# Patient Record
Sex: Female | Born: 1955 | Race: White | Hispanic: No | Marital: Married | Smoking: Current every day smoker
Health system: Southern US, Community
[De-identification: ages and names within clinical notes are randomized; demographics above are authoritative.]

## PROBLEM LIST (undated history)

## (undated) DIAGNOSIS — D649 Anemia, unspecified: Secondary | ICD-10-CM

## (undated) DIAGNOSIS — F329 Major depressive disorder, single episode, unspecified: Secondary | ICD-10-CM

## (undated) DIAGNOSIS — E7800 Pure hypercholesterolemia, unspecified: Secondary | ICD-10-CM

## (undated) DIAGNOSIS — I509 Heart failure, unspecified: Secondary | ICD-10-CM

## (undated) DIAGNOSIS — F32A Depression, unspecified: Secondary | ICD-10-CM

## (undated) DIAGNOSIS — Z5189 Encounter for other specified aftercare: Secondary | ICD-10-CM

## (undated) DIAGNOSIS — T7840XA Allergy, unspecified, initial encounter: Secondary | ICD-10-CM

## (undated) DIAGNOSIS — K219 Gastro-esophageal reflux disease without esophagitis: Secondary | ICD-10-CM

## (undated) DIAGNOSIS — J439 Emphysema, unspecified: Secondary | ICD-10-CM

## (undated) DIAGNOSIS — H269 Unspecified cataract: Secondary | ICD-10-CM

## (undated) DIAGNOSIS — Z8489 Family history of other specified conditions: Secondary | ICD-10-CM

## (undated) DIAGNOSIS — F419 Anxiety disorder, unspecified: Secondary | ICD-10-CM

## (undated) DIAGNOSIS — R0600 Dyspnea, unspecified: Secondary | ICD-10-CM

## (undated) DIAGNOSIS — G709 Myoneural disorder, unspecified: Secondary | ICD-10-CM

## (undated) DIAGNOSIS — M1990 Unspecified osteoarthritis, unspecified site: Secondary | ICD-10-CM

## (undated) DIAGNOSIS — J45909 Unspecified asthma, uncomplicated: Secondary | ICD-10-CM

## (undated) HISTORY — DX: Unspecified osteoarthritis, unspecified site: M19.90

## (undated) HISTORY — DX: Unspecified asthma, uncomplicated: J45.909

## (undated) HISTORY — DX: Allergy, unspecified, initial encounter: T78.40XA

## (undated) HISTORY — DX: Anemia, unspecified: D64.9

## (undated) HISTORY — PX: DE QUERVAIN'S RELEASE: SHX1439

## (undated) HISTORY — DX: Myoneural disorder, unspecified: G70.9

## (undated) HISTORY — DX: Unspecified cataract: H26.9

## (undated) HISTORY — DX: Gastro-esophageal reflux disease without esophagitis: K21.9

## (undated) HISTORY — DX: Encounter for other specified aftercare: Z51.89

## (undated) HISTORY — DX: Heart failure, unspecified: I50.9

## (undated) HISTORY — DX: Pure hypercholesterolemia, unspecified: E78.00

## (undated) HISTORY — DX: Emphysema, unspecified: J43.9

## (undated) HISTORY — PX: MOUTH SURGERY: SHX715

## (undated) SURGERY — EGD (ESOPHAGOGASTRODUODENOSCOPY)
Anesthesia: Moderate Sedation

## (undated) SURGERY — COLONOSCOPY
Anesthesia: Moderate Sedation

---
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
## 1981-02-28 HISTORY — PX: TUBAL LIGATION: SHX77

## 1998-07-23 HISTORY — PX: TOTAL ABDOMINAL HYSTERECTOMY W/ BILATERAL SALPINGOOPHORECTOMY: SHX83

## 1998-12-31 ENCOUNTER — Ambulatory Visit (HOSPITAL_BASED_OUTPATIENT_CLINIC_OR_DEPARTMENT_OTHER): Admission: RE | Admit: 1998-12-31 | Discharge: 1998-12-31 | Payer: Self-pay | Admitting: Orthopedic Surgery

## 2003-04-22 HISTORY — PX: TOE SURGERY: SHX1073

## 2004-07-15 ENCOUNTER — Encounter: Admission: RE | Admit: 2004-07-15 | Discharge: 2004-07-15 | Payer: Self-pay | Admitting: Orthopedic Surgery

## 2005-08-18 ENCOUNTER — Ambulatory Visit: Payer: Self-pay | Admitting: Cardiology

## 2005-08-22 ENCOUNTER — Ambulatory Visit: Payer: Self-pay | Admitting: Cardiology

## 2006-06-24 ENCOUNTER — Ambulatory Visit (HOSPITAL_BASED_OUTPATIENT_CLINIC_OR_DEPARTMENT_OTHER): Admission: RE | Admit: 2006-06-24 | Discharge: 2006-06-24 | Payer: Self-pay | Admitting: Orthopedic Surgery

## 2006-06-24 ENCOUNTER — Encounter (INDEPENDENT_AMBULATORY_CARE_PROVIDER_SITE_OTHER): Payer: Self-pay | Admitting: *Deleted

## 2008-08-21 ENCOUNTER — Encounter: Admission: RE | Admit: 2008-08-21 | Discharge: 2008-08-21 | Payer: Self-pay | Admitting: Surgery

## 2009-06-21 HISTORY — PX: OTHER SURGICAL HISTORY: SHX169

## 2009-09-13 ENCOUNTER — Encounter: Admission: RE | Admit: 2009-09-13 | Discharge: 2009-09-13 | Payer: Self-pay | Admitting: Orthopedic Surgery

## 2009-09-19 ENCOUNTER — Ambulatory Visit: Payer: Self-pay | Admitting: Cardiology

## 2010-01-11 ENCOUNTER — Encounter: Admission: RE | Admit: 2010-01-11 | Discharge: 2010-01-11 | Payer: Self-pay | Admitting: Otolaryngology

## 2010-05-10 ENCOUNTER — Ambulatory Visit
Admission: RE | Admit: 2010-05-10 | Discharge: 2010-05-10 | Payer: Self-pay | Source: Home / Self Care | Attending: Urology | Admitting: Urology

## 2010-05-10 HISTORY — PX: OTHER SURGICAL HISTORY: SHX169

## 2010-05-13 ENCOUNTER — Encounter: Payer: Self-pay | Admitting: Surgery

## 2010-05-13 LAB — POCT HEMOGLOBIN-HEMACUE: Hemoglobin: 16.3 g/dL — ABNORMAL HIGH (ref 12.0–15.0)

## 2010-05-17 NOTE — Op Note (Signed)
  NAME:  HENDEL, GATLIFF NO.:  [id number]  MEDICAL RECORD NO.:  [id number]          PATIENT TYPE:  AMB  LOCATION:  [facility]                         FACILITY:  [facility]  PHYSICIAN:  Maretta Bees. Vonita Moss, M.D.DATE OF BIRTH:  Jul 06, 1955  DATE OF PROCEDURE:  05/10/2010 DATE OF DISCHARGE:                              OPERATIVE REPORT   PREOPERATIVE DIAGNOSIS:  Rule out interstitial cystitis.  POSTOPERATIVE DIAGNOSIS:  Interstitial cystitis.  PROCEDURE:  Cystoscopy, HOD, and cold cup bladder biopsy.  SURGEON:  Maretta Bees. Vonita Moss, M.D.  ANESTHESIA:  General.  INDICATIONS:  This 55 year old lady has a long history of frequency and urgency of urination.  The symptoms have become worse in the last few years.  Her pelvic pain and symptom score was 22.  She has not responded to anticholinergics.  I am suspicious of interstitial cystitis.  She was advised about postop pain, bleeding, and rare risk of bladder rupture.  DESCRIPTION OF PROCEDURE:  The patient was brought to the operating room and placed in lithotomy position.  External genitalia were prepped and draped in the usual fashion.  She was cystoscoped and the bladder was unremarkable.  She was filled to 500 cc and looking back in she had scattered submucosal petechiae and hemorrhage consistent with interstitial cystitis.  Cold cup bladder biopsies were taken from 4 areas with typical hemorrhagic lesions.  The biopsy sites were fulgurated with the Bugbee electrode.  At this point, the bladder was emptied and the scope removed and the patient was sent to recovery room in good condition having tolerated the procedure well.     Maretta Bees. Vonita Moss, M.D.     LJP/MEDQ  D:  05/10/2010  T:  05/10/2010  Job:  [id number]  Electronically Signed by Larey Dresser M.D. on 05/15/2010 11:35:22 AM

## 2010-09-06 NOTE — Op Note (Signed)
NAME:  Yvette Curry, Yvette Curry NO.:  [id number]   MEDICAL RECORD NO.:  [id number]          PATIENT TYPE:  AMB   LOCATION:  [facility]                          FACILITY:  [facility]   PHYSICIAN:  Artist Pais. 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   PROCEDURE:  Left wrist first dorsal compartment release with excision of  left wrist dorsal mass.   SURGEON:  Weingold.   ASSISTANT:  None.   ANESTHESIA:  Regional.   TOURNIQUET TIME:  35 minutes.   COMPLICATIONS:  None.   DRAINS:  None.   OPERATIVE REPORT:  The patient was taken to the operating suite.  After  the induction of adequate IV regional anesthetic, the left upper  extremity was prepped and draped in the usual sterile fashion.  Once  this was done, a JC incision was made over the first dorsal compartment  extending distally to the midline over a palpable mass of the area of  Lister tubercle.  The skin was incised.  Flaps were raised accordingly.  The first dorsal compartment was identified.  It was incised at its  dorsal-most extent after careful __________ retracting the superficial  radial nerve.  After this was completed, dissection was carried down to  the radial aspect where a small lesion was seen coming off the EPL  sheath in the area of Lister tubercle.  This was excised.  The wound was  irrigated and loosely closed with 3-0 Prolene subcuticular stitch.  Steri-Strips, 4 x 4s, fluffs, compressive dressing, and radial gutter  splint was applied.  The patient tolerated the above procedure well and  was returned to recovery room in stable fashion.      Artist Pais Mina Marble, M.D.  Electronically Signed     MAW/MEDQ  D:  06/24/2006  T:  06/24/2006  Job:  [id number]

## 2010-11-05 ENCOUNTER — Encounter: Payer: Self-pay | Admitting: Pulmonary Disease

## 2010-11-06 ENCOUNTER — Ambulatory Visit (INDEPENDENT_AMBULATORY_CARE_PROVIDER_SITE_OTHER): Payer: 59 | Admitting: Pulmonary Disease

## 2010-11-06 ENCOUNTER — Encounter: Payer: Self-pay | Admitting: Pulmonary Disease

## 2010-11-06 VITALS — BP 136/92 | HR 110 | Temp 97.8°F | Ht 61.0 in | Wt 172.0 lb

## 2010-11-06 DIAGNOSIS — R911 Solitary pulmonary nodule: Secondary | ICD-10-CM | POA: Insufficient documentation

## 2010-11-06 DIAGNOSIS — J984 Other disorders of lung: Secondary | ICD-10-CM

## 2010-11-06 DIAGNOSIS — R05 Cough: Secondary | ICD-10-CM

## 2010-11-06 DIAGNOSIS — R059 Cough, unspecified: Secondary | ICD-10-CM | POA: Insufficient documentation

## 2010-11-06 NOTE — Progress Notes (Signed)
  Subjective:    Patient ID: Yvette Curry, female    DOB: 1955/05/26, 55 y.o.   MRN: [id number]  HPI The pt is a 54y/o female who comes in as a self referral for management of cough.  She states she has had a cough for 3-62mos, and primarily occurs at night on lying down.  It also occurs during the night, and can awaken her from sleep.  She denies any postnasal drip, but does have breakthru reflux symptoms on PPI qday.  She has been seen by ENT, and tells me she has had a negative upper airway exam.  She denies chronic throat clearing, but does feel that food sometimes gets stuck in her throat.  She has had a chest ct last year which showed a 3mm nodule, but no other pulmonary abnormality.  She is due for a f/u scan.  She does have a long h/o smoking, and continues to do so.  She has never had pfts.   Review of Systems  Constitutional: Negative for fever and unexpected weight change.  HENT: Positive for trouble swallowing. Negative for ear pain, nosebleeds, congestion, sore throat, rhinorrhea, sneezing, dental problem, postnasal drip and sinus pressure.   Eyes: Negative for redness and itching.  Respiratory: Positive for cough and shortness of breath. Negative for chest tightness and wheezing.   Cardiovascular: Negative for palpitations and leg swelling.  Gastrointestinal: Negative for nausea and vomiting.  Genitourinary: Negative for dysuria.  Musculoskeletal: Negative for joint swelling.  Skin: Negative for rash.  Neurological: Negative for headaches.  Hematological: Does not bruise/bleed easily.  Psychiatric/Behavioral: Negative for dysphoric mood. The patient is not nervous/anxious.        Objective:   Physical Exam Constitutional:  Obese female, no acute distress  HENT:  Nares patent without discharge  Oropharynx without exudate, palate and uvula are normal  Eyes:  Perrla, eomi, no scleral icterus  Neck:  No JVD, no TMG  Cardiovascular:  Normal rate, regular rhythm, no rubs or  gallops.  No murmurs        Intact distal pulses  Pulmonary :  Normal breath sounds, no stridor or respiratory distress   No rales, rhonchi, or wheezing  Abdominal:  Soft, nondistended, bowel sounds present.  No tenderness noted.   Musculoskeletal:  No lower extremity edema noted.  Lymph Nodes:  No cervical lymphadenopathy noted  Skin:  No cyanosis noted  Neurologic:  Alert, appropriate, moves all 4 extremities without obvious deficit.         Assessment & Plan:

## 2010-11-06 NOTE — Patient Instructions (Signed)
Will schedule for repeat ct chest to followup your nodule.  Will call with results. Stop smoking! This is very important.   Increase protonix to twice a day dosing for next 3 weeks, am and pm. Consider adding chlorpheniramine 8mg  (over the counter) at bedtime for next few weeks to also treat postnasal drip/allergies. Call me in 3 weeks with how you have responded to the above changes.

## 2010-11-09 ENCOUNTER — Encounter: Payer: Self-pay | Admitting: Pulmonary Disease

## 2010-11-09 NOTE — Assessment & Plan Note (Signed)
The pt's cough is most suggestive of an upper airway source, with LPR being the most likely.  I also think her ongoing smoking aggravates things.  I would like to treat her more aggressively for reflux, and also encouraged her to work hard on smoking cessation.  She will also need a f/u ct chest for her nodule.

## 2010-11-28 ENCOUNTER — Telehealth: Payer: Self-pay | Admitting: Pulmonary Disease

## 2010-11-28 NOTE — Telephone Encounter (Signed)
KC, have you seen CT Chest results yet? It was done at [facility]. Please advise, thanks!

## 2010-11-29 NOTE — Telephone Encounter (Signed)
Let her know that her spot in top of left lung that we were watching the most has decreased in size. The other tiny spots have not changed.  The radiologist has recommended one more followup in one year.  I concur.  I will put a reminder in the computer, but she needs to call us this time next year for f/u.  See if her cough is better.

## 2010-11-29 NOTE — Telephone Encounter (Signed)
I called and asked to get results faxed to triage fax. Will await fax.Carron Curie, CMA

## 2010-11-29 NOTE — Telephone Encounter (Signed)
Form is in [facility] look-at. Carron Curie, CMA

## 2010-11-29 NOTE — Telephone Encounter (Signed)
Have not seen it.  Those do not come into epic, and we typically have to wait until sent by mail or faxed.  To expedite things, please have them fax over the report and give to me today to call pt.  Thanks.

## 2010-12-02 NOTE — Telephone Encounter (Signed)
Spoke with Yvette Curry and she is aware of ct results. Yvette Curry verbalized understanding and had no questions. Yvette Curry states her cough is no better. Yvette Curry states she is getting up very little phlem and it ranges in color from clear to tan. Yvette Curry has been taking otc chlorpheniramine and it has not helped any. Please advise Dr. Shelle Iron. Thanks

## 2010-12-02 NOTE — Telephone Encounter (Signed)
See if she is still smoking. If she still is, let her know that I am happy to see her again to address cough, but the usual first order of business is to stop smoking.  The cough may not ever go away until she does so.

## 2010-12-02 NOTE — Telephone Encounter (Signed)
Pt is still smoking and says she is not ready to stop. She was made aware that per Dr. Shelle Iron her cough may not ever go away until she takes that step. She did not want to make an appointment at this time and I will forward to [facility] so he is aware.

## 2010-12-03 ENCOUNTER — Encounter: Payer: Self-pay | Admitting: Pulmonary Disease

## 2013-07-08 ENCOUNTER — Other Ambulatory Visit: Payer: Self-pay | Admitting: Endocrinology

## 2013-07-08 DIAGNOSIS — R1310 Dysphagia, unspecified: Secondary | ICD-10-CM

## 2013-07-18 ENCOUNTER — Ambulatory Visit
Admission: RE | Admit: 2013-07-18 | Discharge: 2013-07-18 | Disposition: A | Discharge status: Home / Self Care | Payer: 59 | Source: Ambulatory Visit | Attending: Endocrinology | Admitting: Endocrinology

## 2013-07-18 ENCOUNTER — Other Ambulatory Visit: Payer: Self-pay | Admitting: Endocrinology

## 2013-07-18 DIAGNOSIS — R1310 Dysphagia, unspecified: Secondary | ICD-10-CM

## 2013-07-18 MED ORDER — IOHEXOL 300 MG/ML  SOLN
75.0000 mL | Freq: Once | INTRAMUSCULAR | Status: AC | PRN
  Administered 2013-07-18: 75 mL via INTRAVENOUS

## 2014-06-13 ENCOUNTER — Encounter (INDEPENDENT_AMBULATORY_CARE_PROVIDER_SITE_OTHER): Payer: Self-pay

## 2014-06-13 ENCOUNTER — Encounter (INDEPENDENT_AMBULATORY_CARE_PROVIDER_SITE_OTHER): Payer: Self-pay | Admitting: *Deleted

## 2014-11-06 DIAGNOSIS — Z720 Tobacco use: Secondary | ICD-10-CM | POA: Insufficient documentation

## 2016-05-12 DIAGNOSIS — R1314 Dysphagia, pharyngoesophageal phase: Secondary | ICD-10-CM | POA: Diagnosis not present

## 2016-05-12 DIAGNOSIS — R0602 Shortness of breath: Secondary | ICD-10-CM | POA: Diagnosis not present

## 2016-05-12 DIAGNOSIS — Z720 Tobacco use: Secondary | ICD-10-CM | POA: Diagnosis not present

## 2016-05-19 ENCOUNTER — Encounter (INDEPENDENT_AMBULATORY_CARE_PROVIDER_SITE_OTHER): Payer: Self-pay | Admitting: Internal Medicine

## 2016-05-19 ENCOUNTER — Encounter (INDEPENDENT_AMBULATORY_CARE_PROVIDER_SITE_OTHER): Payer: Self-pay

## 2016-05-27 ENCOUNTER — Encounter (INDEPENDENT_AMBULATORY_CARE_PROVIDER_SITE_OTHER): Payer: Self-pay | Admitting: Internal Medicine

## 2016-05-27 ENCOUNTER — Ambulatory Visit (INDEPENDENT_AMBULATORY_CARE_PROVIDER_SITE_OTHER): Payer: 59 | Admitting: Internal Medicine

## 2016-05-27 ENCOUNTER — Telehealth (INDEPENDENT_AMBULATORY_CARE_PROVIDER_SITE_OTHER): Payer: Self-pay | Admitting: *Deleted

## 2016-05-27 ENCOUNTER — Other Ambulatory Visit (INDEPENDENT_AMBULATORY_CARE_PROVIDER_SITE_OTHER): Payer: Self-pay | Admitting: Internal Medicine

## 2016-05-27 ENCOUNTER — Encounter (INDEPENDENT_AMBULATORY_CARE_PROVIDER_SITE_OTHER): Payer: Self-pay | Admitting: *Deleted

## 2016-05-27 VITALS — BP 120/72 | HR 64 | Temp 98.2°F | Ht 61.0 in | Wt 158.2 lb

## 2016-05-27 DIAGNOSIS — R1319 Other dysphagia: Secondary | ICD-10-CM

## 2016-05-27 DIAGNOSIS — R1310 Dysphagia, unspecified: Secondary | ICD-10-CM | POA: Diagnosis not present

## 2016-05-27 DIAGNOSIS — Z860100 Personal history of colon polyps, unspecified: Secondary | ICD-10-CM

## 2016-05-27 DIAGNOSIS — K219 Gastro-esophageal reflux disease without esophagitis: Secondary | ICD-10-CM

## 2016-05-27 MED ORDER — PANTOPRAZOLE SODIUM 40 MG PO TBEC: 40.0000 mg | DELAYED_RELEASE_TABLET | Freq: Every day | ORAL | 3 refills | Status: DC

## 2016-05-27 MED ORDER — PEG 3350-KCL-NA BICARB-NACL 420 G PO SOLR: 4000.0000 mL | Freq: Once | ORAL | 0 refills | Status: AC
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
## 2016-05-27 NOTE — Telephone Encounter (Signed)
Patient needs trilyte 

## 2016-05-27 NOTE — Patient Instructions (Addendum)
EGD/ED.  The risks and benefits such as perforation, bleeding, and infection were reviewed with the patient and is agreeable. Colonoscopy.

## 2016-05-28 DIAGNOSIS — R1310 Dysphagia, unspecified: Secondary | ICD-10-CM | POA: Insufficient documentation

## 2016-05-28 DIAGNOSIS — R1319 Other dysphagia: Secondary | ICD-10-CM | POA: Insufficient documentation

## 2016-05-28 DIAGNOSIS — Z860100 Personal history of colon polyps, unspecified: Secondary | ICD-10-CM | POA: Insufficient documentation

## 2016-05-30 ENCOUNTER — Ambulatory Visit (INDEPENDENT_AMBULATORY_CARE_PROVIDER_SITE_OTHER): Payer: Self-pay | Admitting: Internal Medicine

## 2016-06-05 ENCOUNTER — Telehealth (INDEPENDENT_AMBULATORY_CARE_PROVIDER_SITE_OTHER): Payer: Self-pay | Admitting: *Deleted

## 2016-06-05 NOTE — Telephone Encounter (Signed)
Spoke with patient.  Ann, you may need to ask Dr. Laural Golden if she is going to need an antibiotic

## 2016-06-05 NOTE — Telephone Encounter (Signed)
Patient is having TCS/EGD 07/10/16 -- she is having dental imlpants on 06/16/16 -- will she need antibiotic prior to procedure -- please advise

## 2016-06-05 NOTE — Telephone Encounter (Signed)
Forwarding back to [employer]

## 2016-06-05 NOTE — Telephone Encounter (Signed)
This is for Dr. Rehman 

## 2016-06-05 NOTE — Telephone Encounter (Addendum)
Please call patient, she has questions about having EGD 07/10/16 since she is having dental imlpants on 06/16/16  Ph# [phone]

## 2016-06-05 NOTE — Telephone Encounter (Signed)
Not unless recommended by her dental surgeon.

## 2016-06-06 NOTE — Telephone Encounter (Signed)
Patient aware, she will talk to dental surgeon and lets [place] know what they recommend

## 2016-06-24 ENCOUNTER — Telehealth (INDEPENDENT_AMBULATORY_CARE_PROVIDER_SITE_OTHER): Payer: Self-pay | Admitting: *Deleted

## 2016-06-24 NOTE — Telephone Encounter (Signed)
I spoke to patient and she states she isn't allergic to versed just the demerol -- she talked to her dental surgeon and they used versed, fentanyl, dexatrone and Zofran for her oral surgery -- Threasa Beards is aware patient can take versed but not demerol

## 2016-07-01 DIAGNOSIS — Z1231 Encounter for screening mammogram for malignant neoplasm of breast: Secondary | ICD-10-CM | POA: Diagnosis not present

## 2016-07-10 ENCOUNTER — Encounter (HOSPITAL_COMMUNITY)
Admission: RE | Disposition: A | Discharge status: Home / Self Care | Payer: Self-pay | Source: Ambulatory Visit | Attending: Internal Medicine

## 2016-07-10 ENCOUNTER — Ambulatory Visit (HOSPITAL_COMMUNITY)
Admission: RE | Admit: 2016-07-10 | Discharge: 2016-07-10 | Disposition: A | Discharge status: Home / Self Care | Payer: 59 | Source: Ambulatory Visit | Attending: Internal Medicine | Admitting: Internal Medicine

## 2016-07-10 ENCOUNTER — Encounter (HOSPITAL_COMMUNITY): Payer: Self-pay | Admitting: *Deleted

## 2016-07-10 DIAGNOSIS — K449 Diaphragmatic hernia without obstruction or gangrene: Secondary | ICD-10-CM | POA: Diagnosis not present

## 2016-07-10 DIAGNOSIS — K259 Gastric ulcer, unspecified as acute or chronic, without hemorrhage or perforation: Secondary | ICD-10-CM | POA: Insufficient documentation

## 2016-07-10 DIAGNOSIS — F329 Major depressive disorder, single episode, unspecified: Secondary | ICD-10-CM | POA: Diagnosis not present

## 2016-07-10 DIAGNOSIS — F17210 Nicotine dependence, cigarettes, uncomplicated: Secondary | ICD-10-CM | POA: Insufficient documentation

## 2016-07-10 DIAGNOSIS — R1314 Dysphagia, pharyngoesophageal phase: Secondary | ICD-10-CM | POA: Diagnosis not present

## 2016-07-10 DIAGNOSIS — K5730 Diverticulosis of large intestine without perforation or abscess without bleeding: Secondary | ICD-10-CM | POA: Diagnosis not present

## 2016-07-10 DIAGNOSIS — D125 Benign neoplasm of sigmoid colon: Secondary | ICD-10-CM | POA: Diagnosis not present

## 2016-07-10 DIAGNOSIS — Z860100 Personal history of colon polyps, unspecified: Secondary | ICD-10-CM | POA: Insufficient documentation

## 2016-07-10 DIAGNOSIS — K644 Residual hemorrhoidal skin tags: Secondary | ICD-10-CM | POA: Diagnosis not present

## 2016-07-10 DIAGNOSIS — K219 Gastro-esophageal reflux disease without esophagitis: Secondary | ICD-10-CM | POA: Diagnosis not present

## 2016-07-10 DIAGNOSIS — F419 Anxiety disorder, unspecified: Secondary | ICD-10-CM | POA: Diagnosis not present

## 2016-07-10 DIAGNOSIS — Z1211 Encounter for screening for malignant neoplasm of colon: Secondary | ICD-10-CM | POA: Diagnosis not present

## 2016-07-10 DIAGNOSIS — R1310 Dysphagia, unspecified: Secondary | ICD-10-CM

## 2016-07-10 DIAGNOSIS — Q394 Esophageal web: Secondary | ICD-10-CM | POA: Insufficient documentation

## 2016-07-10 DIAGNOSIS — Z79899 Other long term (current) drug therapy: Secondary | ICD-10-CM | POA: Diagnosis not present

## 2016-07-10 DIAGNOSIS — K6289 Other specified diseases of anus and rectum: Secondary | ICD-10-CM | POA: Diagnosis not present

## 2016-07-10 DIAGNOSIS — K3189 Other diseases of stomach and duodenum: Secondary | ICD-10-CM | POA: Diagnosis not present

## 2016-07-10 DIAGNOSIS — R1319 Other dysphagia: Secondary | ICD-10-CM | POA: Insufficient documentation

## 2016-07-10 HISTORY — DX: Dyspnea, unspecified: R06.00

## 2016-07-10 HISTORY — PX: BIOPSY: SHX5522

## 2016-07-10 HISTORY — PX: POLYPECTOMY: SHX5525

## 2016-07-10 HISTORY — PX: ESOPHAGEAL DILATION: SHX303

## 2016-07-10 HISTORY — DX: Anxiety disorder, unspecified: F41.9

## 2016-07-10 HISTORY — PX: ESOPHAGOGASTRODUODENOSCOPY: SHX5428

## 2016-07-10 HISTORY — DX: Depression, unspecified: F32.A

## 2016-07-10 HISTORY — DX: Major depressive disorder, single episode, unspecified: F32.9

## 2016-07-10 HISTORY — PX: COLONOSCOPY: SHX5424

## 2016-07-10 SURGERY — EGD (ESOPHAGOGASTRODUODENOSCOPY)
Anesthesia: Moderate Sedation

## 2016-07-10 MED ORDER — PROMETHAZINE HCL 25 MG/ML IJ SOLN
INTRAMUSCULAR | Status: DC | PRN
  Administered 2016-07-10: 12.5 mg via INTRAVENOUS

## 2016-07-10 MED ORDER — MIDAZOLAM HCL 5 MG/5ML IJ SOLN
INTRAMUSCULAR | Status: DC | PRN
  Administered 2016-07-10: 2 mg via INTRAVENOUS
  Administered 2016-07-10 (×2): 1 mg via INTRAVENOUS
  Administered 2016-07-10: 2 mg via INTRAVENOUS
  Administered 2016-07-10 (×2): 1 mg via INTRAVENOUS
  Administered 2016-07-10: 2 mg via INTRAVENOUS

## 2016-07-10 MED ORDER — MEPERIDINE HCL 50 MG/ML IJ SOLN
INTRAMUSCULAR | Status: AC
  Filled 2016-07-10: qty 1

## 2016-07-10 MED ORDER — BUTAMBEN-TETRACAINE-BENZOCAINE 2-2-14 % EX AERO
INHALATION_SPRAY | CUTANEOUS | Status: DC | PRN
  Administered 2016-07-10: 1 via TOPICAL

## 2016-07-10 MED ORDER — PROMETHAZINE HCL 25 MG/ML IJ SOLN
INTRAMUSCULAR | Status: AC
  Filled 2016-07-10: qty 1

## 2016-07-10 MED ORDER — MEPERIDINE HCL 50 MG/ML IJ SOLN: INTRAMUSCULAR | Status: DC | PRN

## 2016-07-10 MED ORDER — SODIUM CHLORIDE 0.9 % IV SOLN
INTRAVENOUS | Status: DC
  Administered 2016-07-10: 12:00:00 via INTRAVENOUS

## 2016-07-10 MED ORDER — BUTAMBEN-TETRACAINE-BENZOCAINE 2-2-14 % EX AERO
INHALATION_SPRAY | CUTANEOUS | Status: AC
  Filled 2016-07-10: qty 20

## 2016-07-10 MED ORDER — FENTANYL CITRATE (PF) 100 MCG/2ML IJ SOLN
INTRAMUSCULAR | Status: DC | PRN
  Administered 2016-07-10 (×3): 25 ug via INTRAVENOUS

## 2016-07-10 MED ORDER — FENTANYL CITRATE (PF) 100 MCG/2ML IJ SOLN
INTRAMUSCULAR | Status: AC
  Filled 2016-07-10: qty 2

## 2016-07-10 MED ORDER — HYDROCODONE-ACETAMINOPHEN 7.5-325 MG/15ML PO SOLN: 10.0000 mL | Freq: Four times a day (QID) | ORAL | 0 refills | Status: DC | PRN

## 2016-07-10 MED ORDER — MIDAZOLAM HCL 5 MG/5ML IJ SOLN
INTRAMUSCULAR | Status: AC
  Filled 2016-07-10: qty 10

## 2016-07-10 MED ORDER — SODIUM CHLORIDE 0.9% FLUSH
INTRAVENOUS | Status: AC
  Filled 2016-07-10: qty 10

## 2016-07-10 NOTE — Discharge Instructions (Signed)
NO Aspirin or NSAIDs for 3 days. Resume other medications as before. Hydrocodone/acetaminophen liquid 2 teaspoonful up to 4 times a day as needed. Mechanical soft diet for 24 hours and then usual diet. No driving for 24 hours. Physician will call with biopsy results.   Colonoscopy, Adult, Care After This sheet gives you information about how to care for yourself after your procedure. Your health care provider may also give you more specific instructions. If you have problems or questions, contact your health care provider. Dr Laural Golden [phone] (after hours call the hospital and have the GI doctor on call paged) What can I expect after the procedure? After the procedure, it is common to have:  A small amount of blood in your stool for 24 hours after the procedure.  Some gas.  Mild abdominal cramping or bloating. Follow these instructions at home: General instructions    For the first 24 hours after the procedure:  Do not drive or use machinery.  Do not sign important documents.  Do not drink alcohol.  Do your regular daily activities at a slower pace than normal.  Eat soft, easy-to-digest foods.  Rest often.  Take over-the-counter or prescription medicines only as told by your health care provider.  It is up to you to get the results of your procedure. Ask your health care provider, or the department performing the procedure, when your results will be ready. Relieving cramping and bloating   Try walking around when you have cramps or feel bloated. Eating and drinking   Drink enough fluid to keep your urine clear or pale yellow.  Resume your normal diet as instructed by your health care provider. Avoid heavy or fried foods that are hard to digest. Contact a health care provider if:  You have blood in your stool 2-3 days after the procedure. Get help right away if:  You have more than a small spotting of blood in your stool.  You pass large blood clots in your  stool.  Your abdomen is swollen.  You have nausea or vomiting.  You have a fever.  You have increasing abdominal pain that is not relieved with medicine. This information is not intended to replace advice given to you by your health care provider. Make sure you discuss any questions you have with your health care provider. Document Released: 11/20/2003 Document Revised: 12/31/2015 Document Reviewed: 06/19/2015 Elsevier Interactive Patient Education  2017 [place].  Esophagogastroduodenoscopy, Care After Refer to this sheet in the next few weeks. These instructions provide you with information about caring for yourself after your procedure. Your health care provider may also give you more specific instructions. Your treatment has been planned according to current medical practices, but problems sometimes occur. Call your health care provider if you have any problems or questions after your procedure. What can I expect after the procedure? After the procedure, it is common to have:  A sore throat.  Nausea.  Bloating.  Dizziness.  Fatigue. Follow these instructions at home:  Do not eat or drink anything until the numbing medicine (local anesthetic) has worn off and your gag reflex has returned. You will know that the local anesthetic has worn off when you can swallow comfortably.  Do not drive for 24 hours if you received a medicine to help you relax (sedative).  If your health care provider took a tissue sample for testing during the procedure, make sure to get your test results. This is your responsibility. Ask your health care provider or  the department performing the test when your results will be ready.  Keep all follow-up visits as told by your health care provider. This is important. Contact a health care provider if:  You cannot stop coughing.  You are not urinating.  You are urinating less than usual. Get help right away if:  You have trouble swallowing.  You  cannot eat or drink.  You have throat or chest pain that gets worse.  You are dizzy or light-headed.  You faint.  You have nausea or vomiting.  You have chills.  You have a fever.  You have severe abdominal pain.  You have black, tarry, or bloody stools. This information is not intended to replace advice given to you by your health care provider. Make sure you discuss any questions you have with your health care provider. Document Released: 03/24/2012 Document Revised: 09/13/2015 Document Reviewed: 03/01/2015 Elsevier Interactive Patient Education  2017 [place] -  For the first 24 hours A soft-food meal plan includes foods that are safe and easy to swallow. This meal plan typically is used:  If you are having trouble chewing or swallowing foods.  As a transition meal plan after only having had liquid meals for a long period. What do I need to know about the soft-food meal plan? A soft-food meal plan includes tender foods that are soft and easy to chew and swallow. In most cases, bite-sized pieces of food are easier to swallow. A bite-sized piece is about  inch or smaller. Foods in this plan do not need to be ground or pureed. Foods that are very hard, crunchy, or sticky should be avoided. Also, breads, cereals, yogurts, and desserts with nuts, seeds, or fruits should be avoided. What foods can I eat? Grains  Rice and wild rice. Moist bread, dressing, pasta, and noodles. Well-moistened dry or cooked cereals, such as farina (cooked wheat cereal), oatmeal, or grits. Biscuits, breads, muffins, pancakes, and waffles that have been well moistened. Vegetables  Shredded lettuce. Cooked, tender vegetables, including potatoes without skins. Vegetable juices. Broths or creamed soups made with vegetables that are not stringy or chewy. Strained tomatoes (without seeds). Fruits  Canned or well-cooked fruits. Soft (ripe), peeled fresh fruits, such as peaches,  nectarines, kiwi, cantaloupe, honeydew melon, and watermelon (without seeds). Soft berries with small seeds, such as strawberries. Fruit juices (without pulp). Meats and Other Protein Sources  Moist, tender, lean beef. Mutton. Lamb. Veal. Chicken. [place]. Liver. Ham. Fish without bones. Eggs. Dairy  Milk, milk drinks, and cream. Plain cream cheese and cottage cheese. Plain yogurt. Sweets/Desserts  Flavored gelatin desserts. Custard. Plain ice cream, frozen yogurt, sherbet, milk shakes, and malts. Plain cakes and cookies. Plain hard candy. Other  Butter, margarine (without trans fat), and cooking oils. Mayonnaise. Cream sauces. Mild spices, salt, and sugar. Syrup, molasses, honey, and jelly. The items listed above may not be a complete list of recommended foods or beverages. Contact your dietitian for more options.  What foods are not recommended? Grains  Dry bread, toast, crackers that have not been moistened. Coarse or dry cereals, such as bran, granola, and shredded wheat. Tough or chewy crusty breads, such as [place] bread or baguettes. Vegetables  Corn. Raw vegetables except shredded lettuce. Cooked vegetables that are tough or stringy. Tough, crisp, fried potatoes and potato skins. Fruits  Fresh fruits with skins or seeds or both, such as apples, pears, or grapes. Stringy, high-pulp fruits, such as papaya, pineapple, coconut, or mango. Fruit  leather, fruit roll-ups, and all dried fruits. Meats and Other Protein Sources  Sausages and hot dogs. Meats with gristle. Fish with bones. Nuts, seeds, and chunky peanut or other nut butters. Sweets/Desserts  Cakes or cookies that are very dry or chewy. The items listed above may not be a complete list of foods and beverages to avoid. Contact your dietitian for more information.  This information is not intended to replace advice given to you by your health care provider. Make sure you discuss any questions you have with your health care  provider. Document Released: 07/15/2007 Document Revised: 09/13/2015 Document Reviewed: 03/04/2013 Elsevier Interactive Patient Education  2017 [place].   High-Fiber Diet Fiber, also called dietary fiber, is a type of carbohydrate found in fruits, vegetables, whole grains, and beans. A high-fiber diet can have many health benefits. Your health care provider may recommend a high-fiber diet to help:  Prevent constipation. Fiber can make your bowel movements more regular.  Lower your cholesterol.  Relieve hemorrhoids, uncomplicated diverticulosis, or irritable bowel syndrome.  Prevent overeating as part of a weight-loss plan.  Prevent heart disease, type 2 diabetes, and certain cancers. What is my plan? The recommended daily intake of fiber includes:  38 grams for men under age 42.  48 grams for men over age 87.  65 grams for women under age 85.  9 grams for women over age 32. You can get the recommended daily intake of dietary fiber by eating a variety of fruits, vegetables, grains, and beans. Your health care provider may also recommend a fiber supplement if it is not possible to get enough fiber through your diet. What do I need to know about a high-fiber diet?  Fiber supplements have not been widely studied for their effectiveness, so it is better to get fiber through food sources.  Always check the fiber content on thenutrition facts label of any prepackaged food. Look for foods that contain at least 5 grams of fiber per serving.  Ask your dietitian if you have questions about specific foods that are related to your condition, especially if those foods are not listed in the following section.  Increase your daily fiber consumption gradually. Increasing your intake of dietary fiber too quickly may cause bloating, cramping, or gas.  Drink plenty of water. Water helps you to digest fiber. What foods can I eat? Grains  Whole-grain breads. Multigrain cereal. Oats and  oatmeal. Brown rice. Barley. Bulgur wheat. [place]. Bran muffins. Popcorn. Rye wafer crackers. Vegetables  Sweet potatoes. Spinach. Kale. Artichokes. Cabbage. Broccoli. Green peas. Carrots. Squash. Fruits  Berries. Pears. Apples. Oranges. Avocados. Prunes and raisins. Dried figs. Meats and Other Protein Sources  [place], kidney, pinto, and soy beans. Split peas. Lentils. Nuts and seeds. Dairy  Fiber-fortified yogurt. Beverages  Fiber-fortified soy milk. Fiber-fortified orange juice. Other  Fiber bars. The items listed above may not be a complete list of recommended foods or beverages. Contact your dietitian for more options.  What foods are not recommended? Grains  White bread. Pasta made with refined flour. White rice. Vegetables  Fried potatoes. Canned vegetables. Well-cooked vegetables. Fruits  Fruit juice. Cooked, strained fruit. Meats and Other Protein Sources  Fatty cuts of meat. Fried Sales executive or fried fish. Dairy  Milk. Yogurt. Cream cheese. Sour cream. Beverages  Soft drinks. Other  Cakes and pastries. Butter and oils. The items listed above may not be a complete list of foods and beverages to avoid. Contact your dietitian for more information.  What are some  tips for including high-fiber foods in my diet?  Eat a wide variety of high-fiber foods.  Make sure that half of all grains consumed each day are whole grains.  Replace breads and cereals made from refined flour or white flour with whole-grain breads and cereals.  Replace white rice with brown rice, bulgur wheat, or millet.  Start the day with a breakfast that is high in fiber, such as a cereal that contains at least 5 grams of fiber per serving.  Use beans in place of meat in soups, salads, or pasta.  Eat high-fiber snacks, such as berries, raw vegetables, nuts, or popcorn. This information is not intended to replace advice given to you by your health care provider. Make sure you discuss any questions you have  with your health care provider. Document Released: 04/07/2005 Document Revised: 09/13/2015 Document Reviewed: 09/20/2013 Elsevier Interactive Patient Education  2017 [employer].
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
## 2016-07-10 NOTE — Op Note (Signed)
[facility] Patient Name: Yvette Curry Procedure Date: 07/10/2016 12:38 PM MRN: [id number] Date of Birth: 11-13-1955 Attending MD: Hildred Laser , MD CSN: [id number] Age: 61 Admit Type: Outpatient Procedure:                Upper GI endoscopy Indications:              Esophageal dysphagia Providers:                Hildred Laser, MD, Janeece Riggers, RN, Lurline Del, RN Referring MD:             [name], MD Medicines:                Fentanyl 50 micrograms IV, Promethazine 12.5 mg IV,                            Midazolam 8 mg IV Complications:            No immediate complications. Estimated Blood Loss:     Estimated blood loss was minimal. Procedure:                Pre-Anesthesia Assessment:                           - Prior to the procedure, a History and Physical                            was performed, and patient medications and                            allergies were reviewed. The patient's tolerance of                            previous anesthesia was also reviewed. The risks                            and benefits of the procedure and the sedation                            options and risks were discussed with the patient.                            All questions were answered, and informed consent                            was obtained. Prior Anticoagulants: The patient                            last took ibuprofen 14 days prior to the procedure.                            ASA Grade Assessment: II - A patient with mild                            systemic disease. After reviewing the risks and  benefits, the patient was deemed in satisfactory                            condition to undergo the procedure.                           After obtaining informed consent, the endoscope was                            passed under direct vision. Throughout the                            procedure, the patient's blood pressure, pulse, and                  oxygen saturations were monitored continuously. The                            [id number] ([id number]) scope was introduced through the                            mouth, and advanced to the second part of duodenum.                            The upper GI endoscopy was somewhat difficult due                            to the patient's inability to cooperate. Successful                            completion of the procedure was aided by increasing                            the dose of sedation medication. The patient                            tolerated the procedure well. Scope In: 1:12:57 PM Scope Out: 1:33:56 PM Total Procedure Duration: 0 hours 20 minutes 59 seconds  Findings:      A web was found in the proximal esophagus. The scope was withdrawn.       Dilation was performed with a Maloney dilator with mild resistance at 96       Fr. The dilation site was examined following endoscope reinsertion and       showed moderate improvement in luminal narrowing. Estimated blood loss       was minimal.      The examined esophagus was normal. Biopsies were taken with a cold       forceps for histology.      A 2 cm hiatal hernia was present.      A few dispersed erosions were found in the gastric antrum and in the       prepyloric region of the stomach. Biopsies were taken with a cold       forceps for histology.      The exam of the stomach was otherwise normal.      The duodenal bulb and second portion of  the duodenum were normal. 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                           - Normal esophagus. Biopsied.                           - 2 cm hiatal hernia.                           - Erosive gastropathy. Biopsied.                           - Normal duodenal bulb and second portion of the                            duodenum. Moderate Sedation:      Moderate (conscious) sedation was administered by the endoscopy nurse       and  supervised by the endoscopist. The following parameters were       monitored: oxygen saturation, heart rate, blood pressure, CO2       capnography and response to care. Total physician intraservice time was       25 minutes. Recommendation:           - Patient has a contact number available for                            emergencies. The signs and symptoms of potential                            delayed complications were discussed with the                            patient. Return to normal activities tomorrow.                            Written discharge instructions were provided to the                            patient.                           - Mechanical soft diet today.                           - Resume previous diet for 1 day.                           - Continue present medications.                           - No aspirin, ibuprofen, naproxen, or other                            non-steroidal anti-inflammatory drugs for 3 days.                           -  Hydrocodone/acetaminophen liquid 2 teaspoonful by                            mouth every 6 when necessary.                           - Await pathology results. Procedure Code(s):        --- Professional ---                           [phone], Esophagogastroduodenoscopy, flexible,                            transoral; with biopsy, single or multiple                           43450, Dilation of esophagus, by unguided sound or                            bougie, single or multiple passes                           99152, Moderate sedation services provided by the                            same physician or other qualified health care                            professional performing the diagnostic or                            therapeutic service that the sedation supports,                            requiring the presence of an independent trained                            observer to assist in the monitoring of the                             patient's level of consciousness and physiological                            status; initial 15 minutes of intraservice time,                            patient age 36 years or older                           [phone], Moderate sedation services; each additional                            15 minutes intraservice time Diagnosis Code(s):        --- Professional ---  Q39.4, Esophageal web                           K44.9, Diaphragmatic hernia without obstruction or                            gangrene                           K31.89, Other diseases of stomach and duodenum                           R13.14, Dysphagia, pharyngoesophageal phase CPT copyright 2016 American Medical Association. All rights reserved. The codes documented in this report are preliminary and upon coder review may  be revised to meet current compliance requirements. Hildred Laser, MD Hildred Laser, MD 07/10/2016 2:28:34 PM This report has been signed electronically. Number of Addenda: 0

## 2016-07-10 NOTE — Op Note (Signed)
[facility] Patient Name: Yvette Curry Procedure Date: 07/10/2016 1:36 PM MRN: [id number] Date of Birth: 1955-10-04 Attending MD: Hildred Laser , MD CSN: [id number] Age: 61 Admit Type: Outpatient Procedure:                Colonoscopy Indications:              High risk colon cancer surveillance: Personal                            history of colonic polyps Providers:                Hildred Laser, MD, Janeece Riggers, RN, Lurline Del, RN Referring MD:             [name], MD Medicines:                Fentanyl 25 micrograms IV, Midazolam 2 mg IV Complications:            No immediate complications. Estimated Blood Loss:     Estimated blood loss was minimal. Procedure:                Pre-Anesthesia Assessment:                           - Prior to the procedure, a History and Physical                            was performed, and patient medications and                            allergies were reviewed. The patient's tolerance of                            previous anesthesia was also reviewed. The risks                            and benefits of the procedure and the sedation                            options and risks were discussed with the patient.                            All questions were answered, and informed consent                            was obtained. Prior Anticoagulants: The patient                            last took ibuprofen 14 days prior to the procedure.                            ASA Grade Assessment: II - A patient with mild                            systemic disease. After reviewing the risks and  benefits, the patient was deemed in satisfactory                            condition to undergo the procedure.                           After obtaining informed consent, the colonoscope                            was passed under direct vision. Throughout the                            procedure, the patient's blood pressure,  pulse, and                            oxygen saturations were monitored continuously. The                            EC-[id number] ([id number]) scope was introduced through                            the anus and advanced to the the cecum, identified                            by appendiceal orifice and ileocecal valve. The                            colonoscopy was somewhat difficult due to the                            patient's position intolerance. Successful                            completion of the procedure was aided by increasing                            the dose of sedation medication. The patient                            tolerated the procedure fairly well. The quality of                            the bowel preparation was good. The ileocecal                            valve, appendiceal orifice, and rectum were                            photographed. Scope In: 1:37:43 PM Scope Out: 2:10:11 PM Total Procedure Duration: 0 hours 32 minutes 28 seconds  Findings:      The perianal and digital rectal examinations were normal.      Two sessile polyps were found in the proximal sigmoid colon. The polyps       were 5 mm in size.  These polyps were removed with a cold snare.       Resection and retrieval were complete. The pathology specimen was placed       into Bottle Number 3.      A few small-mouthed diverticula were found in the sigmoid colon.      External hemorrhoids were found during retroflexion. The hemorrhoids       were small.      Anal papilla(e) were hypertrophied. Impression:               - Two 5 mm polyps in the proximal sigmoid colon,                            removed with a cold snare. Resected and retrieved.                           - Diverticulosis in the sigmoid colon.                           - External hemorrhoids.                           - Anal papilla(e) were hypertrophied. Moderate Sedation:      Moderate (conscious) sedation was administered by the  endoscopy nurse       and supervised by the endoscopist. The following parameters were       monitored: oxygen saturation, heart rate, blood pressure, CO2       capnography and response to care. Total physician intraservice time was       37 minutes. Recommendation:           - Patient has a contact number available for                            emergencies. The signs and symptoms of potential                            delayed complications were discussed with the                            patient. Return to normal activities tomorrow.                            Written discharge instructions were provided to the                            patient.                           - See the other procedure note for documentation of                            additional recommendations.                           - Continue present medications.                           -  Await pathology results.                           - Repeat colonoscopy in 5 years for surveillance.                           - Mechanical soft diet today. Procedure Code(s):        --- Professional ---                           [phone], Colonoscopy, flexible; with removal of                            tumor(s), polyp(s), or other lesion(s) by snare                            technique                           99152, Moderate sedation services provided by the                            same physician or other qualified health care                            professional performing the diagnostic or                            therapeutic service that the sedation supports,                            requiring the presence of an independent trained                            observer to assist in the monitoring of the                            patient's level of consciousness and physiological                            status; initial 15 minutes of intraservice time,                            patient age 26 years or older                            [phone], Moderate sedation services; each additional                            15 minutes intraservice time Diagnosis Code(s):        --- Professional ---                           Z86.010, Personal history of colonic polyps  D12.5, Benign neoplasm of sigmoid colon                           K64.4, Residual hemorrhoidal skin tags                           K62.89, Other specified diseases of anus and rectum                           K57.30, Diverticulosis of large intestine without                            perforation or abscess without bleeding CPT copyright 2016 American [employer]. All rights reserved. The codes documented in this report are preliminary and upon coder review may  be revised to meet current compliance requirements. Hildred Laser, MD Hildred Laser, MD 07/10/2016 2:33:07 PM This report has been signed electronically. Number of Addenda: 0

## 2016-07-11 ENCOUNTER — Telehealth (INDEPENDENT_AMBULATORY_CARE_PROVIDER_SITE_OTHER): Payer: Self-pay | Admitting: Internal Medicine

## 2016-07-11 NOTE — Telephone Encounter (Signed)
Patient called stating that she is having pain in her throat. She is not having fever or chest pain. She is able to swallow pudding and liquids. She is not taking pain medication as advised.

## 2016-07-11 NOTE — Telephone Encounter (Signed)
Continuation of the previous note. Should advised to take Hycodan liquid every 4 as for 24 hours it after when necessary. Should continue soft foods for 24 hours.

## 2016-07-14 ENCOUNTER — Telehealth (INDEPENDENT_AMBULATORY_CARE_PROVIDER_SITE_OTHER): Payer: Self-pay | Admitting: Internal Medicine

## 2016-07-14 ENCOUNTER — Telehealth (INDEPENDENT_AMBULATORY_CARE_PROVIDER_SITE_OTHER): Payer: Self-pay | Admitting: *Deleted

## 2016-07-14 DIAGNOSIS — K219 Gastro-esophageal reflux disease without esophagitis: Secondary | ICD-10-CM

## 2016-07-14 MED ORDER — HYDROCODONE-ACETAMINOPHEN 7.5-325 MG/15ML PO SOLN: 15.0000 mL | Freq: Four times a day (QID) | ORAL | 0 refills | Status: DC | PRN

## 2016-07-14 MED ORDER — HYDROCODONE-ACETAMINOPHEN 7.5-325 MG/15ML PO SOLN
15.0000 mL | Freq: Four times a day (QID) | ORAL | 0 refills | Status: DC | PRN
Start: 2016-07-14 — End: 2016-07-30

## 2016-07-14 NOTE — Telephone Encounter (Signed)
addressed

## 2016-07-14 NOTE — Telephone Encounter (Signed)
error 

## 2016-07-14 NOTE — Telephone Encounter (Signed)
Forwarded to Terri to complete.  Dr.Rehman called the office he ask that a new Rx be written for patient , as enough was not dispensed the first time. Hydrocodone-Acetaminophen 7.5-325/15 ML solution. Take 2 mL by mouth every 6 hours. Dispense 12 oz.  Call patient when it is ready.

## 2016-07-14 NOTE — Telephone Encounter (Signed)
The first Rx was destroyed. Umbarger Hoff LPN witnessed.

## 2016-07-16 DIAGNOSIS — M26629 Arthralgia of temporomandibular joint, unspecified side: Secondary | ICD-10-CM | POA: Diagnosis not present

## 2016-07-16 DIAGNOSIS — H9201 Otalgia, right ear: Secondary | ICD-10-CM | POA: Diagnosis not present

## 2016-07-17 ENCOUNTER — Encounter (HOSPITAL_COMMUNITY): Payer: Self-pay | Admitting: Internal Medicine

## 2016-07-28 ENCOUNTER — Ambulatory Visit (INDEPENDENT_AMBULATORY_CARE_PROVIDER_SITE_OTHER): Payer: 59 | Admitting: Internal Medicine

## 2016-07-30 ENCOUNTER — Encounter (INDEPENDENT_AMBULATORY_CARE_PROVIDER_SITE_OTHER): Payer: Self-pay | Admitting: Internal Medicine

## 2016-07-30 ENCOUNTER — Ambulatory Visit (INDEPENDENT_AMBULATORY_CARE_PROVIDER_SITE_OTHER): Payer: 59 | Admitting: Internal Medicine

## 2016-07-30 ENCOUNTER — Encounter (INDEPENDENT_AMBULATORY_CARE_PROVIDER_SITE_OTHER): Payer: Self-pay | Admitting: *Deleted

## 2016-07-30 VITALS — BP 130/82 | HR 72 | Temp 98.5°F | Ht 61.0 in | Wt 160.8 lb

## 2016-07-30 DIAGNOSIS — R1310 Dysphagia, unspecified: Secondary | ICD-10-CM

## 2016-07-30 DIAGNOSIS — R1319 Other dysphagia: Secondary | ICD-10-CM

## 2016-07-30 NOTE — Patient Instructions (Addendum)
DG esphagram.
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
## 2016-08-01 ENCOUNTER — Ambulatory Visit (HOSPITAL_COMMUNITY)
Admission: RE | Admit: 2016-08-01 | Discharge: 2016-08-01 | Disposition: A | Discharge status: Home / Self Care | Payer: 59 | Source: Ambulatory Visit | Attending: Internal Medicine | Admitting: Internal Medicine

## 2016-08-01 DIAGNOSIS — R1319 Other dysphagia: Secondary | ICD-10-CM

## 2016-08-01 DIAGNOSIS — R1310 Dysphagia, unspecified: Secondary | ICD-10-CM | POA: Diagnosis not present

## 2016-08-25 DIAGNOSIS — R05 Cough: Secondary | ICD-10-CM | POA: Diagnosis not present

## 2016-08-25 DIAGNOSIS — J209 Acute bronchitis, unspecified: Secondary | ICD-10-CM | POA: Diagnosis not present

## 2016-10-05 ENCOUNTER — Other Ambulatory Visit (INDEPENDENT_AMBULATORY_CARE_PROVIDER_SITE_OTHER): Payer: Self-pay | Admitting: Internal Medicine

## 2016-10-05 DIAGNOSIS — K219 Gastro-esophageal reflux disease without esophagitis: Secondary | ICD-10-CM

## 2016-10-09 DIAGNOSIS — E213 Hyperparathyroidism, unspecified: Secondary | ICD-10-CM | POA: Diagnosis not present

## 2016-10-09 DIAGNOSIS — R7301 Impaired fasting glucose: Secondary | ICD-10-CM | POA: Diagnosis not present

## 2016-10-09 DIAGNOSIS — M859 Disorder of bone density and structure, unspecified: Secondary | ICD-10-CM | POA: Diagnosis not present

## 2016-10-09 DIAGNOSIS — E559 Vitamin D deficiency, unspecified: Secondary | ICD-10-CM | POA: Diagnosis not present

## 2016-10-09 DIAGNOSIS — M8580 Other specified disorders of bone density and structure, unspecified site: Secondary | ICD-10-CM | POA: Diagnosis not present

## 2017-01-09 DIAGNOSIS — D485 Neoplasm of uncertain behavior of skin: Secondary | ICD-10-CM | POA: Diagnosis not present

## 2017-01-09 DIAGNOSIS — L718 Other rosacea: Secondary | ICD-10-CM | POA: Diagnosis not present

## 2017-01-22 DIAGNOSIS — E559 Vitamin D deficiency, unspecified: Secondary | ICD-10-CM | POA: Diagnosis not present

## 2017-01-22 DIAGNOSIS — G629 Polyneuropathy, unspecified: Secondary | ICD-10-CM | POA: Diagnosis not present

## 2017-01-22 DIAGNOSIS — R7301 Impaired fasting glucose: Secondary | ICD-10-CM | POA: Diagnosis not present

## 2017-01-22 DIAGNOSIS — E213 Hyperparathyroidism, unspecified: Secondary | ICD-10-CM | POA: Diagnosis not present

## 2017-01-27 DIAGNOSIS — D2239 Melanocytic nevi of other parts of face: Secondary | ICD-10-CM | POA: Diagnosis not present

## 2017-01-27 DIAGNOSIS — L739 Follicular disorder, unspecified: Secondary | ICD-10-CM | POA: Diagnosis not present

## 2017-02-25 DIAGNOSIS — Z1389 Encounter for screening for other disorder: Secondary | ICD-10-CM | POA: Diagnosis not present

## 2017-02-25 DIAGNOSIS — E782 Mixed hyperlipidemia: Secondary | ICD-10-CM | POA: Diagnosis not present

## 2017-02-25 DIAGNOSIS — Z720 Tobacco use: Secondary | ICD-10-CM | POA: Diagnosis not present

## 2017-02-25 DIAGNOSIS — Z0001 Encounter for general adult medical examination with abnormal findings: Secondary | ICD-10-CM | POA: Diagnosis not present

## 2017-03-03 DIAGNOSIS — R932 Abnormal findings on diagnostic imaging of liver and biliary tract: Secondary | ICD-10-CM | POA: Diagnosis not present

## 2017-03-03 DIAGNOSIS — K760 Fatty (change of) liver, not elsewhere classified: Secondary | ICD-10-CM | POA: Diagnosis not present

## 2017-03-03 DIAGNOSIS — R1084 Generalized abdominal pain: Secondary | ICD-10-CM | POA: Diagnosis not present

## 2017-03-23 DIAGNOSIS — E559 Vitamin D deficiency, unspecified: Secondary | ICD-10-CM | POA: Diagnosis not present

## 2017-04-30 DIAGNOSIS — Z139 Encounter for screening, unspecified: Secondary | ICD-10-CM | POA: Diagnosis not present

## 2017-05-26 DIAGNOSIS — K7581 Nonalcoholic steatohepatitis (NASH): Secondary | ICD-10-CM | POA: Diagnosis not present

## 2017-08-21 DIAGNOSIS — R5383 Other fatigue: Secondary | ICD-10-CM | POA: Diagnosis not present

## 2017-08-21 DIAGNOSIS — E782 Mixed hyperlipidemia: Secondary | ICD-10-CM | POA: Diagnosis not present

## 2017-08-21 DIAGNOSIS — R749 Abnormal serum enzyme level, unspecified: Secondary | ICD-10-CM | POA: Diagnosis not present

## 2017-08-26 DIAGNOSIS — E782 Mixed hyperlipidemia: Secondary | ICD-10-CM | POA: Diagnosis not present

## 2017-08-26 DIAGNOSIS — K7581 Nonalcoholic steatohepatitis (NASH): Secondary | ICD-10-CM | POA: Diagnosis not present

## 2017-09-08 DIAGNOSIS — S83242D Other tear of medial meniscus, current injury, left knee, subsequent encounter: Secondary | ICD-10-CM | POA: Diagnosis not present

## 2017-09-29 DIAGNOSIS — S83242D Other tear of medial meniscus, current injury, left knee, subsequent encounter: Secondary | ICD-10-CM | POA: Diagnosis not present

## 2017-09-30 DIAGNOSIS — M25562 Pain in left knee: Secondary | ICD-10-CM | POA: Diagnosis not present

## 2017-10-05 DIAGNOSIS — R7301 Impaired fasting glucose: Secondary | ICD-10-CM | POA: Diagnosis not present

## 2017-10-05 DIAGNOSIS — E213 Hyperparathyroidism, unspecified: Secondary | ICD-10-CM | POA: Diagnosis not present

## 2017-10-05 DIAGNOSIS — E559 Vitamin D deficiency, unspecified: Secondary | ICD-10-CM | POA: Diagnosis not present

## 2017-10-12 DIAGNOSIS — E213 Hyperparathyroidism, unspecified: Secondary | ICD-10-CM | POA: Diagnosis not present

## 2017-10-12 DIAGNOSIS — R7301 Impaired fasting glucose: Secondary | ICD-10-CM | POA: Diagnosis not present

## 2017-10-12 DIAGNOSIS — E559 Vitamin D deficiency, unspecified: Secondary | ICD-10-CM | POA: Diagnosis not present

## 2017-10-13 DIAGNOSIS — S93492A Sprain of other ligament of left ankle, initial encounter: Secondary | ICD-10-CM | POA: Diagnosis not present

## 2017-10-13 DIAGNOSIS — S83242D Other tear of medial meniscus, current injury, left knee, subsequent encounter: Secondary | ICD-10-CM | POA: Diagnosis not present

## 2017-11-04 DIAGNOSIS — G8918 Other acute postprocedural pain: Secondary | ICD-10-CM | POA: Diagnosis not present

## 2017-11-04 DIAGNOSIS — M94262 Chondromalacia, left knee: Secondary | ICD-10-CM | POA: Diagnosis not present

## 2017-11-04 DIAGNOSIS — S83282A Other tear of lateral meniscus, current injury, left knee, initial encounter: Secondary | ICD-10-CM | POA: Diagnosis not present

## 2017-11-04 DIAGNOSIS — S83242A Other tear of medial meniscus, current injury, left knee, initial encounter: Secondary | ICD-10-CM | POA: Diagnosis not present

## 2017-11-04 HISTORY — PX: KNEE ARTHROSCOPY: SUR90

## 2017-11-05 DIAGNOSIS — S61451A Open bite of right hand, initial encounter: Secondary | ICD-10-CM | POA: Diagnosis not present

## 2017-11-09 ENCOUNTER — Other Ambulatory Visit: Payer: Self-pay | Admitting: Orthopedic Surgery

## 2017-11-09 ENCOUNTER — Other Ambulatory Visit: Payer: Self-pay

## 2017-11-09 ENCOUNTER — Encounter (HOSPITAL_BASED_OUTPATIENT_CLINIC_OR_DEPARTMENT_OTHER): Payer: Self-pay | Admitting: *Deleted

## 2017-11-09 DIAGNOSIS — W5501XA Bitten by cat, initial encounter: Secondary | ICD-10-CM | POA: Diagnosis not present

## 2017-11-09 DIAGNOSIS — S61451A Open bite of right hand, initial encounter: Secondary | ICD-10-CM | POA: Diagnosis not present

## 2017-11-10 ENCOUNTER — Encounter (HOSPITAL_BASED_OUTPATIENT_CLINIC_OR_DEPARTMENT_OTHER)
Admission: RE | Disposition: A | Discharge status: Home / Self Care | Payer: Self-pay | Source: Ambulatory Visit | Attending: Orthopedic Surgery

## 2017-11-10 ENCOUNTER — Encounter (HOSPITAL_BASED_OUTPATIENT_CLINIC_OR_DEPARTMENT_OTHER): Payer: Self-pay | Admitting: *Deleted

## 2017-11-10 ENCOUNTER — Ambulatory Visit (HOSPITAL_BASED_OUTPATIENT_CLINIC_OR_DEPARTMENT_OTHER): Payer: 59 | Admitting: Anesthesiology

## 2017-11-10 ENCOUNTER — Ambulatory Visit (HOSPITAL_BASED_OUTPATIENT_CLINIC_OR_DEPARTMENT_OTHER)
Admission: RE | Admit: 2017-11-10 | Discharge: 2017-11-10 | Disposition: A | Discharge status: Home / Self Care | Payer: 59 | Source: Ambulatory Visit | Attending: Orthopedic Surgery | Admitting: Orthopedic Surgery

## 2017-11-10 ENCOUNTER — Other Ambulatory Visit: Payer: Self-pay

## 2017-11-10 DIAGNOSIS — Z79899 Other long term (current) drug therapy: Secondary | ICD-10-CM | POA: Diagnosis not present

## 2017-11-10 DIAGNOSIS — W5501XA Bitten by cat, initial encounter: Secondary | ICD-10-CM | POA: Insufficient documentation

## 2017-11-10 DIAGNOSIS — S60221A Contusion of right hand, initial encounter: Secondary | ICD-10-CM | POA: Diagnosis not present

## 2017-11-10 DIAGNOSIS — K219 Gastro-esophageal reflux disease without esophagitis: Secondary | ICD-10-CM | POA: Diagnosis not present

## 2017-11-10 DIAGNOSIS — S61551A Open bite of right wrist, initial encounter: Secondary | ICD-10-CM | POA: Diagnosis not present

## 2017-11-10 DIAGNOSIS — S61451A Open bite of right hand, initial encounter: Secondary | ICD-10-CM | POA: Diagnosis not present

## 2017-11-10 DIAGNOSIS — F17210 Nicotine dependence, cigarettes, uncomplicated: Secondary | ICD-10-CM | POA: Insufficient documentation

## 2017-11-10 DIAGNOSIS — Z791 Long term (current) use of non-steroidal anti-inflammatories (NSAID): Secondary | ICD-10-CM | POA: Diagnosis not present

## 2017-11-10 HISTORY — PX: INCISION AND DRAINAGE ABSCESS: SHX5864

## 2017-11-10 HISTORY — DX: Family history of other specified conditions: Z84.89

## 2017-11-10 SURGERY — INCISION AND DRAINAGE, ABSCESS
Anesthesia: Monitor Anesthesia Care | Site: Hand | Laterality: Right

## 2017-11-10 MED ORDER — LIDOCAINE HCL (PF) 0.5 % IJ SOLN
INTRAMUSCULAR | Status: DC | PRN
  Administered 2017-11-10: 30 mL via INTRAVENOUS

## 2017-11-10 MED ORDER — BUPIVACAINE HCL (PF) 0.25 % IJ SOLN
INTRAMUSCULAR | Status: DC | PRN
  Administered 2017-11-10: 5 mL

## 2017-11-10 MED ORDER — ONDANSETRON HCL 4 MG/2ML IJ SOLN: 4.0000 mg | Freq: Four times a day (QID) | INTRAMUSCULAR | Status: DC | PRN

## 2017-11-10 MED ORDER — SCOPOLAMINE 1 MG/3DAYS TD PT72: 1.0000 | MEDICATED_PATCH | Freq: Once | TRANSDERMAL | Status: DC | PRN

## 2017-11-10 MED ORDER — MIDAZOLAM HCL 2 MG/2ML IJ SOLN
INTRAMUSCULAR | Status: AC
  Filled 2017-11-10: qty 2

## 2017-11-10 MED ORDER — FENTANYL CITRATE (PF) 100 MCG/2ML IJ SOLN
INTRAMUSCULAR | Status: AC
  Filled 2017-11-10: qty 2

## 2017-11-10 MED ORDER — MIDAZOLAM HCL 2 MG/2ML IJ SOLN
1.0000 mg | INTRAMUSCULAR | Status: DC | PRN
  Administered 2017-11-10: 2 mg via INTRAVENOUS

## 2017-11-10 MED ORDER — HYDROCODONE-ACETAMINOPHEN 5-325 MG PO TABS: 1.0000 | ORAL_TABLET | Freq: Four times a day (QID) | ORAL | 0 refills | Status: DC | PRN

## 2017-11-10 MED ORDER — VANCOMYCIN HCL IN DEXTROSE 1-5 GM/200ML-% IV SOLN
1000.0000 mg | Freq: Once | INTRAVENOUS | Status: AC
  Administered 2017-11-10: 1000 mg via INTRAVENOUS

## 2017-11-10 MED ORDER — OXYCODONE HCL 5 MG/5ML PO SOLN: 5.0000 mg | Freq: Once | ORAL | Status: DC | PRN

## 2017-11-10 MED ORDER — FENTANYL CITRATE (PF) 100 MCG/2ML IJ SOLN
50.0000 ug | INTRAMUSCULAR | Status: DC | PRN
  Administered 2017-11-10: 50 ug via INTRAVENOUS

## 2017-11-10 MED ORDER — ONDANSETRON HCL 4 MG/2ML IJ SOLN
INTRAMUSCULAR | Status: DC | PRN
  Administered 2017-11-10: 4 mg via INTRAVENOUS

## 2017-11-10 MED ORDER — CHLORHEXIDINE GLUCONATE 4 % EX LIQD: 60.0000 mL | Freq: Once | CUTANEOUS | Status: DC

## 2017-11-10 MED ORDER — FENTANYL CITRATE (PF) 100 MCG/2ML IJ SOLN: 25.0000 ug | INTRAMUSCULAR | Status: DC | PRN

## 2017-11-10 MED ORDER — VANCOMYCIN HCL IN DEXTROSE 1-5 GM/200ML-% IV SOLN
INTRAVENOUS | Status: AC
  Filled 2017-11-10: qty 200

## 2017-11-10 MED ORDER — OXYCODONE HCL 5 MG PO TABS: 5.0000 mg | ORAL_TABLET | Freq: Once | ORAL | Status: DC | PRN

## 2017-11-10 MED ORDER — PROPOFOL 500 MG/50ML IV EMUL
INTRAVENOUS | Status: DC | PRN
  Administered 2017-11-10: 50 ug/kg/min via INTRAVENOUS

## 2017-11-10 MED ORDER — LACTATED RINGERS IV SOLN
INTRAVENOUS | Status: DC
  Administered 2017-11-10 (×2): via INTRAVENOUS

## 2017-11-10 SURGICAL SUPPLY — 45 items
BAG DECANTER FOR FLEXI CONT (MISCELLANEOUS) IMPLANT
BLADE MINI RND TIP GREEN BEAV (BLADE) IMPLANT
BLADE SURG 15 STRL LF DISP TIS (BLADE) ×1 IMPLANT
BLADE SURG 15 STRL SS (BLADE) ×3
BNDG CMPR 9X4 STRL LF SNTH (GAUZE/BANDAGES/DRESSINGS)
BNDG COHESIVE 1X5 TAN STRL LF (GAUZE/BANDAGES/DRESSINGS) IMPLANT
BNDG COHESIVE 3X5 TAN STRL LF (GAUZE/BANDAGES/DRESSINGS) IMPLANT
BNDG ESMARK 4X9 LF (GAUZE/BANDAGES/DRESSINGS) IMPLANT
BNDG GAUZE ELAST 4 BULKY (GAUZE/BANDAGES/DRESSINGS) IMPLANT
CHLORAPREP W/TINT 26ML (MISCELLANEOUS) ×3 IMPLANT
CORD BIPOLAR FORCEPS 12FT (ELECTRODE) ×3 IMPLANT
COVER BACK TABLE 60X90IN (DRAPES) ×3 IMPLANT
COVER MAYO STAND STRL (DRAPES) ×3 IMPLANT
CUFF TOURNIQUET SINGLE 18IN (TOURNIQUET CUFF) IMPLANT
DRAPE EXTREMITY T 121X128X90 (DRAPE) ×3 IMPLANT
DRAPE SURG 17X23 STRL (DRAPES) ×3 IMPLANT
GAUZE PACKING IODOFORM 1/4X15 (GAUZE/BANDAGES/DRESSINGS) IMPLANT
GAUZE SPONGE 4X4 12PLY STRL (GAUZE/BANDAGES/DRESSINGS) ×3 IMPLANT
GAUZE XEROFORM 1X8 LF (GAUZE/BANDAGES/DRESSINGS) ×3 IMPLANT
GLOVE BIOGEL PI IND STRL 8.5 (GLOVE) ×1 IMPLANT
GLOVE BIOGEL PI INDICATOR 8.5 (GLOVE) ×2
GLOVE SURG ORTHO 8.0 STRL STRW (GLOVE) ×3 IMPLANT
GOWN STRL REUS W/ TWL LRG LVL3 (GOWN DISPOSABLE) ×1 IMPLANT
GOWN STRL REUS W/TWL LRG LVL3 (GOWN DISPOSABLE) ×3
GOWN STRL REUS W/TWL XL LVL3 (GOWN DISPOSABLE) ×3 IMPLANT
LOOP VESSEL MAXI BLUE (MISCELLANEOUS) IMPLANT
NDL PRECISIONGLIDE 27X1.5 (NEEDLE) IMPLANT
NEEDLE PRECISIONGLIDE 27X1.5 (NEEDLE) IMPLANT
NS IRRIG 1000ML POUR BTL (IV SOLUTION) ×3 IMPLANT
PACK BASIN DAY SURGERY FS (CUSTOM PROCEDURE TRAY) ×3 IMPLANT
PAD CAST 3X4 CTTN HI CHSV (CAST SUPPLIES) IMPLANT
PADDING CAST ABS 4INX4YD NS (CAST SUPPLIES) ×2
PADDING CAST ABS COTTON 4X4 ST (CAST SUPPLIES) ×1 IMPLANT
PADDING CAST COTTON 3X4 STRL (CAST SUPPLIES)
SPLINT PLASTER CAST XFAST 3X15 (CAST SUPPLIES) IMPLANT
SPLINT PLASTER XTRA FASTSET 3X (CAST SUPPLIES)
STOCKINETTE 4X48 STRL (DRAPES) ×3 IMPLANT
SUT ETHILON 4 0 PS 2 18 (SUTURE) ×3 IMPLANT
SWAB COLLECTION DEVICE MRSA (MISCELLANEOUS) IMPLANT
SWAB CULTURE ESWAB REG 1ML (MISCELLANEOUS) IMPLANT
SYR BULB 3OZ (MISCELLANEOUS) ×3 IMPLANT
SYR CONTROL 10ML LL (SYRINGE) IMPLANT
TOWEL GREEN STERILE FF (TOWEL DISPOSABLE) ×3 IMPLANT
TUBE FEEDING ENTERAL 5FR 16IN (TUBING) IMPLANT
UNDERPAD 30X30 (UNDERPADS AND DIAPERS) ×3 IMPLANT

## 2017-11-10 NOTE — Transfer of Care (Signed)
Immediate Anesthesia Transfer of Care Note  Patient: Yvette Curry  Procedure(s) Performed: INCISION AND DRAINAGE RIGHT HAND (Right Hand)  Patient Location: PACU  Anesthesia Type:Bier block  Level of Consciousness: awake, alert , oriented and patient cooperative  Airway & Oxygen Therapy: Patient Spontanous Breathing and Patient connected to nasal cannula oxygen  Post-op Assessment: Report given to RN and Post -op Vital signs reviewed and stable  Post vital signs: Reviewed and stable  Last Vitals:  Vitals Value Taken Time  BP    Temp    Pulse 76 11/10/2017 10:15 AM  Resp 17 11/10/2017 10:15 AM  SpO2 100 % 11/10/2017 10:15 AM  Vitals shown include unvalidated device data.  Last Pain:  Vitals:   11/10/17 0903  TempSrc: Oral  PainSc: 2       Patients Stated Pain Goal: 4 (61/95/09 3267)  Complications: No apparent anesthesia complications

## 2017-11-10 NOTE — Anesthesia Preprocedure Evaluation (Signed)
Anesthesia Evaluation  Patient identified by MRN, date of birth, ID band Patient awake    Reviewed: Allergy & Precautions, H&P , NPO status , Patient's Chart, lab work & pertinent test results  Airway Mallampati: II   Neck ROM: full    Dental   Pulmonary Current Smoker,    breath sounds clear to auscultation       Cardiovascular negative cardio ROS   Rhythm:regular Rate:Normal     Neuro/Psych PSYCHIATRIC DISORDERS Anxiety Depression    GI/Hepatic GERD  ,  Endo/Other    Renal/GU      Musculoskeletal  (+) Arthritis ,   Abdominal   Peds  Hematology   Anesthesia Other Findings   Reproductive/Obstetrics                             Anesthesia Physical Anesthesia Plan  ASA: II  Anesthesia Plan: Bier Block and MAC and Bier Block-LIDOCAINE ONLY   Post-op Pain Management:    Induction: Intravenous  PONV Risk Score and Plan: 1 and Ondansetron, Propofol infusion, Treatment may vary due to age or medical condition and Midazolam  Airway Management Planned: Simple Face Mask  Additional Equipment:   Intra-op Plan:   Post-operative Plan:   Informed Consent: I have reviewed the patients History and Physical, chart, labs and discussed the procedure including the risks, benefits and alternatives for the proposed anesthesia with the patient or authorized representative who has indicated his/her understanding and acceptance.     Plan Discussed with: CRNA, Anesthesiologist and Surgeon  Anesthesia Plan Comments:         Anesthesia Quick Evaluation

## 2017-11-10 NOTE — Anesthesia Procedure Notes (Signed)
Procedure Name: MAC Date/Time: 11/10/2017 10:02 AM Performed by: Signe Colt, CRNA Pre-anesthesia Checklist: Patient identified, Emergency Drugs available, Suction available, Patient being monitored and Timeout performed Patient Re-evaluated:Patient Re-evaluated prior to induction Oxygen Delivery Method: Simple face mask

## 2017-11-10 NOTE — Brief Op Note (Signed)
11/10/2017  10:12 AM  PATIENT:  Yvette Curry  62 y.o. female  PRE-OPERATIVE DIAGNOSIS:  CAT BITE RIGHT HAND  POST-OPERATIVE DIAGNOSIS:  CAT BITE RIGHT HAND  PROCEDURE:  Procedure(s): INCISION AND DRAINAGE RIGHT HAND (Right)  SURGEON:  Surgeon(s) and Role:    * Daryll Brod, MD - Primary  PHYSICIAN ASSISTANT:   ASSISTANTS: none   ANESTHESIA:   local, regional and IV sedation  EBL: 73ml   BLOOD ADMINISTERED:none  DRAINS: none   LOCAL MEDICATIONS USED:  BUPIVICAINE   SPECIMEN:  Source of Specimen:  Cultures  DISPOSITION OF SPECIMEN:  PATHOLOGY  COUNTS:  YES  TOURNIQUET:  * Missing tourniquet times found for documented tourniquets in log: [id number] *  DICTATION: .Viviann Spare Dictation  PLAN OF CARE: Discharge to home after PACU  PATIENT DISPOSITION:  PACU - hemodynamically stable.

## 2017-11-10 NOTE — Discharge Instructions (Addendum)

## 2017-11-10 NOTE — Anesthesia Procedure Notes (Signed)
Anesthesia Regional Block: Bier block (IV Regional)   Pre-Anesthetic Checklist: ,, timeout performed, Correct Patient, Correct Site, Correct Laterality, Correct Procedure,, site marked, surgical consent,, at surgeon's request  Laterality: Right     Needles:  Injection technique: Single-shot  Needle Type: Other      Needle Gauge: 20     Additional Needles:   Procedures:,,,,, intact distal pulses, Esmarch exsanguination, single tourniquet utilized,  Narrative:   Performed by: [employer]
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
## 2017-11-10 NOTE — Op Note (Signed)
NAME: Yvette Curry MEDICAL RECORD NO: [id number] DATE OF BIRTH: [birth year] FACILITY: [facility] LOCATION: [facility] PHYSICIAN: Wynonia Sours, MD   OPERATIVE REPORT   DATE OF PROCEDURE: 11/10/17    PREOPERATIVE DIAGNOSIS:   Cat bite right hand   POSTOPERATIVE DIAGNOSIS:   Same   PROCEDURE: I&D cat bite right hand with evacuation hematoma cultures   SURGEON: Daryll Brod, M.D.   ASSISTANT: none    ANESTHESIA:   Bier block with sedation   INTRAVENOUS FLUIDS:  Per anesthesia flow sheet.   ESTIMATED BLOOD LOSS:  Minimal.   COMPLICATIONS:  None.   SPECIMENS:   Cultures   TOURNIQUET TIME:   * Missing tourniquet times found for documented tourniquets in log: [id number] *   DISPOSITION:  Stable to PACU.   INDICATIONS: Patient is a 61-ear-old right-hand-dominant female who was bitten by her cat approximately 6 days ago.  He has been on amoxicillin with continued discomfort pain and swelling in her right wrist.  This has not entirely improved.  She is admitted now for incision and drainage with an area of swelling pre-peri-and postoperative course been discussed along with risks and complications.  She is aware there is no guarantee to the surgery the possibility of packing further infection injury to arteries nerves tendons and complete relief symptoms distally in the preoperative area the patient seen extremity marked by both patient and surgeon   OPERATIVE COURSE: Patient is brought to the operating room where a forearm-based IV regional anesthetic was carried out without difficulty under the direction the anesthesia department.  She was prepped using ChloraPrep in the supine position with the right arm free.  A three-minute dry time was allowed timeout taken to confirm patient procedure.  After adequate anesthesia was afforded.  A longitudinal incision was made over the cat bite carried down through subcutaneous tissue.  This is the base of the thumb.  A large hematoma was  immediately encountered.  This was evacuated after cultures were taken for both aerobic and anaerobic cultures.  No further bleeding was noted.  Specimen was sent to pathology.  The wound was copiously irrigated with saline.  This was then packed open with iodoform gauze.  A sterile compressive dressing was applied.  This was done after injection of the area the wound with a wrist block with quarter percent bupivacaine without epinephrine approximately 5 cc was used.  On deflation of the tourniquet all fingers immediately pink.  She was taken to the recovery room for observation in satisfactory condition.  She will be discharged home to return [facility].  She will continue her amoxicillin.  Will await cultures.  He is given a gram of vancomycin in the recovery room.   Wynonia Sours, MD Electronically signed, 11/10/17

## 2017-11-11 ENCOUNTER — Encounter (HOSPITAL_BASED_OUTPATIENT_CLINIC_OR_DEPARTMENT_OTHER): Payer: Self-pay | Admitting: Orthopedic Surgery

## 2017-11-11 DIAGNOSIS — M79641 Pain in right hand: Secondary | ICD-10-CM | POA: Diagnosis not present

## 2017-11-11 NOTE — Anesthesia Postprocedure Evaluation (Signed)
Anesthesia Post Note  Patient: Yvette Curry  Procedure(s) Performed: INCISION AND DRAINAGE RIGHT HAND (Right Hand)     Patient location during evaluation: PACU Anesthesia Type: MAC and Bier Block Level of consciousness: awake and alert Pain management: pain level controlled Vital Signs Assessment: post-procedure vital signs reviewed and stable Respiratory status: spontaneous breathing, nonlabored ventilation, respiratory function stable and patient connected to nasal cannula oxygen Cardiovascular status: stable and blood pressure returned to baseline Postop Assessment: no apparent nausea or vomiting Anesthetic complications: no    Last Vitals:  Vitals:   11/10/17 1058 11/10/17 1112  BP: 92/66 (!) 90/56  Pulse: 74 67  Resp: 16 16  Temp:  36.5 C  SpO2: 94% 95%    Last Pain:  Vitals:   11/10/17 1112  TempSrc: Oral  PainSc: 0-No pain   Pain Goal: Patients Stated Pain Goal: 4 (11/10/17 0903)               Delila Kuklinski S

## 2017-11-13 DIAGNOSIS — M25641 Stiffness of right hand, not elsewhere classified: Secondary | ICD-10-CM | POA: Diagnosis not present

## 2017-11-15 LAB — AEROBIC/ANAEROBIC CULTURE W GRAM STAIN (SURGICAL/DEEP WOUND)
Culture: NO GROWTH
Gram Stain: NONE SEEN

## 2017-11-15 LAB — AEROBIC/ANAEROBIC CULTURE (SURGICAL/DEEP WOUND)

## 2017-11-18 DIAGNOSIS — T148XXA Other injury of unspecified body region, initial encounter: Secondary | ICD-10-CM | POA: Diagnosis not present

## 2017-11-18 DIAGNOSIS — S61451A Open bite of right hand, initial encounter: Secondary | ICD-10-CM | POA: Diagnosis not present

## 2017-11-18 DIAGNOSIS — M79641 Pain in right hand: Secondary | ICD-10-CM | POA: Diagnosis not present

## 2017-11-20 DIAGNOSIS — S61451A Open bite of right hand, initial encounter: Secondary | ICD-10-CM | POA: Diagnosis not present

## 2017-11-20 DIAGNOSIS — T148XXA Other injury of unspecified body region, initial encounter: Secondary | ICD-10-CM | POA: Diagnosis not present

## 2017-11-20 DIAGNOSIS — M25641 Stiffness of right hand, not elsewhere classified: Secondary | ICD-10-CM | POA: Diagnosis not present

## 2017-11-23 DIAGNOSIS — T148XXA Other injury of unspecified body region, initial encounter: Secondary | ICD-10-CM | POA: Diagnosis not present

## 2017-11-23 DIAGNOSIS — W5501XA Bitten by cat, initial encounter: Secondary | ICD-10-CM | POA: Diagnosis not present

## 2017-11-23 DIAGNOSIS — S61451A Open bite of right hand, initial encounter: Secondary | ICD-10-CM | POA: Diagnosis not present

## 2017-12-02 DIAGNOSIS — W5501XD Bitten by cat, subsequent encounter: Secondary | ICD-10-CM | POA: Diagnosis not present

## 2017-12-02 DIAGNOSIS — S61451D Open bite of right hand, subsequent encounter: Secondary | ICD-10-CM | POA: Diagnosis not present

## 2018-01-18 DIAGNOSIS — J189 Pneumonia, unspecified organism: Secondary | ICD-10-CM | POA: Diagnosis not present

## 2018-01-18 DIAGNOSIS — Z6824 Body mass index (BMI) 24.0-24.9, adult: Secondary | ICD-10-CM | POA: Diagnosis not present

## 2018-02-09 DIAGNOSIS — S83242D Other tear of medial meniscus, current injury, left knee, subsequent encounter: Secondary | ICD-10-CM | POA: Diagnosis not present

## 2018-02-10 DIAGNOSIS — M25562 Pain in left knee: Secondary | ICD-10-CM | POA: Diagnosis not present

## 2018-02-11 DIAGNOSIS — M25562 Pain in left knee: Secondary | ICD-10-CM | POA: Diagnosis not present

## 2018-02-16 DIAGNOSIS — M25562 Pain in left knee: Secondary | ICD-10-CM | POA: Diagnosis not present

## 2018-02-18 DIAGNOSIS — M25562 Pain in left knee: Secondary | ICD-10-CM | POA: Diagnosis not present

## 2018-02-23 DIAGNOSIS — M25562 Pain in left knee: Secondary | ICD-10-CM | POA: Diagnosis not present

## 2018-02-24 DIAGNOSIS — M25562 Pain in left knee: Secondary | ICD-10-CM | POA: Diagnosis not present

## 2018-03-01 IMAGING — RF DG ESOPHAGUS
11 of 13 series · 14 of 24 positions shown · non-contrast
Comparison: 01/11/2010

CLINICAL DATA: Cervical dysphagia, abnormal feeling in the RIGHT
neck both at rest and while swallowing, change in voice, no prior
surgery, recent esophageal dilatation without resolution of
symptoms, history of smoking, GERD

EXAM:
ESOPHOGRAM / BARIUM SWALLOW / BARIUM TABLET STUDY
TECHNIQUE: Combined double contrast and single contrast examination performed
using effervescent crystals, thick barium liquid, and thin barium
liquid. The patient was observed with fluoroscopy swallowing a 13 mm
barium sulphate tablet.
FLUOROSCOPY TIME:  Fluoroscopy Time:  1 minutes 48 seconds
Radiation Exposure Index (if provided by the fluoroscopic device):
21.2 mGy
Number of Acquired Spot Images: multiple fluoroscopic screen
captures

[Series 1: cp_standard · 0.18mm/px · 1 of 65 frames shown (1 of 11)]
[frame 10/65]
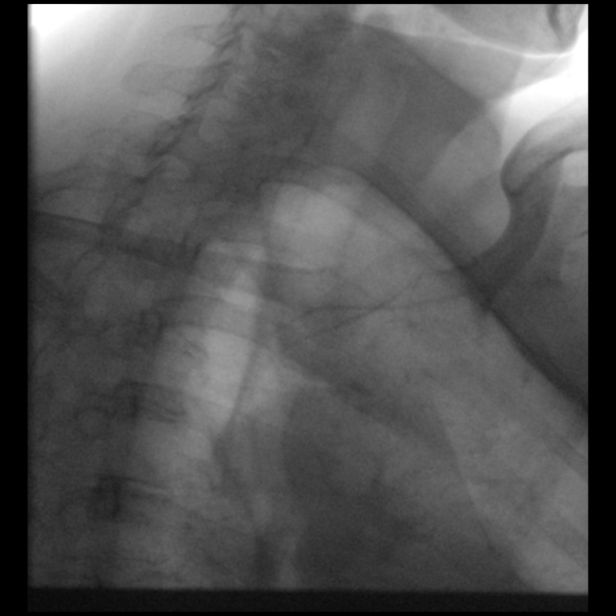

[Series 2: cp_standard · 0.18mm/px · 1 of 16 frames shown (2 of 11)]
[frame 3/16]
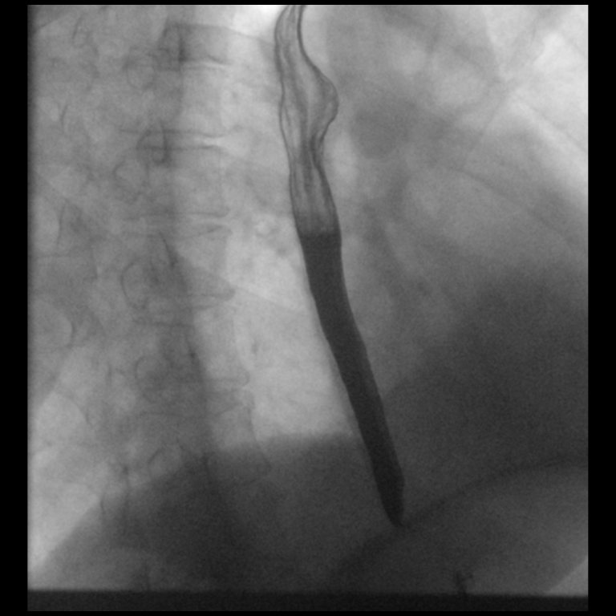

[Series 3: cp_standard · 0.18mm/px · 1 of 115 frames shown (3 of 11)]
[frame 18/115]
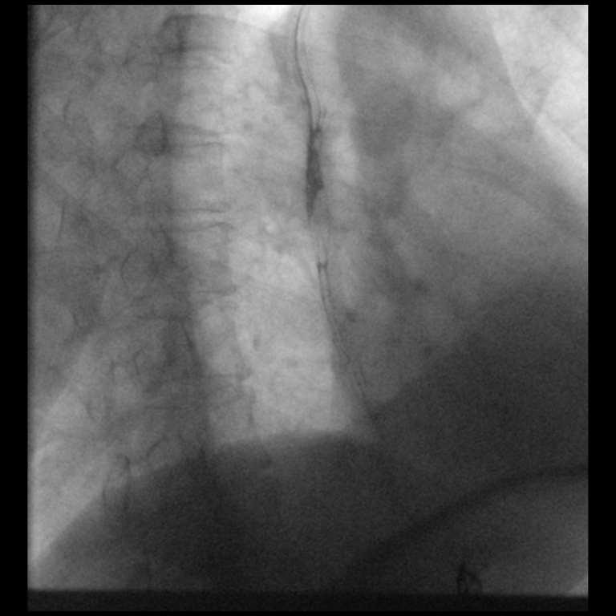

[Series 4: cp_standard · 0.18mm/px · 2 of 54 frames shown (4 of 11)]
[frame 1/54]
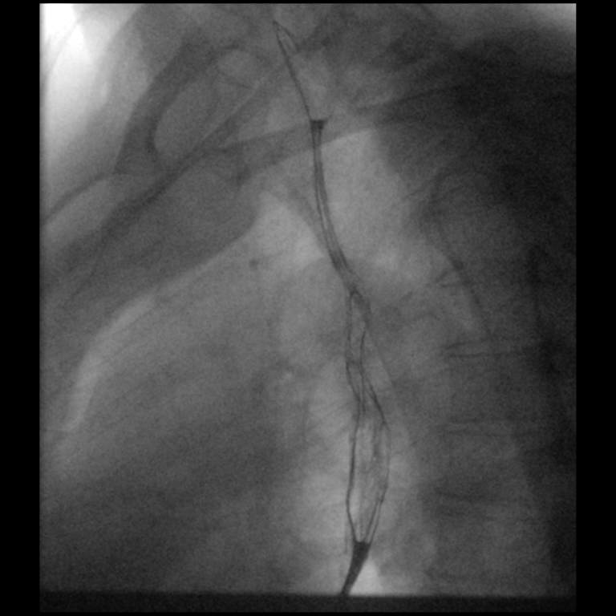
[frame 28/54]
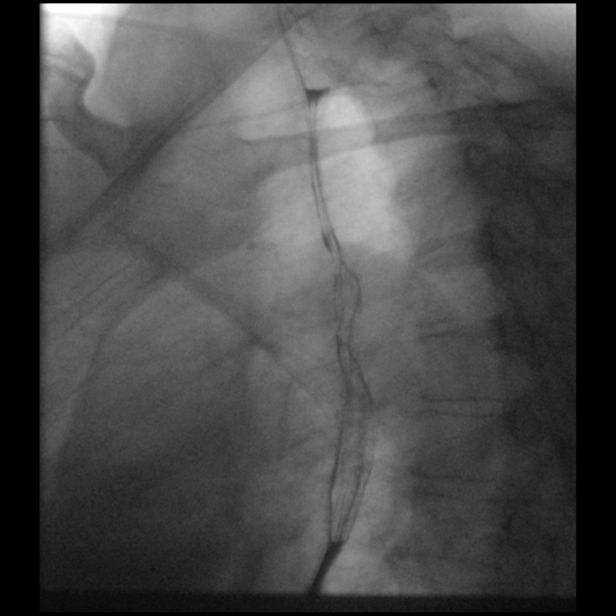

[Series 5: cp_standard · 0.18mm/px · 1 of 29 frames shown (5 of 11)]
[frame 15/29]
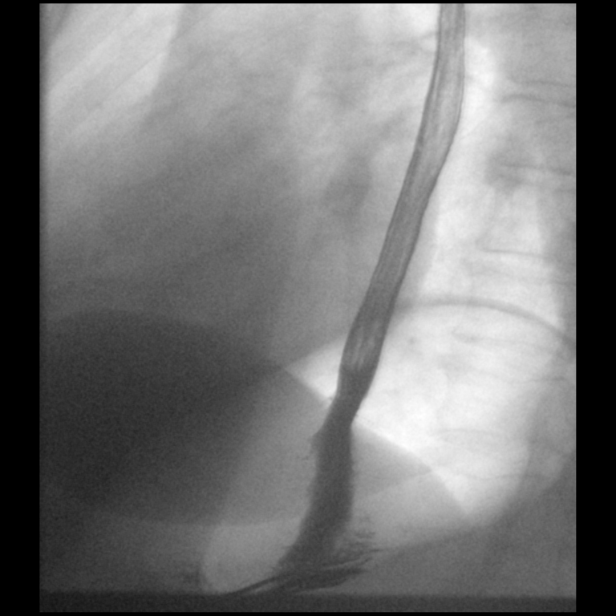

[Series 7: cp_standard · 0.18mm/px · 2 of 44 frames shown (6 of 11)]
[frame 23/44]
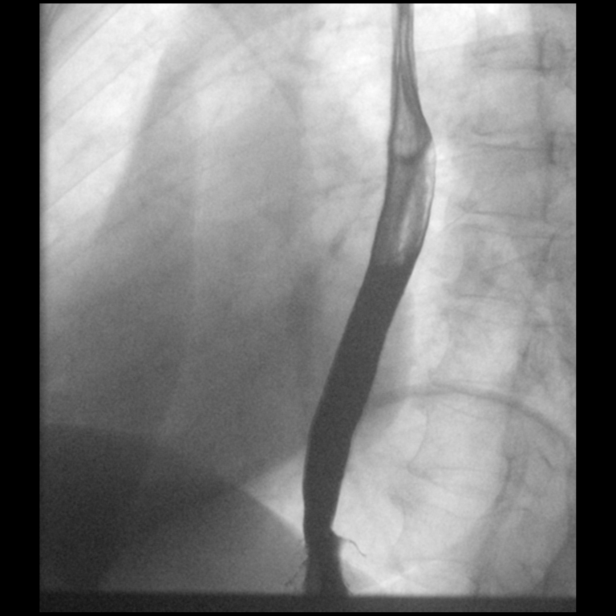
[frame 41/44]
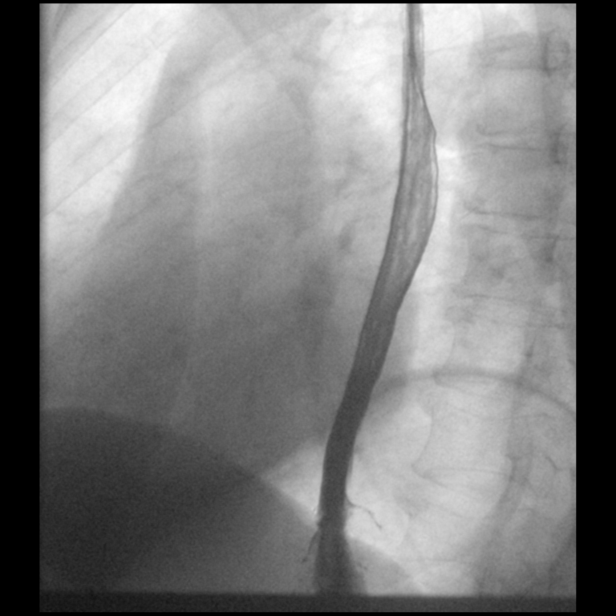

[Series 8: cp_standard · 0.20mm/px · 1 of 34 frames shown (7 of 11)]
[frame 29/34]
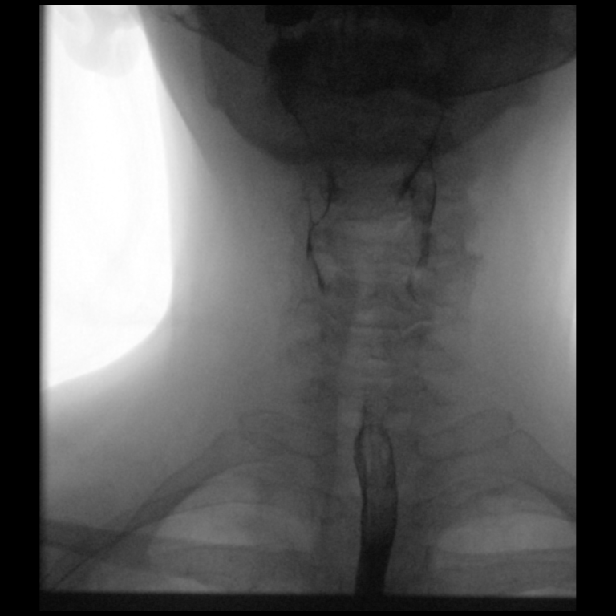

[Series 9: cp_standard · 0.19mm/px · 1 of 50 frames shown (8 of 11)]
[frame 43/50]
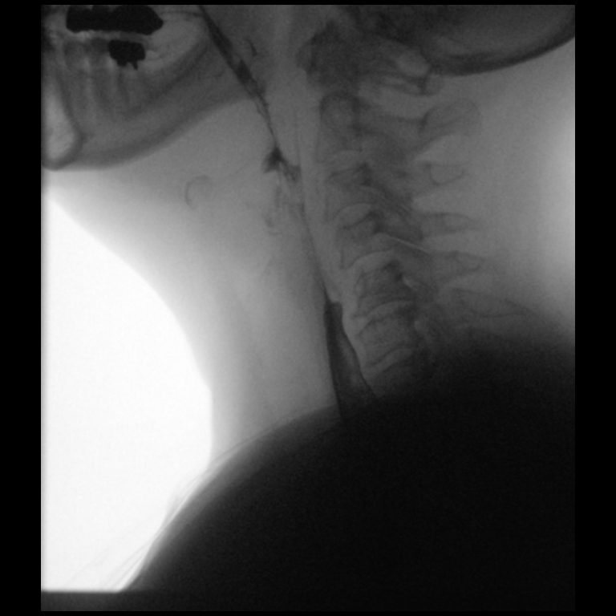

[Series 11: cp_standard · 0.19mm/px · 2 of 29 frames shown (9 of 11)]
[frame 5/29]
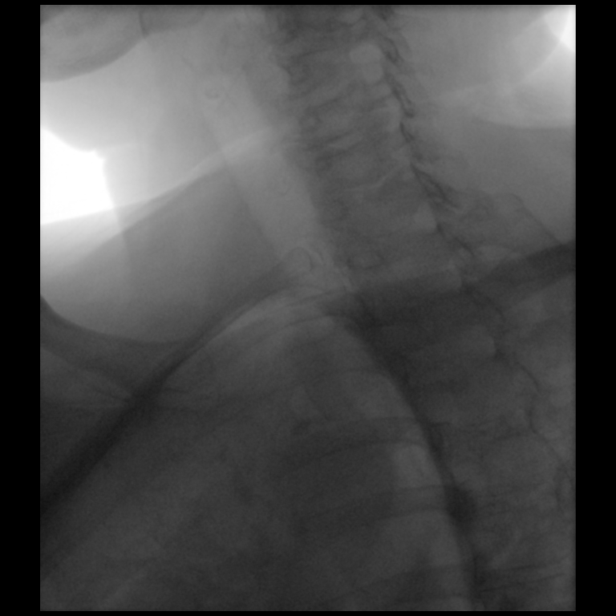
[frame 15/29]
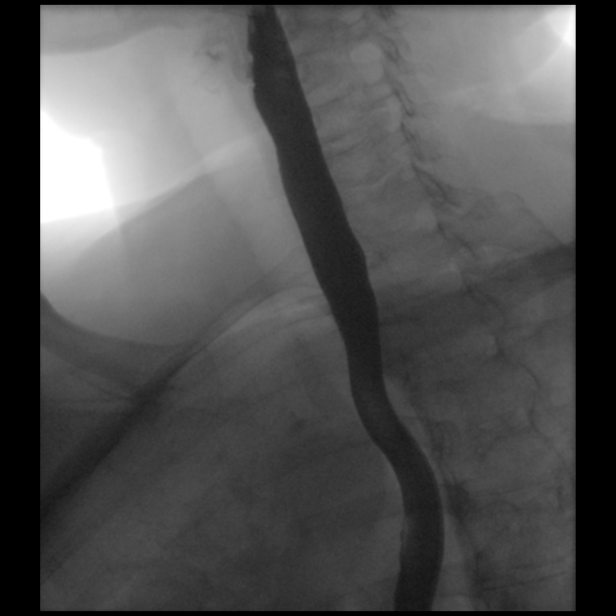

[Series 12: cp_standard · 0.19mm/px · 1 of 17 frames shown (10 of 11)]
[frame 15/17]
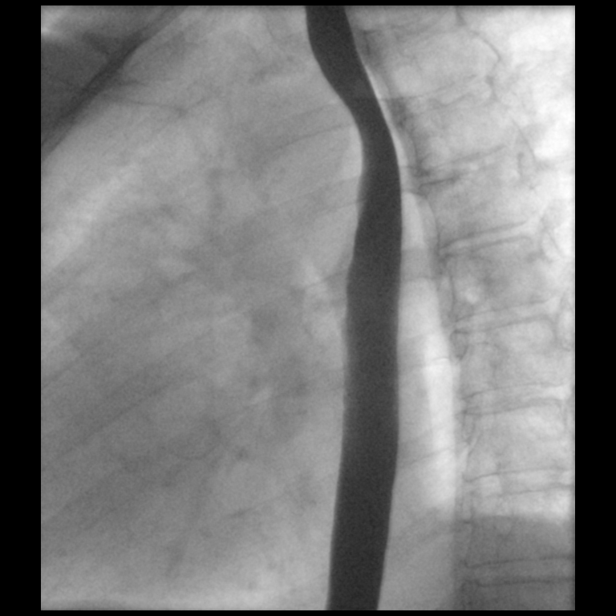

[Series 13: cp_standard · 0.19mm/px · 1 of 85 frames shown (11 of 11)]
[frame 73/85]
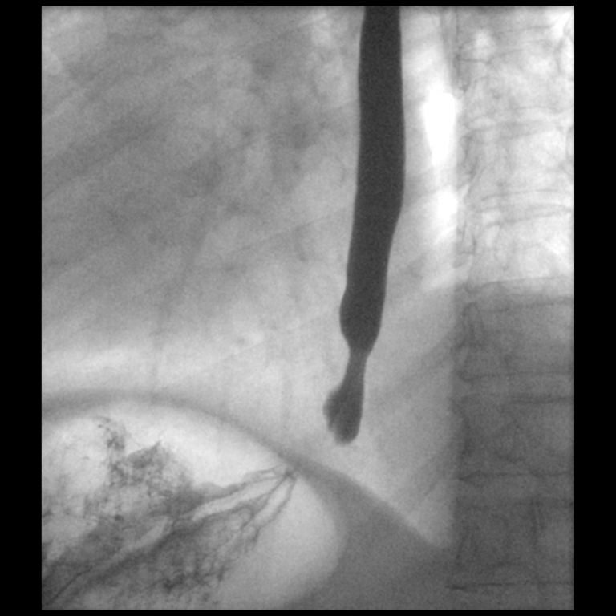

[14 of 24 positions shown; findings below may reference images not displayed]

FINDINGS: Esophageal distention:  Normal without mass or stricture

Filling defects:  None

12.5 mm barium tablet: Passed from oral cavity to stomach without
obstruction

Motility:  Normal

Mucosa:  Smooth without irregularity or ulceration

Hypopharynx/cervical esophagus: Normal without laryngeal penetration
or aspiration

Hiatal hernia:  Absent

GE reflux:  None

Other:  N/A
IMPRESSION: Normal exam.

## 2018-03-03 DIAGNOSIS — M25562 Pain in left knee: Secondary | ICD-10-CM | POA: Diagnosis not present

## 2018-03-04 DIAGNOSIS — M25562 Pain in left knee: Secondary | ICD-10-CM | POA: Diagnosis not present

## 2018-03-09 DIAGNOSIS — S83242D Other tear of medial meniscus, current injury, left knee, subsequent encounter: Secondary | ICD-10-CM | POA: Diagnosis not present

## 2018-03-09 DIAGNOSIS — M25562 Pain in left knee: Secondary | ICD-10-CM | POA: Diagnosis not present

## 2018-03-31 DIAGNOSIS — J209 Acute bronchitis, unspecified: Secondary | ICD-10-CM | POA: Diagnosis not present

## 2021-06-25 ENCOUNTER — Encounter (INDEPENDENT_AMBULATORY_CARE_PROVIDER_SITE_OTHER): Payer: Self-pay | Admitting: *Deleted

## 2021-07-12 ENCOUNTER — Other Ambulatory Visit (INDEPENDENT_AMBULATORY_CARE_PROVIDER_SITE_OTHER): Payer: Self-pay

## 2021-07-12 DIAGNOSIS — Z86010 Personal history of colonic polyps: Secondary | ICD-10-CM

## 2021-07-12 DIAGNOSIS — Z800 Family history of malignant neoplasm of digestive organs: Secondary | ICD-10-CM

## 2021-07-15 ENCOUNTER — Other Ambulatory Visit (INDEPENDENT_AMBULATORY_CARE_PROVIDER_SITE_OTHER): Payer: Self-pay | Admitting: *Deleted

## 2021-07-15 ENCOUNTER — Encounter (INDEPENDENT_AMBULATORY_CARE_PROVIDER_SITE_OTHER): Payer: Self-pay | Admitting: *Deleted

## 2021-07-15 NOTE — Telephone Encounter (Signed)
Patient needs suprep 

## 2021-07-17 ENCOUNTER — Telehealth (INDEPENDENT_AMBULATORY_CARE_PROVIDER_SITE_OTHER): Payer: Self-pay | Admitting: *Deleted

## 2021-07-17 MED ORDER — NA SULFATE-K SULFATE-MG SULF 17.5-3.13-1.6 GM/177ML PO SOLN: 1.0000 | Freq: Once | ORAL | 0 refills | Status: AC

## 2021-07-17 NOTE — Telephone Encounter (Signed)
Referring MD/PCP: sasser ? ?Procedure: tcs ? ?Reason/Indication:  hx polyps, fa, hx colon ca ? ?Has patient had this procedure before?  Yes, 06/2016 ? If so, when, by whom and where?   ? ?Is there a family history of colon cancer?  Yes, mother ? Who?  What age when diagnosed?   ? ?Is patient diabetic? If yes, Type 1 or Type 2   no ?     ?Does patient have prosthetic heart valve or mechanical valve?  no ? ?Do you have a pacemaker/defibrillator?  no ? ?Has patient ever had endocarditis/atrial fibrillation? no ? ?Does patient use oxygen? no ? ?Has patient had joint replacement within last 12 months?  no ? ?Is patient constipated or do they take laxatives? no ? ?Does patient have a history of alcohol/drug use?  no ? ?Have you had a stroke/heart attack last 6 mths? no ? ?Do you take medicine for weight loss?  no ? ?For female patients,: have you had a hysterectomy yes ?                     are you post menopausal  ?                     do you still have your menstrual cycle no ? ?Is patient on blood thinner such as Coumadin, Plavix and/or Aspirin? no ? ?Medications: sertraline 50 mg daily ? ?Allergies: demerol, epinepherine, prednisone, sulfa ? ?Medication Adjustment per Dr Rehman/Dr Jenetta Downer  ? ?Procedure date & time: 08/07/21 ? ? ?

## 2021-07-31 NOTE — Patient Instructions (Signed)
? ? ? ? ? ? Yvette Curry ? 07/31/2021  ?  ? @PREFPERIOPPHARMACY @ ? ? Your procedure is scheduled on  08/07/2021. ? ? Report to [facility] at  1230 P.M. ? ? Call this number if you have problems the morning of surgery: ? [phone] ? ? Remember: ? Follow the diet and prep instructions given to you by the office. ?  ? Take these medicines the morning of surgery with A SIP OF WATER  ? ?zoloft. ?  ? Do not wear jewelry, make-up or nail polish. ? Do not wear lotions, powders, or perfumes, or deodorant. ? Do not shave 48 hours prior to surgery.  Men may shave face and neck. ? Do not bring valuables to the hospital. ? [facility] is not responsible for any belongings or valuables. ? ?Contacts, dentures or bridgework may not be worn into surgery.  Leave your suitcase in the car.  After surgery it may be brought to your room. ? ?For patients admitted to the hospital, discharge time will be determined by your treatment team. ? ?Patients discharged the day of surgery will not be allowed to drive home and must have someone with them for 24 hours.  ? ? ?Special instructions:   DO NOT smoke tobacco or vape for 24 hours before your procedure. ? ?Please read over the following fact sheets that you were given. ?Anesthesia Post-op Instructions and Care and Recovery After Surgery ?  ? ? ? Colonoscopy, Adult, Care After ?This sheet gives you information about how to care for yourself after your procedure. Your health care provider may also give you more specific instructions. If you have problems or questions, contact your health care provider. ?What can I expect after the procedure? ?After the procedure, it is common to have: ?A small amount of blood in your stool for 24 hours after the procedure. ?Some gas. ?Mild cramping or bloating of your abdomen. ?Follow these instructions at home: ?Eating and drinking ? ?Drink enough fluid to keep your urine pale yellow. ?Follow instructions from your health care provider about eating or  drinking restrictions. ?Resume your normal diet as instructed by your health care provider. Avoid heavy or fried foods that are hard to digest. ?Activity ?Rest as told by your health care provider. ?Avoid sitting for a long time without moving. Get up to take short walks every 1-2 hours. This is important to improve blood flow and breathing. Ask for help if you feel weak or unsteady. ?Return to your normal activities as told by your health care provider. Ask your health care provider what activities are safe for you. ?Managing cramping and bloating ? ?Try walking around when you have cramps or feel bloated. ?Apply heat to your abdomen as told by your health care provider. Use the heat source that your health care provider recommends, such as a moist heat pack or a heating pad. ?Place a towel between your skin and the heat source. ?Leave the heat on for 20-30 minutes. ?Remove the heat if your skin turns bright red. This is especially important if you are unable to feel pain, heat, or cold. You may have a greater risk of getting burned. ?General instructions ?If you were given a sedative during the procedure, it can affect you for several hours. Do not drive or operate machinery until your health care provider says that it is safe. ?For the first 24 hours after the procedure: ?Do not sign important documents. ?Do not drink alcohol. ?Do your regular daily  activities at a slower pace than normal. ?Eat soft foods that are easy to digest. ?Take over-the-counter and prescription medicines only as told by your health care provider. ?Keep all follow-up visits as told by your health care provider. This is important. ?Contact a health care provider if: ?You have blood in your stool 2-3 days after the procedure. ?Get help right away if you have: ?More than a small spotting of blood in your stool. ?Large blood clots in your stool. ?Swelling of your abdomen. ?Nausea or vomiting. ?A fever. ?Increasing pain in your abdomen that is  not relieved with medicine. ?Summary ?After the procedure, it is common to have a small amount of blood in your stool. You may also have mild cramping and bloating of your abdomen. ?If you were given a sedative during the procedure, it can affect you for several hours. Do not drive or operate machinery until your health care provider says that it is safe. ?Get help right away if you have a lot of blood in your stool, nausea or vomiting, a fever, or increased pain in your abdomen. ?This information is not intended to replace advice given to you by your health care provider. Make sure you discuss any questions you have with your health care provider. ?Document Revised: 02/11/2019 Document Reviewed: 11/01/2018 ?Elsevier Patient Education ? [place]. ?Monitored Anesthesia Care, Care After ?This sheet gives you information about how to care for yourself after your procedure. Your health care provider may also give you more specific instructions. If you have problems or questions, contact your health care provider. ?What can I expect after the procedure? ?After the procedure, it is common to have: ?Tiredness. ?Forgetfulness about what happened after the procedure. ?Impaired judgment for important decisions. ?Nausea or vomiting. ?Some difficulty with balance. ?Follow these instructions at home: ?For the time period you were told by your health care provider: ?  ?Rest as needed. ?Do not participate in activities where you could fall or become injured. ?Do not drive or use machinery. ?Do not drink alcohol. ?Do not take sleeping pills or medicines that cause drowsiness. ?Do not make important decisions or sign legal documents. ?Do not take care of children on your own. ?Eating and drinking ?Follow the diet that is recommended by your health care provider. ?Drink enough fluid to keep your urine pale yellow. ?If you vomit: ?Drink water, juice, or soup when you can drink without vomiting. ?Make sure you have little or  no nausea before eating solid foods. ?General instructions ?Have a responsible adult stay with you for the time you are told. It is important to have someone help care for you until you are awake and alert. ?Take over-the-counter and prescription medicines only as told by your health care provider. ?If you have sleep apnea, surgery and certain medicines can increase your risk for breathing problems. Follow instructions from your health care provider about wearing your sleep device: ?Anytime you are sleeping, including during daytime naps. ?While taking prescription pain medicines, sleeping medicines, or medicines that make you drowsy. ?Avoid smoking. ?Keep all follow-up visits as told by your health care provider. This is important. ?Contact a health care provider if: ?You keep feeling nauseous or you keep vomiting. ?You feel light-headed. ?You are still sleepy or having trouble with balance after 24 hours. ?You develop a rash. ?You have a fever. ?You have redness or swelling around the IV site. ?Get help right away if: ?You have trouble breathing. ?You have new-onset confusion at home. ?Summary ?For  several hours after your procedure, you may feel tired. You may also be forgetful and have poor judgment. ?Have a responsible adult stay with you for the time you are told. It is important to have someone help care for you until you are awake and alert. ?Rest as told. Do not drive or operate machinery. Do not drink alcohol or take sleeping pills. ?Get help right away if you have trouble breathing, or if you suddenly become confused. ?This information is not intended to replace advice given to you by your health care provider. Make sure you discuss any questions you have with your health care provider. ?Document Revised: 12/22/2019 Document Reviewed: 03/10/2019 ?Elsevier Patient Education ? [place]. ? ?

## 2021-08-01 ENCOUNTER — Other Ambulatory Visit: Payer: Self-pay

## 2021-08-01 ENCOUNTER — Encounter (HOSPITAL_COMMUNITY): Payer: Self-pay

## 2021-08-02 ENCOUNTER — Encounter (HOSPITAL_COMMUNITY)
Admission: RE | Admit: 2021-08-02 | Discharge: 2021-08-02 | Disposition: A | Discharge status: Home / Self Care | Payer: 59 | Source: Ambulatory Visit | Attending: Internal Medicine | Admitting: Internal Medicine

## 2021-08-07 ENCOUNTER — Ambulatory Visit (HOSPITAL_BASED_OUTPATIENT_CLINIC_OR_DEPARTMENT_OTHER): Payer: 59 | Admitting: Anesthesiology

## 2021-08-07 ENCOUNTER — Ambulatory Visit (HOSPITAL_COMMUNITY): Payer: 59 | Admitting: Anesthesiology

## 2021-08-07 ENCOUNTER — Encounter (HOSPITAL_COMMUNITY): Payer: Self-pay | Admitting: Internal Medicine

## 2021-08-07 ENCOUNTER — Other Ambulatory Visit: Payer: Self-pay

## 2021-08-07 ENCOUNTER — Telehealth (INDEPENDENT_AMBULATORY_CARE_PROVIDER_SITE_OTHER): Payer: Self-pay

## 2021-08-07 ENCOUNTER — Encounter (HOSPITAL_COMMUNITY)
Admission: RE | Disposition: A | Discharge status: Home / Self Care | Payer: Self-pay | Source: Ambulatory Visit | Attending: Internal Medicine

## 2021-08-07 ENCOUNTER — Ambulatory Visit (HOSPITAL_COMMUNITY)
Admission: RE | Admit: 2021-08-07 | Discharge: 2021-08-07 | Disposition: A | Discharge status: Home / Self Care | Payer: 59 | Source: Ambulatory Visit | Attending: Internal Medicine | Admitting: Internal Medicine

## 2021-08-07 DIAGNOSIS — Z1211 Encounter for screening for malignant neoplasm of colon: Secondary | ICD-10-CM | POA: Diagnosis not present

## 2021-08-07 DIAGNOSIS — K219 Gastro-esophageal reflux disease without esophagitis: Secondary | ICD-10-CM | POA: Insufficient documentation

## 2021-08-07 DIAGNOSIS — Z91198 Patient's noncompliance with other medical treatment and regimen for other reason: Secondary | ICD-10-CM | POA: Diagnosis not present

## 2021-08-07 DIAGNOSIS — D128 Benign neoplasm of rectum: Secondary | ICD-10-CM | POA: Insufficient documentation

## 2021-08-07 DIAGNOSIS — K635 Polyp of colon: Secondary | ICD-10-CM

## 2021-08-07 DIAGNOSIS — D125 Benign neoplasm of sigmoid colon: Secondary | ICD-10-CM | POA: Diagnosis not present

## 2021-08-07 DIAGNOSIS — Z800 Family history of malignant neoplasm of digestive organs: Secondary | ICD-10-CM | POA: Diagnosis not present

## 2021-08-07 DIAGNOSIS — F17210 Nicotine dependence, cigarettes, uncomplicated: Secondary | ICD-10-CM | POA: Diagnosis not present

## 2021-08-07 DIAGNOSIS — F32A Depression, unspecified: Secondary | ICD-10-CM | POA: Insufficient documentation

## 2021-08-07 DIAGNOSIS — Z79899 Other long term (current) drug therapy: Secondary | ICD-10-CM | POA: Diagnosis not present

## 2021-08-07 DIAGNOSIS — K621 Rectal polyp: Secondary | ICD-10-CM

## 2021-08-07 DIAGNOSIS — Z86010 Personal history of colonic polyps: Secondary | ICD-10-CM | POA: Insufficient documentation

## 2021-08-07 DIAGNOSIS — F419 Anxiety disorder, unspecified: Secondary | ICD-10-CM | POA: Insufficient documentation

## 2021-08-07 HISTORY — PX: POLYPECTOMY: SHX5525

## 2021-08-07 HISTORY — PX: COLONOSCOPY WITH PROPOFOL: SHX5780

## 2021-08-07 SURGERY — COLONOSCOPY WITH PROPOFOL
Anesthesia: General

## 2021-08-07 MED ORDER — PROPOFOL 500 MG/50ML IV EMUL
INTRAVENOUS | Status: DC | PRN
  Administered 2021-08-07: 150 ug/kg/min via INTRAVENOUS

## 2021-08-07 MED ORDER — PROPOFOL 10 MG/ML IV BOLUS
INTRAVENOUS | Status: DC | PRN
  Administered 2021-08-07: 30 mg via INTRAVENOUS

## 2021-08-07 MED ORDER — LACTATED RINGERS IV SOLN: INTRAVENOUS | Status: DC

## 2021-08-07 MED ORDER — PEG 3350-KCL-NA BICARB-NACL 420 G PO SOLR: 4000.0000 mL | Freq: Once | ORAL | Status: DC

## 2021-08-07 MED ORDER — GOLYTELY 236 G PO SOLR: 4.0000 L | Freq: Once | ORAL | 0 refills | Status: AC

## 2021-08-07 MED ORDER — PEG 3350-KCL-NA BICARB-NACL 420 G PO SOLR: 4000.0000 mL | ORAL | 0 refills | Status: DC

## 2021-08-07 NOTE — Anesthesia Postprocedure Evaluation (Signed)
Anesthesia Post Note ? ?Patient: Yvette Curry ? ?Procedure(s) Performed: COLONOSCOPY WITH PROPOFOL ?POLYPECTOMY ? ?Patient location during evaluation: Short Stay ?Anesthesia Type: General ?Level of consciousness: awake and alert ?Pain management: pain level controlled ?Vital Signs Assessment: post-procedure vital signs reviewed and stable ?Respiratory status: spontaneous breathing ?Cardiovascular status: blood pressure returned to baseline and stable ?Postop Assessment: no apparent nausea or vomiting ?Anesthetic complications: no ? ? ?No notable events documented. ? ? ?Last Vitals:  ?Vitals:  ? 08/07/21 1314 08/07/21 1407  ?BP: 134/83 92/61  ?Pulse: 91 80  ?Resp: 12 (!) 21  ?Temp: 36.9 ?C 36.7 ?C  ?SpO2: 95% 93%  ?  ?Last Pain:  ?Vitals:  ? 08/07/21 1407  ?TempSrc: Oral  ?PainSc: 0-No pain  ? ? ?  ?  ?  ?  ?  ?  ? ?Levin Dagostino ? ? ? ? ?

## 2021-08-07 NOTE — Op Note (Signed)
[facility] ?Patient Name: Yvette Curry ?Procedure Date: 08/07/2021 12:54 PM ?MRN: [id number] ?Date of Birth: 1955-07-15 ?Attending MD: Hildred Laser , MD ?CSN: [id number] ?Age: 66 ?Admit Type: Outpatient ?Procedure:                Colonoscopy ?Indications:              High risk colon cancer surveillance: Personal  ?                          history of colonic polyps ?Providers:                Hildred Laser, MD, Caprice Kluver, Nelma Rothman, Technician ?Referring MD:              ?Medicines:                Propofol per Anesthesia ?Complications:            No immediate complications. ?Estimated Blood Loss:     Estimated blood loss was minimal. ?Procedure:                Pre-Anesthesia Assessment: ?                          - Prior to the procedure, a History and Physical  ?                          was performed, and patient medications and  ?                          allergies were reviewed. The patient's tolerance of  ?                          previous anesthesia was also reviewed. The risks  ?                          and benefits of the procedure and the sedation  ?                          options and risks were discussed with the patient.  ?                          All questions were answered, and informed consent  ?                          was obtained. Prior Anticoagulants: The patient has  ?                          taken no previous anticoagulant or antiplatelet  ?                          agents. ASA Grade Assessment: II - A patient with  ?                          mild systemic disease. After reviewing the risks  ?  and benefits, the patient was deemed in  ?                          satisfactory condition to undergo the procedure. ?                          After obtaining informed consent, the colonoscope  ?                          was passed under direct vision. Throughout the  ?                          procedure, the patient's blood pressure, pulse, and  ?                           oxygen saturations were monitored continuously. The  ?                          [id number]-HQ190L ([id number]) scope was introduced through  ?                          the anus and advanced to the the ileocecal valve.  ?                          The colonoscopy was performed without difficulty.  ?                          The patient tolerated the procedure well. The  ?                          quality of the bowel preparation was fair. The  ?                          ileocecal valve and the rectum were photographed. ?Scope In: 1:46:52 PM ?Scope Out: 2:00:48 PM ?Scope Withdrawal Time: 0 hours 5 minutes 15 seconds  ?Total Procedure Duration: 0 hours 13 minutes 56 seconds  ?Findings: ?     The perianal and digital rectal examinations were normal. ?     A 15 mm polyp was found in the proximal ascending colon. The polyp was  ?     multi-lobulated. Polypectomy was not attempted due to inadequate bowel  ?     preparation. ?     A 15 mm polyp was found in the sigmoid colon. The polyp was  ?     semi-pedunculated. Polypectomy was not attempted due to inadequate bowel  ?     preparation. ?     A small polyp was found in the sigmoid colon. Biopsies were taken with a  ?     cold forceps for histology. The pathology specimen was placed into  ?     Bottle Number 1. ?     A 6 mm polyp was found in the rectum. The polyp was sessile. The polyp  ?     was removed with a cold snare. Resection and retrieval were complete.  ?     The pathology specimen was placed into Bottle Number 1. ?Impression:               -  Preparation of the colon was fair. ?                          - One 15 mm polyp in the proximal ascending colon.  ?                          Resection not attempted. ?                          - One 15 mm polyp in the sigmoid colon. Resection  ?                          not attempted. ?                          - One small polyp in the sigmoid colon. Biopsied. ?                          - One 6 mm polyp in the rectum, removed  with a cold  ?                          snare. Resected and retrieved. ?                          Comment; larger polyps would required a hot snare  ?                          polypectomy. These polyps were not removed because  ?                          of poor prep. ?Moderate Sedation: ?     Per Anesthesia Care ?Recommendation:           - Patient has a contact number available for  ?                          emergencies. The signs and symptoms of potential  ?                          delayed complications were discussed with the  ?                          patient. Return to normal activities tomorrow.  ?                          Written discharge instructions were provided to the  ?                          patient. ?                          - Clear liquid diet today. ?                          - Continue present medications. ?                          -  Repeat colonoscopy at appointment to be scheduled  ?                          for surveillance. ?                          - Await pathology results. ?Procedure Code(s):        --- Professional --- ?                          [phone], Colonoscopy, flexible; with removal of  ?                          tumor(s), polyp(s), or other lesion(s) by snare  ?                          technique ?                          45380, 59, Colonoscopy, flexible; with biopsy,  ?                          single or multiple ?Diagnosis Code(s):        --- Professional --- ?                          Z86.010, Personal history of colonic polyps ?                          K63.5, Polyp of colon ?                          K62.1, Rectal polyp ?CPT copyright 2019 [employer]. All rights reserved. ?The codes documented in this report are preliminary and upon coder review may  ?be revised to meet current compliance requirements. ?Hildred Laser, MD ?Hildred Laser, MD ?08/07/2021 2:09:44 PM ?This report has been signed electronically. ?Number of Addenda: 0 ?

## 2021-08-07 NOTE — Telephone Encounter (Signed)
Destina Mantei Ann Jeziah Kretschmer, CMA  ?

## 2021-08-07 NOTE — Telephone Encounter (Signed)
Amanee Iacovelli Ann Rosalynn Sergent, CMA  ?

## 2021-08-07 NOTE — Discharge Instructions (Addendum)
No aspirin or NSAIDs for 24 hours ?Resume usual medications. ?Repeat colonoscopy to be scheduled; ?Physician will call with biopsy results. ?
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
## 2021-08-07 NOTE — Anesthesia Preprocedure Evaluation (Signed)
Anesthesia Evaluation  ?Patient identified by MRN, date of birth, ID band ?Patient awake ? ? ? ?Reviewed: ?Allergy & Precautions, H&P , NPO status , Patient's Chart, lab work & pertinent test results, reviewed documented beta blocker date and time  ? ?History of Anesthesia Complications ?(+) PONV and history of anesthetic complications ? ?Airway ?Mallampati: II ? ?TM Distance: >3 FB ?Neck ROM: full ? ? ? Dental ?no notable dental hx. ? ?  ?Pulmonary ?shortness of breath, Current Smoker,  ?  ?Pulmonary exam normal ?breath sounds clear to auscultation ? ? ? ? ? ? Cardiovascular ?Exercise Tolerance: Good ?negative cardio ROS ? ? ?Rhythm:regular Rate:Normal ? ? ?  ?Neuro/Psych ?PSYCHIATRIC DISORDERS Anxiety Depression negative neurological ROS ?   ? GI/Hepatic ?Neg liver ROS, GERD  Medicated,  ?Endo/Other  ?negative endocrine ROS ? Renal/GU ?negative Renal ROS  ?negative genitourinary ?  ?Musculoskeletal ? ? Abdominal ?  ?Peds ? Hematology ?negative hematology ROS ?(+)   ?Anesthesia Other Findings ? ? Reproductive/Obstetrics ?negative OB ROS ? ?  ? ? ? ? ? ? ? ? ? ? ? ? ? ?  ?  ? ? ? ? ? ? ? ? ?Anesthesia Physical ?Anesthesia Plan ? ?ASA: 2 ? ?Anesthesia Plan: General  ? ?Post-op Pain Management:   ? ?Induction:  ? ?PONV Risk Score and Plan: Propofol infusion ? ?Airway Management Planned:  ? ?Additional Equipment:  ? ?Intra-op Plan:  ? ?Post-operative Plan:  ? ?Informed Consent: I have reviewed the patients History and Physical, chart, labs and discussed the procedure including the risks, benefits and alternatives for the proposed anesthesia with the patient or authorized representative who has indicated his/her understanding and acceptance.  ? ? ? ?Dental Advisory Given ? ?Plan Discussed with: CRNA ? ?Anesthesia Plan Comments:   ? ? ? ? ? ? ?Anesthesia Quick Evaluation ? ?

## 2021-08-07 NOTE — Transfer of Care (Signed)
Immediate Anesthesia Transfer of Care Note ? ?Patient: Yvette Curry ? ?Procedure(s) Performed: COLONOSCOPY WITH PROPOFOL ?POLYPECTOMY ? ?Patient Location: Short Stay ? ?Anesthesia Type:General ? ?Level of Consciousness: awake ? ?Airway & Oxygen Therapy: Patient Spontanous Breathing ? ?Post-op Assessment: Report given to RN ? ?Post vital signs: Reviewed and stable ? ?Last Vitals:  ?Vitals Value Taken Time  ?BP 92/61 08/07/21 1407  ?Temp 36.7 ?C 08/07/21 1407  ?Pulse 80 08/07/21 1407  ?Resp 21 08/07/21 1407  ?SpO2 93 % 08/07/21 1407  ? ? ?Last Pain:  ?Vitals:  ? 08/07/21 1407  ?TempSrc: Oral  ?PainSc: 0-No pain  ?   ? ?  ? ?Complications: No notable events documented. ?

## 2021-08-08 ENCOUNTER — Other Ambulatory Visit: Payer: Self-pay

## 2021-08-08 ENCOUNTER — Encounter (HOSPITAL_COMMUNITY): Payer: Self-pay | Admitting: Internal Medicine

## 2021-08-08 ENCOUNTER — Encounter (HOSPITAL_COMMUNITY)
Admission: RE | Disposition: A | Discharge status: Home / Self Care | Payer: Self-pay | Source: Home / Self Care | Attending: Internal Medicine

## 2021-08-08 ENCOUNTER — Ambulatory Visit (HOSPITAL_COMMUNITY)
Admission: RE | Admit: 2021-08-08 | Discharge: 2021-08-08 | Disposition: A | Discharge status: Home / Self Care | Payer: 59 | Attending: Internal Medicine | Admitting: Internal Medicine

## 2021-08-08 ENCOUNTER — Ambulatory Visit (HOSPITAL_BASED_OUTPATIENT_CLINIC_OR_DEPARTMENT_OTHER): Payer: 59 | Admitting: Anesthesiology

## 2021-08-08 ENCOUNTER — Ambulatory Visit (HOSPITAL_COMMUNITY): Payer: 59 | Admitting: Anesthesiology

## 2021-08-08 DIAGNOSIS — Z86010 Personal history of colonic polyps: Secondary | ICD-10-CM

## 2021-08-08 DIAGNOSIS — K644 Residual hemorrhoidal skin tags: Secondary | ICD-10-CM

## 2021-08-08 DIAGNOSIS — D175 Benign lipomatous neoplasm of intra-abdominal organs: Secondary | ICD-10-CM | POA: Insufficient documentation

## 2021-08-08 DIAGNOSIS — K635 Polyp of colon: Secondary | ICD-10-CM

## 2021-08-08 DIAGNOSIS — Z800 Family history of malignant neoplasm of digestive organs: Secondary | ICD-10-CM | POA: Insufficient documentation

## 2021-08-08 DIAGNOSIS — D124 Benign neoplasm of descending colon: Secondary | ICD-10-CM | POA: Insufficient documentation

## 2021-08-08 DIAGNOSIS — K5730 Diverticulosis of large intestine without perforation or abscess without bleeding: Secondary | ICD-10-CM

## 2021-08-08 DIAGNOSIS — Z1211 Encounter for screening for malignant neoplasm of colon: Secondary | ICD-10-CM | POA: Diagnosis present

## 2021-08-08 DIAGNOSIS — D120 Benign neoplasm of cecum: Secondary | ICD-10-CM | POA: Diagnosis not present

## 2021-08-08 DIAGNOSIS — K219 Gastro-esophageal reflux disease without esophagitis: Secondary | ICD-10-CM | POA: Diagnosis not present

## 2021-08-08 DIAGNOSIS — F17200 Nicotine dependence, unspecified, uncomplicated: Secondary | ICD-10-CM | POA: Insufficient documentation

## 2021-08-08 DIAGNOSIS — D122 Benign neoplasm of ascending colon: Secondary | ICD-10-CM | POA: Diagnosis not present

## 2021-08-08 HISTORY — PX: HEMOSTASIS CLIP PLACEMENT: SHX6857

## 2021-08-08 HISTORY — PX: COLONOSCOPY WITH PROPOFOL: SHX5780

## 2021-08-08 HISTORY — PX: HOT HEMOSTASIS: SHX5433

## 2021-08-08 HISTORY — PX: POLYPECTOMY: SHX149

## 2021-08-08 SURGERY — COLONOSCOPY WITH PROPOFOL
Anesthesia: General

## 2021-08-08 MED ORDER — PROPOFOL 10 MG/ML IV BOLUS
INTRAVENOUS | Status: DC | PRN
  Administered 2021-08-08: 100 mg via INTRAVENOUS

## 2021-08-08 MED ORDER — LACTATED RINGERS IV SOLN
INTRAVENOUS | Status: DC
  Administered 2021-08-08: 1000 mL via INTRAVENOUS

## 2021-08-08 MED ORDER — PROPOFOL 500 MG/50ML IV EMUL
INTRAVENOUS | Status: DC | PRN
  Administered 2021-08-08: 150 ug/kg/min via INTRAVENOUS

## 2021-08-08 MED ORDER — PROPOFOL 1000 MG/100ML IV EMUL
INTRAVENOUS | Status: AC
  Filled 2021-08-08: qty 100

## 2021-08-08 NOTE — Anesthesia Preprocedure Evaluation (Signed)
Anesthesia Evaluation  ?Patient identified by MRN, date of birth, ID band ?Patient awake ? ? ? ?Reviewed: ?Allergy & Precautions, H&P , NPO status , Patient's Chart, lab work & pertinent test results, reviewed documented beta blocker date and time  ? ?History of Anesthesia Complications ?(+) PONV and history of anesthetic complications ? ?Airway ?Mallampati: II ? ?TM Distance: >3 FB ?Neck ROM: full ? ? ? Dental ?no notable dental hx. ? ?  ?Pulmonary ?shortness of breath, Current Smoker,  ?  ?Pulmonary exam normal ?breath sounds clear to auscultation ? ? ? ? ? ? Cardiovascular ?Exercise Tolerance: Good ?negative cardio ROS ? ? ?Rhythm:regular Rate:Normal ? ? ?  ?Neuro/Psych ?PSYCHIATRIC DISORDERS Anxiety Depression negative neurological ROS ?   ? GI/Hepatic ?Neg liver ROS, GERD  Medicated,  ?Endo/Other  ?negative endocrine ROS ? Renal/GU ?negative Renal ROS  ?negative genitourinary ?  ?Musculoskeletal ? ? Abdominal ?  ?Peds ? Hematology ?negative hematology ROS ?(+)   ?Anesthesia Other Findings ? ? Reproductive/Obstetrics ?negative OB ROS ? ?  ? ? ? ? ? ? ? ? ? ? ? ? ? ?  ?  ? ? ? ? ? ? ? ? ?Anesthesia Physical ? ?Anesthesia Plan ? ?ASA: 2 ? ?Anesthesia Plan: General  ? ?Post-op Pain Management:   ? ?Induction:  ? ?PONV Risk Score and Plan: Propofol infusion ? ?Airway Management Planned:  ? ?Additional Equipment:  ? ?Intra-op Plan:  ? ?Post-operative Plan:  ? ?Informed Consent: I have reviewed the patients History and Physical, chart, labs and discussed the procedure including the risks, benefits and alternatives for the proposed anesthesia with the patient or authorized representative who has indicated his/her understanding and acceptance.  ? ? ? ?Dental Advisory Given ? ?Plan Discussed with: CRNA ? ?Anesthesia Plan Comments:   ? ? ? ? ? ? ?Anesthesia Quick Evaluation ? ?
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
## 2021-08-08 NOTE — Discharge Instructions (Signed)
No aspirin or NSAIDs for 1 week. ?Resume scheduled medications as before. ?High-fiber diet. ?No driving for 24 hours. ?Physician will call with biopsy results and further recommendations. ?

## 2021-08-08 NOTE — Transfer of Care (Signed)
Immediate Anesthesia Transfer of Care Note ? ?Patient: Yvette Curry ? ?Procedure(s) Performed: COLONOSCOPY WITH PROPOFOL ?POLYPECTOMY INTESTINAL ?HOT HEMOSTASIS (ARGON PLASMA COAGULATION/BICAP) ?HEMOSTASIS CLIP PLACEMENT ? ?Patient Location: PACU ? ?Anesthesia Type:General ? ?Level of Consciousness: awake, alert , oriented and patient cooperative ? ?Airway & Oxygen Therapy: Patient Spontanous Breathing ? ?Post-op Assessment: Report given to RN, Post -op Vital signs reviewed and stable and Patient moving all extremities X 4 ? ?Post vital signs: Reviewed and stable ? ?Last Vitals:  ?Vitals Value Taken Time  ?BP    ?Temp    ?Pulse    ?Resp    ?SpO2    ? ? ?Last Pain:  ?Vitals:  ? 08/08/21 1329  ?TempSrc:   ?PainSc: 0-No pain  ?   ? ?Patients Stated Pain Goal: 8 (08/08/21 1218) ? ?Complications: No notable events documented. ?

## 2021-08-08 NOTE — Op Note (Signed)
[facility] ?Patient Name: Yvette Curry ?Procedure Date: 08/08/2021 1:16 PM ?MRN: [id number] ?Date of Birth: Sep 06, 1955 ?Attending MD: Hildred Laser , MD ?CSN: [id number] ?Age: 66 ?Admit Type: Outpatient ?Procedure:                Colonoscopy ?Indications:              High risk colon cancer surveillance: Personal  ?                          history of colonic polyps ?Providers:                Hildred Laser, MD, Crystal Page, Nelma Rothman,  ?                          Technician ?Referring MD:             Consuello Masse, MD ?Medicines:                Propofol per Anesthesia ?Complications:            No immediate complications. ?Estimated Blood Loss:     Estimated blood loss was minimal. ?Procedure:                Pre-Anesthesia Assessment: ?                          - Prior to the procedure, a History and Physical  ?                          was performed, and patient medications and  ?                          allergies were reviewed. The patient's tolerance of  ?                          previous anesthesia was also reviewed. The risks  ?                          and benefits of the procedure and the sedation  ?                          options and risks were discussed with the patient.  ?                          All questions were answered, and informed consent  ?                          was obtained. Prior Anticoagulants: The patient has  ?                          taken no previous anticoagulant or antiplatelet  ?                          agents. ASA Grade Assessment: II - A patient with  ?  mild systemic disease. After reviewing the risks  ?                          and benefits, the patient was deemed in  ?                          satisfactory condition to undergo the procedure. ?                          After obtaining informed consent, the colonoscope  ?                          was passed under direct vision. Throughout the  ?                          procedure, the patient's  blood pressure, pulse, and  ?                          oxygen saturations were monitored continuously. The  ?                          PCF-HQ190L ([id number]) scope was introduced through  ?                          the anus and advanced to the the cecum, identified  ?                          by appendiceal orifice and ileocecal valve. The  ?                          colonoscopy was performed without difficulty. The  ?                          patient tolerated the procedure well. The quality  ?                          of the bowel preparation was good. The ileocecal  ?                          valve, appendiceal orifice, and rectum were  ?                          photographed. ?Scope In: 1:35:46 PM ?Scope Out: 2:35:31 PM ?Scope Withdrawal Time: 0 hours 54 minutes 9 seconds  ?Total Procedure Duration: 0 hours 59 minutes 45 seconds  ?Findings: ?     The perianal and digital rectal examinations were normal. ?     Two flat polyps were found in the ascending colon and cecum. The polyps  ?     were small in size. These were biopsied with a cold forceps for  ?     histology. The pathology specimen was placed into Bottle Number 1. ?     Two flat polyps were found in the ascending colon. The polyps were 4 to  ?     6 mm in size. These polyps were  removed with a cold snare. Resection and  ?     retrieval were complete. The pathology specimen was placed into Bottle  ?     Number 1. ?     A 15 to 25 mm polyp was found in the proximal ascending colon. The polyp  ?     was multi-lobulated. Area was successfully injected with 10 mL Eleview  ?     for lesion assessment, and this injection appeared to lift the lesion  ?     adequately. The polyp was removed with a piecemeal technique using a hot  ?     snare. Resection and retrieval were complete using a Roth net. The  ?     pathology specimen was placed into Bottle Number 2. Coagulation for  ?     [Purpose]treat margins using argon plasma was successful. ?     A 12 to 20 mm polyp  was found in the mid descending colon. The polyp was  ?     multi-lobulated. Area was successfully injected with 8 mL Eleview for  ?     lesion assessment, and this injection appeared to lift the lesion  ?     adequately. The polyp was removed with a piecemeal technique using a hot  ?     snare. Resection and retrieval were complete using a Roth net. One  ?     hemostatic clip was successfully placed (MR conditional). The pathology  ?     specimen was placed into Bottle Number 3. Polypectomy margin treated  ?     with APC. ?     A few diverticula were found in the sigmoid colon. ?     External hemorrhoids were found during retroflexion. The hemorrhoids  ?     were small. ?     - Medium size lipoma noted at hepatic flexure. ?Impression:               - Two small polyps in the ascending colon and in  ?                          the cecum. Biopsied. ?                          - Two 4 to 6 mm polyps in the ascending colon,  ?                          removed with a cold snare. Resected and retrieved. ?                          - One 15 to 25 mm polyp in the proximal ascending  ?                          colon, removed piecemeal using a hot snare.  ?                          Resected and retrieved. Injected. Treated with  ?                          argon plasma coagulation (APC). ?                          -  One 12 to 20 mm polyp in the mid descending  ?                          colon, removed piecemeal using a hot snare.  ?                          Resected and retrieved. Injected. Polypectomy clip  ?                          (MR conditional) was placed. . ?                          - Diverticulosis in the sigmoid colon. ?                          - External hemorrhoids and anal papillea. ?                          - Lipoma at hepatic flexure. ?Moderate Sedation: ?     Per Anesthesia Care ?Recommendation:           - Patient has a contact number available for  ?                          emergencies. The signs and  symptoms of potential  ?                          delayed complications were discussed with the  ?                          patient. Return to normal activities tomorrow.  ?                          Written discharge instructions were provided to the  ?                          patient. ?                          - High fiber diet today. ?                          - Continue present medications. ?                          - No aspirin, ibuprofen, naproxen, or other  ?                          non-steroidal anti-inflammatory drugs for 7 days. ?                          - Await pathology results. ?                          - Repeat colonoscopy is recommended. The  ?  colonoscopy date will be determined after pathology  ?                          results from today's exam become available for  ?                          review. ?Procedure Code(s):        --- Professional --- ?                          [id number], Colonoscopy, flexible; with removal of  ?                          tumor(s), polyp(s), or other lesion(s) by snare  ?                          technique ?                          45380, 59, Colonoscopy, flexible; with biopsy,  ?                          single or multiple ?                          45381, Colonoscopy, flexible; with directed  ?                          submucosal injection(s), any substance ?Diagnosis Code(s):        --- Professional --- ?                          K63.5, Polyp of colon ?                          Z86.010, Personal history of colonic polyps ?                          K64.4, Residual hemorrhoidal skin tags ?                          K57.30, Diverticulosis of large intestine without  ?                          perforation or abscess without bleeding ?CPT copyright 2019 [employer]. All rights reserved. ?The codes documented in this report are preliminary and upon coder review may  ?be revised to meet current compliance requirements. ?Hildred Laser, MD ?Hildred Laser, MD ?08/08/2021 2:55:37 PM ?This report has been signed electronically. ?Number of Addenda: 0 ?

## 2021-08-09 ENCOUNTER — Encounter (HOSPITAL_COMMUNITY): Payer: Self-pay | Admitting: Internal Medicine

## 2021-08-09 LAB — SURGICAL PATHOLOGY

## 2021-08-09 NOTE — Anesthesia Postprocedure Evaluation (Signed)
Anesthesia Post Note ? ?Patient: Yvette Curry ? ?Procedure(s) Performed: COLONOSCOPY WITH PROPOFOL ?POLYPECTOMY INTESTINAL ?HOT HEMOSTASIS (ARGON PLASMA COAGULATION/BICAP) ?HEMOSTASIS CLIP PLACEMENT ? ?Patient location during evaluation: Phase II ?Anesthesia Type: General ?Level of consciousness: awake ?Pain management: pain level controlled ?Vital Signs Assessment: post-procedure vital signs reviewed and stable ?Respiratory status: spontaneous breathing and respiratory function stable ?Cardiovascular status: blood pressure returned to baseline and stable ?Postop Assessment: no headache and no apparent nausea or vomiting ?Anesthetic complications: no ?Comments: Late entry ? ? ?No notable events documented. ? ? ?Last Vitals:  ?Vitals:  ? 08/08/21 1218 08/08/21 1438  ?BP: 116/81 117/68  ?Pulse: 86 84  ?Resp: 12 16  ?Temp: 37.1 ?C (!) 36.3 ?C  ?SpO2: 96% 95%  ?  ?Last Pain:  ?Vitals:  ? 08/08/21 1438  ?TempSrc: Axillary  ?PainSc: 0-No pain  ? ? ?  ?  ?  ?  ?  ?  ? ?Louann Sjogren ? ? ? ? ?

## 2021-08-12 ENCOUNTER — Encounter (HOSPITAL_COMMUNITY): Payer: Self-pay | Admitting: Internal Medicine

## 2021-08-12 LAB — SURGICAL PATHOLOGY

## 2021-08-14 ENCOUNTER — Other Ambulatory Visit (INDEPENDENT_AMBULATORY_CARE_PROVIDER_SITE_OTHER): Payer: Self-pay | Admitting: Internal Medicine

## 2021-08-14 MED ORDER — HYOSCYAMINE SULFATE 0.125 MG SL SUBL: 0.1250 mg | SUBLINGUAL_TABLET | Freq: Four times a day (QID) | SUBLINGUAL | 1 refills | Status: DC | PRN

## 2021-08-21 ENCOUNTER — Other Ambulatory Visit (INDEPENDENT_AMBULATORY_CARE_PROVIDER_SITE_OTHER): Payer: Self-pay | Admitting: Internal Medicine

## 2021-09-01 ENCOUNTER — Other Ambulatory Visit (INDEPENDENT_AMBULATORY_CARE_PROVIDER_SITE_OTHER): Payer: Self-pay | Admitting: Internal Medicine

## 2021-09-26 ENCOUNTER — Other Ambulatory Visit (INDEPENDENT_AMBULATORY_CARE_PROVIDER_SITE_OTHER): Payer: Self-pay

## 2021-09-26 ENCOUNTER — Ambulatory Visit (INDEPENDENT_AMBULATORY_CARE_PROVIDER_SITE_OTHER): Payer: 59 | Admitting: Gastroenterology

## 2021-09-26 ENCOUNTER — Encounter (INDEPENDENT_AMBULATORY_CARE_PROVIDER_SITE_OTHER): Payer: Self-pay | Admitting: Gastroenterology

## 2021-09-26 ENCOUNTER — Encounter (INDEPENDENT_AMBULATORY_CARE_PROVIDER_SITE_OTHER): Payer: Self-pay

## 2021-09-26 VITALS — BP 102/67 | HR 108 | Temp 97.9°F | Ht 60.0 in | Wt 161.8 lb

## 2021-09-26 DIAGNOSIS — R6881 Early satiety: Secondary | ICD-10-CM | POA: Insufficient documentation

## 2021-09-26 DIAGNOSIS — R110 Nausea: Secondary | ICD-10-CM

## 2021-09-26 DIAGNOSIS — K529 Noninfective gastroenteritis and colitis, unspecified: Secondary | ICD-10-CM | POA: Diagnosis not present

## 2021-09-26 DIAGNOSIS — K219 Gastro-esophageal reflux disease without esophagitis: Secondary | ICD-10-CM

## 2021-09-26 DIAGNOSIS — R1013 Epigastric pain: Secondary | ICD-10-CM

## 2021-09-26 MED ORDER — PANTOPRAZOLE SODIUM 40 MG PO TBEC: 40.0000 mg | DELAYED_RELEASE_TABLET | Freq: Every day | ORAL | 2 refills | Status: DC

## 2021-09-26 NOTE — Patient Instructions (Signed)
It was nice to meet you!  We will get you Scheduled for EGD for further evaluation of your upper abdominal pain I have sent protonix 40mg  to your pharmacy Please take this 30 minutes prior to breakfast Avoid greasy, spicy, fried, citrus foods, and be mindful that caffeine, carbonated drinks, chocolate and alcohol can increase reflux symptoms. Stay upright 2-3 hours after eating, prior to lying down and avoid eating late in the evenings. I have also provided the Low FODMAP eating guide as I suspect some of your symptoms are related to IBS, as discussed I would encourage you to keep a food journal with symptoms and foods you have eaten x2-3 weeks to help correlate symptoms with certain triggers You can stop your pepcid at this time If you decide to try the hyosciamine that Dr. Laural Golden gave you, you can try taking it prior to meals and at bedtime, as discussed some people find that they only needed it on an as needed basis when their bowels are more irritated, that is fine too  Follow up 4 months
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
## 2021-09-27 ENCOUNTER — Encounter (INDEPENDENT_AMBULATORY_CARE_PROVIDER_SITE_OTHER): Payer: Self-pay

## 2021-09-28 DIAGNOSIS — K529 Noninfective gastroenteritis and colitis, unspecified: Secondary | ICD-10-CM | POA: Insufficient documentation

## 2021-11-06 ENCOUNTER — Encounter (HOSPITAL_COMMUNITY)
Admission: RE | Admit: 2021-11-06 | Discharge: 2021-11-06 | Disposition: A | Discharge status: Home / Self Care | Payer: 59 | Source: Ambulatory Visit | Attending: Gastroenterology | Admitting: Gastroenterology

## 2021-11-08 ENCOUNTER — Ambulatory Visit (HOSPITAL_COMMUNITY): Payer: 59 | Admitting: Anesthesiology

## 2021-11-08 ENCOUNTER — Ambulatory Visit (HOSPITAL_BASED_OUTPATIENT_CLINIC_OR_DEPARTMENT_OTHER): Payer: 59 | Admitting: Anesthesiology

## 2021-11-08 ENCOUNTER — Encounter (HOSPITAL_COMMUNITY)
Admission: RE | Disposition: A | Discharge status: Home / Self Care | Payer: Self-pay | Source: Home / Self Care | Attending: Gastroenterology

## 2021-11-08 ENCOUNTER — Ambulatory Visit (HOSPITAL_COMMUNITY)
Admission: RE | Admit: 2021-11-08 | Discharge: 2021-11-08 | Disposition: A | Discharge status: Home / Self Care | Payer: 59 | Attending: Gastroenterology | Admitting: Gastroenterology

## 2021-11-08 ENCOUNTER — Other Ambulatory Visit: Payer: Self-pay

## 2021-11-08 DIAGNOSIS — R1010 Upper abdominal pain, unspecified: Secondary | ICD-10-CM

## 2021-11-08 DIAGNOSIS — F32A Depression, unspecified: Secondary | ICD-10-CM | POA: Diagnosis not present

## 2021-11-08 DIAGNOSIS — R1011 Right upper quadrant pain: Secondary | ICD-10-CM | POA: Insufficient documentation

## 2021-11-08 DIAGNOSIS — R1013 Epigastric pain: Secondary | ICD-10-CM | POA: Diagnosis not present

## 2021-11-08 DIAGNOSIS — R110 Nausea: Secondary | ICD-10-CM

## 2021-11-08 DIAGNOSIS — K2980 Duodenitis without bleeding: Secondary | ICD-10-CM

## 2021-11-08 DIAGNOSIS — K449 Diaphragmatic hernia without obstruction or gangrene: Secondary | ICD-10-CM | POA: Diagnosis not present

## 2021-11-08 DIAGNOSIS — Z79899 Other long term (current) drug therapy: Secondary | ICD-10-CM | POA: Diagnosis not present

## 2021-11-08 DIAGNOSIS — F419 Anxiety disorder, unspecified: Secondary | ICD-10-CM | POA: Diagnosis not present

## 2021-11-08 DIAGNOSIS — R6881 Early satiety: Secondary | ICD-10-CM

## 2021-11-08 DIAGNOSIS — K219 Gastro-esophageal reflux disease without esophagitis: Secondary | ICD-10-CM | POA: Insufficient documentation

## 2021-11-08 DIAGNOSIS — F17210 Nicotine dependence, cigarettes, uncomplicated: Secondary | ICD-10-CM | POA: Insufficient documentation

## 2021-11-08 DIAGNOSIS — K31A0 Gastric intestinal metaplasia, unspecified: Secondary | ICD-10-CM | POA: Insufficient documentation

## 2021-11-08 DIAGNOSIS — R1319 Other dysphagia: Secondary | ICD-10-CM

## 2021-11-08 DIAGNOSIS — R911 Solitary pulmonary nodule: Secondary | ICD-10-CM

## 2021-11-08 DIAGNOSIS — Z86010 Personal history of colonic polyps: Secondary | ICD-10-CM

## 2021-11-08 DIAGNOSIS — R059 Cough, unspecified: Secondary | ICD-10-CM

## 2021-11-08 DIAGNOSIS — K529 Noninfective gastroenteritis and colitis, unspecified: Secondary | ICD-10-CM

## 2021-11-08 HISTORY — PX: BIOPSY: SHX5522

## 2021-11-08 HISTORY — PX: ESOPHAGOGASTRODUODENOSCOPY (EGD) WITH PROPOFOL: SHX5813

## 2021-11-08 SURGERY — ESOPHAGOGASTRODUODENOSCOPY (EGD) WITH PROPOFOL
Anesthesia: General

## 2021-11-08 MED ORDER — LIDOCAINE HCL (CARDIAC) PF 100 MG/5ML IV SOSY
PREFILLED_SYRINGE | INTRAVENOUS | Status: DC | PRN
  Administered 2021-11-08: 50 mg via INTRAVENOUS

## 2021-11-08 MED ORDER — LACTATED RINGERS IV SOLN: INTRAVENOUS | Status: DC

## 2021-11-08 MED ORDER — PROPOFOL 10 MG/ML IV BOLUS
INTRAVENOUS | Status: DC | PRN
  Administered 2021-11-08: 30 mg via INTRAVENOUS
  Administered 2021-11-08: 20 mg via INTRAVENOUS
  Administered 2021-11-08: 70 mg via INTRAVENOUS
  Administered 2021-11-08: 30 mg via INTRAVENOUS
  Administered 2021-11-08: 20 mg via INTRAVENOUS
  Administered 2021-11-08: 30 mg via INTRAVENOUS

## 2021-11-08 NOTE — Op Note (Signed)
[facility] Patient Name: Yvette Curry Procedure Date: 11/08/2021 9:16 AM MRN: [id number] Date of Birth: 04/10/1956 Attending MD: Maylon Peppers ,  CSN: [id number] Age: 66 Admit Type: Outpatient Procedure:                Upper GI endoscopy Indications:              Upper abdominal pain Providers:                Maylon Peppers, Janeece Riggers, RN, Everardo Pacific, Aram Candela Referring MD:              Medicines:                Monitored Anesthesia Care Complications:            No immediate complications. Estimated Blood Loss:     Estimated blood loss: none. Procedure:                Pre-Anesthesia Assessment:                           - Prior to the procedure, a History and Physical                            was performed, and patient medications, allergies                            and sensitivities were reviewed. The patient's                            tolerance of previous anesthesia was reviewed.                           - The risks and benefits of the procedure and the                            sedation options and risks were discussed with the                            patient. All questions were answered and informed                            consent was obtained.                           - ASA Grade Assessment: II - A patient with mild                            systemic disease.                           After obtaining informed consent, the endoscope was                            passed under direct vision. Throughout the  procedure, the patient's blood pressure, pulse, and                            oxygen saturations were monitored continuously. The                            GIF-H190 ([id number]) scope was introduced through the                            mouth, and advanced to the second part of duodenum.                            The upper GI endoscopy was accomplished without                             difficulty. The patient tolerated the procedure                            well. Scope In: 9:28:20 AM Scope Out: 9:37:24 AM Total Procedure Duration: 0 hours 9 minutes 4 seconds  Findings:      A 1 cm hiatal hernia was present.      The gastroesophageal flap valve was visualized endoscopically and       classified as Hill Grade IV (no fold, wide open lumen, hiatal hernia       present).      The entire examined stomach was normal. Biopsies were taken with a cold       forceps for Helicobacter pylori testing.      Localized mild inflammation characterized by congestion (edema) and       erythema was found in the first portion of the duodenum. Biopsies from       the second portion were taken with a cold forceps for histology. Impression:               - 1 cm hiatal hernia.                           - Gastroesophageal flap valve classified as Hill                            Grade IV (no fold, wide open lumen, hiatal hernia                            present).                           - Normal stomach. Biopsied.                           - Duodenitis. Biopsied. Moderate Sedation:      Per Anesthesia Care Recommendation:           - Discharge patient to home (ambulatory).                           - Resume previous diet.                           -  Await pathology results.                           - Continue present medications. Procedure Code(s):        --- Professional ---                           [phone], Esophagogastroduodenoscopy, flexible,                            transoral; with biopsy, single or multiple Diagnosis Code(s):        --- Professional ---                           K44.9, Diaphragmatic hernia without obstruction or                            gangrene                           K29.80, Duodenitis without bleeding                           R10.10, Upper abdominal pain, unspecified CPT copyright 2019 American Medical Association. All rights reserved. The codes  documented in this report are preliminary and upon coder review may  be revised to meet current compliance requirements. Maylon Peppers, MD Maylon Peppers,  11/08/2021 9:42:29 AM This report has been signed electronically. Number of Addenda: 0
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
## 2021-11-08 NOTE — Transfer of Care (Signed)
Immediate Anesthesia Transfer of Care Note  Patient: Yvette Curry  Procedure(s) Performed: ESOPHAGOGASTRODUODENOSCOPY (EGD) WITH PROPOFOL BIOPSY  Patient Location: Short Stay  Anesthesia Type:General  Level of Consciousness: awake and drowsy  Airway & Oxygen Therapy: Patient Spontanous Breathing  Post-op Assessment: Report given to RN and Post -op Vital signs reviewed and stable  Post vital signs: Reviewed and stable  Last Vitals:  Vitals Value Taken Time  BP 84/46 11/08/21 0940  Temp 36.7 C 11/08/21 0940  Pulse 82 11/08/21 0940  Resp 20 11/08/21 0940  SpO2 94 % 11/08/21 0940    Last Pain:  Vitals:   11/08/21 0940  TempSrc: Oral  PainSc: Asleep         Complications: No notable events documented.

## 2021-11-08 NOTE — Discharge Instructions (Signed)
You are being discharged to home.  ?Resume your previous diet.  ?We are waiting for your pathology results.  ?Continue your present medications.  ?

## 2021-11-08 NOTE — Anesthesia Preprocedure Evaluation (Signed)
Anesthesia Evaluation  Patient identified by MRN, date of birth, ID band Patient awake    Reviewed: Allergy & Precautions, NPO status , Patient's Chart, lab work & pertinent test results  History of Anesthesia Complications (+) Family history of anesthesia reaction  Airway Mallampati: II  TM Distance: >3 FB Neck ROM: Full    Dental  (+) Dental Advisory Given, Teeth Intact   Pulmonary shortness of breath, Current Smoker,    Pulmonary exam normal breath sounds clear to auscultation       Cardiovascular negative cardio ROS Normal cardiovascular exam Rhythm:Regular Rate:Normal     Neuro/Psych PSYCHIATRIC DISORDERS Anxiety Depression negative neurological ROS     GI/Hepatic Neg liver ROS, GERD  Medicated,  Endo/Other  negative endocrine ROS  Renal/GU negative Renal ROS  negative genitourinary   Musculoskeletal  (+) Arthritis , Osteoarthritis,    Abdominal   Peds negative pediatric ROS (+)  Hematology negative hematology ROS (+)   Anesthesia Other Findings   Reproductive/Obstetrics negative OB ROS                            Anesthesia Physical Anesthesia Plan  ASA: 2  Anesthesia Plan: General   Post-op Pain Management: Minimal or no pain anticipated   Induction: Intravenous  PONV Risk Score and Plan: Propofol infusion  Airway Management Planned: Nasal Cannula and Natural Airway  Additional Equipment:   Intra-op Plan:   Post-operative Plan:   Informed Consent: I have reviewed the patients History and Physical, chart, labs and discussed the procedure including the risks, benefits and alternatives for the proposed anesthesia with the patient or authorized representative who has indicated his/her understanding and acceptance.     Dental advisory given  Plan Discussed with: CRNA and Surgeon  Anesthesia Plan Comments:         Anesthesia Quick Evaluation

## 2021-11-08 NOTE — Anesthesia Postprocedure Evaluation (Signed)
Anesthesia Post Note  Patient: Yvette Curry  Procedure(s) Performed: ESOPHAGOGASTRODUODENOSCOPY (EGD) WITH PROPOFOL BIOPSY  Patient location during evaluation: Phase II Anesthesia Type: General Level of consciousness: awake and alert and oriented Pain management: pain level controlled Vital Signs Assessment: post-procedure vital signs reviewed and stable Respiratory status: spontaneous breathing, nonlabored ventilation and respiratory function stable Cardiovascular status: blood pressure returned to baseline and stable Postop Assessment: no apparent nausea or vomiting Anesthetic complications: no   No notable events documented.   Last Vitals:  Vitals:   11/08/21 0940 11/08/21 0945  BP: (!) 84/46 94/65  Pulse: 82   Resp: 20   Temp: 36.7 C   SpO2: 94%     Last Pain:  Vitals:   11/08/21 0940  TempSrc: Oral  PainSc: Asleep                 Jasyah Theurer C Mardy Lucier

## 2021-11-11 ENCOUNTER — Other Ambulatory Visit: Payer: Self-pay | Admitting: Endocrinology

## 2021-11-11 ENCOUNTER — Other Ambulatory Visit (HOSPITAL_COMMUNITY): Payer: Self-pay | Admitting: Endocrinology

## 2021-11-11 DIAGNOSIS — R221 Localized swelling, mass and lump, neck: Secondary | ICD-10-CM

## 2021-11-11 LAB — H. PYLORI ANTIBODY, IGG: H Pylori IgG: 0.12 Index Value (ref 0.00–0.79)

## 2021-11-12 ENCOUNTER — Ambulatory Visit (HOSPITAL_COMMUNITY)
Admission: RE | Admit: 2021-11-12 | Discharge: 2021-11-12 | Disposition: A | Discharge status: Home / Self Care | Payer: 59 | Source: Ambulatory Visit | Attending: Endocrinology | Admitting: Endocrinology

## 2021-11-12 ENCOUNTER — Encounter (HOSPITAL_COMMUNITY): Payer: Self-pay

## 2021-11-12 DIAGNOSIS — R221 Localized swelling, mass and lump, neck: Secondary | ICD-10-CM | POA: Diagnosis present

## 2021-11-12 LAB — SURGICAL PATHOLOGY

## 2021-11-12 MED ORDER — IOHEXOL 300 MG/ML  SOLN
100.0000 mL | Freq: Once | INTRAMUSCULAR | Status: AC | PRN
  Administered 2021-11-12: 75 mL via INTRAVENOUS

## 2021-11-13 ENCOUNTER — Other Ambulatory Visit: Payer: Self-pay | Admitting: Endocrinology

## 2021-11-13 ENCOUNTER — Other Ambulatory Visit (INDEPENDENT_AMBULATORY_CARE_PROVIDER_SITE_OTHER): Payer: Self-pay

## 2021-11-13 ENCOUNTER — Telehealth: Payer: Self-pay | Admitting: Internal Medicine

## 2021-11-13 DIAGNOSIS — R1011 Right upper quadrant pain: Secondary | ICD-10-CM

## 2021-11-13 DIAGNOSIS — R918 Other nonspecific abnormal finding of lung field: Secondary | ICD-10-CM

## 2021-11-13 NOTE — Telephone Encounter (Signed)
Scheduled appt per 7/26 referral. Pt is aware of appt date and time. Pt is aware to arrive 15 mins prior to appt time and to bring and updated insurance card. Pt is aware of appt location.

## 2021-11-14 ENCOUNTER — Encounter (HOSPITAL_COMMUNITY): Payer: Self-pay | Admitting: Gastroenterology

## 2021-11-18 ENCOUNTER — Ambulatory Visit
Admission: RE | Admit: 2021-11-18 | Discharge: 2021-11-18 | Disposition: A | Discharge status: Home / Self Care | Payer: 59 | Source: Ambulatory Visit | Attending: Endocrinology | Admitting: Endocrinology

## 2021-11-18 DIAGNOSIS — R918 Other nonspecific abnormal finding of lung field: Secondary | ICD-10-CM

## 2021-11-18 MED ORDER — IOPAMIDOL (ISOVUE-300) INJECTION 61%
75.0000 mL | Freq: Once | INTRAVENOUS | Status: AC | PRN
  Administered 2021-11-18: 75 mL via INTRAVENOUS

## 2021-11-19 DIAGNOSIS — C801 Malignant (primary) neoplasm, unspecified: Secondary | ICD-10-CM

## 2021-11-19 HISTORY — DX: Malignant (primary) neoplasm, unspecified: C80.1

## 2021-11-21 ENCOUNTER — Other Ambulatory Visit (INDEPENDENT_AMBULATORY_CARE_PROVIDER_SITE_OTHER): Payer: Self-pay

## 2021-11-21 ENCOUNTER — Ambulatory Visit (HOSPITAL_COMMUNITY)
Admission: RE | Admit: 2021-11-21 | Discharge: 2021-11-21 | Disposition: A | Discharge status: Home / Self Care | Payer: 59 | Source: Ambulatory Visit | Attending: Gastroenterology | Admitting: Gastroenterology

## 2021-11-21 DIAGNOSIS — R110 Nausea: Secondary | ICD-10-CM

## 2021-11-21 DIAGNOSIS — R1011 Right upper quadrant pain: Secondary | ICD-10-CM | POA: Insufficient documentation

## 2021-11-21 DIAGNOSIS — K760 Fatty (change of) liver, not elsewhere classified: Secondary | ICD-10-CM

## 2021-11-21 DIAGNOSIS — R1013 Epigastric pain: Secondary | ICD-10-CM

## 2021-11-22 ENCOUNTER — Other Ambulatory Visit: Payer: Self-pay

## 2021-11-22 DIAGNOSIS — R911 Solitary pulmonary nodule: Secondary | ICD-10-CM

## 2021-11-22 LAB — COMPREHENSIVE METABOLIC PANEL
AG Ratio: 1.6 (calc) (ref 1.0–2.5)
ALT: 16 U/L (ref 6–29)
AST: 15 U/L (ref 10–35)
Albumin: 4.3 g/dL (ref 3.6–5.1)
Alkaline phosphatase (APISO): 83 U/L (ref 37–153)
BUN: 15 mg/dL (ref 7–25)
CO2: 27 mmol/L (ref 20–32)
Calcium: 9.6 mg/dL (ref 8.6–10.4)
Chloride: 105 mmol/L (ref 98–110)
Creat: 0.84 mg/dL (ref 0.50–1.05)
Globulin: 2.7 g/dL (calc) (ref 1.9–3.7)
Glucose, Bld: 89 mg/dL (ref 65–99)
Potassium: 4.2 mmol/L (ref 3.5–5.3)
Sodium: 141 mmol/L (ref 135–146)
Total Bilirubin: 0.3 mg/dL (ref 0.2–1.2)
Total Protein: 7 g/dL (ref 6.1–8.1)

## 2021-11-26 ENCOUNTER — Other Ambulatory Visit: Payer: Self-pay | Admitting: Medical Oncology

## 2021-11-26 ENCOUNTER — Inpatient Hospital Stay: Payer: 59 | Attending: Internal Medicine | Admitting: Internal Medicine

## 2021-11-26 ENCOUNTER — Inpatient Hospital Stay: Payer: 59

## 2021-11-26 ENCOUNTER — Other Ambulatory Visit: Payer: Self-pay

## 2021-11-26 ENCOUNTER — Encounter: Payer: Self-pay | Admitting: Internal Medicine

## 2021-11-26 VITALS — BP 140/84 | HR 88 | Temp 97.7°F | Resp 16 | Ht 61.0 in | Wt 159.2 lb

## 2021-11-26 DIAGNOSIS — R590 Localized enlarged lymph nodes: Secondary | ICD-10-CM | POA: Diagnosis present

## 2021-11-26 DIAGNOSIS — R911 Solitary pulmonary nodule: Secondary | ICD-10-CM

## 2021-11-26 DIAGNOSIS — Z79899 Other long term (current) drug therapy: Secondary | ICD-10-CM | POA: Diagnosis not present

## 2021-11-26 DIAGNOSIS — R918 Other nonspecific abnormal finding of lung field: Secondary | ICD-10-CM | POA: Insufficient documentation

## 2021-11-26 DIAGNOSIS — R059 Cough, unspecified: Secondary | ICD-10-CM | POA: Diagnosis not present

## 2021-11-26 DIAGNOSIS — R300 Dysuria: Secondary | ICD-10-CM

## 2021-11-26 DIAGNOSIS — F419 Anxiety disorder, unspecified: Secondary | ICD-10-CM | POA: Diagnosis not present

## 2021-11-26 DIAGNOSIS — R519 Headache, unspecified: Secondary | ICD-10-CM | POA: Insufficient documentation

## 2021-11-26 DIAGNOSIS — M1990 Unspecified osteoarthritis, unspecified site: Secondary | ICD-10-CM | POA: Diagnosis not present

## 2021-11-26 DIAGNOSIS — R5383 Other fatigue: Secondary | ICD-10-CM | POA: Insufficient documentation

## 2021-11-26 DIAGNOSIS — F17210 Nicotine dependence, cigarettes, uncomplicated: Secondary | ICD-10-CM | POA: Insufficient documentation

## 2021-11-26 DIAGNOSIS — F32A Depression, unspecified: Secondary | ICD-10-CM | POA: Insufficient documentation

## 2021-11-26 DIAGNOSIS — K219 Gastro-esophageal reflux disease without esophagitis: Secondary | ICD-10-CM | POA: Insufficient documentation

## 2021-11-26 DIAGNOSIS — C3490 Malignant neoplasm of unspecified part of unspecified bronchus or lung: Secondary | ICD-10-CM

## 2021-11-26 DIAGNOSIS — E785 Hyperlipidemia, unspecified: Secondary | ICD-10-CM | POA: Diagnosis not present

## 2021-11-26 LAB — CBC WITH DIFFERENTIAL (CANCER CENTER ONLY)
Abs Immature Granulocytes: 0.03 10*3/uL (ref 0.00–0.07)
Basophils Absolute: 0.1 10*3/uL (ref 0.0–0.1)
Basophils Relative: 1 %
Eosinophils Absolute: 0.2 10*3/uL (ref 0.0–0.5)
Eosinophils Relative: 2 %
HCT: 39 % (ref 36.0–46.0)
Hemoglobin: 13.3 g/dL (ref 12.0–15.0)
Immature Granulocytes: 0 %
Lymphocytes Relative: 25 %
Lymphs Abs: 2.1 10*3/uL (ref 0.7–4.0)
MCH: 30.6 pg (ref 26.0–34.0)
MCHC: 34.1 g/dL (ref 30.0–36.0)
MCV: 89.7 fL (ref 80.0–100.0)
Monocytes Absolute: 0.6 10*3/uL (ref 0.1–1.0)
Monocytes Relative: 7 %
Neutro Abs: 5.5 10*3/uL (ref 1.7–7.7)
Neutrophils Relative %: 65 %
Platelet Count: 272 10*3/uL (ref 150–400)
RBC: 4.35 MIL/uL (ref 3.87–5.11)
RDW: 14.6 % (ref 11.5–15.5)
WBC Count: 8.5 10*3/uL (ref 4.0–10.5)
nRBC: 0 % (ref 0.0–0.2)

## 2021-11-26 LAB — CMP (CANCER CENTER ONLY)
ALT: 18 U/L (ref 0–44)
AST: 17 U/L (ref 15–41)
Albumin: 4.2 g/dL (ref 3.5–5.0)
Alkaline Phosphatase: 72 U/L (ref 38–126)
Anion gap: 4 — ABNORMAL LOW (ref 5–15)
BUN: 12 mg/dL (ref 8–23)
CO2: 30 mmol/L (ref 22–32)
Calcium: 9.9 mg/dL (ref 8.9–10.3)
Chloride: 106 mmol/L (ref 98–111)
Creatinine: 0.81 mg/dL (ref 0.44–1.00)
GFR, Estimated: >60 mL/min (ref >60)
Glucose, Bld: 110 mg/dL — ABNORMAL HIGH (ref 70–99)
Potassium: 4.4 mmol/L (ref 3.5–5.1)
Sodium: 140 mmol/L (ref 135–145)
Total Bilirubin: 0.4 mg/dL (ref 0.3–1.2)
Total Protein: 7.5 g/dL (ref 6.5–8.1)

## 2021-11-26 LAB — URINALYSIS, COMPLETE (UACMP) WITH MICROSCOPIC
Bilirubin Urine: NEGATIVE
Glucose, UA: NEGATIVE mg/dL
Ketones, ur: NEGATIVE mg/dL
Nitrite: NEGATIVE
Protein, ur: NEGATIVE mg/dL
Specific Gravity, Urine: 1.009 (ref 1.005–1.030)
pH: 6 (ref 5.0–8.0)

## 2021-11-26 MED ORDER — NITROFURANTOIN MACROCRYSTAL 50 MG PO CAPS: 50.0000 mg | ORAL_CAPSULE | Freq: Two times a day (BID) | ORAL | 0 refills | Status: DC

## 2021-11-26 MED ORDER — NICOTINE 21 MG/24HR TD PT24: 21.0000 mg | MEDICATED_PATCH | Freq: Every day | TRANSDERMAL | 0 refills | Status: DC

## 2021-11-26 NOTE — Progress Notes (Unsigned)
Yvette Curry, Yvette Libra, MD  Donita Brooks D Approved for US guided left supraclavicular lymph node biopsy.   Yvette Curry

## 2021-11-26 NOTE — Patient Instructions (Signed)
Steps to Quit Smoking Smoking tobacco is the leading cause of preventable death. It can affect almost every organ in the body. Smoking puts you and people around you at risk for many serious, long-lasting (chronic) diseases. Quitting smoking can be hard, but it is one of the best things that you can do for your health. It is never too late to quit. Do not give up if you cannot quit the first time. Some people need to try many times to quit. Do your best to stick to your quit plan, and talk with your doctor if you have any questions or concerns. How do I get ready to quit? Pick a date to quit. Set a date within the next 2 weeks to give you time to prepare. Write down the reasons why you are quitting. Keep this list in places where you will see it often. Tell your family, friends, and co-workers that you are quitting. Their support is important. Talk with your doctor about the choices that may help you quit. Find out if your health insurance will pay for these treatments. Know the people, places, things, and activities that make you want to smoke (triggers). Avoid them. What first steps can I take to quit smoking? Throw away all cigarettes at home, at work, and in your car. Throw away the things that you use when you smoke, such as ashtrays and lighters. Clean your car. Empty the ashtray. Clean your home, including curtains and carpets. What can I do to help me quit smoking? Talk with your doctor about taking medicines and seeing a counselor. You are more likely to succeed when you do both. If you are pregnant or breastfeeding: Talk with your doctor about counseling or other ways to quit smoking. Do not take medicine to help you quit smoking unless your doctor tells you to. Quit right away Quit smoking completely, instead of slowly cutting back on how much you smoke over a period of time. Stopping smoking right away may be more successful than slowly quitting. Go to counseling. In-person is best  if this is an option. You are more likely to quit if you go to counseling sessions regularly. Take medicine You may take medicines to help you quit. Some medicines need a prescription, and some you can buy over-the-counter. Some medicines may contain a drug called nicotine to replace the nicotine in cigarettes. Medicines may: Help you stop having the desire to smoke (cravings). Help to stop the problems that come when you stop smoking (withdrawal symptoms). Your doctor may ask you to use: Nicotine patches, gum, or lozenges. Nicotine inhalers or sprays. Non-nicotine medicine that you take by mouth. Find resources Find resources and other ways to help you quit smoking and remain smoke-free after you quit. They include: Online chats with a counselor. Phone quitlines. Printed self-help materials. Support groups or group counseling. Text messaging programs. Mobile phone apps. Use apps on your mobile phone or tablet that can help you stick to your quit plan. Examples of free services include Quit Guide from the CDC and smokefree.gov  What can I do to make it easier to quit?  Talk to your family and friends. Ask them to support and encourage you. Call a phone quitline, such as 1-800-QUIT-NOW, reach out to support groups, or work with a counselor. Ask people who smoke to not smoke around you. Avoid places that make you want to smoke, such as: Bars. Parties. Smoke-break areas at work. Spend time with people who do not smoke. Lower   the stress in your life. Stress can make you want to smoke. Try these things to lower stress: Getting regular exercise. Doing deep-breathing exercises. Doing yoga. Meditating. What benefits will I see if I quit smoking? Over time, you may have: A better sense of smell and taste. Less coughing and sore throat. A slower heart rate. Lower blood pressure. Clearer skin. Better breathing. Fewer sick days. Summary Quitting smoking can be hard, but it is one of  the best things that you can do for your health. Do not give up if you cannot quit the first time. Some people need to try many times to quit. When you decide to quit smoking, make a plan to help you succeed. Quit smoking right away, not slowly over a period of time. When you start quitting, get help and support to keep you smoke-free. This information is not intended to replace advice given to you by your health care provider. Make sure you discuss any questions you have with your health care provider. Document Revised: 03/29/2021 Document Reviewed: 03/29/2021 [employer] Patient Education  2023 [employer]  
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
## 2021-11-28 ENCOUNTER — Telehealth: Payer: Self-pay | Admitting: Medical Oncology

## 2021-11-28 ENCOUNTER — Other Ambulatory Visit: Payer: Self-pay | Admitting: Internal Medicine

## 2021-11-28 ENCOUNTER — Telehealth: Payer: Self-pay | Admitting: Internal Medicine

## 2021-11-28 ENCOUNTER — Ambulatory Visit (HOSPITAL_COMMUNITY)
Admission: RE | Admit: 2021-11-28 | Discharge: 2021-11-28 | Disposition: A | Discharge status: Home / Self Care | Payer: 59 | Source: Ambulatory Visit | Attending: Internal Medicine | Admitting: Internal Medicine

## 2021-11-28 DIAGNOSIS — C3490 Malignant neoplasm of unspecified part of unspecified bronchus or lung: Secondary | ICD-10-CM | POA: Diagnosis present

## 2021-11-28 MED ORDER — LORAZEPAM 0.5 MG PO TABS: ORAL_TABLET | ORAL | 0 refills | Status: DC

## 2021-11-28 NOTE — Telephone Encounter (Signed)
Scheduled per 08/08 los, patient has been called and notified.

## 2021-11-28 NOTE — Telephone Encounter (Signed)
Asking if abd xray resulted re her colon clip.   Re MRI - It is scheduled for Monday 0900 pending abd xray results.  If she can proceed with MRI then she will need something for claustrophobia.

## 2021-11-29 NOTE — Telephone Encounter (Signed)
Abdonial xray indicates "No visualized surgical or endoscopic clip. No other implanted medical device in the abdomen."  Please has been advised as indicated.

## 2021-12-02 ENCOUNTER — Encounter (HOSPITAL_COMMUNITY)
Admission: RE | Admit: 2021-12-02 | Discharge: 2021-12-02 | Disposition: A | Discharge status: Home / Self Care | Payer: 59 | Source: Ambulatory Visit | Attending: Internal Medicine | Admitting: Internal Medicine

## 2021-12-02 ENCOUNTER — Ambulatory Visit (HOSPITAL_COMMUNITY)
Admission: RE | Admit: 2021-12-02 | Discharge: 2021-12-02 | Disposition: A | Discharge status: Home / Self Care | Payer: 59 | Source: Ambulatory Visit | Attending: Internal Medicine | Admitting: Internal Medicine

## 2021-12-02 DIAGNOSIS — C3490 Malignant neoplasm of unspecified part of unspecified bronchus or lung: Secondary | ICD-10-CM | POA: Insufficient documentation

## 2021-12-02 LAB — GLUCOSE, CAPILLARY: Glucose-Capillary: 112 mg/dL — ABNORMAL HIGH (ref 70–99)

## 2021-12-02 MED ORDER — FLUDEOXYGLUCOSE F - 18 (FDG) INJECTION
7.9000 | Freq: Once | INTRAVENOUS | Status: AC
  Administered 2021-12-02: 7.9 via INTRAVENOUS

## 2021-12-02 MED ORDER — GADOBUTROL 1 MMOL/ML IV SOLN
7.0000 mL | Freq: Once | INTRAVENOUS | Status: AC | PRN
  Administered 2021-12-02: 7 mL via INTRAVENOUS

## 2021-12-03 ENCOUNTER — Telehealth: Payer: Self-pay | Admitting: Medical Oncology

## 2021-12-03 NOTE — Telephone Encounter (Signed)
Pressure over bladder area, urinating some ,denies dysuria. Completed 3 day Macrodantin. Per Julien Nordmann , I instructed pt to f/u with PCP.

## 2021-12-08 ENCOUNTER — Other Ambulatory Visit: Payer: Self-pay | Admitting: Radiology

## 2021-12-08 DIAGNOSIS — R599 Enlarged lymph nodes, unspecified: Secondary | ICD-10-CM

## 2021-12-09 ENCOUNTER — Other Ambulatory Visit: Payer: Self-pay | Admitting: Radiology

## 2021-12-10 ENCOUNTER — Other Ambulatory Visit: Payer: Self-pay

## 2021-12-10 ENCOUNTER — Ambulatory Visit (HOSPITAL_COMMUNITY)
Admission: RE | Admit: 2021-12-10 | Discharge: 2021-12-10 | Disposition: A | Discharge status: Home / Self Care | Payer: 59 | Source: Ambulatory Visit | Attending: Internal Medicine | Admitting: Internal Medicine

## 2021-12-10 ENCOUNTER — Encounter (HOSPITAL_COMMUNITY): Payer: Self-pay

## 2021-12-10 DIAGNOSIS — I719 Aortic aneurysm of unspecified site, without rupture: Secondary | ICD-10-CM | POA: Diagnosis not present

## 2021-12-10 DIAGNOSIS — C770 Secondary and unspecified malignant neoplasm of lymph nodes of head, face and neck: Secondary | ICD-10-CM | POA: Diagnosis not present

## 2021-12-10 DIAGNOSIS — J438 Other emphysema: Secondary | ICD-10-CM | POA: Diagnosis not present

## 2021-12-10 DIAGNOSIS — I700 Atherosclerosis of aorta: Secondary | ICD-10-CM | POA: Diagnosis not present

## 2021-12-10 DIAGNOSIS — I2510 Atherosclerotic heart disease of native coronary artery without angina pectoris: Secondary | ICD-10-CM | POA: Insufficient documentation

## 2021-12-10 DIAGNOSIS — R599 Enlarged lymph nodes, unspecified: Secondary | ICD-10-CM

## 2021-12-10 DIAGNOSIS — C3490 Malignant neoplasm of unspecified part of unspecified bronchus or lung: Secondary | ICD-10-CM | POA: Diagnosis present

## 2021-12-10 DIAGNOSIS — K219 Gastro-esophageal reflux disease without esophagitis: Secondary | ICD-10-CM | POA: Diagnosis not present

## 2021-12-10 DIAGNOSIS — F17200 Nicotine dependence, unspecified, uncomplicated: Secondary | ICD-10-CM | POA: Diagnosis not present

## 2021-12-10 DIAGNOSIS — K5790 Diverticulosis of intestine, part unspecified, without perforation or abscess without bleeding: Secondary | ICD-10-CM | POA: Diagnosis not present

## 2021-12-10 DIAGNOSIS — F419 Anxiety disorder, unspecified: Secondary | ICD-10-CM | POA: Insufficient documentation

## 2021-12-10 DIAGNOSIS — R918 Other nonspecific abnormal finding of lung field: Secondary | ICD-10-CM | POA: Diagnosis not present

## 2021-12-10 DIAGNOSIS — F32A Depression, unspecified: Secondary | ICD-10-CM | POA: Diagnosis not present

## 2021-12-10 DIAGNOSIS — E7800 Pure hypercholesterolemia, unspecified: Secondary | ICD-10-CM | POA: Insufficient documentation

## 2021-12-10 LAB — BASIC METABOLIC PANEL
Anion gap: 7 (ref 5–15)
BUN: 18 mg/dL (ref 8–23)
CO2: 27 mmol/L (ref 22–32)
Calcium: 10.1 mg/dL (ref 8.9–10.3)
Chloride: 107 mmol/L (ref 98–111)
Creatinine, Ser: 0.7 mg/dL (ref 0.44–1.00)
GFR, Estimated: >60 mL/min (ref >60)
Glucose, Bld: 110 mg/dL — ABNORMAL HIGH (ref 70–99)
Potassium: 4.1 mmol/L (ref 3.5–5.1)
Sodium: 141 mmol/L (ref 135–145)

## 2021-12-10 LAB — CBC WITH DIFFERENTIAL/PLATELET
Abs Immature Granulocytes: 0.04 10*3/uL (ref 0.00–0.07)
Basophils Absolute: 0 10*3/uL (ref 0.0–0.1)
Basophils Relative: 0 %
Eosinophils Absolute: 0.2 10*3/uL (ref 0.0–0.5)
Eosinophils Relative: 2 %
HCT: 44.1 % (ref 36.0–46.0)
Hemoglobin: 14.5 g/dL (ref 12.0–15.0)
Immature Granulocytes: 0 %
Lymphocytes Relative: 23 %
Lymphs Abs: 2.2 10*3/uL (ref 0.7–4.0)
MCH: 30.1 pg (ref 26.0–34.0)
MCHC: 32.9 g/dL (ref 30.0–36.0)
MCV: 91.7 fL (ref 80.0–100.0)
Monocytes Absolute: 0.7 10*3/uL (ref 0.1–1.0)
Monocytes Relative: 8 %
Neutro Abs: 6.6 10*3/uL (ref 1.7–7.7)
Neutrophils Relative %: 67 %
Platelets: 293 10*3/uL (ref 150–400)
RBC: 4.81 MIL/uL (ref 3.87–5.11)
RDW: 14.8 % (ref 11.5–15.5)
WBC: 9.8 10*3/uL (ref 4.0–10.5)
nRBC: 0 % (ref 0.0–0.2)

## 2021-12-10 LAB — PROTIME-INR
INR: 0.9 (ref 0.8–1.2)
Prothrombin Time: 12.5 seconds (ref 11.4–15.2)

## 2021-12-10 MED ORDER — TETRACAINE HCL 1 % IJ SOLN
INTRAMUSCULAR | Status: AC | PRN
  Administered 2021-12-10: 20 mg

## 2021-12-10 MED ORDER — FENTANYL CITRATE (PF) 100 MCG/2ML IJ SOLN
INTRAMUSCULAR | Status: AC | PRN
  Administered 2021-12-10 (×2): 50 ug via INTRAVENOUS

## 2021-12-10 MED ORDER — MIDAZOLAM HCL 2 MG/2ML IJ SOLN
INTRAMUSCULAR | Status: AC | PRN
  Administered 2021-12-10 (×2): 1 mg via INTRAVENOUS

## 2021-12-10 MED ORDER — SODIUM CHLORIDE 0.9 % IV SOLN: INTRAVENOUS | Status: DC

## 2021-12-10 MED ORDER — MIDAZOLAM HCL 2 MG/2ML IJ SOLN
INTRAMUSCULAR | Status: AC
  Filled 2021-12-10: qty 2

## 2021-12-10 MED ORDER — TETRACAINE HCL 1 % IJ SOLN
20.0000 mg | Freq: Once | INTRAMUSCULAR | Status: DC
  Filled 2021-12-10: qty 2

## 2021-12-10 MED ORDER — FENTANYL CITRATE (PF) 100 MCG/2ML IJ SOLN
INTRAMUSCULAR | Status: AC
  Filled 2021-12-10: qty 2

## 2021-12-10 NOTE — Procedures (Signed)
Interventional Radiology Procedure Note  Procedure: US guided left supraclavicular LN biopsy  Indication: Metastatic lymphadenopathy  Findings: Please refer to procedural dictation for full description.  Complications: None  EBL: < 10 mL  Miachel Roux, MD [phone]
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
## 2021-12-10 NOTE — Discharge Instructions (Signed)
Discharge Instructions:   Please call Interventional Radiology clinic [phone] with any questions or concerns.  You may remove your dressing and shower tomorrow.      Moderate Conscious Sedation, Adult, Care After This sheet gives you information about how to care for yourself after your procedure. Your health care provider may also give you more specific instructions. If you have problems or questions, contact your health care provider. What can I expect after the procedure? After the procedure, it is common to have: Sleepiness for several hours. Impaired judgment for several hours. Difficulty with balance. Vomiting if you eat too soon. Follow these instructions at home: For the time period you were told by your health care provider:  Rest. Do not participate in activities where you could fall or become injured. Do not drive or use machinery. Do not drink alcohol. Do not take sleeping pills or medicines that cause drowsiness. Do not make important decisions or sign legal documents. Do not take care of children on your own. Eating and drinking  Follow the diet recommended by your health care provider. Drink enough fluid to keep your urine pale yellow. If you vomit: Drink water, juice, or soup when you can drink without vomiting. Make sure you have little or no nausea before eating solid foods. General instructions Take over-the-counter and prescription medicines only as told by your health care provider. Have a responsible adult stay with you for the time you are told. It is important to have someone help care for you until you are awake and alert. Do not smoke. Keep all follow-up visits as told by your health care provider. This is important. Contact a health care provider if: You are still sleepy or having trouble with balance after 24 hours. You feel light-headed. You keep feeling nauseous or you keep vomiting. You develop a rash. You have a fever. You have redness  or swelling around the IV site. Get help right away if: You have trouble breathing. You have new-onset confusion at home. Summary After the procedure, it is common to feel sleepy, have impaired judgment, or feel nauseous if you eat too soon. Rest after you get home. Know the things you should not do after the procedure. Follow the diet recommended by your health care provider and drink enough fluid to keep your urine pale yellow. Get help right away if you have trouble breathing or new-onset confusion at home. This information is not intended to replace advice given to you by your health care provider. Make sure you discuss any questions you have with your health care provider. Document Revised: 08/05/2019 Document Reviewed: 03/03/2019 Elsevier Patient Education  [place].

## 2021-12-12 ENCOUNTER — Inpatient Hospital Stay: Payer: 59

## 2021-12-12 ENCOUNTER — Inpatient Hospital Stay: Payer: 59 | Admitting: Internal Medicine

## 2021-12-12 ENCOUNTER — Other Ambulatory Visit: Payer: Self-pay

## 2021-12-12 ENCOUNTER — Inpatient Hospital Stay (HOSPITAL_BASED_OUTPATIENT_CLINIC_OR_DEPARTMENT_OTHER): Payer: 59

## 2021-12-12 ENCOUNTER — Encounter: Payer: Self-pay | Admitting: Internal Medicine

## 2021-12-12 VITALS — BP 139/87 | HR 89 | Temp 98.5°F | Resp 15 | Wt 159.5 lb

## 2021-12-12 DIAGNOSIS — C3491 Malignant neoplasm of unspecified part of right bronchus or lung: Secondary | ICD-10-CM

## 2021-12-12 DIAGNOSIS — C3490 Malignant neoplasm of unspecified part of unspecified bronchus or lung: Secondary | ICD-10-CM

## 2021-12-12 DIAGNOSIS — R918 Other nonspecific abnormal finding of lung field: Secondary | ICD-10-CM | POA: Diagnosis not present

## 2021-12-12 LAB — CBC WITH DIFFERENTIAL (CANCER CENTER ONLY)
Abs Immature Granulocytes: 0.02 10*3/uL (ref 0.00–0.07)
Basophils Absolute: 0 10*3/uL (ref 0.0–0.1)
Basophils Relative: 1 %
Eosinophils Absolute: 0.2 10*3/uL (ref 0.0–0.5)
Eosinophils Relative: 2 %
HCT: 40.7 % (ref 36.0–46.0)
Hemoglobin: 13.6 g/dL (ref 12.0–15.0)
Immature Granulocytes: 0 %
Lymphocytes Relative: 23 %
Lymphs Abs: 1.9 10*3/uL (ref 0.7–4.0)
MCH: 30.1 pg (ref 26.0–34.0)
MCHC: 33.4 g/dL (ref 30.0–36.0)
MCV: 90 fL (ref 80.0–100.0)
Monocytes Absolute: 0.8 10*3/uL (ref 0.1–1.0)
Monocytes Relative: 10 %
Neutro Abs: 5.5 10*3/uL (ref 1.7–7.7)
Neutrophils Relative %: 64 %
Platelet Count: 271 10*3/uL (ref 150–400)
RBC: 4.52 MIL/uL (ref 3.87–5.11)
RDW: 14.8 % (ref 11.5–15.5)
WBC Count: 8.5 10*3/uL (ref 4.0–10.5)
nRBC: 0 % (ref 0.0–0.2)

## 2021-12-12 LAB — CMP (CANCER CENTER ONLY)
ALT: 18 U/L (ref 0–44)
AST: 16 U/L (ref 15–41)
Albumin: 4.4 g/dL (ref 3.5–5.0)
Alkaline Phosphatase: 85 U/L (ref 38–126)
Anion gap: 3 — ABNORMAL LOW (ref 5–15)
BUN: 18 mg/dL (ref 8–23)
CO2: 31 mmol/L (ref 22–32)
Calcium: 10.1 mg/dL (ref 8.9–10.3)
Chloride: 106 mmol/L (ref 98–111)
Creatinine: 0.68 mg/dL (ref 0.44–1.00)
GFR, Estimated: >60 mL/min (ref >60)
Glucose, Bld: 119 mg/dL — ABNORMAL HIGH (ref 70–99)
Potassium: 3.8 mmol/L (ref 3.5–5.1)
Sodium: 140 mmol/L (ref 135–145)
Total Bilirubin: 0.3 mg/dL (ref 0.3–1.2)
Total Protein: 7.5 g/dL (ref 6.5–8.1)

## 2021-12-12 LAB — SURGICAL PATHOLOGY

## 2021-12-12 MED ORDER — FOLIC ACID 1 MG PO TABS: 1.0000 mg | ORAL_TABLET | Freq: Every day | ORAL | 4 refills | Status: DC

## 2021-12-12 MED ORDER — PROCHLORPERAZINE MALEATE 10 MG PO TABS: 10.0000 mg | ORAL_TABLET | Freq: Four times a day (QID) | ORAL | 0 refills | Status: DC | PRN

## 2021-12-12 MED ORDER — CYANOCOBALAMIN 1000 MCG/ML IJ SOLN
1000.0000 ug | Freq: Once | INTRAMUSCULAR | Status: AC
  Administered 2021-12-12: 1000 ug via INTRAMUSCULAR
  Filled 2021-12-12: qty 1
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
## 2021-12-12 NOTE — Progress Notes (Signed)
START OFF PATHWAY REGIMEN - Non-Small Cell Lung   [id number]:Pembrolizumab 200 mg  IV D1 + Pemetrexed 500 mg/m2 IV D1 + Carboplatin AUC=5 IV D1 q21 Days:   A cycle is every 21 days:     Pembrolizumab      Pemetrexed      Carboplatin   **Always confirm dose/schedule in your pharmacy ordering system**  Patient Characteristics: Stage IV Metastatic, Nonsquamous, Awaiting Molecular Test Results and Need to Start Chemotherapy, PS = 0, 1 Therapeutic Status: Stage IV Metastatic Histology: Nonsquamous Cell Broad Molecular Profiling Status: Awaiting Molecular Test Results and Need to Start Chemotherapy ECOG Performance Status: 1 Intent of Therapy: Non-Curative / Palliative Intent, Discussed with Patient

## 2021-12-12 NOTE — Patient Instructions (Signed)
Steps to Quit Smoking Smoking tobacco is the leading cause of preventable death. It can affect almost every organ in the body. Smoking puts you and people around you at risk for many serious, long-lasting (chronic) diseases. Quitting smoking can be hard, but it is one of the best things that you can do for your health. It is never too late to quit. Do not give up if you cannot quit the first time. Some people need to try many times to quit. Do your best to stick to your quit plan, and talk with your doctor if you have any questions or concerns. How do I get ready to quit? Pick a date to quit. Set a date within the next 2 weeks to give you time to prepare. Write down the reasons why you are quitting. Keep this list in places where you will see it often. Tell your family, friends, and co-workers that you are quitting. Their support is important. Talk with your doctor about the choices that may help you quit. Find out if your health insurance will pay for these treatments. Know the people, places, things, and activities that make you want to smoke (triggers). Avoid them. What first steps can I take to quit smoking? Throw away all cigarettes at home, at work, and in your car. Throw away the things that you use when you smoke, such as ashtrays and lighters. Clean your car. Empty the ashtray. Clean your home, including curtains and carpets. What can I do to help me quit smoking? Talk with your doctor about taking medicines and seeing a counselor. You are more likely to succeed when you do both. If you are pregnant or breastfeeding: Talk with your doctor about counseling or other ways to quit smoking. Do not take medicine to help you quit smoking unless your doctor tells you to. Quit right away Quit smoking completely, instead of slowly cutting back on how much you smoke over a period of time. Stopping smoking right away may be more successful than slowly quitting. Go to counseling. In-person is best  if this is an option. You are more likely to quit if you go to counseling sessions regularly. Take medicine You may take medicines to help you quit. Some medicines need a prescription, and some you can buy over-the-counter. Some medicines may contain a drug called nicotine to replace the nicotine in cigarettes. Medicines may: Help you stop having the desire to smoke (cravings). Help to stop the problems that come when you stop smoking (withdrawal symptoms). Your doctor may ask you to use: Nicotine patches, gum, or lozenges. Nicotine inhalers or sprays. Non-nicotine medicine that you take by mouth. Find resources Find resources and other ways to help you quit smoking and remain smoke-free after you quit. They include: Online chats with a counselor. Phone quitlines. Printed self-help materials. Support groups or group counseling. Text messaging programs. Mobile phone apps. Use apps on your mobile phone or tablet that can help you stick to your quit plan. Examples of free services include Quit Guide from the CDC and smokefree.gov  What can I do to make it easier to quit?  Talk to your family and friends. Ask them to support and encourage you. Call a phone quitline, such as 1-800-QUIT-NOW, reach out to support groups, or work with a counselor. Ask people who smoke to not smoke around you. Avoid places that make you want to smoke, such as: Bars. Parties. Smoke-break areas at work. Spend time with people who do not smoke. Lower   the stress in your life. Stress can make you want to smoke. Try these things to lower stress: Getting regular exercise. Doing deep-breathing exercises. Doing yoga. Meditating. What benefits will I see if I quit smoking? Over time, you may have: A better sense of smell and taste. Less coughing and sore throat. A slower heart rate. Lower blood pressure. Clearer skin. Better breathing. Fewer sick days. Summary Quitting smoking can be hard, but it is one of  the best things that you can do for your health. Do not give up if you cannot quit the first time. Some people need to try many times to quit. When you decide to quit smoking, make a plan to help you succeed. Quit smoking right away, not slowly over a period of time. When you start quitting, get help and support to keep you smoke-free. This information is not intended to replace advice given to you by your health care provider. Make sure you discuss any questions you have with your health care provider. Document Revised: 03/29/2021 Document Reviewed: 03/29/2021 [employer] Patient Education  2023 [employer]  

## 2021-12-13 ENCOUNTER — Telehealth: Payer: Self-pay | Admitting: Internal Medicine

## 2021-12-13 ENCOUNTER — Other Ambulatory Visit: Payer: Self-pay

## 2021-12-13 NOTE — Telephone Encounter (Signed)
Scheduled per 08/24 los, patient has been called and notified of upcoming appointments.

## 2021-12-16 ENCOUNTER — Telehealth: Payer: Self-pay

## 2021-12-16 ENCOUNTER — Telehealth: Payer: Self-pay | Admitting: Internal Medicine

## 2021-12-16 ENCOUNTER — Encounter: Payer: Self-pay | Admitting: Internal Medicine

## 2021-12-16 NOTE — Progress Notes (Addendum)
Pt called with concern for redness and sensitivity to area of biopsy performed 12/10/21. Returned call to patient where she denies open wound, oozing, pain, fever or chills. Pt states that she feels that maybe the collar of a jacket she wears may have irritated area. Review of image in chart shows area to have mild circumferential irritation with no swelling. Puncture site is clean, dry and almost healed. Pt was advised that she can use antibiotic ointment and cover with gauze to help with irritation. She was educated to call and report pain, swelling, oozing, open wound, fever or chills. Pt verbalized understanding.    Narda Rutherford, AGNP-BC 12/16/2021, 9:41 AM '

## 2021-12-16 NOTE — Telephone Encounter (Signed)
I spoke with pt and her husband who confirmed pt has spoken with IR and received advise from them. Also, I advised pt the area around her incision look like she has been wearing a band-aid. She confirmed this. She has been advised to consider giving her skin a break during the day. Pt expressed understanding of this information.

## 2021-12-16 NOTE — Telephone Encounter (Signed)
Pt and her husband called with a concern that her bx incision site is red and is felt to have spread. Pt and her husband have advised to contact IR at [phone] and to take a picture in the mean time and send it in a message so IR can see it in pts media file.

## 2021-12-17 NOTE — Progress Notes (Signed)
Pharmacist Chemotherapy Monitoring - Initial Assessment    Anticipated start date: 12/25/21   The following has been reviewed per standard work regarding the patient's treatment regimen: The patient's diagnosis, treatment plan and drug doses, and organ/hematologic function Lab orders and baseline tests specific to treatment regimen  The treatment plan start date, drug sequencing, and pre-medications Prior authorization status  Patient's documented medication list, including drug-drug interaction screen and prescriptions for anti-emetics and supportive care specific to the treatment regimen The drug concentrations, fluid compatibility, administration routes, and timing of the medications to be used The patient's access for treatment and lifetime cumulative dose history, if applicable  The patient's medication allergies and previous infusion related reactions, if applicable   Changes made to treatment plan:  N/A  Follow up needed:  F/u prior Laure Kidney, [facility], 12/17/2021  10:33 AM

## 2021-12-18 ENCOUNTER — Telehealth: Payer: Self-pay

## 2021-12-18 ENCOUNTER — Other Ambulatory Visit: Payer: Self-pay

## 2021-12-18 ENCOUNTER — Inpatient Hospital Stay: Payer: 59

## 2021-12-18 NOTE — Telephone Encounter (Signed)
Pt called again wanting to know if she needs to do anything else since her bx was inadequate. Discussed with dr. Julien Nordmann who advised he has the information he needs and pt does not need to repeat her bx. Pt expressed understanding of this information.

## 2021-12-20 ENCOUNTER — Other Ambulatory Visit: Payer: Self-pay

## 2021-12-23 ENCOUNTER — Other Ambulatory Visit: Payer: Self-pay

## 2021-12-23 ENCOUNTER — Other Ambulatory Visit (INDEPENDENT_AMBULATORY_CARE_PROVIDER_SITE_OTHER): Payer: Self-pay | Admitting: Gastroenterology

## 2021-12-24 ENCOUNTER — Other Ambulatory Visit: Payer: Self-pay

## 2021-12-24 MED FILL — Dexamethasone Sodium Phosphate Inj 100 MG/10ML: INTRAMUSCULAR | Qty: 1 | Status: AC

## 2021-12-24 MED FILL — Fosaprepitant Dimeglumine For IV Infusion 150 MG (Base Eq): INTRAVENOUS | Qty: 5 | Status: AC

## 2021-12-24 NOTE — Telephone Encounter (Signed)
Seen 09/26/21 and started on protonix at that visit. Has upcoming appt 10/12

## 2021-12-25 ENCOUNTER — Other Ambulatory Visit: Payer: Self-pay

## 2021-12-25 ENCOUNTER — Inpatient Hospital Stay: Payer: 59 | Admitting: Internal Medicine

## 2021-12-25 ENCOUNTER — Inpatient Hospital Stay: Payer: 59 | Attending: Internal Medicine

## 2021-12-25 ENCOUNTER — Inpatient Hospital Stay: Payer: 59 | Admitting: Physician Assistant

## 2021-12-25 VITALS — BP 135/84 | HR 88 | Temp 98.0°F | Resp 15 | Ht 61.0 in | Wt 161.7 lb

## 2021-12-25 VITALS — BP 105/73 | HR 81 | Temp 98.1°F | Resp 16

## 2021-12-25 DIAGNOSIS — Z87440 Personal history of urinary (tract) infections: Secondary | ICD-10-CM

## 2021-12-25 DIAGNOSIS — C781 Secondary malignant neoplasm of mediastinum: Secondary | ICD-10-CM | POA: Diagnosis not present

## 2021-12-25 DIAGNOSIS — N390 Urinary tract infection, site not specified: Secondary | ICD-10-CM | POA: Insufficient documentation

## 2021-12-25 DIAGNOSIS — Z5111 Encounter for antineoplastic chemotherapy: Secondary | ICD-10-CM | POA: Insufficient documentation

## 2021-12-25 DIAGNOSIS — R110 Nausea: Secondary | ICD-10-CM | POA: Insufficient documentation

## 2021-12-25 DIAGNOSIS — C773 Secondary and unspecified malignant neoplasm of axilla and upper limb lymph nodes: Secondary | ICD-10-CM | POA: Insufficient documentation

## 2021-12-25 DIAGNOSIS — C3491 Malignant neoplasm of unspecified part of right bronchus or lung: Secondary | ICD-10-CM | POA: Diagnosis not present

## 2021-12-25 DIAGNOSIS — C3411 Malignant neoplasm of upper lobe, right bronchus or lung: Secondary | ICD-10-CM | POA: Insufficient documentation

## 2021-12-25 DIAGNOSIS — Z79899 Other long term (current) drug therapy: Secondary | ICD-10-CM | POA: Diagnosis not present

## 2021-12-25 DIAGNOSIS — Z5112 Encounter for antineoplastic immunotherapy: Secondary | ICD-10-CM | POA: Insufficient documentation

## 2021-12-25 LAB — CBC WITH DIFFERENTIAL (CANCER CENTER ONLY)
Abs Immature Granulocytes: 0.05 10*3/uL (ref 0.00–0.07)
Basophils Absolute: 0.1 10*3/uL (ref 0.0–0.1)
Basophils Relative: 1 %
Eosinophils Absolute: 0.2 10*3/uL (ref 0.0–0.5)
Eosinophils Relative: 3 %
HCT: 41 % (ref 36.0–46.0)
Hemoglobin: 13.8 g/dL (ref 12.0–15.0)
Immature Granulocytes: 1 %
Lymphocytes Relative: 26 %
Lymphs Abs: 2.2 10*3/uL (ref 0.7–4.0)
MCH: 30.3 pg (ref 26.0–34.0)
MCHC: 33.7 g/dL (ref 30.0–36.0)
MCV: 89.9 fL (ref 80.0–100.0)
Monocytes Absolute: 0.7 10*3/uL (ref 0.1–1.0)
Monocytes Relative: 8 %
Neutro Abs: 5.5 10*3/uL (ref 1.7–7.7)
Neutrophils Relative %: 61 %
Platelet Count: 286 10*3/uL (ref 150–400)
RBC: 4.56 MIL/uL (ref 3.87–5.11)
RDW: 14.8 % (ref 11.5–15.5)
WBC Count: 8.7 10*3/uL (ref 4.0–10.5)
nRBC: 0 % (ref 0.0–0.2)

## 2021-12-25 LAB — URINALYSIS, COMPLETE (UACMP) WITH MICROSCOPIC
Bacteria, UA: NONE SEEN
Bilirubin Urine: NEGATIVE
Glucose, UA: NEGATIVE mg/dL
Ketones, ur: NEGATIVE mg/dL
Leukocytes,Ua: NEGATIVE
Nitrite: NEGATIVE
Protein, ur: NEGATIVE mg/dL
Specific Gravity, Urine: 1.012 (ref 1.005–1.030)
pH: 5 (ref 5.0–8.0)

## 2021-12-25 LAB — CMP (CANCER CENTER ONLY)
ALT: 18 U/L (ref 0–44)
AST: 17 U/L (ref 15–41)
Albumin: 4.3 g/dL (ref 3.5–5.0)
Alkaline Phosphatase: 74 U/L (ref 38–126)
Anion gap: 6 (ref 5–15)
BUN: 20 mg/dL (ref 8–23)
CO2: 29 mmol/L (ref 22–32)
Calcium: 10.5 mg/dL — ABNORMAL HIGH (ref 8.9–10.3)
Chloride: 106 mmol/L (ref 98–111)
Creatinine: 0.86 mg/dL (ref 0.44–1.00)
GFR, Estimated: >60 mL/min (ref >60)
Glucose, Bld: 89 mg/dL (ref 70–99)
Potassium: 3.6 mmol/L (ref 3.5–5.1)
Sodium: 141 mmol/L (ref 135–145)
Total Bilirubin: 0.3 mg/dL (ref 0.3–1.2)
Total Protein: 7.6 g/dL (ref 6.5–8.1)

## 2021-12-25 LAB — TSH: TSH: 1.875 u[IU]/mL (ref 0.350–4.500)

## 2021-12-25 MED ORDER — PALONOSETRON HCL INJECTION 0.25 MG/5ML
0.2500 mg | Freq: Once | INTRAVENOUS | Status: AC
  Administered 2021-12-25: 0.25 mg via INTRAVENOUS
  Filled 2021-12-25: qty 5

## 2021-12-25 MED ORDER — SODIUM CHLORIDE 0.9 % IV SOLN
10.0000 mg | Freq: Once | INTRAVENOUS | Status: AC
  Administered 2021-12-25: 10 mg via INTRAVENOUS
  Filled 2021-12-25: qty 10

## 2021-12-25 MED ORDER — SODIUM CHLORIDE 0.9 % IV SOLN
150.0000 mg | Freq: Once | INTRAVENOUS | Status: AC
  Administered 2021-12-25: 150 mg via INTRAVENOUS
  Filled 2021-12-25: qty 150

## 2021-12-25 MED ORDER — SODIUM CHLORIDE 0.9 % IV SOLN: Freq: Once | INTRAVENOUS | Status: AC

## 2021-12-25 MED ORDER — SODIUM CHLORIDE 0.9 % IV SOLN
500.0000 mg/m2 | Freq: Once | INTRAVENOUS | Status: AC
  Administered 2021-12-25: 900 mg via INTRAVENOUS
  Filled 2021-12-25: qty 20

## 2021-12-25 MED ORDER — SODIUM CHLORIDE 0.9 % IV SOLN
530.0000 mg | Freq: Once | INTRAVENOUS | Status: AC
  Administered 2021-12-25: 530 mg via INTRAVENOUS
  Filled 2021-12-25: qty 53

## 2021-12-25 MED ORDER — SODIUM CHLORIDE 0.9 % IV SOLN
200.0000 mg | Freq: Once | INTRAVENOUS | Status: AC
  Administered 2021-12-25: 200 mg via INTRAVENOUS
  Filled 2021-12-25: qty 200
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
## 2021-12-25 NOTE — Patient Instructions (Signed)
[facility]COL[facility]  Discharge Instructions: Thank you for choosing [facility] to provide your oncology and hematology care.   If you have a lab appointment with the [facility], please go directly to the [facility] and check in at the registration area.   Wear comfortable clothing and clothing appropriate for easy access to any Portacath or PICC line.   We strive to give you quality time with your provider. You may need to reschedule your appointment if you arrive late (15 or more minutes).  Arriving late affects you and other patients whose appointments are after yours.  Also, if you miss three or more appointments without notifying the office, you may be dismissed from the clinic at the provider's discretion.      For prescription refill requests, have your pharmacy contact our office and allow 72 hours for refills to be completed.    Today you received the following chemotherapy and/or immunotherapy agents: Keytruda/Alimta/Carboplatin      To help prevent nausea and vomiting after your treatment, we encourage you to take your nausea medication as directed.  BELOW ARE SYMPTOMS THAT SHOULD BE REPORTED IMMEDIATELY: *FEVER GREATER THAN 100.4 F (38 C) OR HIGHER *CHILLS OR SWEATING *NAUSEA AND VOMITING THAT IS NOT CONTROLLED WITH YOUR NAUSEA MEDICATION *UNUSUAL SHORTNESS OF BREATH *UNUSUAL BRUISING OR BLEEDING *URINARY PROBLEMS (pain or burning when urinating, or frequent urination) *BOWEL PROBLEMS (unusual diarrhea, constipation, pain near the anus) TENDERNESS IN MOUTH AND THROAT WITH OR WITHOUT PRESENCE OF ULCERS (sore throat, sores in mouth, or a toothache) UNUSUAL RASH, SWELLING OR PAIN  UNUSUAL VAGINAL DISCHARGE OR ITCHING   Items with * indicate a potential emergency and should be followed up as soon as possible or go to the Emergency Department if any problems should occur.  Please show the CHEMOTHERAPY ALERT CARD or IMMUNOTHERAPY ALERT  CARD at check-in to the Emergency Department and triage nurse.  Should you have questions after your visit or need to cancel or reschedule your appointment, please contact [facility]  Dept: [phone]  and follow the prompts.  Office hours are 8:00 a.m. to 4:30 p.m. Monday - Friday. Please note that voicemails left after 4:00 p.m. may not be returned until the following business day.  We are closed weekends and major holidays. You have access to a nurse at all times for urgent questions. Please call the main number to the clinic Dept: [phone] and follow the prompts.   For any non-urgent questions, you may also contact your provider using MyChart. We now offer e-Visits for anyone 59 and older to request care online for non-urgent symptoms. For details visit [email or web address].   Also download the MyChart app! Go to the app store, search "MyChart", open the app, select [facility], and log in with your MyChart username and password.  Masks are optional in the cancer centers. If you would like for your care team to wear a mask while they are taking care of you, please let them know. You may have one support person who is at least 65 years old accompany you for your appointments.

## 2021-12-26 ENCOUNTER — Telehealth: Payer: Self-pay

## 2021-12-26 LAB — URINE CULTURE

## 2021-12-26 LAB — T4: T4, Total: 7.5 ug/dL (ref 4.5–12.0)

## 2021-12-26 NOTE — Telephone Encounter (Signed)
-----   Message from Daphane Shepherd, RN sent at 12/25/2021  3:09 PM EDT ----- Regarding: First time Yvette Curry Patient of Dr Julien Nordmann, first time [place], and [place]. Tolerated treatment well. VSS, and no signs of distress noted at discharge.

## 2021-12-26 NOTE — Telephone Encounter (Signed)
Yvette Curry states that she is doing fine. She is eating, drinking, and urinating well. She knows to call the office at [phone] if she has any questions or concerns.

## 2021-12-26 NOTE — Telephone Encounter (Signed)
T/C to pt to advise of urine test result and the offer to repeat urine.  She has an appt today with Dr Gloriann Loan and pt is going to have him repeat the urine test.

## 2021-12-27 ENCOUNTER — Telehealth: Payer: Self-pay

## 2021-12-27 NOTE — Telephone Encounter (Signed)
Pt called stating she has been constipation. Pt states she has not had a BM for 2 days. She states she has tried walking, prune juice and drinking water. Pt has been advised she can take Miralax and continue to take it until she has a BM. If this does not work, she know to call the office back.

## 2021-12-29 ENCOUNTER — Encounter: Payer: Self-pay | Admitting: Internal Medicine

## 2021-12-30 LAB — GUARDANT 360

## 2021-12-31 ENCOUNTER — Telehealth: Payer: Self-pay

## 2021-12-31 NOTE — Telephone Encounter (Signed)
Pt alled back advising she has been prescribed Cipro for her UTI and wants to know if it is okay to take. Pt has been advised it is ok to move forward with her abx.

## 2021-12-31 NOTE — Telephone Encounter (Signed)
Pt called stating her IV site is sore and a little red. Pt denies any red streaks and heat with touching. Pt has been advised to use warm compresses and massage the area. Pt was advised to call back if her sx worsened. Pt expressed understanding of this information.

## 2022-01-01 ENCOUNTER — Encounter: Payer: Self-pay | Admitting: Internal Medicine

## 2022-01-09 ENCOUNTER — Encounter: Payer: Self-pay | Admitting: Internal Medicine

## 2022-01-10 ENCOUNTER — Encounter: Payer: Self-pay | Admitting: Internal Medicine

## 2022-01-10 NOTE — Telephone Encounter (Signed)
I have spoken with the pt and advised that a letter has been received from her dentist and will be discussed with Dr. Julien Nordmann. Pt expressed understanding of this information.

## 2022-01-14 ENCOUNTER — Encounter: Payer: Self-pay | Admitting: Internal Medicine

## 2022-01-14 MED FILL — Fosaprepitant Dimeglumine For IV Infusion 150 MG (Base Eq): INTRAVENOUS | Qty: 5 | Status: AC

## 2022-01-14 MED FILL — Dexamethasone Sodium Phosphate Inj 100 MG/10ML: INTRAMUSCULAR | Qty: 1 | Status: AC

## 2022-01-14 NOTE — Progress Notes (Signed)
Called pt to introduce myself as her Arboriculturist and to discuss the [employer].  Pt declined wanting to leave it for someone in greater need.  I will request the registration staff give her my card in case she changes her mind and for any questions or concerns she may have in the future.

## 2022-01-15 ENCOUNTER — Other Ambulatory Visit: Payer: Self-pay

## 2022-01-15 ENCOUNTER — Other Ambulatory Visit: Payer: Self-pay | Admitting: Internal Medicine

## 2022-01-15 ENCOUNTER — Other Ambulatory Visit (HOSPITAL_COMMUNITY): Payer: Self-pay

## 2022-01-15 ENCOUNTER — Encounter: Payer: Self-pay | Admitting: Internal Medicine

## 2022-01-15 ENCOUNTER — Inpatient Hospital Stay: Payer: 59

## 2022-01-15 ENCOUNTER — Inpatient Hospital Stay (HOSPITAL_BASED_OUTPATIENT_CLINIC_OR_DEPARTMENT_OTHER): Payer: 59 | Admitting: Internal Medicine

## 2022-01-15 VITALS — BP 127/75 | HR 60 | Resp 16

## 2022-01-15 VITALS — BP 145/95 | HR 87 | Temp 98.1°F | Resp 15 | Wt 160.0 lb

## 2022-01-15 DIAGNOSIS — Z5112 Encounter for antineoplastic immunotherapy: Secondary | ICD-10-CM | POA: Diagnosis not present

## 2022-01-15 DIAGNOSIS — C3491 Malignant neoplasm of unspecified part of right bronchus or lung: Secondary | ICD-10-CM

## 2022-01-15 LAB — CMP (CANCER CENTER ONLY)
ALT: 43 U/L (ref 0–44)
AST: 31 U/L (ref 15–41)
Albumin: 4.3 g/dL (ref 3.5–5.0)
Alkaline Phosphatase: 80 U/L (ref 38–126)
Anion gap: 8 (ref 5–15)
BUN: 17 mg/dL (ref 8–23)
CO2: 28 mmol/L (ref 22–32)
Calcium: 9.8 mg/dL (ref 8.9–10.3)
Chloride: 105 mmol/L (ref 98–111)
Creatinine: 0.94 mg/dL (ref 0.44–1.00)
GFR, Estimated: >60 mL/min (ref >60)
Glucose, Bld: 93 mg/dL (ref 70–99)
Potassium: 3.8 mmol/L (ref 3.5–5.1)
Sodium: 141 mmol/L (ref 135–145)
Total Bilirubin: 0.3 mg/dL (ref 0.3–1.2)
Total Protein: 7.3 g/dL (ref 6.5–8.1)

## 2022-01-15 LAB — CBC WITH DIFFERENTIAL (CANCER CENTER ONLY)
Abs Immature Granulocytes: 0.06 10*3/uL (ref 0.00–0.07)
Basophils Absolute: 0.1 10*3/uL (ref 0.0–0.1)
Basophils Relative: 1 %
Eosinophils Absolute: 0.2 10*3/uL (ref 0.0–0.5)
Eosinophils Relative: 3 %
HCT: 39.5 % (ref 36.0–46.0)
Hemoglobin: 13.6 g/dL (ref 12.0–15.0)
Immature Granulocytes: 1 %
Lymphocytes Relative: 46 %
Lymphs Abs: 3 10*3/uL (ref 0.7–4.0)
MCH: 30.8 pg (ref 26.0–34.0)
MCHC: 34.4 g/dL (ref 30.0–36.0)
MCV: 89.6 fL (ref 80.0–100.0)
Monocytes Absolute: 0.9 10*3/uL (ref 0.1–1.0)
Monocytes Relative: 14 %
Neutro Abs: 2.2 10*3/uL (ref 1.7–7.7)
Neutrophils Relative %: 35 %
Platelet Count: 234 10*3/uL (ref 150–400)
RBC: 4.41 MIL/uL (ref 3.87–5.11)
RDW: 15.9 % — ABNORMAL HIGH (ref 11.5–15.5)
WBC Count: 6.3 10*3/uL (ref 4.0–10.5)
nRBC: 0 % (ref 0.0–0.2)

## 2022-01-15 MED ORDER — SODIUM CHLORIDE 0.9 % IV SOLN: Freq: Once | INTRAVENOUS | Status: AC

## 2022-01-15 MED ORDER — FLUCONAZOLE 100 MG PO TABS: 100.0000 mg | ORAL_TABLET | Freq: Every day | ORAL | 0 refills | Status: DC

## 2022-01-15 MED ORDER — SODIUM CHLORIDE 0.9 % IV SOLN
10.0000 mg | Freq: Once | INTRAVENOUS | Status: AC
  Administered 2022-01-15: 10 mg via INTRAVENOUS
  Filled 2022-01-15: qty 10

## 2022-01-15 MED ORDER — SODIUM CHLORIDE 0.9 % IV SOLN
200.0000 mg | Freq: Once | INTRAVENOUS | Status: AC
  Administered 2022-01-15: 200 mg via INTRAVENOUS
  Filled 2022-01-15: qty 200

## 2022-01-15 MED ORDER — SODIUM CHLORIDE 0.9 % IV SOLN
470.0000 mg | Freq: Once | INTRAVENOUS | Status: AC
  Administered 2022-01-15: 470 mg via INTRAVENOUS
  Filled 2022-01-15: qty 47

## 2022-01-15 MED ORDER — SODIUM CHLORIDE 0.9 % IV SOLN
500.0000 mg/m2 | Freq: Once | INTRAVENOUS | Status: AC
  Administered 2022-01-15: 900 mg via INTRAVENOUS
  Filled 2022-01-15: qty 20

## 2022-01-15 MED ORDER — PALONOSETRON HCL INJECTION 0.25 MG/5ML
0.2500 mg | Freq: Once | INTRAVENOUS | Status: AC
  Administered 2022-01-15: 0.25 mg via INTRAVENOUS
  Filled 2022-01-15: qty 5

## 2022-01-15 MED ORDER — LIDOCAINE-PRILOCAINE 2.5-2.5 % EX CREA: TOPICAL_CREAM | CUTANEOUS | 0 refills | Status: DC

## 2022-01-15 MED ORDER — SODIUM CHLORIDE 0.9 % IV SOLN
150.0000 mg | Freq: Once | INTRAVENOUS | Status: AC
  Administered 2022-01-15: 150 mg via INTRAVENOUS
  Filled 2022-01-15: qty 150

## 2022-01-15 NOTE — Patient Instructions (Signed)
Steps to Quit Smoking Smoking tobacco is the leading cause of preventable death. It can affect almost every organ in the body. Smoking puts you and people around you at risk for many serious, long-lasting (chronic) diseases. Quitting smoking can be hard, but it is one of the best things that you can do for your health. It is never too late to quit. Do not give up if you cannot quit the first time. Some people need to try many times to quit. Do your best to stick to your quit plan, and talk with your doctor if you have any questions or concerns. How do I get ready to quit? Pick a date to quit. Set a date within the next 2 weeks to give you time to prepare. Write down the reasons why you are quitting. Keep this list in places where you will see it often. Tell your family, friends, and co-workers that you are quitting. Their support is important. Talk with your doctor about the choices that may help you quit. Find out if your health insurance will pay for these treatments. Know the people, places, things, and activities that make you want to smoke (triggers). Avoid them. What first steps can I take to quit smoking? Throw away all cigarettes at home, at work, and in your car. Throw away the things that you use when you smoke, such as ashtrays and lighters. Clean your car. Empty the ashtray. Clean your home, including curtains and carpets. What can I do to help me quit smoking? Talk with your doctor about taking medicines and seeing a counselor. You are more likely to succeed when you do both. If you are pregnant or breastfeeding: Talk with your doctor about counseling or other ways to quit smoking. Do not take medicine to help you quit smoking unless your doctor tells you to. Quit right away Quit smoking completely, instead of slowly cutting back on how much you smoke over a period of time. Stopping smoking right away may be more successful than slowly quitting. Go to counseling. In-person is best  if this is an option. You are more likely to quit if you go to counseling sessions regularly. Take medicine You may take medicines to help you quit. Some medicines need a prescription, and some you can buy over-the-counter. Some medicines may contain a drug called nicotine to replace the nicotine in cigarettes. Medicines may: Help you stop having the desire to smoke (cravings). Help to stop the problems that come when you stop smoking (withdrawal symptoms). Your doctor may ask you to use: Nicotine patches, gum, or lozenges. Nicotine inhalers or sprays. Non-nicotine medicine that you take by mouth. Find resources Find resources and other ways to help you quit smoking and remain smoke-free after you quit. They include: Online chats with a counselor. Phone quitlines. Printed self-help materials. Support groups or group counseling. Text messaging programs. Mobile phone apps. Use apps on your mobile phone or tablet that can help you stick to your quit plan. Examples of free services include Quit Guide from the CDC and smokefree.gov  What can I do to make it easier to quit?  Talk to your family and friends. Ask them to support and encourage you. Call a phone quitline, such as 1-800-QUIT-NOW, reach out to support groups, or work with a counselor. Ask people who smoke to not smoke around you. Avoid places that make you want to smoke, such as: Bars. Parties. Smoke-break areas at work. Spend time with people who do not smoke. Lower   the stress in your life. Stress can make you want to smoke. Try these things to lower stress: Getting regular exercise. Doing deep-breathing exercises. Doing yoga. Meditating. What benefits will I see if I quit smoking? Over time, you may have: A better sense of smell and taste. Less coughing and sore throat. A slower heart rate. Lower blood pressure. Clearer skin. Better breathing. Fewer sick days. Summary Quitting smoking can be hard, but it is one of  the best things that you can do for your health. Do not give up if you cannot quit the first time. Some people need to try many times to quit. When you decide to quit smoking, make a plan to help you succeed. Quit smoking right away, not slowly over a period of time. When you start quitting, get help and support to keep you smoke-free. This information is not intended to replace advice given to you by your health care provider. Make sure you discuss any questions you have with your health care provider. Document Revised: 03/29/2021 Document Reviewed: 03/29/2021 [employer] Patient Education  2023 [employer]  
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
## 2022-01-15 NOTE — Patient Instructions (Signed)
[facility] ONCOLOGY  Discharge Instructions: Thank you for choosing [facility] to provide your oncology and hematology care.   If you have a lab appointment with the [facility], please go directly to the [facility] and check in at the registration area.   Wear comfortable clothing and clothing appropriate for easy access to any Portacath or PICC line.   We strive to give you quality time with your provider. You may need to reschedule your appointment if you arrive late (15 or more minutes).  Arriving late affects you and other patients whose appointments are after yours.  Also, if you miss three or more appointments without notifying the office, you may be dismissed from the clinic at the provider's discretion.      For prescription refill requests, have your pharmacy contact our office and allow 72 hours for refills to be completed.    Today you received the following chemotherapy and/or immunotherapy agents: Keytruda/Alimta/Carboplatin      To help prevent nausea and vomiting after your treatment, we encourage you to take your nausea medication as directed.  BELOW ARE SYMPTOMS THAT SHOULD BE REPORTED IMMEDIATELY: *FEVER GREATER THAN 100.4 F (38 C) OR HIGHER *CHILLS OR SWEATING *NAUSEA AND VOMITING THAT IS NOT CONTROLLED WITH YOUR NAUSEA MEDICATION *UNUSUAL SHORTNESS OF BREATH *UNUSUAL BRUISING OR BLEEDING *URINARY PROBLEMS (pain or burning when urinating, or frequent urination) *BOWEL PROBLEMS (unusual diarrhea, constipation, pain near the anus) TENDERNESS IN MOUTH AND THROAT WITH OR WITHOUT PRESENCE OF ULCERS (sore throat, sores in mouth, or a toothache) UNUSUAL RASH, SWELLING OR PAIN  UNUSUAL VAGINAL DISCHARGE OR ITCHING   Items with * indicate a potential emergency and should be followed up as soon as possible or go to the Emergency Department if any problems should occur.  Please show the CHEMOTHERAPY ALERT CARD or IMMUNOTHERAPY ALERT  CARD at check-in to the Emergency Department and triage nurse.  Should you have questions after your visit or need to cancel or reschedule your appointment, please contact [facility]  Dept: [phone]  and follow the prompts.  Office hours are 8:00 a.m. to 4:30 p.m. Monday - Friday. Please note that voicemails left after 4:00 p.m. may not be returned until the following business day.  We are closed weekends and major holidays. You have access to a nurse at all times for urgent questions. Please call the main number to the clinic Dept: [phone] and follow the prompts.   For any non-urgent questions, you may also contact your provider using MyChart. We now offer e-Visits for anyone 32 and older to request care online for non-urgent symptoms. For details visit [email or web address].   Also download the MyChart app! Go to the app store, search "MyChart", open the app, select [facility], and log in with your MyChart username and password.  Masks are optional in the cancer centers. If you would like for your care team to wear a mask while they are taking care of you, please let them know. You may have one support person who is at least 65 years old accompany you for your appointments. Implanted [facility] Guide An implanted port is a device that is placed under the skin. It is usually placed in the chest. The device may vary based on the need. Implanted ports can be used to give IV medicine, to take blood, or to give fluids. You may have an implanted port if: You need IV medicine that would be irritating  to the small veins in your hands or arms. You need IV medicines, such as chemotherapy, for a long period of time. You need IV nutrition for a long period of time. You may have fewer limitations when using a port than you would if you used other types of long-term IVs. You will also likely be able to return to normal activities after your incision heals. An implanted  port has two main parts: Reservoir. The reservoir is the part where a needle is inserted to give medicines or draw blood. The reservoir is round. After the port is placed, it appears as a small, raised area under your skin. Catheter. The catheter is a small, thin tube that connects the reservoir to a vein. Medicine that is inserted into the reservoir goes into the catheter and then into the vein. How is my port accessed? To access your port: A numbing cream may be placed on the skin over the port site. Your health care provider will put on a mask and sterile gloves. The skin over your port will be cleaned carefully with a germ-killing soap and allowed to dry. Your health care provider will gently pinch the port and insert a needle into it. Your health care provider will check for a blood return to make sure the port is in the vein and is still working (patent). If your port needs to remain accessed to get medicine continuously (constant infusion), your health care provider will place a clear bandage (dressing) over the needle site. The dressing and needle will need to be changed every week, or as told by your health care provider. What is flushing? Flushing helps keep the port working. Follow instructions from your health care provider about how and when to flush the port. Ports are usually flushed with saline solution or a medicine called heparin. The need for flushing will depend on how the port is used: If the port is only used from time to time to give medicines or draw blood, the port may need to be flushed: Before and after medicines have been given. Before and after blood has been drawn. As part of routine maintenance. Flushing may be recommended every 4-6 weeks. If a constant infusion is running, the port may not need to be flushed. Throw away any syringes in a disposal container that is meant for sharp items (sharps container). You can buy a sharps container from a pharmacy, or you can  make one by using an empty hard plastic bottle with a cover. How long will my port stay implanted? The port can stay in for as long as your health care provider thinks it is needed. When it is time for the port to come out, a surgery will be done to remove it. The surgery will be similar to the procedure that was done to put the port in. Follow these instructions at home: Caring for your port and port site Flush your port as told by your health care provider. If you need an infusion over several days, follow instructions from your health care provider about how to take care of your port site. Make sure you: Change your dressing as told by your health care provider. Wash your hands with soap and water for at least 20 seconds before and after you change your dressing. If soap and water are not available, use alcohol-based hand sanitizer. Place any used dressings or infusion bags into a plastic bag. Throw that bag in the trash. Keep the dressing that covers the  needle clean and dry. Do not get it wet. Do not use scissors or sharp objects near the infusion tubing. Keep any external tubes clamped, unless they are being used. Check your port site every day for signs of infection. Check for: Redness, swelling, or pain. Fluid or blood. Warmth. Pus or a bad smell. Protect the skin around the port site. Avoid wearing bra straps that rub or irritate the site. Protect the skin around your port from seat belts. Place a soft pad over your chest if needed. Bathe or shower as told by your health care provider. The site may get wet as long as you are not actively receiving an infusion. General instructions  Return to your normal activities as told by your health care provider. Ask your health care provider what activities are safe for you. Carry a medical alert card or wear a medical alert bracelet at all times. This will let health care providers know that you have an implanted port in case of an  emergency. Where to find more information American Cancer Society: www.cancer.[place] of Clinical Oncology: www.cancer.net Contact a health care provider if: You have a fever or chills. You have redness, swelling, or pain at the port site. You have fluid or blood coming from your port site. Your incision feels warm to the touch. You have pus or a bad smell coming from the port site. Summary Implanted ports are usually placed in the chest for long-term IV access. Follow instructions from your health care provider about flushing the port and changing bandages (dressings). Take care of the area around your port by avoiding clothing that puts pressure on the area, and by watching for signs of infection. Protect the skin around your port from seat belts. Place a soft pad over your chest if needed. Contact a health care provider if you have a fever or you have redness, swelling, pain, fluid, or a bad smell at the port site. This information is not intended to replace advice given to you by your health care provider. Make sure you discuss any questions you have with your health care provider. Document Revised: 10/09/2020 Document Reviewed: 10/09/2020 Elsevier Patient Education  [place].

## 2022-01-16 ENCOUNTER — Telehealth: Payer: Self-pay | Admitting: Medical Oncology

## 2022-01-16 NOTE — Telephone Encounter (Signed)
Tooth over implant needs replacement. OK with Mohamed to replace tooth . I told pt Julien Nordmann said she does not need weekly labs. Pt voiced understanding.

## 2022-01-22 ENCOUNTER — Encounter: Payer: Self-pay | Admitting: Internal Medicine

## 2022-01-23 ENCOUNTER — Telehealth: Payer: Self-pay | Admitting: Medical Oncology

## 2022-01-23 NOTE — Telephone Encounter (Signed)
Laxative worked- Pt had good BM results 16 hours after taking the milk of mag (120 ml ). Her stool is liquid now.  She was instructed to start back on miralax daily when the stool becomes formed  and to keep the MOM , senakot -s , colace and glycerine suppositories  available for prn use.

## 2022-01-27 ENCOUNTER — Telehealth: Payer: Self-pay | Admitting: Medical Oncology

## 2022-01-27 NOTE — Telephone Encounter (Signed)
Abdominal pain -"tender area between belly button and breasts" x 2-3 days "Is it normal for your stomach to feel weird like this after treatment"?  10/04-last Wed she reported no BM since Sept 27th.  She was instructed by Julien Nordmann  to take MOM and glycerine supp.  and had results 16 hrs later on Thursday.   Today she had a formed stool. Her normal BM pattern before initial tx was several times a day.  CT scan-- -Needs order for CT scan so she can schedule it soon because her husband is travelling in Nov. Her next appt is Nov 9th.

## 2022-01-27 NOTE — Telephone Encounter (Signed)
Per Dr Julien Nordmann , monitor abd pain for now.  If symptoms worsen go to ED.  I asked her to call back tomorrow with update on abd pain.   Pt had a second normal BM today .  I told her to continue miralax and stool softener unless she starts having liquid stools then cut back on stool softener.  CT scan- I told her to expect it on nov 7th.

## 2022-01-28 ENCOUNTER — Other Ambulatory Visit: Payer: Self-pay

## 2022-01-28 ENCOUNTER — Telehealth: Payer: Self-pay | Admitting: Medical Oncology

## 2022-01-28 NOTE — Telephone Encounter (Signed)
Having normal BM . Denies abdominal pain .  Instructed to keep appts.

## 2022-01-29 ENCOUNTER — Other Ambulatory Visit: Payer: Self-pay | Admitting: Student

## 2022-01-29 DIAGNOSIS — C3491 Malignant neoplasm of unspecified part of right bronchus or lung: Secondary | ICD-10-CM

## 2022-01-30 ENCOUNTER — Encounter (HOSPITAL_COMMUNITY): Payer: Self-pay

## 2022-01-30 ENCOUNTER — Ambulatory Visit (INDEPENDENT_AMBULATORY_CARE_PROVIDER_SITE_OTHER): Payer: 59 | Admitting: Gastroenterology

## 2022-01-30 ENCOUNTER — Ambulatory Visit (HOSPITAL_COMMUNITY)
Admission: RE | Admit: 2022-01-30 | Discharge: 2022-01-30 | Disposition: A | Discharge status: Home / Self Care | Payer: 59 | Source: Ambulatory Visit

## 2022-01-30 ENCOUNTER — Other Ambulatory Visit: Payer: Self-pay

## 2022-01-30 ENCOUNTER — Ambulatory Visit (HOSPITAL_COMMUNITY)
Admission: RE | Admit: 2022-01-30 | Discharge: 2022-01-30 | Disposition: A | Discharge status: Home / Self Care | Payer: 59 | Source: Ambulatory Visit | Attending: Internal Medicine | Admitting: Internal Medicine

## 2022-01-30 DIAGNOSIS — F17210 Nicotine dependence, cigarettes, uncomplicated: Secondary | ICD-10-CM | POA: Insufficient documentation

## 2022-01-30 DIAGNOSIS — C3491 Malignant neoplasm of unspecified part of right bronchus or lung: Secondary | ICD-10-CM | POA: Diagnosis present

## 2022-01-30 DIAGNOSIS — F419 Anxiety disorder, unspecified: Secondary | ICD-10-CM | POA: Insufficient documentation

## 2022-01-30 DIAGNOSIS — F329 Major depressive disorder, single episode, unspecified: Secondary | ICD-10-CM | POA: Insufficient documentation

## 2022-01-30 HISTORY — PX: IR IMAGING GUIDED PORT INSERTION: IMG5740

## 2022-01-30 MED ORDER — MIDAZOLAM HCL 2 MG/2ML IJ SOLN
INTRAMUSCULAR | Status: AC
  Filled 2022-01-30: qty 4

## 2022-01-30 MED ORDER — TETRACAINE HCL 1 % IJ SOLN
200.0000 mg | Freq: Once | INTRAMUSCULAR | Status: AC
  Administered 2022-01-30: 13 mL via INTRADERMAL
  Filled 2022-01-30 (×2): qty 20

## 2022-01-30 MED ORDER — FENTANYL CITRATE (PF) 100 MCG/2ML IJ SOLN
INTRAMUSCULAR | Status: AC | PRN
  Administered 2022-01-30: 50 ug via INTRAVENOUS
  Administered 2022-01-30: 100 ug via INTRAVENOUS

## 2022-01-30 MED ORDER — MIDAZOLAM HCL 2 MG/2ML IJ SOLN
INTRAMUSCULAR | Status: AC | PRN
  Administered 2022-01-30: 2 mg via INTRAVENOUS
  Administered 2022-01-30: 1 mg via INTRAVENOUS

## 2022-01-30 MED ORDER — HEPARIN SOD (PORK) LOCK FLUSH 100 UNIT/ML IV SOLN
INTRAVENOUS | Status: AC
  Administered 2022-01-30: 500 [IU] via INTRAVENOUS
  Filled 2022-01-30: qty 5

## 2022-01-30 MED ORDER — FENTANYL CITRATE (PF) 100 MCG/2ML IJ SOLN
INTRAMUSCULAR | Status: AC
  Filled 2022-01-30: qty 4

## 2022-01-30 MED ORDER — SODIUM CHLORIDE 0.9 % IV SOLN: INTRAVENOUS | Status: DC

## 2022-01-30 MED ORDER — LIDOCAINE HCL 1 % IJ SOLN
INTRAMUSCULAR | Status: AC
  Filled 2022-01-30: qty 20

## 2022-01-30 NOTE — Procedures (Signed)
Interventional Radiology Procedure Note  Procedure: Placement of a right IJ approach single lumen PowerPort.  Tip is positioned at the superior cavoatrial junction and catheter is ready for immediate use.  Complications: None Recommendations:  - Ok to shower tomorrow - Do not submerge for 7 days - Routine line care   Signed,  Lyne Khurana S. Zyanna Leisinger, DO   
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
## 2022-01-30 NOTE — Discharge Instructions (Signed)
Discharge Instructions:   Please call Interventional Radiology clinic [phone] with any questions or concerns.  You may remove your dressing and shower tomorrow.  Do not use EMLA / Lidocaine cream for 2 weeks post Port Insertion.   Moderate Conscious Sedation, Adult, Care After This sheet gives you information about how to care for yourself after your procedure. Your health care provider may also give you more specific instructions. If you have problems or questions, contact your health care provider. What can I expect after the procedure? After the procedure, it is common to have: Sleepiness for several hours. Impaired judgment for several hours. Difficulty with balance. Vomiting if you eat too soon. Follow these instructions at home: For the time period you were told by your health care provider:  Rest. Do not participate in activities where you could fall or become injured. Do not drive or use machinery. Do not drink alcohol. Do not take sleeping pills or medicines that cause drowsiness. Do not make important decisions or sign legal documents. Do not take care of children on your own. Eating and drinking  Follow the diet recommended by your health care provider. Drink enough fluid to keep your urine pale yellow. If you vomit: Drink water, juice, or soup when you can drink without vomiting. Make sure you have little or no nausea before eating solid foods. General instructions Take over-the-counter and prescription medicines only as told by your health care provider. Have a responsible adult stay with you for the time you are told. It is important to have someone help care for you until you are awake and alert. Do not smoke. Keep all follow-up visits as told by your health care provider. This is important. Contact a health care provider if: You are still sleepy or having trouble with balance after 24 hours. You feel light-headed. You keep feeling nauseous or you keep  vomiting. You develop a rash. You have a fever. You have redness or swelling around the IV site. Get help right away if: You have trouble breathing. You have new-onset confusion at home. Summary After the procedure, it is common to feel sleepy, have impaired judgment, or feel nauseous if you eat too soon. Rest after you get home. Know the things you should not do after the procedure. Follow the diet recommended by your health care provider and drink enough fluid to keep your urine pale yellow. Get help right away if you have trouble breathing or new-onset confusion at home. This information is not intended to replace advice given to you by your health care provider. Make sure you discuss any questions you have with your health care provider. Document Revised: 08/05/2019 Document Reviewed: 03/03/2019 Elsevier Patient Education  [place] Insertion, Care After The following information offers guidance on how to care for yourself after your procedure. Your health care provider may also give you more specific instructions. If you have problems or questions, contact your health care provider. What can I expect after the procedure? After the procedure, it is common to have: Discomfort at the port insertion site. Bruising on the skin over the port. This should improve over 3-4 days. Follow these instructions at home: [facility] care After your port is placed, you will get a manufacturer's information card. The card has information about your port. Keep this card with you at all times. Take care of the port as told by your health care provider. Ask your health care provider if you or a family  member can get training for taking care of the port at home. A home health care nurse will be be available to help care for the port. Make sure to remember what type of port you have. Incision care     Follow instructions from your health care provider about how to take care of  your port insertion site. Make sure you: Wash your hands with soap and water for at least 20 seconds before and after you change your bandage (dressing). If soap and water are not available, use hand sanitizer. Change your dressing as told by your health care provider. Leave stitches (sutures), skin glue, or adhesive strips in place. These skin closures may need to stay in place for 2 weeks or longer. If adhesive strip edges start to loosen and curl up, you may trim the loose edges. Do not remove adhesive strips completely unless your health care provider tells you to do that. Check your port insertion site every day for signs of infection. Check for: Redness, swelling, or pain. Fluid or blood. Warmth. Pus or a bad smell. Activity Return to your normal activities as told by your health care provider. Ask your health care provider what activities are safe for you. You may have to avoid lifting. Ask your health care provider how much you can safely lift. General instructions Take over-the-counter and prescription medicines only as told by your health care provider. Do not take baths, swim, or use a hot tub until your health care provider approves. Ask your health care provider if you may take showers. You may only be allowed to take sponge baths. If you were given a sedative during the procedure, it can affect you for several hours. Do not drive or operate machinery until your health care provider says that it is safe. Wear a medical alert bracelet in case of an emergency. This will tell any health care providers that you have a port. Keep all follow-up visits. This is important. Contact a health care provider if: You cannot flush your port with saline as directed, or you cannot draw blood from the port. You have a fever or chills. You have redness, swelling, or pain around your port insertion site. You have fluid or blood coming from your port insertion site. Your port insertion site feels warm  to the touch. You have pus or a bad smell coming from the port insertion site. Get help right away if: You have chest pain or shortness of breath. You have bleeding from your port that you cannot control. These symptoms may be an emergency. Get help right away. Call 911. Do not wait to see if the symptoms will go away. Do not drive yourself to the hospital. Summary Take care of the port as told by your health care provider. Keep the manufacturer's information card with you at all times. Change your dressing as told by your health care provider. Contact a health care provider if you have a fever or chills or if you have redness, swelling, or pain around your port insertion site. Keep all follow-up visits. This information is not intended to replace advice given to you by your health care provider. Make sure you discuss any questions you have with your health care provider. Document Revised: 10/09/2020 Document Reviewed: 10/09/2020 Elsevier Patient Education  [place].

## 2022-01-30 NOTE — Progress Notes (Signed)
1630 Ice pack sent home to apply to right neck and chest as needed for comfort.

## 2022-01-31 ENCOUNTER — Telehealth: Payer: Self-pay | Admitting: *Deleted

## 2022-01-31 NOTE — Telephone Encounter (Signed)
Husband states she had port placed and is having some discomfort. Wants to know what she can take.  RN advised to use tylenol

## 2022-02-04 ENCOUNTER — Telehealth: Payer: Self-pay | Admitting: Medical Oncology

## 2022-02-04 NOTE — Telephone Encounter (Signed)
Reviewed port maintenance with pt -able to shower, do not remove glue or steri strips. Ok to use EMLA cream in two weeks.

## 2022-02-05 ENCOUNTER — Other Ambulatory Visit: Payer: Self-pay

## 2022-02-05 MED FILL — Fosaprepitant Dimeglumine For IV Infusion 150 MG (Base Eq): INTRAVENOUS | Qty: 5 | Status: AC

## 2022-02-05 MED FILL — Dexamethasone Sodium Phosphate Inj 100 MG/10ML: INTRAMUSCULAR | Qty: 1 | Status: AC

## 2022-02-06 ENCOUNTER — Inpatient Hospital Stay: Payer: 59

## 2022-02-06 ENCOUNTER — Other Ambulatory Visit: Payer: Self-pay

## 2022-02-06 ENCOUNTER — Inpatient Hospital Stay: Payer: 59 | Attending: Internal Medicine | Admitting: Internal Medicine

## 2022-02-06 ENCOUNTER — Encounter: Payer: Self-pay | Admitting: Internal Medicine

## 2022-02-06 VITALS — BP 125/88 | HR 80 | Temp 98.0°F | Resp 16

## 2022-02-06 VITALS — BP 139/98 | HR 83 | Temp 98.2°F | Resp 16 | Wt 158.9 lb

## 2022-02-06 DIAGNOSIS — Z5111 Encounter for antineoplastic chemotherapy: Secondary | ICD-10-CM | POA: Diagnosis present

## 2022-02-06 DIAGNOSIS — C3491 Malignant neoplasm of unspecified part of right bronchus or lung: Secondary | ICD-10-CM

## 2022-02-06 DIAGNOSIS — C3490 Malignant neoplasm of unspecified part of unspecified bronchus or lung: Secondary | ICD-10-CM | POA: Diagnosis not present

## 2022-02-06 DIAGNOSIS — C3411 Malignant neoplasm of upper lobe, right bronchus or lung: Secondary | ICD-10-CM | POA: Insufficient documentation

## 2022-02-06 DIAGNOSIS — C778 Secondary and unspecified malignant neoplasm of lymph nodes of multiple regions: Secondary | ICD-10-CM | POA: Insufficient documentation

## 2022-02-06 DIAGNOSIS — Z5112 Encounter for antineoplastic immunotherapy: Secondary | ICD-10-CM | POA: Diagnosis present

## 2022-02-06 DIAGNOSIS — Z79899 Other long term (current) drug therapy: Secondary | ICD-10-CM | POA: Insufficient documentation

## 2022-02-06 DIAGNOSIS — K5900 Constipation, unspecified: Secondary | ICD-10-CM | POA: Insufficient documentation

## 2022-02-06 LAB — CBC WITH DIFFERENTIAL (CANCER CENTER ONLY)
Abs Immature Granulocytes: 0.04 10*3/uL (ref 0.00–0.07)
Basophils Absolute: 0 10*3/uL (ref 0.0–0.1)
Basophils Relative: 0 %
Eosinophils Absolute: 0.1 10*3/uL (ref 0.0–0.5)
Eosinophils Relative: 2 %
HCT: 34.9 % — ABNORMAL LOW (ref 36.0–46.0)
Hemoglobin: 12 g/dL (ref 12.0–15.0)
Immature Granulocytes: 1 %
Lymphocytes Relative: 38 %
Lymphs Abs: 2.2 10*3/uL (ref 0.7–4.0)
MCH: 31.5 pg (ref 26.0–34.0)
MCHC: 34.4 g/dL (ref 30.0–36.0)
MCV: 91.6 fL (ref 80.0–100.0)
Monocytes Absolute: 0.8 10*3/uL (ref 0.1–1.0)
Monocytes Relative: 15 %
Neutro Abs: 2.5 10*3/uL (ref 1.7–7.7)
Neutrophils Relative %: 44 %
Platelet Count: 203 10*3/uL (ref 150–400)
RBC: 3.81 MIL/uL — ABNORMAL LOW (ref 3.87–5.11)
RDW: 17.8 % — ABNORMAL HIGH (ref 11.5–15.5)
WBC Count: 5.6 10*3/uL (ref 4.0–10.5)
nRBC: 0 % (ref 0.0–0.2)

## 2022-02-06 LAB — TSH: TSH: 2.065 u[IU]/mL (ref 0.350–4.500)

## 2022-02-06 LAB — CMP (CANCER CENTER ONLY)
ALT: 46 U/L — ABNORMAL HIGH (ref 0–44)
AST: 31 U/L (ref 15–41)
Albumin: 4.2 g/dL (ref 3.5–5.0)
Alkaline Phosphatase: 75 U/L (ref 38–126)
Anion gap: 7 (ref 5–15)
BUN: 18 mg/dL (ref 8–23)
CO2: 29 mmol/L (ref 22–32)
Calcium: 10 mg/dL (ref 8.9–10.3)
Chloride: 105 mmol/L (ref 98–111)
Creatinine: 0.73 mg/dL (ref 0.44–1.00)
GFR, Estimated: >60 mL/min (ref >60)
Glucose, Bld: 101 mg/dL — ABNORMAL HIGH (ref 70–99)
Potassium: 3.9 mmol/L (ref 3.5–5.1)
Sodium: 141 mmol/L (ref 135–145)
Total Bilirubin: 0.2 mg/dL — ABNORMAL LOW (ref 0.3–1.2)
Total Protein: 7 g/dL (ref 6.5–8.1)

## 2022-02-06 MED ORDER — SODIUM CHLORIDE 0.9 % IV SOLN
150.0000 mg | Freq: Once | INTRAVENOUS | Status: AC
  Administered 2022-02-06: 150 mg via INTRAVENOUS
  Filled 2022-02-06: qty 150

## 2022-02-06 MED ORDER — HEPARIN SOD (PORK) LOCK FLUSH 100 UNIT/ML IV SOLN
500.0000 [IU] | Freq: Once | INTRAVENOUS | Status: AC | PRN
  Administered 2022-02-06: 500 [IU]

## 2022-02-06 MED ORDER — SODIUM CHLORIDE 0.9 % IV SOLN
530.5000 mg | Freq: Once | INTRAVENOUS | Status: AC
  Administered 2022-02-06: 530.5 mg via INTRAVENOUS
  Filled 2022-02-06: qty 53.05

## 2022-02-06 MED ORDER — SODIUM CHLORIDE 0.9 % IV SOLN
200.0000 mg | Freq: Once | INTRAVENOUS | Status: AC
  Administered 2022-02-06: 200 mg via INTRAVENOUS
  Filled 2022-02-06: qty 200

## 2022-02-06 MED ORDER — SODIUM CHLORIDE 0.9 % IV SOLN
500.0000 mg/m2 | Freq: Once | INTRAVENOUS | Status: AC
  Administered 2022-02-06: 900 mg via INTRAVENOUS
  Filled 2022-02-06: qty 20

## 2022-02-06 MED ORDER — SODIUM CHLORIDE 0.9 % IV SOLN
10.0000 mg | Freq: Once | INTRAVENOUS | Status: AC
  Administered 2022-02-06: 10 mg via INTRAVENOUS
  Filled 2022-02-06: qty 10

## 2022-02-06 MED ORDER — PALONOSETRON HCL INJECTION 0.25 MG/5ML
0.2500 mg | Freq: Once | INTRAVENOUS | Status: AC
  Administered 2022-02-06: 0.25 mg via INTRAVENOUS
  Filled 2022-02-06: qty 5

## 2022-02-06 MED ORDER — SODIUM CHLORIDE 0.9% FLUSH
10.0000 mL | INTRAVENOUS | Status: DC | PRN
  Administered 2022-02-06: 10 mL

## 2022-02-06 MED ORDER — SODIUM CHLORIDE 0.9 % IV SOLN: Freq: Once | INTRAVENOUS | Status: AC

## 2022-02-06 MED ORDER — CYANOCOBALAMIN 1000 MCG/ML IJ SOLN
1000.0000 ug | Freq: Once | INTRAMUSCULAR | Status: AC
  Administered 2022-02-06: 1000 ug via INTRAMUSCULAR
  Filled 2022-02-06: qty 1

## 2022-02-06 NOTE — Patient Instructions (Signed)
[facility] ONCOLOG[facility]  Discharge Instructions: Thank you for choosing [facility] to provide your oncology and hematology care.   If you have a lab appointment with the [facility], please go directly to the [facility] and check in at the registration area.   Wear comfortable clothing and clothing appropriate for easy access to any Portacath or PICC line.   We strive to give you quality time with your provider. You may need to reschedule your appointment if you arrive late (15 or more minutes).  Arriving late affects you and other patients whose appointments are after yours.  Also, if you miss three or more appointments without notifying the office, you may be dismissed from the clinic at the provider's discretion.      For prescription refill requests, have your pharmacy contact our office and allow 72 hours for refills to be completed.    Today you received the following chemotherapy and/or immunotherapy agents: Keytruda/Alimta/Carboplatin      To help prevent nausea and vomiting after your treatment, we encourage you to take your nausea medication as directed.  BELOW ARE SYMPTOMS THAT SHOULD BE REPORTED IMMEDIATELY: *FEVER GREATER THAN 100.4 F (38 C) OR HIGHER *CHILLS OR SWEATING *NAUSEA AND VOMITING THAT IS NOT CONTROLLED WITH YOUR NAUSEA MEDICATION *UNUSUAL SHORTNESS OF BREATH *UNUSUAL BRUISING OR BLEEDING *URINARY PROBLEMS (pain or burning when urinating, or frequent urination) *BOWEL PROBLEMS (unusual diarrhea, constipation, pain near the anus) TENDERNESS IN MOUTH AND THROAT WITH OR WITHOUT PRESENCE OF ULCERS (sore throat, sores in mouth, or a toothache) UNUSUAL RASH, SWELLING OR PAIN  UNUSUAL VAGINAL DISCHARGE OR ITCHING   Items with * indicate a potential emergency and should be followed up as soon as possible or go to the Emergency Department if any problems should occur.  Please show the CHEMOTHERAPY ALERT CARD or IMMUNOTHERAPY ALERT  CARD at check-in to the Emergency Department and triage nurse.  Should you have questions after your visit or need to cancel or reschedule your appointment, please contact [facility]  Dept: [phone]  and follow the prompts.  Office hours are 8:00 a.m. to 4:30 p.m. Monday - Friday. Please note that voicemails left after 4:00 p.m. may not be returned until the following business day.  We are closed weekends and major holidays. You have access to a nurse at all times for urgent questions. Please call the main number to the clinic Dept: [phone] and follow the prompts.   For any non-urgent questions, you may also contact your provider using MyChart. We now offer e-Visits for anyone 64 and older to request care online for non-urgent symptoms. For details visit [email or web address].   Also download the MyChart app! Go to the app store, search "MyChart", open the app, select [facility], and log in with your MyChart username and password.  Masks are optional in the cancer centers. If you would like for your care team to wear a mask while they are taking care of you, please let them know. You may have one support person who is at least 65 years old accompany you for your appointments. Implanted [facility] Guide An implanted port is a device that is placed under the skin. It is usually placed in the chest. The device may vary based on the need. Implanted ports can be used to give IV medicine, to take blood, or to give fluids. You may have an implanted port if: You need IV medicine that would be irritating  to the small veins in your hands or arms. You need IV medicines, such as chemotherapy, for a long period of time. You need IV nutrition for a long period of time. You may have fewer limitations when using a port than you would if you used other types of long-term IVs. You will also likely be able to return to normal activities after your incision heals. An implanted  port has two main parts: Reservoir. The reservoir is the part where a needle is inserted to give medicines or draw blood. The reservoir is round. After the port is placed, it appears as a small, raised area under your skin. Catheter. The catheter is a small, thin tube that connects the reservoir to a vein. Medicine that is inserted into the reservoir goes into the catheter and then into the vein. How is my port accessed? To access your port: A numbing cream may be placed on the skin over the port site. Your health care provider will put on a mask and sterile gloves. The skin over your port will be cleaned carefully with a germ-killing soap and allowed to dry. Your health care provider will gently pinch the port and insert a needle into it. Your health care provider will check for a blood return to make sure the port is in the vein and is still working (patent). If your port needs to remain accessed to get medicine continuously (constant infusion), your health care provider will place a clear bandage (dressing) over the needle site. The dressing and needle will need to be changed every week, or as told by your health care provider. What is flushing? Flushing helps keep the port working. Follow instructions from your health care provider about how and when to flush the port. Ports are usually flushed with saline solution or a medicine called heparin. The need for flushing will depend on how the port is used: If the port is only used from time to time to give medicines or draw blood, the port may need to be flushed: Before and after medicines have been given. Before and after blood has been drawn. As part of routine maintenance. Flushing may be recommended every 4-6 weeks. If a constant infusion is running, the port may not need to be flushed. Throw away any syringes in a disposal container that is meant for sharp items (sharps container). You can buy a sharps container from a pharmacy, or you can  make one by using an empty hard plastic bottle with a cover. How long will my port stay implanted? The port can stay in for as long as your health care provider thinks it is needed. When it is time for the port to come out, a surgery will be done to remove it. The surgery will be similar to the procedure that was done to put the port in. Follow these instructions at home: Caring for your port and port site Flush your port as told by your health care provider. If you need an infusion over several days, follow instructions from your health care provider about how to take care of your port site. Make sure you: Change your dressing as told by your health care provider. Wash your hands with soap and water for at least 20 seconds before and after you change your dressing. If soap and water are not available, use alcohol-based hand sanitizer. Place any used dressings or infusion bags into a plastic bag. Throw that bag in the trash. Keep the dressing that covers the  needle clean and dry. Do not get it wet. Do not use scissors or sharp objects near the infusion tubing. Keep any external tubes clamped, unless they are being used. Check your port site every day for signs of infection. Check for: Redness, swelling, or pain. Fluid or blood. Warmth. Pus or a bad smell. Protect the skin around the port site. Avoid wearing bra straps that rub or irritate the site. Protect the skin around your port from seat belts. Place a soft pad over your chest if needed. Bathe or shower as told by your health care provider. The site may get wet as long as you are not actively receiving an infusion. General instructions  Return to your normal activities as told by your health care provider. Ask your health care provider what activities are safe for you. Carry a medical alert card or wear a medical alert bracelet at all times. This will let health care providers know that you have an implanted port in case of an  emergency. Where to find more information American Cancer Society: www.cancer.[place] of Clinical Oncology: www.cancer.net Contact a health care provider if: You have a fever or chills. You have redness, swelling, or pain at the port site. You have fluid or blood coming from your port site. Your incision feels warm to the touch. You have pus or a bad smell coming from the port site. Summary Implanted ports are usually placed in the chest for long-term IV access. Follow instructions from your health care provider about flushing the port and changing bandages (dressings). Take care of the area around your port by avoiding clothing that puts pressure on the area, and by watching for signs of infection. Protect the skin around your port from seat belts. Place a soft pad over your chest if needed. Contact a health care provider if you have a fever or you have redness, swelling, pain, fluid, or a bad smell at the port site. This information is not intended to replace advice given to you by your health care provider. Make sure you discuss any questions you have with your health care provider. Document Revised: 10/09/2020 Document Reviewed: 10/09/2020 Elsevier Patient Education  [place].
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
## 2022-02-07 LAB — T4: T4, Total: 8 ug/dL (ref 4.5–12.0)

## 2022-02-17 NOTE — Addendum Note (Signed)
Encounter addended by: Sheralyn Boatman, RN on: 02/17/2022 6:33 AM  Actions taken: Charge Capture section accepted

## 2022-02-18 ENCOUNTER — Other Ambulatory Visit: Payer: Self-pay

## 2022-02-18 ENCOUNTER — Encounter (HOSPITAL_COMMUNITY): Payer: Self-pay | Admitting: Emergency Medicine

## 2022-02-18 ENCOUNTER — Emergency Department (HOSPITAL_COMMUNITY)
Admission: EM | Admit: 2022-02-18 | Discharge: 2022-02-19 | Disposition: A | Discharge status: Home / Self Care | Payer: 59 | Attending: Emergency Medicine | Admitting: Emergency Medicine

## 2022-02-18 ENCOUNTER — Emergency Department (HOSPITAL_COMMUNITY): Payer: 59

## 2022-02-18 DIAGNOSIS — Z85118 Personal history of other malignant neoplasm of bronchus and lung: Secondary | ICD-10-CM | POA: Insufficient documentation

## 2022-02-18 DIAGNOSIS — R0789 Other chest pain: Secondary | ICD-10-CM | POA: Insufficient documentation

## 2022-02-18 DIAGNOSIS — R079 Chest pain, unspecified: Secondary | ICD-10-CM | POA: Diagnosis present

## 2022-02-18 LAB — BASIC METABOLIC PANEL
Anion gap: 7 (ref 5–15)
BUN: 17 mg/dL (ref 8–23)
CO2: 28 mmol/L (ref 22–32)
Calcium: 9.5 mg/dL (ref 8.9–10.3)
Chloride: 103 mmol/L (ref 98–111)
Creatinine, Ser: 0.82 mg/dL (ref 0.44–1.00)
GFR, Estimated: >60 mL/min (ref >60)
Glucose, Bld: 113 mg/dL — ABNORMAL HIGH (ref 70–99)
Potassium: 3.4 mmol/L — ABNORMAL LOW (ref 3.5–5.1)
Sodium: 138 mmol/L (ref 135–145)

## 2022-02-18 MED ORDER — IOHEXOL 300 MG/ML  SOLN
75.0000 mL | Freq: Once | INTRAMUSCULAR | Status: AC | PRN
  Administered 2022-02-18: 75 mL via INTRAVENOUS

## 2022-02-18 NOTE — Discharge Instructions (Addendum)
Contact Dr. Lew Dawes office tomorrow and let them know that you you are here for the chest discomfort and that we found that your platelets were low.  They can look at the lab results

## 2022-02-18 NOTE — ED Triage Notes (Signed)
Pt arrives POV with friend. Pt concerned due to having pain around chest port placement. Pt states pain radiates up into her neck depending on her movement.

## 2022-02-18 NOTE — ED Provider Notes (Signed)
[facility] EMERGENCY DEPARTMENT Provider Note   CSN: [id number] Arrival date & time: 02/18/22  1951     History {Add pertinent medical, surgical, social history, OB history to HPI:1} Chief Complaint  Patient presents with   Post-op Problem    Yvette Curry is a 66 y.o. female.  Patient has a history of lung cancer.  She had a Port-A-Cath placed 9 days ago.  The last 3 to 4 days she has been having pain in her chest rating up to her neck and her chest is very tender  The history is provided by the patient and medical records. No language interpreter was used.  Chest Pain Pain location:  L lateral chest Pain quality: aching   Pain radiates to:  Neck Pain severity:  Mild Onset quality:  Sudden Timing:  Constant Progression:  Worsening Chronicity:  New Context: not breathing   Relieved by:  Nothing Worsened by:  Nothing Associated symptoms: no abdominal pain, no back pain, no cough, no fatigue and no headache        Home Medications Prior to Admission medications   Medication Sig Start Date End Date Taking? Authorizing Provider  Chlorpheniramine Maleate (ALLERGY PO) Take 1 tablet by mouth daily as needed (sinus headaches).   Yes [provider]  ELDERBERRY PO Take 5 mLs by mouth every other day.   Yes [provider]  folic acid (FOLVITE) 1 MG tablet Take 1 tablet (1 mg total) by mouth daily. 12/12/21  Yes Curt Bears, MD  lidocaine-prilocaine (EMLA) cream Apply to the Port-A-Cath site 30-60 minutes before chemotherapy. 01/15/22  Yes Curt Bears, MD  nicotine (NICODERM CQ) 21 mg/24hr patch Place 1 patch (21 mg total) onto the skin daily. 11/26/21  Yes Curt Bears, MD  OVER THE COUNTER MEDICATION Take 1 Dose by mouth daily. Chestal  honey   Yes [provider]  OVER THE COUNTER MEDICATION Take 1 Dose by mouth daily. "EMMA" purchased online-Multivitamin   Yes [provider]  OVER THE COUNTER MEDICATION Take 3,000 mg by mouth  daily. Liposomal vitamin C3000mg /5 ml   Yes [provider]  pantoprazole (PROTONIX) 40 MG tablet TAKE 1 TABLET BY MOUTH DAILY BEFORE BREAKFAST 12/24/21  Yes Carlan, Chelsea L, NP  prochlorperazine (COMPAZINE) 10 MG tablet Take 1 tablet (10 mg total) by mouth every 6 (six) hours as needed for nausea or vomiting. 12/12/21  Yes Curt Bears, MD  Propylene Glycol (SYSTANE BALANCE) 0.6 % SOLN Place 1 drop into both eyes daily as needed (dry eyes).   Yes [provider]  Cholecalciferol (DIALYVITE VITAMIN D 5000) 125 MCG (5000 UT) capsule Take 5,000 Units by mouth daily. Patient not taking: Reported on 02/18/2022    [provider]  Probiotic Product (PROBIOTIC PO) Take 1 capsule by mouth daily. Patient not taking: Reported on 02/18/2022    [provider]  sertraline (ZOLOFT) 50 MG tablet Take 50 mg by mouth at bedtime. Patient not taking: Reported on 12/25/2021    [provider]      Allergies    Lidocaine, Demerol, and Sulfa antibiotics    Review of Systems   Review of Systems  Constitutional:  Negative for appetite change and fatigue.  HENT:  Negative for congestion, ear discharge and sinus pressure.   Eyes:  Negative for discharge.  Respiratory:  Negative for cough.   Cardiovascular:  Positive for chest pain.  Gastrointestinal:  Negative for abdominal pain and diarrhea.  Genitourinary:  Negative for frequency and hematuria.  Musculoskeletal:  Negative for back pain.  Skin:  Negative for rash.  Neurological:  Negative for seizures and headaches.  Psychiatric/Behavioral:  Negative for hallucinations.     Physical Exam Updated Vital Signs BP 117/87   Pulse 87   Temp 98.7 F (37.1 C) (Oral)   Resp 18   Ht 5' (1.524 m)   Wt 72.1 kg   SpO2 98%   BMI 31.03 kg/m  Physical Exam Vitals and nursing note reviewed.  Constitutional:      Appearance: She is well-developed.  HENT:     Head: Normocephalic.     Nose: Nose normal.  Eyes:      General: No scleral icterus.    Conjunctiva/sclera: Conjunctivae normal.  Neck:     Thyroid: No thyromegaly.  Cardiovascular:     Rate and Rhythm: Normal rate and regular rhythm.     Heart sounds: No murmur heard.    No friction rub. No gallop.  Pulmonary:     Breath sounds: No stridor. No wheezing or rales.  Chest:     Chest wall: Tenderness present.  Abdominal:     General: There is no distension.     Tenderness: There is no abdominal tenderness. There is no rebound.  Musculoskeletal:        General: Normal range of motion.     Cervical back: Neck supple.  Lymphadenopathy:     Cervical: No cervical adenopathy.  Skin:    Findings: No erythema or rash.  Neurological:     Mental Status: She is alert and oriented to person, place, and time.     Motor: No abnormal muscle tone.     Coordination: Coordination normal.  Psychiatric:        Behavior: Behavior normal.     ED Results / Procedures / Treatments   Labs (all labs ordered are listed, but only abnormal results are displayed) Labs Reviewed  CBC WITH DIFFERENTIAL/PLATELET - Abnormal; Notable for the following components:      Result Value   WBC 3.5 (*)    RBC 3.33 (*)    Hemoglobin 10.6 (*)    HCT 30.8 (*)    RDW 17.2 (*)    Platelets 45 (*)    Neutro Abs 1.2 (*)    All other components within normal limits  BASIC METABOLIC PANEL - Abnormal; Notable for the following components:   Potassium 3.4 (*)    Glucose, Bld 113 (*)    All other components within normal limits    EKG None  Radiology DG Chest 2 View  Result Date: 02/18/2022 CLINICAL DATA:  Chest pain. EXAM: CHEST - 2 VIEW COMPARISON:  Chest CT dated 11/18/2021. FINDINGS: Right-sided Port-A-Cath with tip over central SVC. No focal consolidation, pleural effusion, or pneumothorax. The previously seen right upper lobe nodule is not identified with certainty on this radiograph. The cardiac silhouette is within limits. No acute osseous pathology. IMPRESSION:  No active cardiopulmonary disease. Electronically Signed   By: Anner Crete M.D.   On: 02/18/2022 20:45    Procedures Procedures  {Document cardiac monitor, telemetry assessment procedure when appropriate:1}  Medications Ordered in ED Medications  iohexol (OMNIPAQUE) 300 MG/ML solution 75 mL (75 mLs Intravenous Contrast Given 02/18/22 2301)    ED Course/ Medical Decision Making/ A&P  Patient with tenderness over Port-A-Cath and tenderness to her neck on the side of the Port-A-Cath.  Rest of skin exam appeared normal.  Chest x-ray was negative.  Medical Decision Making Amount and/or Complexity of Data Reviewed Labs: ordered. Radiology: ordered.  Risk Prescription drug management.   Chest pain related to her Port-A-Cath..  Chest x-ray unremarkable  {Document critical care time when appropriate:1} {Document review of labs and clinical decision tools ie heart score, Chads2Vasc2 etc:1}  {Document your independent review of radiology images, and any outside records:1} {Document your discussion with family members, caretakers, and with consultants:1} {Document social determinants of health affecting pt's care:1} {Document your decision making why or why not admission, treatments were needed:1} Final Clinical Impression(s) / ED Diagnoses Final diagnoses:  None    Rx / DC Orders ED Discharge Orders     None

## 2022-02-18 NOTE — ED Notes (Signed)
Patient transported to CT 

## 2022-02-18 NOTE — ED Provider Notes (Signed)
  Physical Exam  BP 109/75   Pulse 90   Temp 98.7 F (37.1 C) (Oral)   Resp 18   Ht 5' (1.524 m)   Wt 72.1 kg   SpO2 97%   BMI 31.03 kg/m   Physical Exam Vitals and nursing note reviewed.  Constitutional:      General: She is not in acute distress.    Appearance: She is well-developed. She is not diaphoretic.  HENT:     Head: Normocephalic and atraumatic.  Cardiovascular:     Rate and Rhythm: Normal rate and regular rhythm.     Heart sounds: No murmur heard.    No friction rub. No gallop.  Pulmonary:     Effort: Pulmonary effort is normal. No respiratory distress.     Breath sounds: Normal breath sounds. No wheezing.  Abdominal:     General: Bowel sounds are normal. There is no distension.     Palpations: Abdomen is soft.     Tenderness: There is no abdominal tenderness.  Musculoskeletal:        General: Normal range of motion.     Cervical back: Normal range of motion and neck supple.  Skin:    General: Skin is warm and dry.  Neurological:     General: No focal deficit present.     Mental Status: She is alert and oriented to person, place, and time.     Procedures  Procedures  ED Course / MDM  Care assumed from Dr. Roderic Palau at shift change.  Patient with history of lung cancer status post Port-A-Cath placement 2-1/2 weeks ago.  She presents here with pain in the area of the Port-A-Cath.  She denies any specific injury or trauma, but pain seemed to begin after reaching into the washing machine to pull out laundry.  Care was signed out to me awaiting results of CT scan to further evaluate for Port-A-Cath complication or other issue.  This study has resulted and is unremarkable with the exception of the existing lung mass.  Patient seems to be feeling better and I believe can safely be discharged.  She is to follow-up with her oncologist and return as needed.       Veryl Speak, MD 02/18/22 2355

## 2022-02-18 NOTE — ED Notes (Signed)
Patient transported to X-ray 

## 2022-02-19 ENCOUNTER — Telehealth: Payer: Self-pay | Admitting: *Deleted

## 2022-02-19 ENCOUNTER — Other Ambulatory Visit: Payer: Self-pay | Admitting: *Deleted

## 2022-02-19 NOTE — Telephone Encounter (Signed)
Pt states she went to the ED last night due to pain around her port. Had labs, CT and CXR. ED told her to be in touch with oncologist regarding platelets. Dr Julien Nordmann says that her counts are as expected.

## 2022-02-19 NOTE — Telephone Encounter (Signed)
Already addressed

## 2022-02-20 LAB — CBC WITH DIFFERENTIAL/PLATELET
Abs Immature Granulocytes: 0.01 10*3/uL (ref 0.00–0.07)
Basophils Absolute: 0 10*3/uL (ref 0.0–0.1)
Basophils Relative: 0 %
Eosinophils Absolute: 0 10*3/uL (ref 0.0–0.5)
Eosinophils Relative: 0 %
HCT: 30.8 % — ABNORMAL LOW (ref 36.0–46.0)
Hemoglobin: 10.6 g/dL — ABNORMAL LOW (ref 12.0–15.0)
Immature Granulocytes: 0 %
Lymphocytes Relative: 51 %
Lymphs Abs: 1.8 10*3/uL (ref 0.7–4.0)
MCH: 31.8 pg (ref 26.0–34.0)
MCHC: 34.4 g/dL (ref 30.0–36.0)
MCV: 92.5 fL (ref 80.0–100.0)
Monocytes Absolute: 0.5 10*3/uL (ref 0.1–1.0)
Monocytes Relative: 15 %
Neutro Abs: 1.2 10*3/uL — ABNORMAL LOW (ref 1.7–7.7)
Neutrophils Relative %: 34 %
Platelets: 45 10*3/uL — ABNORMAL LOW (ref 150–400)
RBC: 3.33 MIL/uL — ABNORMAL LOW (ref 3.87–5.11)
RDW: 17.2 % — ABNORMAL HIGH (ref 11.5–15.5)
WBC: 3.5 10*3/uL — ABNORMAL LOW (ref 4.0–10.5)
nRBC: 0 % (ref 0.0–0.2)

## 2022-02-24 ENCOUNTER — Encounter: Payer: Self-pay | Admitting: Internal Medicine

## 2022-02-25 ENCOUNTER — Encounter: Payer: Self-pay | Admitting: Internal Medicine

## 2022-02-25 ENCOUNTER — Encounter (HOSPITAL_COMMUNITY): Payer: Self-pay

## 2022-02-25 ENCOUNTER — Ambulatory Visit (HOSPITAL_COMMUNITY)
Admission: RE | Admit: 2022-02-25 | Discharge: 2022-02-25 | Disposition: A | Discharge status: Home / Self Care | Payer: Medicare Other | Source: Ambulatory Visit | Attending: Internal Medicine | Admitting: Internal Medicine

## 2022-02-25 DIAGNOSIS — C3490 Malignant neoplasm of unspecified part of unspecified bronchus or lung: Secondary | ICD-10-CM | POA: Diagnosis present

## 2022-02-25 MED ORDER — HEPARIN SOD (PORK) LOCK FLUSH 100 UNIT/ML IV SOLN
INTRAVENOUS | Status: AC
  Filled 2022-02-25: qty 5

## 2022-02-25 MED ORDER — IOHEXOL 300 MG/ML  SOLN
100.0000 mL | Freq: Once | INTRAMUSCULAR | Status: AC | PRN
  Administered 2022-02-25: 100 mL via INTRAVENOUS

## 2022-02-25 MED ORDER — HEPARIN SOD (PORK) LOCK FLUSH 100 UNIT/ML IV SOLN
500.0000 [IU] | Freq: Once | INTRAVENOUS | Status: AC
  Administered 2022-02-25: 500 [IU] via INTRAVENOUS

## 2022-02-25 MED ORDER — SODIUM CHLORIDE (PF) 0.9 % IJ SOLN
INTRAMUSCULAR | Status: AC
  Filled 2022-02-25: qty 50

## 2022-02-26 MED FILL — Fosaprepitant Dimeglumine For IV Infusion 150 MG (Base Eq): INTRAVENOUS | Qty: 5 | Status: AC

## 2022-02-26 MED FILL — Dexamethasone Sodium Phosphate Inj 100 MG/10ML: INTRAMUSCULAR | Qty: 1 | Status: AC

## 2022-02-27 ENCOUNTER — Inpatient Hospital Stay (HOSPITAL_BASED_OUTPATIENT_CLINIC_OR_DEPARTMENT_OTHER): Payer: Medicare Other | Admitting: Internal Medicine

## 2022-02-27 ENCOUNTER — Inpatient Hospital Stay: Payer: Medicare Other | Attending: Internal Medicine

## 2022-02-27 ENCOUNTER — Other Ambulatory Visit: Payer: 59

## 2022-02-27 ENCOUNTER — Inpatient Hospital Stay: Payer: Medicare Other

## 2022-02-27 ENCOUNTER — Other Ambulatory Visit: Payer: Self-pay

## 2022-02-27 VITALS — BP 143/95 | HR 85 | Temp 98.7°F | Resp 17 | Wt 160.5 lb

## 2022-02-27 DIAGNOSIS — C778 Secondary and unspecified malignant neoplasm of lymph nodes of multiple regions: Secondary | ICD-10-CM | POA: Diagnosis not present

## 2022-02-27 DIAGNOSIS — C781 Secondary malignant neoplasm of mediastinum: Secondary | ICD-10-CM | POA: Insufficient documentation

## 2022-02-27 DIAGNOSIS — Z5112 Encounter for antineoplastic immunotherapy: Secondary | ICD-10-CM | POA: Insufficient documentation

## 2022-02-27 DIAGNOSIS — C786 Secondary malignant neoplasm of retroperitoneum and peritoneum: Secondary | ICD-10-CM | POA: Insufficient documentation

## 2022-02-27 DIAGNOSIS — C3491 Malignant neoplasm of unspecified part of right bronchus or lung: Secondary | ICD-10-CM

## 2022-02-27 DIAGNOSIS — K5900 Constipation, unspecified: Secondary | ICD-10-CM | POA: Diagnosis not present

## 2022-02-27 DIAGNOSIS — Z5111 Encounter for antineoplastic chemotherapy: Secondary | ICD-10-CM | POA: Diagnosis present

## 2022-02-27 DIAGNOSIS — Z79899 Other long term (current) drug therapy: Secondary | ICD-10-CM | POA: Insufficient documentation

## 2022-02-27 DIAGNOSIS — C3411 Malignant neoplasm of upper lobe, right bronchus or lung: Secondary | ICD-10-CM | POA: Diagnosis present

## 2022-02-27 LAB — CMP (CANCER CENTER ONLY)
ALT: 20 U/L (ref 0–44)
AST: 19 U/L (ref 15–41)
Albumin: 4.1 g/dL (ref 3.5–5.0)
Alkaline Phosphatase: 65 U/L (ref 38–126)
Anion gap: 5 (ref 5–15)
BUN: 10 mg/dL (ref 8–23)
CO2: 29 mmol/L (ref 22–32)
Calcium: 9.7 mg/dL (ref 8.9–10.3)
Chloride: 108 mmol/L (ref 98–111)
Creatinine: 0.76 mg/dL (ref 0.44–1.00)
GFR, Estimated: >60 mL/min (ref >60)
Glucose, Bld: 97 mg/dL (ref 70–99)
Potassium: 4 mmol/L (ref 3.5–5.1)
Sodium: 142 mmol/L (ref 135–145)
Total Bilirubin: 0.2 mg/dL — ABNORMAL LOW (ref 0.3–1.2)
Total Protein: 7 g/dL (ref 6.5–8.1)

## 2022-02-27 LAB — CBC WITH DIFFERENTIAL (CANCER CENTER ONLY)
Abs Immature Granulocytes: 0.02 10*3/uL (ref 0.00–0.07)
Basophils Absolute: 0 10*3/uL (ref 0.0–0.1)
Basophils Relative: 1 %
Eosinophils Absolute: 0.1 10*3/uL (ref 0.0–0.5)
Eosinophils Relative: 1 %
HCT: 30 % — ABNORMAL LOW (ref 36.0–46.0)
Hemoglobin: 10.3 g/dL — ABNORMAL LOW (ref 12.0–15.0)
Immature Granulocytes: 1 %
Lymphocytes Relative: 40 %
Lymphs Abs: 1.7 10*3/uL (ref 0.7–4.0)
MCH: 32.7 pg (ref 26.0–34.0)
MCHC: 34.3 g/dL (ref 30.0–36.0)
MCV: 95.2 fL (ref 80.0–100.0)
Monocytes Absolute: 0.7 10*3/uL (ref 0.1–1.0)
Monocytes Relative: 17 %
Neutro Abs: 1.8 10*3/uL (ref 1.7–7.7)
Neutrophils Relative %: 40 %
Platelet Count: 146 10*3/uL — ABNORMAL LOW (ref 150–400)
RBC: 3.15 MIL/uL — ABNORMAL LOW (ref 3.87–5.11)
RDW: 20 % — ABNORMAL HIGH (ref 11.5–15.5)
WBC Count: 4.3 10*3/uL (ref 4.0–10.5)
nRBC: 0 % (ref 0.0–0.2)

## 2022-02-27 MED ORDER — SODIUM CHLORIDE 0.9% FLUSH
10.0000 mL | INTRAVENOUS | Status: DC | PRN
  Administered 2022-02-27: 10 mL

## 2022-02-27 MED ORDER — SODIUM CHLORIDE 0.9 % IV SOLN
200.0000 mg | Freq: Once | INTRAVENOUS | Status: AC
  Administered 2022-02-27: 200 mg via INTRAVENOUS
  Filled 2022-02-27: qty 200

## 2022-02-27 MED ORDER — SODIUM CHLORIDE 0.9 % IV SOLN
450.0000 mg | Freq: Once | INTRAVENOUS | Status: AC
  Administered 2022-02-27: 450 mg via INTRAVENOUS
  Filled 2022-02-27: qty 45

## 2022-02-27 MED ORDER — SODIUM CHLORIDE 0.9 % IV SOLN
500.0000 mg/m2 | Freq: Once | INTRAVENOUS | Status: AC
  Administered 2022-02-27: 900 mg via INTRAVENOUS
  Filled 2022-02-27: qty 20

## 2022-02-27 MED ORDER — SODIUM CHLORIDE 0.9 % IV SOLN: Freq: Once | INTRAVENOUS | Status: AC

## 2022-02-27 MED ORDER — SODIUM CHLORIDE 0.9% FLUSH
10.0000 mL | INTRAVENOUS | Status: DC | PRN
  Administered 2022-02-27: 10 mL via INTRAVENOUS

## 2022-02-27 MED ORDER — PALONOSETRON HCL INJECTION 0.25 MG/5ML
0.2500 mg | Freq: Once | INTRAVENOUS | Status: AC
  Administered 2022-02-27: 0.25 mg via INTRAVENOUS
  Filled 2022-02-27: qty 5

## 2022-02-27 MED ORDER — SODIUM CHLORIDE 0.9 % IV SOLN
150.0000 mg | Freq: Once | INTRAVENOUS | Status: AC
  Administered 2022-02-27: 150 mg via INTRAVENOUS
  Filled 2022-02-27: qty 150

## 2022-02-27 MED ORDER — HEPARIN SOD (PORK) LOCK FLUSH 100 UNIT/ML IV SOLN
500.0000 [IU] | Freq: Once | INTRAVENOUS | Status: AC | PRN
  Administered 2022-02-27: 500 [IU]

## 2022-02-27 MED ORDER — SODIUM CHLORIDE 0.9 % IV SOLN
10.0000 mg | Freq: Once | INTRAVENOUS | Status: AC
  Administered 2022-02-27: 10 mg via INTRAVENOUS
  Filled 2022-02-27: qty 10
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
## 2022-02-27 NOTE — Patient Instructions (Signed)
[facility]  Discharge Instructions: Thank you for choosing [facility] to provide your oncology and hematology care.   If you have a lab appointment with the [facility], please go directly to the [facility] and check in at the registration area.   Wear comfortable clothing and clothing appropriate for easy access to any Portacath or PICC line.   We strive to give you quality time with your provider. You may need to reschedule your appointment if you arrive late (15 or more minutes).  Arriving late affects you and other patients whose appointments are after yours.  Also, if you miss three or more appointments without notifying the office, you may be dismissed from the clinic at the provider's discretion.      For prescription refill requests, have your pharmacy contact our office and allow 72 hours for refills to be completed.    Today you received the following chemotherapy and/or immunotherapy agents: Keytruda/Alimta/Carboplatin      To help prevent nausea and vomiting after your treatment, we encourage you to take your nausea medication as directed.  BELOW ARE SYMPTOMS THAT SHOULD BE REPORTED IMMEDIATELY: *FEVER GREATER THAN 100.4 F (38 C) OR HIGHER *CHILLS OR SWEATING *NAUSEA AND VOMITING THAT IS NOT CONTROLLED WITH YOUR NAUSEA MEDICATION *UNUSUAL SHORTNESS OF BREATH *UNUSUAL BRUISING OR BLEEDING *URINARY PROBLEMS (pain or burning when urinating, or frequent urination) *BOWEL PROBLEMS (unusual diarrhea, constipation, pain near the anus) TENDERNESS IN MOUTH AND THROAT WITH OR WITHOUT PRESENCE OF ULCERS (sore throat, sores in mouth, or a toothache) UNUSUAL RASH, SWELLING OR PAIN  UNUSUAL VAGINAL DISCHARGE OR ITCHING   Items with * indicate a potential emergency and should be followed up as soon as possible or go to the Emergency Department if any problems should occur.  Please show the CHEMOTHERAPY ALERT CARD or IMMUNOTHERAPY ALERT  CARD at check-in to the Emergency Department and triage nurse.  Should you have questions after your visit or need to cancel or reschedule your appointment, please contact [facility]  Dept: [phone]  and follow the prompts.  Office hours are 8:00 a.m. to 4:30 p.m. Monday - Friday. Please note that voicemails left after 4:00 p.m. may not be returned until the following business day.  We are closed weekends and major holidays. You have access to a nurse at all times for urgent questions. Please call the main number to the clinic Dept: [phone] and follow the prompts.   For any non-urgent questions, you may also contact your provider using MyChart. We now offer e-Visits for anyone 1 and older to request care online for non-urgent symptoms. For details visit [email or web address].   Also download the MyChart app! Go to the app store, search "MyChart", open the app, select [facility], and log in with your MyChart username and password.  Masks are optional in the cancer centers. If you would like for your care team to wear a mask while they are taking care of you, please let them know. You may have one support Taisha Pennebaker who is at least 65 years old accompany you for your appointments. Implanted [facility] Guide An implanted port is a device that is placed under the skin. It is usually placed in the chest. The device may vary based on the need. Implanted ports can be used to give IV medicine, to take blood, or to give fluids. You may have an implanted port if: You need IV medicine that would be irritating  to the small veins in your hands or arms. You need IV medicines, such as chemotherapy, for a long period of time. You need IV nutrition for a long period of time. You may have fewer limitations when using a port than you would if you used other types of long-term IVs. You will also likely be able to return to normal activities after your incision heals. An implanted  port has two main parts: Reservoir. The reservoir is the part where a needle is inserted to give medicines or draw blood. The reservoir is round. After the port is placed, it appears as a small, raised area under your skin. Catheter. The catheter is a small, thin tube that connects the reservoir to a vein. Medicine that is inserted into the reservoir goes into the catheter and then into the vein. How is my port accessed? To access your port: A numbing cream may be placed on the skin over the port site. Your health care provider will put on a mask and sterile gloves. The skin over your port will be cleaned carefully with a germ-killing soap and allowed to dry. Your health care provider will gently pinch the port and insert a needle into it. Your health care provider will check for a blood return to make sure the port is in the vein and is still working (patent). If your port needs to remain accessed to get medicine continuously (constant infusion), your health care provider will place a clear bandage (dressing) over the needle site. The dressing and needle will need to be changed every week, or as told by your health care provider. What is flushing? Flushing helps keep the port working. Follow instructions from your health care provider about how and when to flush the port. Ports are usually flushed with saline solution or a medicine called heparin. The need for flushing will depend on how the port is used: If the port is only used from time to time to give medicines or draw blood, the port may need to be flushed: Before and after medicines have been given. Before and after blood has been drawn. As part of routine maintenance. Flushing may be recommended every 4-6 weeks. If a constant infusion is running, the port may not need to be flushed. Throw away any syringes in a disposal container that is meant for sharp items (sharps container). You can buy a sharps container from a pharmacy, or you can  make one by using an empty hard plastic bottle with a cover. How long will my port stay implanted? The port can stay in for as long as your health care provider thinks it is needed. When it is time for the port to come out, a surgery will be done to remove it. The surgery will be similar to the procedure that was done to put the port in. Follow these instructions at home: Caring for your port and port site Flush your port as told by your health care provider. If you need an infusion over several days, follow instructions from your health care provider about how to take care of your port site. Make sure you: Change your dressing as told by your health care provider. Wash your hands with soap and water for at least 20 seconds before and after you change your dressing. If soap and water are not available, use alcohol-based hand sanitizer. Place any used dressings or infusion bags into a plastic bag. Throw that bag in the trash. Keep the dressing that covers the  needle clean and dry. Do not get it wet. Do not use scissors or sharp objects near the infusion tubing. Keep any external tubes clamped, unless they are being used. Check your port site every day for signs of infection. Check for: Redness, swelling, or pain. Fluid or blood. Warmth. Pus or a bad smell. Protect the skin around the port site. Avoid wearing bra straps that rub or irritate the site. Protect the skin around your port from seat belts. Place a soft pad over your chest if needed. Bathe or shower as told by your health care provider. The site may get wet as long as you are not actively receiving an infusion. General instructions  Return to your normal activities as told by your health care provider. Ask your health care provider what activities are safe for you. Carry a medical alert card or wear a medical alert bracelet at all times. This will let health care providers know that you have an implanted port in case of an  emergency. Where to find more information [employer]: www.cancer.[place] [employer] On[employer]: www.cancer.net Contact a health care provider if: You have a fever or chills. You have redness, swelling, or pain at the port site. You have fluid or blood coming from your port site. Your incision feels warm to the touch. You have pus or a bad smell coming from the port site. Summary Implanted ports are usually placed in the chest for long-term IV access. Follow instructions from your health care provider about flushing the port and changing bandages (dressings). Take care of the area around your port by avoiding clothing that puts pressure on the area, and by watching for signs of infection. Protect the skin around your port from seat belts. Place a soft pad over your chest if needed. Contact a health care provider if you have a fever or you have redness, swelling, pain, fluid, or a bad smell at the port site. This information is not intended to replace advice given to you by your health care provider. Make sure you discuss any questions you have with your health care provider. Document Revised: 10/09/2020 Document Reviewed: 10/09/2020 Elsevier Patient Education  [place].

## 2022-02-27 NOTE — Progress Notes (Signed)
Per Dr. Julien Nordmann, okay to proceed with treatment today with CMP from 10/31 as today's draw hemolyzed.

## 2022-02-28 ENCOUNTER — Other Ambulatory Visit: Payer: Self-pay

## 2022-03-02 ENCOUNTER — Other Ambulatory Visit: Payer: Self-pay

## 2022-03-05 ENCOUNTER — Other Ambulatory Visit: Payer: Self-pay

## 2022-03-11 ENCOUNTER — Telehealth: Payer: Self-pay | Admitting: Internal Medicine

## 2022-03-11 NOTE — Telephone Encounter (Signed)
Called patient regarding upcoming November, December and January appointments. Patient is notified.
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
## 2022-03-19 ENCOUNTER — Other Ambulatory Visit: Payer: Medicare Other

## 2022-03-19 ENCOUNTER — Inpatient Hospital Stay (HOSPITAL_BASED_OUTPATIENT_CLINIC_OR_DEPARTMENT_OTHER): Payer: Medicare Other | Admitting: Physician Assistant

## 2022-03-19 ENCOUNTER — Other Ambulatory Visit: Payer: Self-pay

## 2022-03-19 ENCOUNTER — Ambulatory Visit (HOSPITAL_COMMUNITY)
Admission: RE | Admit: 2022-03-19 | Discharge: 2022-03-19 | Disposition: A | Discharge status: Home / Self Care | Payer: Medicare Other | Source: Ambulatory Visit | Attending: Physician Assistant | Admitting: Physician Assistant

## 2022-03-19 ENCOUNTER — Inpatient Hospital Stay: Payer: Medicare Other

## 2022-03-19 VITALS — BP 146/93 | HR 95 | Temp 100.0°F | Resp 17 | Wt 161.6 lb

## 2022-03-19 VITALS — Temp 98.2°F

## 2022-03-19 DIAGNOSIS — Z5111 Encounter for antineoplastic chemotherapy: Secondary | ICD-10-CM | POA: Insufficient documentation

## 2022-03-19 DIAGNOSIS — Z95828 Presence of other vascular implants and grafts: Secondary | ICD-10-CM

## 2022-03-19 DIAGNOSIS — C3491 Malignant neoplasm of unspecified part of right bronchus or lung: Secondary | ICD-10-CM

## 2022-03-19 LAB — CMP (CANCER CENTER ONLY)
ALT: 24 U/L (ref 0–44)
AST: 22 U/L (ref 15–41)
Albumin: 4.2 g/dL (ref 3.5–5.0)
Alkaline Phosphatase: 71 U/L (ref 38–126)
Anion gap: 7 (ref 5–15)
BUN: 13 mg/dL (ref 8–23)
CO2: 28 mmol/L (ref 22–32)
Calcium: 10.1 mg/dL (ref 8.9–10.3)
Chloride: 107 mmol/L (ref 98–111)
Creatinine: 0.79 mg/dL (ref 0.44–1.00)
GFR, Estimated: >60 mL/min (ref >60)
Glucose, Bld: 107 mg/dL — ABNORMAL HIGH (ref 70–99)
Potassium: 3.8 mmol/L (ref 3.5–5.1)
Sodium: 142 mmol/L (ref 135–145)
Total Bilirubin: 0.2 mg/dL — ABNORMAL LOW (ref 0.3–1.2)
Total Protein: 7 g/dL (ref 6.5–8.1)

## 2022-03-19 LAB — CBC WITH DIFFERENTIAL (CANCER CENTER ONLY)
Abs Immature Granulocytes: 0.02 10*3/uL (ref 0.00–0.07)
Basophils Absolute: 0 10*3/uL (ref 0.0–0.1)
Basophils Relative: 0 %
Eosinophils Absolute: 0.1 10*3/uL (ref 0.0–0.5)
Eosinophils Relative: 1 %
HCT: 28 % — ABNORMAL LOW (ref 36.0–46.0)
Hemoglobin: 9.7 g/dL — ABNORMAL LOW (ref 12.0–15.0)
Immature Granulocytes: 0 %
Lymphocytes Relative: 40 %
Lymphs Abs: 1.9 10*3/uL (ref 0.7–4.0)
MCH: 34.4 pg — ABNORMAL HIGH (ref 26.0–34.0)
MCHC: 34.6 g/dL (ref 30.0–36.0)
MCV: 99.3 fL (ref 80.0–100.0)
Monocytes Absolute: 0.9 10*3/uL (ref 0.1–1.0)
Monocytes Relative: 18 %
Neutro Abs: 1.9 10*3/uL (ref 1.7–7.7)
Neutrophils Relative %: 41 %
Platelet Count: 133 10*3/uL — ABNORMAL LOW (ref 150–400)
RBC: 2.82 MIL/uL — ABNORMAL LOW (ref 3.87–5.11)
RDW: 22.5 % — ABNORMAL HIGH (ref 11.5–15.5)
WBC Count: 4.8 10*3/uL (ref 4.0–10.5)
nRBC: 0 % (ref 0.0–0.2)

## 2022-03-19 MED ORDER — SODIUM CHLORIDE 0.9% FLUSH
10.0000 mL | INTRAVENOUS | Status: DC | PRN
  Administered 2022-03-19: 10 mL

## 2022-03-19 MED ORDER — HEPARIN SOD (PORK) LOCK FLUSH 100 UNIT/ML IV SOLN
500.0000 [IU] | Freq: Once | INTRAVENOUS | Status: AC | PRN
  Administered 2022-03-19: 500 [IU]

## 2022-03-19 MED ORDER — SODIUM CHLORIDE 0.9 % IV SOLN
500.0000 mg/m2 | Freq: Once | INTRAVENOUS | Status: AC
  Administered 2022-03-19: 900 mg via INTRAVENOUS
  Filled 2022-03-19: qty 20

## 2022-03-19 MED ORDER — SODIUM CHLORIDE 0.9 % IV SOLN
200.0000 mg | Freq: Once | INTRAVENOUS | Status: AC
  Administered 2022-03-19: 200 mg via INTRAVENOUS
  Filled 2022-03-19: qty 200

## 2022-03-19 MED ORDER — SODIUM CHLORIDE 0.9% FLUSH
10.0000 mL | INTRAVENOUS | Status: AC | PRN
  Administered 2022-03-19: 10 mL

## 2022-03-19 MED ORDER — PROCHLORPERAZINE MALEATE 10 MG PO TABS
10.0000 mg | ORAL_TABLET | Freq: Once | ORAL | Status: AC
  Administered 2022-03-19: 10 mg via ORAL
  Filled 2022-03-19: qty 1

## 2022-03-19 MED ORDER — SODIUM CHLORIDE 0.9 % IV SOLN: Freq: Once | INTRAVENOUS | Status: AC

## 2022-03-19 NOTE — Patient Instructions (Signed)
[facility]  Discharge Instructions: Thank you for choosing [facility] to provide your oncology and hematology care.   If you have a lab appointment with the [facility], please go directly to the [facility] and check in at the registration area.   Wear comfortable clothing and clothing appropriate for easy access to any Portacath or PICC line.   We strive to give you quality time with your provider. You may need to reschedule your appointment if you arrive late (15 or more minutes).  Arriving late affects you and other patients whose appointments are after yours.  Also, if you miss three or more appointments without notifying the office, you may be dismissed from the clinic at the provider's discretion.      For prescription refill requests, have your pharmacy contact our office and allow 72 hours for refills to be completed.    Today you received the following chemotherapy and/or immunotherapy agents: pembrolizumab and pemetrexed      To help prevent nausea and vomiting after your treatment, we encourage you to take your nausea medication as directed.  BELOW ARE SYMPTOMS THAT SHOULD BE REPORTED IMMEDIATELY: *FEVER GREATER THAN 100.4 F (38 C) OR HIGHER *CHILLS OR SWEATING *NAUSEA AND VOMITING THAT IS NOT CONTROLLED WITH YOUR NAUSEA MEDICATION *UNUSUAL SHORTNESS OF BREATH *UNUSUAL BRUISING OR BLEEDING *URINARY PROBLEMS (pain or burning when urinating, or frequent urination) *BOWEL PROBLEMS (unusual diarrhea, constipation, pain near the anus) TENDERNESS IN MOUTH AND THROAT WITH OR WITHOUT PRESENCE OF ULCERS (sore throat, sores in mouth, or a toothache) UNUSUAL RASH, SWELLING OR PAIN  UNUSUAL VAGINAL DISCHARGE OR ITCHING   Items with * indicate a potential emergency and should be followed up as soon as possible or go to the Emergency Department if any problems should occur.  Please show the CHEMOTHERAPY ALERT CARD or IMMUNOTHERAPY ALERT  CARD at check-in to the Emergency Department and triage nurse.  Should you have questions after your visit or need to cancel or reschedule your appointment, please contact [facility]  Dept: [phone]  and follow the prompts.  Office hours are 8:00 a.m. to 4:30 p.m. Monday - Friday. Please note that voicemails left after 4:00 p.m. may not be returned until the following business day.  We are closed weekends and major holidays. You have access to a nurse at all times for urgent questions. Please call the main number to the clinic Dept: [phone] and follow the prompts.   For any non-urgent questions, you may also contact your provider using MyChart. We now offer e-Visits for anyone 62 and older to request care online for non-urgent symptoms. For details visit [email or web address].   Also download the MyChart app! Go to the app store, search "MyChart", open the app, select [facility], and log in with your MyChart username and password.  Masks are optional in the cancer centers. If you would like for your care team to wear a mask while they are taking care of you, please let them know. You may have one support person who is at least 65 years old accompany you for your appointments.

## 2022-03-20 ENCOUNTER — Telehealth: Payer: Self-pay | Admitting: Physician Assistant

## 2022-03-20 NOTE — Telephone Encounter (Signed)
I called the patient to review her CXR results. We also reviewed some of her questions about timing of imaging and different lines of therapy. We also reviewed her last CT results because she was wondering if the primary lung lesion was getting smaller. All questions answered.

## 2022-03-21 ENCOUNTER — Other Ambulatory Visit: Payer: Self-pay

## 2022-03-26 ENCOUNTER — Telehealth: Payer: Self-pay | Admitting: Medical Oncology

## 2022-03-26 ENCOUNTER — Other Ambulatory Visit: Payer: Self-pay | Admitting: Medical Oncology

## 2022-03-26 DIAGNOSIS — D649 Anemia, unspecified: Secondary | ICD-10-CM

## 2022-03-26 NOTE — Telephone Encounter (Signed)
HGB drifted down /? Iron -Pt asking if she  needs to take supplemental iron .

## 2022-03-26 NOTE — Telephone Encounter (Addendum)
Yvette Curry reports being cold (with thermostat up to 78 degrees) and feeling very tired. Denies fever,  bruising or bleeding.   Per Dr Julien Nordmann lab appt tomorrow and labs ordered.

## 2022-03-27 ENCOUNTER — Other Ambulatory Visit: Payer: Self-pay

## 2022-03-27 ENCOUNTER — Other Ambulatory Visit: Payer: Self-pay | Admitting: Physician Assistant

## 2022-03-27 ENCOUNTER — Inpatient Hospital Stay: Payer: Medicare Other

## 2022-03-27 ENCOUNTER — Inpatient Hospital Stay: Payer: Medicare Other | Attending: Internal Medicine

## 2022-03-27 DIAGNOSIS — C3411 Malignant neoplasm of upper lobe, right bronchus or lung: Secondary | ICD-10-CM | POA: Diagnosis present

## 2022-03-27 DIAGNOSIS — R1013 Epigastric pain: Secondary | ICD-10-CM | POA: Insufficient documentation

## 2022-03-27 DIAGNOSIS — Z5111 Encounter for antineoplastic chemotherapy: Secondary | ICD-10-CM | POA: Diagnosis present

## 2022-03-27 DIAGNOSIS — D709 Neutropenia, unspecified: Secondary | ICD-10-CM | POA: Insufficient documentation

## 2022-03-27 DIAGNOSIS — R748 Abnormal levels of other serum enzymes: Secondary | ICD-10-CM | POA: Diagnosis not present

## 2022-03-27 DIAGNOSIS — Z5112 Encounter for antineoplastic immunotherapy: Secondary | ICD-10-CM | POA: Diagnosis present

## 2022-03-27 DIAGNOSIS — Z5189 Encounter for other specified aftercare: Secondary | ICD-10-CM | POA: Diagnosis not present

## 2022-03-27 DIAGNOSIS — Z79899 Other long term (current) drug therapy: Secondary | ICD-10-CM | POA: Diagnosis not present

## 2022-03-27 DIAGNOSIS — D649 Anemia, unspecified: Secondary | ICD-10-CM

## 2022-03-27 DIAGNOSIS — T451X5A Adverse effect of antineoplastic and immunosuppressive drugs, initial encounter: Secondary | ICD-10-CM

## 2022-03-27 DIAGNOSIS — K219 Gastro-esophageal reflux disease without esophagitis: Secondary | ICD-10-CM | POA: Insufficient documentation

## 2022-03-27 DIAGNOSIS — F17210 Nicotine dependence, cigarettes, uncomplicated: Secondary | ICD-10-CM | POA: Diagnosis not present

## 2022-03-27 DIAGNOSIS — Z23 Encounter for immunization: Secondary | ICD-10-CM | POA: Insufficient documentation

## 2022-03-27 LAB — CBC WITH DIFFERENTIAL (CANCER CENTER ONLY)
Abs Immature Granulocytes: 0.02 10*3/uL (ref 0.00–0.07)
Basophils Absolute: 0 10*3/uL (ref 0.0–0.1)
Basophils Relative: 1 %
Eosinophils Absolute: 0 10*3/uL (ref 0.0–0.5)
Eosinophils Relative: 1 %
HCT: 25.8 % — ABNORMAL LOW (ref 36.0–46.0)
Hemoglobin: 9 g/dL — ABNORMAL LOW (ref 12.0–15.0)
Immature Granulocytes: 1 %
Lymphocytes Relative: 53 %
Lymphs Abs: 1.3 10*3/uL (ref 0.7–4.0)
MCH: 35.6 pg — ABNORMAL HIGH (ref 26.0–34.0)
MCHC: 34.9 g/dL (ref 30.0–36.0)
MCV: 102 fL — ABNORMAL HIGH (ref 80.0–100.0)
Monocytes Absolute: 0.5 10*3/uL (ref 0.1–1.0)
Monocytes Relative: 22 %
Neutro Abs: 0.5 10*3/uL — ABNORMAL LOW (ref 1.7–7.7)
Neutrophils Relative %: 22 %
Platelet Count: 109 10*3/uL — ABNORMAL LOW (ref 150–400)
RBC: 2.53 MIL/uL — ABNORMAL LOW (ref 3.87–5.11)
RDW: 21.4 % — ABNORMAL HIGH (ref 11.5–15.5)
WBC Count: 2.5 10*3/uL — ABNORMAL LOW (ref 4.0–10.5)
nRBC: 0 % (ref 0.0–0.2)

## 2022-03-27 LAB — SAMPLE TO BLOOD BANK

## 2022-03-27 LAB — ABO/RH: ABO/RH(D): O POS

## 2022-03-27 MED ORDER — SODIUM CHLORIDE 0.9% FLUSH
10.0000 mL | INTRAVENOUS | Status: DC | PRN
  Administered 2022-03-27: 10 mL via INTRAVENOUS

## 2022-03-27 MED ORDER — HEPARIN SOD (PORK) LOCK FLUSH 100 UNIT/ML IV SOLN
500.0000 [IU] | Freq: Once | INTRAVENOUS | Status: AC
  Administered 2022-03-27: 500 [IU] via INTRAVENOUS

## 2022-03-28 ENCOUNTER — Other Ambulatory Visit: Payer: Self-pay

## 2022-03-28 ENCOUNTER — Inpatient Hospital Stay (HOSPITAL_BASED_OUTPATIENT_CLINIC_OR_DEPARTMENT_OTHER): Payer: Medicare Other | Admitting: Physician Assistant

## 2022-03-28 ENCOUNTER — Other Ambulatory Visit: Payer: Self-pay | Admitting: Medical Oncology

## 2022-03-28 ENCOUNTER — Encounter: Payer: Self-pay | Admitting: Physician Assistant

## 2022-03-28 ENCOUNTER — Inpatient Hospital Stay: Payer: Medicare Other

## 2022-03-28 ENCOUNTER — Encounter: Payer: Self-pay | Admitting: Internal Medicine

## 2022-03-28 VITALS — BP 132/80 | HR 100 | Temp 98.9°F | Resp 18

## 2022-03-28 VITALS — BP 116/79 | HR 90 | Temp 98.4°F | Resp 16 | Wt 158.9 lb

## 2022-03-28 DIAGNOSIS — Z95828 Presence of other vascular implants and grafts: Secondary | ICD-10-CM | POA: Diagnosis not present

## 2022-03-28 DIAGNOSIS — Z5112 Encounter for antineoplastic immunotherapy: Secondary | ICD-10-CM | POA: Diagnosis not present

## 2022-03-28 DIAGNOSIS — R1013 Epigastric pain: Secondary | ICD-10-CM

## 2022-03-28 DIAGNOSIS — C3491 Malignant neoplasm of unspecified part of right bronchus or lung: Secondary | ICD-10-CM | POA: Diagnosis not present

## 2022-03-28 DIAGNOSIS — T451X5A Adverse effect of antineoplastic and immunosuppressive drugs, initial encounter: Secondary | ICD-10-CM

## 2022-03-28 LAB — CMP (CANCER CENTER ONLY)
ALT: 49 U/L — ABNORMAL HIGH (ref 0–44)
AST: 46 U/L — ABNORMAL HIGH (ref 15–41)
Albumin: 4.1 g/dL (ref 3.5–5.0)
Alkaline Phosphatase: 62 U/L (ref 38–126)
Anion gap: 5 (ref 5–15)
BUN: 11 mg/dL (ref 8–23)
CO2: 31 mmol/L (ref 22–32)
Calcium: 10.4 mg/dL — ABNORMAL HIGH (ref 8.9–10.3)
Chloride: 105 mmol/L (ref 98–111)
Creatinine: 0.73 mg/dL (ref 0.44–1.00)
GFR, Estimated: >60 mL/min (ref >60)
Glucose, Bld: 102 mg/dL — ABNORMAL HIGH (ref 70–99)
Potassium: 3.9 mmol/L (ref 3.5–5.1)
Sodium: 141 mmol/L (ref 135–145)
Total Bilirubin: 0.3 mg/dL (ref 0.3–1.2)
Total Protein: 6.9 g/dL (ref 6.5–8.1)

## 2022-03-28 LAB — LIPASE, BLOOD: Lipase: 44 U/L (ref 11–51)

## 2022-03-28 MED ORDER — HEPARIN SOD (PORK) LOCK FLUSH 100 UNIT/ML IV SOLN
500.0000 [IU] | Freq: Once | INTRAVENOUS | Status: AC
  Administered 2022-03-28: 500 [IU] via INTRAVENOUS

## 2022-03-28 MED ORDER — SODIUM CHLORIDE 0.9% FLUSH
10.0000 mL | Freq: Once | INTRAVENOUS | Status: AC
  Administered 2022-03-28: 10 mL via INTRAVENOUS

## 2022-03-28 MED ORDER — SODIUM CHLORIDE 0.9 % IV SOLN: Freq: Once | INTRAVENOUS | Status: AC

## 2022-03-28 MED ORDER — FILGRASTIM-SNDZ 300 MCG/0.5ML IJ SOSY
300.0000 ug | PREFILLED_SYRINGE | Freq: Once | INTRAMUSCULAR | Status: AC
  Administered 2022-03-28: 300 ug via SUBCUTANEOUS
  Filled 2022-03-28: qty 0.5

## 2022-03-28 NOTE — Progress Notes (Signed)
  Pt reports upper abdominal pain , below sternum and a lot of burping and gas. She is coming in for GCSF .   Pt confirmed appts for injections today. Pt scheduled for  [facility].

## 2022-03-28 NOTE — Patient Instructions (Signed)
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
## 2022-03-28 NOTE — Patient Instructions (Signed)
Filgrastim Injection What is this medication? FILGRASTIM (fil GRA stim) lowers the risk of infection in people who are receiving chemotherapy. It works by Building control surveyor make more white blood cells, which protects your body from infection. It may also be used to help people who have been exposed to high doses of radiation. It can be used to help prepare your body before a stem cell transplant. It works by helping your bone marrow make and release stem cells into the blood. This medicine may be used for other purposes; ask your health care provider or pharmacist if you have questions. COMMON BRAND NAME(S): Neupogen, Nivestym, Releuko, Zarxio What should I tell my care team before I take this medication? They need to know if you have any of these conditions: History of blood diseases, such as sickle cell anemia Kidney disease Recent or ongoing radiation An unusual or allergic reaction to filgrastim, pegfilgrastim, latex, rubber, other medications, foods, dyes, or preservatives Pregnant or trying to get pregnant Breast-feeding How should I use this medication? This medication is injected under the skin or into a vein. It is usually given by your care team in a hospital or clinic setting. It may be given at home. If you get this medication at home, you will be taught how to prepare and give it. Use exactly as directed. Take it as directed on the prescription label at the same time every day. Keep taking it unless your care team tells you to stop. It is important that you put your used needles and syringes in a special sharps container. Do not put them in a trash can. If you do not have a sharps container, call your pharmacist or care team to get one. This medication comes with INSTRUCTIONS FOR USE. Ask your pharmacist for directions on how to use this medication. Read the information carefully. Talk to your pharmacist or care team if you have questions. Talk to your care team about the use of this  medication in children. While it may be prescribed for children for selected conditions, precautions do apply. Overdosage: If you think you have taken too much of this medicine contact a poison control center or emergency room at once. NOTE: This medicine is only for you. Do not share this medicine with others. What if I miss a dose? It is important not to miss any doses. Talk to your care team about what to do if you miss a dose. What may interact with this medication? Medications that may cause a release of neutrophils, such as lithium This list may not describe all possible interactions. Give your health care provider a list of all the medicines, herbs, non-prescription drugs, or dietary supplements you use. Also tell them if you smoke, drink alcohol, or use illegal drugs. Some items may interact with your medicine. What should I watch for while using this medication? Your condition will be monitored carefully while you are receiving this medication. You may need bloodwork while taking this medication. Talk to your care team about your risk of cancer. You may be more at risk for certain types of cancer if you take this medication. What side effects may I notice from receiving this medication? Side effects that you should report to your care team as soon as possible: Allergic reactions--skin rash, itching, hives, swelling of the face, lips, tongue, or throat Capillary leak syndrome--stomach or muscle pain, unusual weakness or fatigue, feeling faint or lightheaded, decrease in the amount of urine, swelling of the ankles, hands, or  feet, trouble breathing High white blood cell level--fever, fatigue, trouble breathing, night sweats, change in vision, weight loss Inflammation of the aorta--fever, fatigue, back, chest, or stomach pain, severe headache Kidney injury (glomerulonephritis)--decrease in the amount of urine, red or dark brown urine, foamy or bubbly urine, swelling of the ankles, hands, or  feet Shortness of breath or trouble breathing Spleen injury--pain in upper left stomach or shoulder Unusual bruising or bleeding Side effects that usually do not require medical attention (report to your care team if they continue or are bothersome): Back pain Bone pain Fatigue Fever Headache Nausea This list may not describe all possible side effects. Call your doctor for medical advice about side effects. You may report side effects to FDA at 1-800-FDA-1088. Where should I keep my medication? Keep out of the reach of children and pets. Keep this medication in the original packaging until you are ready to take it. Protect from light. See product for storage information. Each product may have different instructions. Get rid of any unused medication after the expiration date. To get rid of medications that are no longer needed or have expired: Take the medication to a medications take-back program. Check with your pharmacy or law enforcement to find a location. If you cannot return the medication, ask your pharmacist or care team how to get rid of this medication safely. NOTE: This sheet is a summary. It may not cover all possible information. If you have questions about this medicine, talk to your doctor, pharmacist, or health care provider.  2023 Elsevier/Gold Standard (2021-07-16 00:00:00)

## 2022-03-29 ENCOUNTER — Inpatient Hospital Stay: Payer: Medicare Other

## 2022-03-29 VITALS — BP 131/92 | HR 104 | Temp 98.8°F | Resp 16

## 2022-03-29 DIAGNOSIS — Z5112 Encounter for antineoplastic immunotherapy: Secondary | ICD-10-CM | POA: Diagnosis not present

## 2022-03-29 DIAGNOSIS — D701 Agranulocytosis secondary to cancer chemotherapy: Secondary | ICD-10-CM

## 2022-03-29 MED ORDER — FILGRASTIM-SNDZ 300 MCG/0.5ML IJ SOSY
300.0000 ug | PREFILLED_SYRINGE | Freq: Once | INTRAMUSCULAR | Status: AC
  Administered 2022-03-29: 300 ug via SUBCUTANEOUS
  Filled 2022-03-29: qty 0.5

## 2022-03-29 NOTE — Patient Instructions (Signed)
Filgrastim Injection What is this medication? FILGRASTIM (fil GRA stim) lowers the risk of infection in people who are receiving chemotherapy. It works by Building control surveyor make more white blood cells, which protects your body from infection. It may also be used to help people who have been exposed to high doses of radiation. It can be used to help prepare your body before a stem cell transplant. It works by helping your bone marrow make and release stem cells into the blood. This medicine may be used for other purposes; ask your health care provider or pharmacist if you have questions. COMMON BRAND NAME(S): Neupogen, Nivestym, Releuko, Zarxio What should I tell my care team before I take this medication? They need to know if you have any of these conditions: History of blood diseases, such as sickle cell anemia Kidney disease Recent or ongoing radiation An unusual or allergic reaction to filgrastim, pegfilgrastim, latex, rubber, other medications, foods, dyes, or preservatives Pregnant or trying to get pregnant Breast-feeding How should I use this medication? This medication is injected under the skin or into a vein. It is usually given by your care team in a hospital or clinic setting. It may be given at home. If you get this medication at home, you will be taught how to prepare and give it. Use exactly as directed. Take it as directed on the prescription label at the same time every day. Keep taking it unless your care team tells you to stop. It is important that you put your used needles and syringes in a special sharps container. Do not put them in a trash can. If you do not have a sharps container, call your pharmacist or care team to get one. This medication comes with INSTRUCTIONS FOR USE. Ask your pharmacist for directions on how to use this medication. Read the information carefully. Talk to your pharmacist or care team if you have questions. Talk to your care team about the use of this  medication in children. While it may be prescribed for children for selected conditions, precautions do apply. Overdosage: If you think you have taken too much of this medicine contact a poison control center or emergency room at once. NOTE: This medicine is only for you. Do not share this medicine with others. What if I miss a dose? It is important not to miss any doses. Talk to your care team about what to do if you miss a dose. What may interact with this medication? Medications that may cause a release of neutrophils, such as lithium This list may not describe all possible interactions. Give your health care provider a list of all the medicines, herbs, non-prescription drugs, or dietary supplements you use. Also tell them if you smoke, drink alcohol, or use illegal drugs. Some items may interact with your medicine. What should I watch for while using this medication? Your condition will be monitored carefully while you are receiving this medication. You may need bloodwork while taking this medication. Talk to your care team about your risk of cancer. You may be more at risk for certain types of cancer if you take this medication. What side effects may I notice from receiving this medication? Side effects that you should report to your care team as soon as possible: Allergic reactions--skin rash, itching, hives, swelling of the face, lips, tongue, or throat Capillary leak syndrome--stomach or muscle pain, unusual weakness or fatigue, feeling faint or lightheaded, decrease in the amount of urine, swelling of the ankles, hands, or  feet, trouble breathing High white blood cell level--fever, fatigue, trouble breathing, night sweats, change in vision, weight loss Inflammation of the aorta--fever, fatigue, back, chest, or stomach pain, severe headache Kidney injury (glomerulonephritis)--decrease in the amount of urine, red or dark brown urine, foamy or bubbly urine, swelling of the ankles, hands, or  feet Shortness of breath or trouble breathing Spleen injury--pain in upper left stomach or shoulder Unusual bruising or bleeding Side effects that usually do not require medical attention (report to your care team if they continue or are bothersome): Back pain Bone pain Fatigue Fever Headache Nausea This list may not describe all possible side effects. Call your doctor for medical advice about side effects. You may report side effects to FDA at 1-800-FDA-1088. Where should I keep my medication? Keep out of the reach of children and pets. Keep this medication in the original packaging until you are ready to take it. Protect from light. See product for storage information. Each product may have different instructions. Get rid of any unused medication after the expiration date. To get rid of medications that are no longer needed or have expired: Take the medication to a medications take-back program. Check with your pharmacy or law enforcement to find a location. If you cannot return the medication, ask your pharmacist or care team how to get rid of this medication safely. NOTE: This sheet is a summary. It may not cover all possible information. If you have questions about this medicine, talk to your doctor, pharmacist, or health care provider.  2023 Elsevier/Gold Standard (2021-07-16 00:00:00)

## 2022-04-08 ENCOUNTER — Other Ambulatory Visit: Payer: Self-pay

## 2022-04-08 ENCOUNTER — Inpatient Hospital Stay (HOSPITAL_BASED_OUTPATIENT_CLINIC_OR_DEPARTMENT_OTHER): Payer: Medicare Other | Admitting: Internal Medicine

## 2022-04-08 ENCOUNTER — Inpatient Hospital Stay: Payer: Medicare Other

## 2022-04-08 ENCOUNTER — Other Ambulatory Visit: Payer: Medicare Other

## 2022-04-08 ENCOUNTER — Other Ambulatory Visit: Payer: Self-pay | Admitting: Medical Oncology

## 2022-04-08 VITALS — BP 135/82 | HR 94 | Temp 98.3°F | Resp 16 | Wt 161.7 lb

## 2022-04-08 DIAGNOSIS — Z5112 Encounter for antineoplastic immunotherapy: Secondary | ICD-10-CM | POA: Diagnosis not present

## 2022-04-08 DIAGNOSIS — C3491 Malignant neoplasm of unspecified part of right bronchus or lung: Secondary | ICD-10-CM

## 2022-04-08 DIAGNOSIS — Z23 Encounter for immunization: Secondary | ICD-10-CM

## 2022-04-08 DIAGNOSIS — C3490 Malignant neoplasm of unspecified part of unspecified bronchus or lung: Secondary | ICD-10-CM

## 2022-04-08 DIAGNOSIS — Z5111 Encounter for antineoplastic chemotherapy: Secondary | ICD-10-CM

## 2022-04-08 LAB — CMP (CANCER CENTER ONLY)
ALT: 18 U/L (ref 0–44)
AST: 20 U/L (ref 15–41)
Albumin: 3.3 g/dL — ABNORMAL LOW (ref 3.5–5.0)
Alkaline Phosphatase: 57 U/L (ref 38–126)
Anion gap: 8 (ref 5–15)
BUN: 18 mg/dL (ref 8–23)
CO2: 26 mmol/L (ref 22–32)
Calcium: 9.1 mg/dL (ref 8.9–10.3)
Chloride: 107 mmol/L (ref 98–111)
Creatinine: 0.98 mg/dL (ref 0.44–1.00)
GFR, Estimated: >60 mL/min (ref >60)
Glucose, Bld: 107 mg/dL — ABNORMAL HIGH (ref 70–99)
Potassium: 4.1 mmol/L (ref 3.5–5.1)
Sodium: 141 mmol/L (ref 135–145)
Total Bilirubin: 0.4 mg/dL (ref 0.3–1.2)
Total Protein: 7.2 g/dL (ref 6.5–8.1)

## 2022-04-08 LAB — CBC WITH DIFFERENTIAL (CANCER CENTER ONLY)
Abs Immature Granulocytes: 0.05 10*3/uL (ref 0.00–0.07)
Basophils Absolute: 0.1 10*3/uL (ref 0.0–0.1)
Basophils Relative: 1 %
Eosinophils Absolute: 0.1 10*3/uL (ref 0.0–0.5)
Eosinophils Relative: 1 %
HCT: 25.3 % — ABNORMAL LOW (ref 36.0–46.0)
Hemoglobin: 8.4 g/dL — ABNORMAL LOW (ref 12.0–15.0)
Immature Granulocytes: 1 %
Lymphocytes Relative: 23 %
Lymphs Abs: 1.9 10*3/uL (ref 0.7–4.0)
MCH: 35 pg — ABNORMAL HIGH (ref 26.0–34.0)
MCHC: 33.2 g/dL (ref 30.0–36.0)
MCV: 105.4 fL — ABNORMAL HIGH (ref 80.0–100.0)
Monocytes Absolute: 1.1 10*3/uL — ABNORMAL HIGH (ref 0.1–1.0)
Monocytes Relative: 13 %
Neutro Abs: 5.1 10*3/uL (ref 1.7–7.7)
Neutrophils Relative %: 61 %
Platelet Count: 324 10*3/uL (ref 150–400)
RBC: 2.4 MIL/uL — ABNORMAL LOW (ref 3.87–5.11)
RDW: 20.3 % — ABNORMAL HIGH (ref 11.5–15.5)
WBC Count: 8.2 10*3/uL (ref 4.0–10.5)
nRBC: 0 % (ref 0.0–0.2)

## 2022-04-08 LAB — TSH: TSH: 2.053 u[IU]/mL (ref 0.350–4.500)

## 2022-04-08 MED ORDER — SODIUM CHLORIDE 0.9 % IV SOLN
400.0000 mg/m2 | Freq: Once | INTRAVENOUS | Status: AC
  Administered 2022-04-08: 700 mg via INTRAVENOUS
  Filled 2022-04-08: qty 20

## 2022-04-08 MED ORDER — CYANOCOBALAMIN 1000 MCG/ML IJ SOLN
1000.0000 ug | Freq: Once | INTRAMUSCULAR | Status: AC
  Administered 2022-04-08: 1000 ug via INTRAMUSCULAR
  Filled 2022-04-08: qty 1

## 2022-04-08 MED ORDER — PROCHLORPERAZINE MALEATE 10 MG PO TABS
10.0000 mg | ORAL_TABLET | Freq: Once | ORAL | Status: AC
  Administered 2022-04-08: 10 mg via ORAL
  Filled 2022-04-08: qty 1

## 2022-04-08 MED ORDER — SODIUM CHLORIDE 0.9 % IV SOLN: Freq: Once | INTRAVENOUS | Status: AC

## 2022-04-08 MED ORDER — INFLUENZA VAC A&B SA ADJ QUAD 0.5 ML IM PRSY
0.5000 mL | PREFILLED_SYRINGE | Freq: Once | INTRAMUSCULAR | Status: AC
  Administered 2022-04-08: 0.5 mL via INTRAMUSCULAR
  Filled 2022-04-08: qty 0.5

## 2022-04-08 MED ORDER — SODIUM CHLORIDE 0.9% FLUSH
10.0000 mL | Freq: Once | INTRAVENOUS | Status: AC
  Administered 2022-04-08: 10 mL via INTRAVENOUS

## 2022-04-08 MED ORDER — SODIUM CHLORIDE 0.9 % IV SOLN
200.0000 mg | Freq: Once | INTRAVENOUS | Status: AC
  Administered 2022-04-08: 200 mg via INTRAVENOUS
  Filled 2022-04-08: qty 200

## 2022-04-08 MED ORDER — HEPARIN SOD (PORK) LOCK FLUSH 100 UNIT/ML IV SOLN
500.0000 [IU] | Freq: Once | INTRAVENOUS | Status: AC | PRN
  Administered 2022-04-08: 500 [IU]

## 2022-04-08 MED ORDER — SODIUM CHLORIDE 0.9% FLUSH
10.0000 mL | INTRAVENOUS | Status: DC | PRN
  Administered 2022-04-08: 10 mL
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
## 2022-04-08 NOTE — Patient Instructions (Signed)
[facility]  Discharge Instructions: Thank you for choosing [facility] to provide your oncology and hematology care.   If you have a lab appointment with the [facility], please go directly to the [facility] and check in at the registration area.   Wear comfortable clothing and clothing appropriate for easy access to any Portacath or PICC line.   We strive to give you quality time with your provider. You may need to reschedule your appointment if you arrive late (15 or more minutes).  Arriving late affects you and other patients whose appointments are after yours.  Also, if you miss three or more appointments without notifying the office, you may be dismissed from the clinic at the provider's discretion.      For prescription refill requests, have your pharmacy contact our office and allow 72 hours for refills to be completed.    Today you received the following chemotherapy and/or immunotherapy agents: Keytruda, Alimta.       To help prevent nausea and vomiting after your treatment, we encourage you to take your nausea medication as directed.  BELOW ARE SYMPTOMS THAT SHOULD BE REPORTED IMMEDIATELY: *FEVER GREATER THAN 100.4 F (38 C) OR HIGHER *CHILLS OR SWEATING *NAUSEA AND VOMITING THAT IS NOT CONTROLLED WITH YOUR NAUSEA MEDICATION *UNUSUAL SHORTNESS OF BREATH *UNUSUAL BRUISING OR BLEEDING *URINARY PROBLEMS (pain or burning when urinating, or frequent urination) *BOWEL PROBLEMS (unusual diarrhea, constipation, pain near the anus) TENDERNESS IN MOUTH AND THROAT WITH OR WITHOUT PRESENCE OF ULCERS (sore throat, sores in mouth, or a toothache) UNUSUAL RASH, SWELLING OR PAIN  UNUSUAL VAGINAL DISCHARGE OR ITCHING   Items with * indicate a potential emergency and should be followed up as soon as possible or go to the Emergency Department if any problems should occur.  Please show the CHEMOTHERAPY ALERT CARD or IMMUNOTHERAPY ALERT CARD at  check-in to the Emergency Department and triage nurse.  Should you have questions after your visit or need to cancel or reschedule your appointment, please contact [facility]  Dept: [phone]  and follow the prompts.  Office hours are 8:00 a.m. to 4:30 p.m. Monday - Friday. Please note that voicemails left after 4:00 p.m. may not be returned until the following business day.  We are closed weekends and major holidays. You have access to a nurse at all times for urgent questions. Please call the main number to the clinic Dept: [phone] and follow the prompts.   For any non-urgent questions, you may also contact your provider using MyChart. We now offer e-Visits for anyone 57 and older to request care online for non-urgent symptoms. For details visit [email or web address].   Also download the MyChart app! Go to the app store, search "MyChart", open the app, select [facility], and log in with your MyChart username and password.  Masks are optional in the cancer centers. If you would like for your care team to wear a mask while they are taking care of you, please let them know. You may have one support person who is at least 65 years old accompany you for your appointments.

## 2022-04-09 LAB — T4: T4, Total: 8.1 ug/dL (ref 4.5–12.0)

## 2022-04-10 ENCOUNTER — Telehealth: Payer: Self-pay | Admitting: Medical Oncology

## 2022-04-10 ENCOUNTER — Other Ambulatory Visit: Payer: Self-pay

## 2022-04-10 NOTE — Telephone Encounter (Signed)
Alimta dose-Did she get a lower dose of chemo ?  I told her she got a lower dose of Alimta from 880 mg to 704 mg,  FYI ,she is more fatigued and having night sweats , upper body is soaked( not bed sheets) and she has to change her top  x several episodes. They started after treatment started.   I told her to monitor symptom for now and to push po fluids.   CT scheduled for jan 8th.

## 2022-04-11 ENCOUNTER — Telehealth: Payer: Self-pay

## 2022-04-14 ENCOUNTER — Other Ambulatory Visit: Payer: Self-pay

## 2022-04-21 ENCOUNTER — Other Ambulatory Visit: Payer: Self-pay

## 2022-04-28 ENCOUNTER — Telehealth: Payer: Self-pay | Admitting: Internal Medicine

## 2022-04-28 ENCOUNTER — Ambulatory Visit (HOSPITAL_COMMUNITY)
Admission: RE | Admit: 2022-04-28 | Discharge: 2022-04-28 | Disposition: A | Discharge status: Home / Self Care | Payer: Medicare Other | Source: Ambulatory Visit | Attending: Internal Medicine | Admitting: Internal Medicine

## 2022-04-28 DIAGNOSIS — C3490 Malignant neoplasm of unspecified part of unspecified bronchus or lung: Secondary | ICD-10-CM | POA: Diagnosis present

## 2022-04-28 MED ORDER — SODIUM CHLORIDE (PF) 0.9 % IJ SOLN
INTRAMUSCULAR | Status: AC
  Filled 2022-04-28: qty 50

## 2022-04-28 MED ORDER — IOHEXOL 300 MG/ML  SOLN
100.0000 mL | Freq: Once | INTRAMUSCULAR | Status: AC | PRN
  Administered 2022-04-28: 100 mL via INTRAVENOUS

## 2022-04-28 MED ORDER — HEPARIN SOD (PORK) LOCK FLUSH 100 UNIT/ML IV SOLN
INTRAVENOUS | Status: AC
  Administered 2022-04-28: 500 [IU]
  Filled 2022-04-28: qty 5

## 2022-04-28 NOTE — Telephone Encounter (Signed)
Called patient regarding upcoming January and February appointments, patient is notified.

## 2022-04-30 ENCOUNTER — Other Ambulatory Visit: Payer: Medicare Other

## 2022-04-30 ENCOUNTER — Ambulatory Visit: Payer: Medicare Other

## 2022-04-30 ENCOUNTER — Inpatient Hospital Stay: Payer: Medicare Other | Attending: Internal Medicine

## 2022-04-30 ENCOUNTER — Inpatient Hospital Stay (HOSPITAL_BASED_OUTPATIENT_CLINIC_OR_DEPARTMENT_OTHER): Payer: Medicare Other | Admitting: Internal Medicine

## 2022-04-30 ENCOUNTER — Other Ambulatory Visit: Payer: Self-pay

## 2022-04-30 ENCOUNTER — Ambulatory Visit: Payer: Medicare Other | Admitting: Physician Assistant

## 2022-04-30 ENCOUNTER — Inpatient Hospital Stay: Payer: Medicare Other

## 2022-04-30 VITALS — BP 162/84 | HR 84 | Temp 98.6°F | Resp 17 | Wt 161.5 lb

## 2022-04-30 VITALS — BP 141/82 | HR 86

## 2022-04-30 DIAGNOSIS — C778 Secondary and unspecified malignant neoplasm of lymph nodes of multiple regions: Secondary | ICD-10-CM | POA: Diagnosis not present

## 2022-04-30 DIAGNOSIS — Z5111 Encounter for antineoplastic chemotherapy: Secondary | ICD-10-CM | POA: Insufficient documentation

## 2022-04-30 DIAGNOSIS — D649 Anemia, unspecified: Secondary | ICD-10-CM | POA: Diagnosis not present

## 2022-04-30 DIAGNOSIS — C3491 Malignant neoplasm of unspecified part of right bronchus or lung: Secondary | ICD-10-CM

## 2022-04-30 DIAGNOSIS — Z5112 Encounter for antineoplastic immunotherapy: Secondary | ICD-10-CM | POA: Insufficient documentation

## 2022-04-30 DIAGNOSIS — I10 Essential (primary) hypertension: Secondary | ICD-10-CM | POA: Insufficient documentation

## 2022-04-30 DIAGNOSIS — Z95828 Presence of other vascular implants and grafts: Secondary | ICD-10-CM

## 2022-04-30 DIAGNOSIS — C3411 Malignant neoplasm of upper lobe, right bronchus or lung: Secondary | ICD-10-CM | POA: Insufficient documentation

## 2022-04-30 DIAGNOSIS — Z79899 Other long term (current) drug therapy: Secondary | ICD-10-CM | POA: Insufficient documentation

## 2022-04-30 LAB — CMP (CANCER CENTER ONLY)
ALT: 42 U/L (ref 0–44)
AST: 34 U/L (ref 15–41)
Albumin: 4 g/dL (ref 3.5–5.0)
Alkaline Phosphatase: 59 U/L (ref 38–126)
Anion gap: 4 — ABNORMAL LOW (ref 5–15)
BUN: 14 mg/dL (ref 8–23)
CO2: 31 mmol/L (ref 22–32)
Calcium: 9.9 mg/dL (ref 8.9–10.3)
Chloride: 107 mmol/L (ref 98–111)
Creatinine: 0.83 mg/dL (ref 0.44–1.00)
GFR, Estimated: >60 mL/min (ref >60)
Glucose, Bld: 89 mg/dL (ref 70–99)
Potassium: 3.9 mmol/L (ref 3.5–5.1)
Sodium: 142 mmol/L (ref 135–145)
Total Bilirubin: 0.3 mg/dL (ref 0.3–1.2)
Total Protein: 6.9 g/dL (ref 6.5–8.1)

## 2022-04-30 LAB — CBC WITH DIFFERENTIAL (CANCER CENTER ONLY)
Abs Immature Granulocytes: 0.03 10*3/uL (ref 0.00–0.07)
Basophils Absolute: 0 10*3/uL (ref 0.0–0.1)
Basophils Relative: 1 %
Eosinophils Absolute: 0.2 10*3/uL (ref 0.0–0.5)
Eosinophils Relative: 2 %
HCT: 33.6 % — ABNORMAL LOW (ref 36.0–46.0)
Hemoglobin: 11.4 g/dL — ABNORMAL LOW (ref 12.0–15.0)
Immature Granulocytes: 1 %
Lymphocytes Relative: 26 %
Lymphs Abs: 1.7 10*3/uL (ref 0.7–4.0)
MCH: 37.1 pg — ABNORMAL HIGH (ref 26.0–34.0)
MCHC: 33.9 g/dL (ref 30.0–36.0)
MCV: 109.4 fL — ABNORMAL HIGH (ref 80.0–100.0)
Monocytes Absolute: 0.8 10*3/uL (ref 0.1–1.0)
Monocytes Relative: 13 %
Neutro Abs: 3.9 10*3/uL (ref 1.7–7.7)
Neutrophils Relative %: 57 %
Platelet Count: 203 10*3/uL (ref 150–400)
RBC: 3.07 MIL/uL — ABNORMAL LOW (ref 3.87–5.11)
RDW: 17.4 % — ABNORMAL HIGH (ref 11.5–15.5)
WBC Count: 6.6 10*3/uL (ref 4.0–10.5)
nRBC: 0 % (ref 0.0–0.2)

## 2022-04-30 MED ORDER — PROCHLORPERAZINE MALEATE 10 MG PO TABS
10.0000 mg | ORAL_TABLET | Freq: Once | ORAL | Status: AC
  Administered 2022-04-30: 10 mg via ORAL
  Filled 2022-04-30: qty 1

## 2022-04-30 MED ORDER — SODIUM CHLORIDE 0.9% FLUSH
10.0000 mL | INTRAVENOUS | Status: AC | PRN
  Administered 2022-04-30: 10 mL

## 2022-04-30 MED ORDER — SODIUM CHLORIDE 0.9 % IV SOLN
200.0000 mg | Freq: Once | INTRAVENOUS | Status: AC
  Administered 2022-04-30: 200 mg via INTRAVENOUS
  Filled 2022-04-30: qty 200

## 2022-04-30 MED ORDER — SODIUM CHLORIDE 0.9% FLUSH
10.0000 mL | INTRAVENOUS | Status: DC | PRN
  Administered 2022-04-30: 10 mL

## 2022-04-30 MED ORDER — SODIUM CHLORIDE 0.9 % IV SOLN
400.0000 mg/m2 | Freq: Once | INTRAVENOUS | Status: AC
  Administered 2022-04-30: 700 mg via INTRAVENOUS
  Filled 2022-04-30: qty 20

## 2022-04-30 MED ORDER — HEPARIN SOD (PORK) LOCK FLUSH 100 UNIT/ML IV SOLN
500.0000 [IU] | Freq: Once | INTRAVENOUS | Status: AC | PRN
  Administered 2022-04-30: 500 [IU]

## 2022-04-30 MED ORDER — SODIUM CHLORIDE 0.9 % IV SOLN: Freq: Once | INTRAVENOUS | Status: AC
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
## 2022-04-30 NOTE — Patient Instructions (Signed)
[facility]  Discharge Instructions: Thank you for choosing [facility] to provide your oncology and hematology care.   If you have a lab appointment with the [facility], please go directly to the [facility] and check in at the registration area.   Wear comfortable clothing and clothing appropriate for easy access to any Portacath or PICC line.   We strive to give you quality time with your provider. You may need to reschedule your appointment if you arrive late (15 or more minutes).  Arriving late affects you and other patients whose appointments are after yours.  Also, if you miss three or more appointments without notifying the office, you may be dismissed from the clinic at the provider's discretion.      For prescription refill requests, have your pharmacy contact our office and allow 72 hours for refills to be completed.    Today you received the following chemotherapy and/or immunotherapy agents: Keytruda and Alimta      To help prevent nausea and vomiting after your treatment, we encourage you to take your nausea medication as directed.  BELOW ARE SYMPTOMS THAT SHOULD BE REPORTED IMMEDIATELY: *FEVER GREATER THAN 100.4 F (38 C) OR HIGHER *CHILLS OR SWEATING *NAUSEA AND VOMITING THAT IS NOT CONTROLLED WITH YOUR NAUSEA MEDICATION *UNUSUAL SHORTNESS OF BREATH *UNUSUAL BRUISING OR BLEEDING *URINARY PROBLEMS (pain or burning when urinating, or frequent urination) *BOWEL PROBLEMS (unusual diarrhea, constipation, pain near the anus) TENDERNESS IN MOUTH AND THROAT WITH OR WITHOUT PRESENCE OF ULCERS (sore throat, sores in mouth, or a toothache) UNUSUAL RASH, SWELLING OR PAIN  UNUSUAL VAGINAL DISCHARGE OR ITCHING   Items with * indicate a potential emergency and should be followed up as soon as possible or go to the Emergency Department if any problems should occur.  Please show the CHEMOTHERAPY ALERT CARD or IMMUNOTHERAPY ALERT CARD at  check-in to the Emergency Department and triage nurse.  Should you have questions after your visit or need to cancel or reschedule your appointment, please contact [facility]  Dept: [phone]  and follow the prompts.  Office hours are 8:00 a.m. to 4:30 p.m. Monday - Friday. Please note that voicemails left after 4:00 p.m. may not be returned until the following business day.  We are closed weekends and major holidays. You have access to a nurse at all times for urgent questions. Please call the main number to the clinic Dept: [phone] and follow the prompts.   For any non-urgent questions, you may also contact your provider using MyChart. We now offer e-Visits for anyone 18 and older to request care online for non-urgent symptoms. For details visit [email or web address].   Also download the MyChart app! Go to the app store, search "MyChart", open the app, select [facility], and log in with your MyChart username and password.

## 2022-05-01 ENCOUNTER — Ambulatory Visit: Payer: Medicare Other

## 2022-05-01 ENCOUNTER — Other Ambulatory Visit: Payer: Medicare Other

## 2022-05-01 ENCOUNTER — Telehealth: Payer: Self-pay | Admitting: Medical Oncology

## 2022-05-01 ENCOUNTER — Ambulatory Visit: Payer: Medicare Other | Admitting: Internal Medicine

## 2022-05-01 NOTE — Telephone Encounter (Signed)
Vision changes-blurry , tearing and dryness, No eye pain, redness or swelling, cataracts removed July and August  2022. Using systane. I instructed her to see eye provider and to call for any pain , redness , crustiness .

## 2022-05-07 ENCOUNTER — Other Ambulatory Visit: Payer: Self-pay

## 2022-05-08 ENCOUNTER — Telehealth: Payer: Self-pay | Admitting: Medical Oncology

## 2022-05-08 NOTE — Telephone Encounter (Signed)
Rash-3-4 days ago broke out in a  bumpy , itchy rash across upper ant chest. IT is no where else on her body. This happened after her first tx and she just completed her 7th cycle. Using hydrocortisone. No other symptoms . She feels fine otherwise.

## 2022-05-08 NOTE — Telephone Encounter (Signed)
Per Dr Arbutus Ped , I told Juleah to continue hydrocortisone and take benadryl 25 mg prn and call if rash spreads and or gets worse.

## 2022-05-15 ENCOUNTER — Telehealth: Payer: Self-pay

## 2022-05-15 NOTE — Telephone Encounter (Signed)
This patient called and stated that she is still having swelling in her lower extremities.  She acknowledges that she was told at her last office visit with [place] to see her primary care concerning the swelling.  She states that she is in between physicians and her new patient appointment is not until February.  Patient would like to know if there is any thing else that she can do besides elevation because the swelling is moving up her legs.  No further questions or concerns noted at this time.

## 2022-05-16 ENCOUNTER — Encounter: Payer: Self-pay | Admitting: Internal Medicine

## 2022-05-16 ENCOUNTER — Other Ambulatory Visit (HOSPITAL_COMMUNITY): Payer: Self-pay | Admitting: Endocrinology

## 2022-05-16 ENCOUNTER — Encounter: Payer: Self-pay | Admitting: Physician Assistant

## 2022-05-16 ENCOUNTER — Telehealth: Payer: Self-pay

## 2022-05-16 ENCOUNTER — Other Ambulatory Visit: Payer: Self-pay | Admitting: Internal Medicine

## 2022-05-16 DIAGNOSIS — R600 Localized edema: Secondary | ICD-10-CM

## 2022-05-16 MED ORDER — FUROSEMIDE 20 MG PO TABS: ORAL_TABLET | ORAL | 0 refills | Status: DC

## 2022-05-16 NOTE — Telephone Encounter (Signed)
This nurse returned a call to this patient related to the swelling in her legs.  Advised that the provider has called in a prescription for lasix to [facility].  Patient acknowledged understanding.  Patient also states that she would like to know if the provider is willing to do a Iron panel with her next labs. This nurse advised that her request will be sent to the doctor  Patient also states that she is having a Ophthalmic Angiography and it requires the use of dye, patient would like to know if the dye will interfere with her current treatment.  Her questions have been forwarded to the provider.  No further questions noted at this time.

## 2022-05-20 ENCOUNTER — Ambulatory Visit (HOSPITAL_COMMUNITY): Payer: Medicare Other

## 2022-05-20 ENCOUNTER — Other Ambulatory Visit: Payer: Self-pay

## 2022-05-20 ENCOUNTER — Ambulatory Visit (HOSPITAL_BASED_OUTPATIENT_CLINIC_OR_DEPARTMENT_OTHER)
Admission: RE | Admit: 2022-05-20 | Discharge: 2022-05-20 | Disposition: A | Discharge status: Home / Self Care | Payer: Medicare Other | Source: Ambulatory Visit | Attending: Endocrinology | Admitting: Endocrinology

## 2022-05-20 DIAGNOSIS — D649 Anemia, unspecified: Secondary | ICD-10-CM

## 2022-05-20 DIAGNOSIS — R600 Localized edema: Secondary | ICD-10-CM | POA: Insufficient documentation

## 2022-05-21 ENCOUNTER — Inpatient Hospital Stay: Payer: Medicare Other

## 2022-05-21 ENCOUNTER — Other Ambulatory Visit: Payer: Medicare Other

## 2022-05-21 ENCOUNTER — Ambulatory Visit: Payer: Medicare Other | Admitting: Physician Assistant

## 2022-05-21 ENCOUNTER — Inpatient Hospital Stay (HOSPITAL_BASED_OUTPATIENT_CLINIC_OR_DEPARTMENT_OTHER): Payer: Medicare Other | Admitting: Internal Medicine

## 2022-05-21 ENCOUNTER — Ambulatory Visit: Payer: Medicare Other

## 2022-05-21 ENCOUNTER — Encounter: Payer: Self-pay | Admitting: Internal Medicine

## 2022-05-21 VITALS — BP 121/71 | HR 71 | Resp 17

## 2022-05-21 DIAGNOSIS — C3491 Malignant neoplasm of unspecified part of right bronchus or lung: Secondary | ICD-10-CM

## 2022-05-21 DIAGNOSIS — Z95828 Presence of other vascular implants and grafts: Secondary | ICD-10-CM

## 2022-05-21 DIAGNOSIS — D649 Anemia, unspecified: Secondary | ICD-10-CM

## 2022-05-21 DIAGNOSIS — Z5112 Encounter for antineoplastic immunotherapy: Secondary | ICD-10-CM | POA: Diagnosis not present

## 2022-05-21 LAB — CMP (CANCER CENTER ONLY)
ALT: 38 U/L (ref 0–44)
AST: 35 U/L (ref 15–41)
Albumin: 4 g/dL (ref 3.5–5.0)
Alkaline Phosphatase: 59 U/L (ref 38–126)
Anion gap: 5 (ref 5–15)
BUN: 17 mg/dL (ref 8–23)
CO2: 30 mmol/L (ref 22–32)
Calcium: 10.4 mg/dL — ABNORMAL HIGH (ref 8.9–10.3)
Chloride: 105 mmol/L (ref 98–111)
Creatinine: 0.88 mg/dL (ref 0.44–1.00)
GFR, Estimated: >60 mL/min (ref >60)
Glucose, Bld: 92 mg/dL (ref 70–99)
Potassium: 4.4 mmol/L (ref 3.5–5.1)
Sodium: 140 mmol/L (ref 135–145)
Total Bilirubin: 0.4 mg/dL (ref 0.3–1.2)
Total Protein: 7.3 g/dL (ref 6.5–8.1)

## 2022-05-21 LAB — CBC WITH DIFFERENTIAL (CANCER CENTER ONLY)
Abs Immature Granulocytes: 0.01 10*3/uL (ref 0.00–0.07)
Basophils Absolute: 0 10*3/uL (ref 0.0–0.1)
Basophils Relative: 1 %
Eosinophils Absolute: 0.1 10*3/uL (ref 0.0–0.5)
Eosinophils Relative: 2 %
HCT: 35.3 % — ABNORMAL LOW (ref 36.0–46.0)
Hemoglobin: 12 g/dL (ref 12.0–15.0)
Immature Granulocytes: 0 %
Lymphocytes Relative: 30 %
Lymphs Abs: 1.9 10*3/uL (ref 0.7–4.0)
MCH: 36.9 pg — ABNORMAL HIGH (ref 26.0–34.0)
MCHC: 34 g/dL (ref 30.0–36.0)
MCV: 108.6 fL — ABNORMAL HIGH (ref 80.0–100.0)
Monocytes Absolute: 0.8 10*3/uL (ref 0.1–1.0)
Monocytes Relative: 13 %
Neutro Abs: 3.3 10*3/uL (ref 1.7–7.7)
Neutrophils Relative %: 54 %
Platelet Count: 214 10*3/uL (ref 150–400)
RBC: 3.25 MIL/uL — ABNORMAL LOW (ref 3.87–5.11)
RDW: 15.1 % (ref 11.5–15.5)
WBC Count: 6.2 10*3/uL (ref 4.0–10.5)
nRBC: 0 % (ref 0.0–0.2)

## 2022-05-21 LAB — IRON AND IRON BINDING CAPACITY (CC-WL,HP ONLY)
Iron: 77 ug/dL (ref 28–170)
Saturation Ratios: 19 % (ref 10.4–31.8)
TIBC: 405 ug/dL (ref 250–450)
UIBC: 328 ug/dL (ref 148–442)

## 2022-05-21 MED ORDER — SODIUM CHLORIDE 0.9 % IV SOLN: Freq: Once | INTRAVENOUS | Status: AC

## 2022-05-21 MED ORDER — SODIUM CHLORIDE 0.9 % IV SOLN
200.0000 mg | Freq: Once | INTRAVENOUS | Status: AC
  Administered 2022-05-21: 200 mg via INTRAVENOUS
  Filled 2022-05-21: qty 8

## 2022-05-21 MED ORDER — PROCHLORPERAZINE MALEATE 10 MG PO TABS
10.0000 mg | ORAL_TABLET | Freq: Once | ORAL | Status: AC
  Administered 2022-05-21: 10 mg via ORAL
  Filled 2022-05-21: qty 1

## 2022-05-21 MED ORDER — HEPARIN SOD (PORK) LOCK FLUSH 100 UNIT/ML IV SOLN
500.0000 [IU] | Freq: Once | INTRAVENOUS | Status: AC | PRN
  Administered 2022-05-21: 500 [IU]

## 2022-05-21 MED ORDER — SODIUM CHLORIDE 0.9% FLUSH
10.0000 mL | INTRAVENOUS | Status: AC | PRN
  Administered 2022-05-21: 10 mL

## 2022-05-21 MED ORDER — SODIUM CHLORIDE 0.9 % IV SOLN
400.0000 mg/m2 | Freq: Once | INTRAVENOUS | Status: AC
  Administered 2022-05-21: 700 mg via INTRAVENOUS
  Filled 2022-05-21: qty 20

## 2022-05-21 MED ORDER — SODIUM CHLORIDE 0.9% FLUSH
10.0000 mL | INTRAVENOUS | Status: DC | PRN
  Administered 2022-05-21: 10 mL

## 2022-05-21 NOTE — Patient Instructions (Signed)
Steps to Quit Smoking Smoking tobacco is the leading cause of preventable death. It can affect almost every organ in the body. Smoking puts you and people around you at risk for many serious, long-lasting (chronic) diseases. Quitting smoking can be hard, but it is one of the best things that you can do for your health. It is never too late to quit. Do not give up if you cannot quit the first time. Some people need to try many times to quit. Do your best to stick to your quit plan, and talk with your doctor if you have any questions or concerns. How do I get ready to quit? Pick a date to quit. Set a date within the next 2 weeks to give you time to prepare. Write down the reasons why you are quitting. Keep this list in places where you will see it often. Tell your family, friends, and co-workers that you are quitting. Their support is important. Talk with your doctor about the choices that may help you quit. Find out if your health insurance will pay for these treatments. Know the people, places, things, and activities that make you want to smoke (triggers). Avoid them. What first steps can I take to quit smoking? Throw away all cigarettes at home, at work, and in your car. Throw away the things that you use when you smoke, such as ashtrays and lighters. Clean your car. Empty the ashtray. Clean your home, including curtains and carpets. What can I do to help me quit smoking? Talk with your doctor about taking medicines and seeing a counselor. You are more likely to succeed when you do both. If you are pregnant or breastfeeding: Talk with your doctor about counseling or other ways to quit smoking. Do not take medicine to help you quit smoking unless your doctor tells you to. Quit right away Quit smoking completely, instead of slowly cutting back on how much you smoke over a period of time. Stopping smoking right away may be more successful than slowly quitting. Go to counseling. In-person is best  if this is an option. You are more likely to quit if you go to counseling sessions regularly. Take medicine You may take medicines to help you quit. Some medicines need a prescription, and some you can buy over-the-counter. Some medicines may contain a drug called nicotine to replace the nicotine in cigarettes. Medicines may: Help you stop having the desire to smoke (cravings). Help to stop the problems that come when you stop smoking (withdrawal symptoms). Your doctor may ask you to use: Nicotine patches, gum, or lozenges. Nicotine inhalers or sprays. Non-nicotine medicine that you take by mouth. Find resources Find resources and other ways to help you quit smoking and remain smoke-free after you quit. They include: Online chats with a counselor. Phone quitlines. Printed self-help materials. Support groups or group counseling. Text messaging programs. Mobile phone apps. Use apps on your mobile phone or tablet that can help you stick to your quit plan. Examples of free services include Quit Guide from the CDC and smokefree.gov  What can I do to make it easier to quit?  Talk to your family and friends. Ask them to support and encourage you. Call a phone quitline, such as 1-800-QUIT-NOW, reach out to support groups, or work with a counselor. Ask people who smoke to not smoke around you. Avoid places that make you want to smoke, such as: Bars. Parties. Smoke-break areas at work. Spend time with people who do not smoke. Lower   the stress in your life. Stress can make you want to smoke. Try these things to lower stress: Getting regular exercise. Doing deep-breathing exercises. Doing yoga. Meditating. What benefits will I see if I quit smoking? Over time, you may have: A better sense of smell and taste. Less coughing and sore throat. A slower heart rate. Lower blood pressure. Clearer skin. Better breathing. Fewer sick days. Summary Quitting smoking can be hard, but it is one of  the best things that you can do for your health. Do not give up if you cannot quit the first time. Some people need to try many times to quit. When you decide to quit smoking, make a plan to help you succeed. Quit smoking right away, not slowly over a period of time. When you start quitting, get help and support to keep you smoke-free. This information is not intended to replace advice given to you by your health care provider. Make sure you discuss any questions you have with your health care provider. Document Revised: 03/29/2021 Document Reviewed: 03/29/2021 [employer] Patient Education  2023 [employer]  
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
## 2022-05-21 NOTE — Patient Instructions (Signed)
[facility]  Discharge Instructions: Thank you for choosing [facility] to provide your oncology and hematology care.   If you have a lab appointment with the [facility], please go directly to the [facility] and check in at the registration area.   Wear comfortable clothing and clothing appropriate for easy access to any Portacath or PICC line.   We strive to give you quality time with your provider. You may need to reschedule your appointment if you arrive late (15 or more minutes).  Arriving late affects you and other patients whose appointments are after yours.  Also, if you miss three or more appointments without notifying the office, you may be dismissed from the clinic at the provider's discretion.      For prescription refill requests, have your pharmacy contact our office and allow 72 hours for refills to be completed.    Today you received the following chemotherapy and/or immunotherapy agents: Keytruda, Alimta      To help prevent nausea and vomiting after your treatment, we encourage you to take your nausea medication as directed.  BELOW ARE SYMPTOMS THAT SHOULD BE REPORTED IMMEDIATELY: *FEVER GREATER THAN 100.4 F (38 C) OR HIGHER *CHILLS OR SWEATING *NAUSEA AND VOMITING THAT IS NOT CONTROLLED WITH YOUR NAUSEA MEDICATION *UNUSUAL SHORTNESS OF BREATH *UNUSUAL BRUISING OR BLEEDING *URINARY PROBLEMS (pain or burning when urinating, or frequent urination) *BOWEL PROBLEMS (unusual diarrhea, constipation, pain near the anus) TENDERNESS IN MOUTH AND THROAT WITH OR WITHOUT PRESENCE OF ULCERS (sore throat, sores in mouth, or a toothache) UNUSUAL RASH, SWELLING OR PAIN  UNUSUAL VAGINAL DISCHARGE OR ITCHING   Items with * indicate a potential emergency and should be followed up as soon as possible or go to the Emergency Department if any problems should occur.  Please show the CHEMOTHERAPY ALERT CARD or IMMUNOTHERAPY ALERT CARD  at check-in to the Emergency Department and triage nurse.  Should you have questions after your visit or need to cancel or reschedule your appointment, please contact [facility]  Dept: [phone]  and follow the prompts.  Office hours are 8:00 a.m. to 4:30 p.m. Monday - Friday. Please note that voicemails left after 4:00 p.m. may not be returned until the following business day.  We are closed weekends and major holidays. You have access to a nurse at all times for urgent questions. Please call the main number to the clinic Dept: [phone] and follow the prompts.   For any non-urgent questions, you may also contact your provider using MyChart. We now offer e-Visits for anyone 24 and older to request care online for non-urgent symptoms. For details visit [email or web address].   Also download the MyChart app! Go to the app store, search "MyChart", open the app, select [facility], and log in with your MyChart username and password.

## 2022-05-22 ENCOUNTER — Encounter: Payer: Self-pay | Admitting: Internal Medicine

## 2022-05-22 ENCOUNTER — Encounter: Payer: Self-pay | Admitting: Physician Assistant

## 2022-05-22 NOTE — Telephone Encounter (Signed)
Chart Review.  No changes made
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
## 2022-05-26 ENCOUNTER — Encounter (INDEPENDENT_AMBULATORY_CARE_PROVIDER_SITE_OTHER): Payer: Self-pay | Admitting: Ophthalmology

## 2022-05-26 ENCOUNTER — Ambulatory Visit (INDEPENDENT_AMBULATORY_CARE_PROVIDER_SITE_OTHER): Payer: Medicare Other | Admitting: Ophthalmology

## 2022-05-26 DIAGNOSIS — H35711 Central serous chorioretinopathy, right eye: Secondary | ICD-10-CM

## 2022-05-26 DIAGNOSIS — Z961 Presence of intraocular lens: Secondary | ICD-10-CM

## 2022-05-26 DIAGNOSIS — C3491 Malignant neoplasm of unspecified part of right bronchus or lung: Secondary | ICD-10-CM

## 2022-05-29 ENCOUNTER — Other Ambulatory Visit (INDEPENDENT_AMBULATORY_CARE_PROVIDER_SITE_OTHER): Payer: Self-pay | Admitting: Gastroenterology

## 2022-06-04 ENCOUNTER — Ambulatory Visit (INDEPENDENT_AMBULATORY_CARE_PROVIDER_SITE_OTHER): Payer: Medicare Other | Admitting: Family Medicine

## 2022-06-04 VITALS — BP 148/91 | HR 89 | Temp 98.2°F | Ht 60.0 in | Wt 159.0 lb

## 2022-06-04 DIAGNOSIS — I700 Atherosclerosis of aorta: Secondary | ICD-10-CM | POA: Diagnosis not present

## 2022-06-04 DIAGNOSIS — R0602 Shortness of breath: Secondary | ICD-10-CM

## 2022-06-04 DIAGNOSIS — J449 Chronic obstructive pulmonary disease, unspecified: Secondary | ICD-10-CM | POA: Insufficient documentation

## 2022-06-04 DIAGNOSIS — C3491 Malignant neoplasm of unspecified part of right bronchus or lung: Secondary | ICD-10-CM | POA: Diagnosis not present

## 2022-06-04 DIAGNOSIS — R21 Rash and other nonspecific skin eruption: Secondary | ICD-10-CM

## 2022-06-04 DIAGNOSIS — Z780 Asymptomatic menopausal state: Secondary | ICD-10-CM

## 2022-06-04 DIAGNOSIS — R030 Elevated blood-pressure reading, without diagnosis of hypertension: Secondary | ICD-10-CM

## 2022-06-04 DIAGNOSIS — Z23 Encounter for immunization: Secondary | ICD-10-CM

## 2022-06-04 DIAGNOSIS — Z720 Tobacco use: Secondary | ICD-10-CM

## 2022-06-04 DIAGNOSIS — R600 Localized edema: Secondary | ICD-10-CM

## 2022-06-04 DIAGNOSIS — J439 Emphysema, unspecified: Secondary | ICD-10-CM

## 2022-06-04 MED ORDER — POTASSIUM CHLORIDE CRYS ER 10 MEQ PO TBCR: 10.0000 meq | EXTENDED_RELEASE_TABLET | Freq: Every day | ORAL | 5 refills | Status: DC

## 2022-06-04 MED ORDER — FUROSEMIDE 20 MG PO TABS: 20.0000 mg | ORAL_TABLET | Freq: Every day | ORAL | 5 refills | Status: DC

## 2022-06-04 MED ORDER — TRIAMCINOLONE ACETONIDE 0.5 % EX OINT: 1.0000 | TOPICAL_OINTMENT | Freq: Two times a day (BID) | CUTANEOUS | 0 refills | Status: DC

## 2022-06-04 MED ORDER — NICOTINE 21 MG/24HR TD PT24: 21.0000 mg | MEDICATED_PATCH | Freq: Every day | TRANSDERMAL | 6 refills | Status: DC

## 2022-06-04 NOTE — Assessment & Plan Note (Signed)
Lasix as directed.  Patient will follow-up in 1 month.

## 2022-06-04 NOTE — Patient Instructions (Addendum)
Lasix daily. Potassium as well.  Topical for the rash.  Dexa scan ordered.  Referral to cardiology placed.   Follow up in 1 month.

## 2022-06-04 NOTE — Assessment & Plan Note (Signed)
Advised that she needs to quit smoking.  Nicotine patches refilled.

## 2022-06-04 NOTE — Assessment & Plan Note (Signed)
Currently receiving chemotherapy.  Followed closely by oncology.

## 2022-06-04 NOTE — Assessment & Plan Note (Signed)
Advise use of compression stockings.  Starting on Lasix which should help her blood pressure as well.  Potassium as well (with Lasix).

## 2022-06-04 NOTE — Progress Notes (Signed)
Subjective:  Patient ID: Yvette Curry, female    DOB: May 03, 1955  Age: 67 y.o. MRN: [id number]  CC: Chief Complaint  Patient presents with   Establish Care    Undergoing cancer treatment Elevated bBP and foot swelling    HPI:  67 year old female who has stage IV lung cancer presents to establish care with me.  Blood pressures have been mildly elevated.  She has ongoing lower extremity edema as well.  She has been using Lasix as needed.  We will discuss this today.  Patient continues to smoke approximately 1/2 pack/day.  She has tried Wellbutrin and Chantix in the past.  She is currently trying nicotine replacement.  I have advised that she needs to find a way to quit smoking.  Patient has an ongoing rash of her upper chest.  She has been using over-the-counter hydrocortisone.  She would like me to examine this today.  Additionally, patient is concerned about underlying heart disease.  She has a family history of heart disease.  Based on her CT imaging, she has aortic atherosclerosis.  I suspect that she has at least some component of coronary artery disease given that she is a smoker.  She is currently having shortness of breath particular with exertion.  CT imaging has also revealed findings of emphysema.  In regards to her preventative health care, patient is in need of a DEXA scan given her age.  We discussed pneumonia vaccine and she is amenable to getting this today.  She declines HIV and hepatitis C screening.  We also discussed shingles vaccination.  Patient Active Problem List   Diagnosis Date Noted   Aortic atherosclerosis ([place]) 06/04/2022   Emphysema lung ([place]) 06/04/2022   Lower extremity edema 06/04/2022   SOB (shortness of breath) 06/04/2022   Rash 06/04/2022   Elevated BP without diagnosis of hypertension 06/04/2022   Neutropenia ([place]) 03/27/2022   Encounter for antineoplastic chemotherapy 03/19/2022   Adenocarcinoma of right lung, stage 4 ([place]) 12/12/2021    Tobacco abuse 11/06/2014    Social Hx   Social History   Socioeconomic History   Marital status: Married    Spouse name: Tyrone Nine   Number of children: Y   Years of education: Not on file   Highest education level: Not on file  Occupational History   Occupation: Scientist, research (medical): UNEMPLOYED  Tobacco Use   Smoking status: Every Day    Packs/day: 1.50    Years: 37.00    Total pack years: 55.50    Types: Cigarettes    Passive exposure: Current   Smokeless tobacco: Never  Vaping Use   Vaping Use: Former  Substance and Sexual Activity   Alcohol use: No   Drug use: No   Sexual activity: Yes    Comment: hysterectomy  Other Topics Concern   Not on file  Social History Narrative   Not on file   Social Determinants of Health   Financial Resource Strain: Not on file  Food Insecurity: Not on file  Transportation Needs: Not on file  Physical Activity: Not on file  Stress: Not on file  Social Connections: Not on file    Review of Systems Per HPI  Objective:  BP (!) 148/91   Pulse 89   Temp 98.2 F (36.8 C)   Ht 5' (1.524 m)   Wt 159 lb (72.1 kg)   SpO2 99%   BMI 31.05 kg/m      06/04/2022    8:42 AM 06/04/2022  8:28 AM 05/21/2022   12:13 PM  BP/Weight  Systolic BP [phone]  Diastolic BP 91 94 71  Wt. (Lbs)  159   BMI  31.05 kg/m2     Physical Exam Constitutional:      General: She is not in acute distress.    Appearance: Normal appearance.  HENT:     Head: Normocephalic and atraumatic.     Nose: Nose normal.  Eyes:     General:        Right eye: No discharge.        Left eye: No discharge.     Conjunctiva/sclera: Conjunctivae normal.  Cardiovascular:     Rate and Rhythm: Normal rate and regular rhythm.  Pulmonary:     Effort: Pulmonary effort is normal.     Breath sounds: No wheezing.  Abdominal:     General: There is no distension.     Palpations: Abdomen is soft.     Tenderness: There is no abdominal tenderness.  Musculoskeletal:      Comments: Mild lower extremity edema.  Neurological:     General: No focal deficit present.     Mental Status: She is alert.  Psychiatric:     Comments: Tearful.     Lab Results  Component Value Date   WBC 6.2 05/21/2022   HGB 12.0 05/21/2022   HCT 35.3 (L) 05/21/2022   PLT 214 05/21/2022   GLUCOSE 92 05/21/2022   ALT 38 05/21/2022   AST 35 05/21/2022   NA 140 05/21/2022   K 4.4 05/21/2022   CL 105 05/21/2022   CREATININE 0.88 05/21/2022   BUN 17 05/21/2022   CO2 30 05/21/2022   TSH 2.053 04/08/2022   INR 0.9 12/10/2021     Assessment & Plan:   Problem List Items Addressed This Visit       Cardiovascular and Mediastinum   Aortic atherosclerosis (HCC)   Relevant Medications   furosemide (LASIX) 20 MG tablet     Respiratory   Emphysema lung (HCC)   Relevant Medications   nicotine (NICODERM CQ) 21 mg/24hr patch   Adenocarcinoma of right lung, stage 4 (HCC)    Currently receiving chemotherapy.  Followed closely by oncology.        Musculoskeletal and Integument   Rash    Treating with triamcinolone.        Other   Tobacco abuse    Advised that she needs to quit smoking.  Nicotine patches refilled.      SOB (shortness of breath) - Primary    Given the fact that she has ongoing shortness of breath and has aortic atherosclerosis, I am referring her to cardiology.      Relevant Orders   Ambulatory referral to Cardiology   Lower extremity edema    Advise use of compression stockings.  Starting on Lasix which should help her blood pressure as well.  Potassium as well (with Lasix).      Elevated BP without diagnosis of hypertension    Lasix as directed.  Patient will follow-up in 1 month.      Other Visit Diagnoses     Postmenopausal       Relevant Orders   DG Bone Density   Immunization due       Relevant Orders   Pneumococcal conjugate vaccine 20-valent (Prevnar 20) (Completed)       Meds ordered this encounter  Medications   nicotine  (NICODERM CQ) 21 mg/24hr patch    Sig: Place  1 patch (21 mg total) onto the skin daily.    Dispense:  28 patch    Refill:  6   furosemide (LASIX) 20 MG tablet    Sig: Take 1 tablet (20 mg total) by mouth daily. Daily only as needed for swelling.    Dispense:  30 tablet    Refill:  5   potassium chloride (KLOR-CON M) 10 MEQ tablet    Sig: Take 1 tablet (10 mEq total) by mouth daily.    Dispense:  30 tablet    Refill:  5   triamcinolone ointment (KENALOG) 0.5 %    Sig: Apply 1 Application topically 2 (two) times daily.    Dispense:  30 g    Refill:  0    Follow-up:  Return in about 1 month (around 07/03/2022).  [place]

## 2022-06-04 NOTE — Assessment & Plan Note (Signed)
Given the fact that she has ongoing shortness of breath and has aortic atherosclerosis, I am referring her to cardiology.

## 2022-06-04 NOTE — Assessment & Plan Note (Signed)
Treating with triamcinolone.

## 2022-06-05 ENCOUNTER — Other Ambulatory Visit: Payer: Self-pay | Admitting: Medical Oncology

## 2022-06-05 ENCOUNTER — Telehealth: Payer: Self-pay | Admitting: Medical Oncology

## 2022-06-05 NOTE — Telephone Encounter (Signed)
New ointment prescribed.  Cardiology referral- PCP is going to refer her to cardiologist.

## 2022-06-08 ENCOUNTER — Other Ambulatory Visit: Payer: Self-pay

## 2022-06-11 ENCOUNTER — Inpatient Hospital Stay: Payer: Medicare Other | Attending: Internal Medicine

## 2022-06-11 ENCOUNTER — Encounter: Payer: Self-pay | Admitting: Medical Oncology

## 2022-06-11 ENCOUNTER — Other Ambulatory Visit: Payer: Self-pay

## 2022-06-11 ENCOUNTER — Inpatient Hospital Stay: Payer: Medicare Other

## 2022-06-11 ENCOUNTER — Inpatient Hospital Stay (HOSPITAL_BASED_OUTPATIENT_CLINIC_OR_DEPARTMENT_OTHER): Payer: Medicare Other | Admitting: Internal Medicine

## 2022-06-11 VITALS — BP 157/98 | HR 94 | Temp 98.4°F | Resp 16 | Wt 160.5 lb

## 2022-06-11 DIAGNOSIS — Z90710 Acquired absence of both cervix and uterus: Secondary | ICD-10-CM | POA: Insufficient documentation

## 2022-06-11 DIAGNOSIS — C3491 Malignant neoplasm of unspecified part of right bronchus or lung: Secondary | ICD-10-CM

## 2022-06-11 DIAGNOSIS — D649 Anemia, unspecified: Secondary | ICD-10-CM | POA: Diagnosis not present

## 2022-06-11 DIAGNOSIS — C778 Secondary and unspecified malignant neoplasm of lymph nodes of multiple regions: Secondary | ICD-10-CM | POA: Insufficient documentation

## 2022-06-11 DIAGNOSIS — Z5112 Encounter for antineoplastic immunotherapy: Secondary | ICD-10-CM | POA: Insufficient documentation

## 2022-06-11 DIAGNOSIS — Z5111 Encounter for antineoplastic chemotherapy: Secondary | ICD-10-CM | POA: Insufficient documentation

## 2022-06-11 DIAGNOSIS — C3411 Malignant neoplasm of upper lobe, right bronchus or lung: Secondary | ICD-10-CM | POA: Diagnosis present

## 2022-06-11 DIAGNOSIS — Z79899 Other long term (current) drug therapy: Secondary | ICD-10-CM | POA: Insufficient documentation

## 2022-06-11 DIAGNOSIS — C3490 Malignant neoplasm of unspecified part of unspecified bronchus or lung: Secondary | ICD-10-CM

## 2022-06-11 DIAGNOSIS — Z90722 Acquired absence of ovaries, bilateral: Secondary | ICD-10-CM | POA: Diagnosis not present

## 2022-06-11 DIAGNOSIS — Z95828 Presence of other vascular implants and grafts: Secondary | ICD-10-CM

## 2022-06-11 LAB — CBC WITH DIFFERENTIAL (CANCER CENTER ONLY)
Abs Immature Granulocytes: 0.03 10*3/uL (ref 0.00–0.07)
Basophils Absolute: 0 10*3/uL (ref 0.0–0.1)
Basophils Relative: 1 %
Eosinophils Absolute: 0.2 10*3/uL (ref 0.0–0.5)
Eosinophils Relative: 2 %
HCT: 36 % (ref 36.0–46.0)
Hemoglobin: 12.3 g/dL (ref 12.0–15.0)
Immature Granulocytes: 0 %
Lymphocytes Relative: 28 %
Lymphs Abs: 2.2 10*3/uL (ref 0.7–4.0)
MCH: 36.5 pg — ABNORMAL HIGH (ref 26.0–34.0)
MCHC: 34.2 g/dL (ref 30.0–36.0)
MCV: 106.8 fL — ABNORMAL HIGH (ref 80.0–100.0)
Monocytes Absolute: 1 10*3/uL (ref 0.1–1.0)
Monocytes Relative: 12 %
Neutro Abs: 4.3 10*3/uL (ref 1.7–7.7)
Neutrophils Relative %: 57 %
Platelet Count: 232 10*3/uL (ref 150–400)
RBC: 3.37 MIL/uL — ABNORMAL LOW (ref 3.87–5.11)
RDW: 14.7 % (ref 11.5–15.5)
WBC Count: 7.7 10*3/uL (ref 4.0–10.5)
nRBC: 0 % (ref 0.0–0.2)

## 2022-06-11 LAB — CMP (CANCER CENTER ONLY)
ALT: 48 U/L — ABNORMAL HIGH (ref 0–44)
AST: 39 U/L (ref 15–41)
Albumin: 4.1 g/dL (ref 3.5–5.0)
Alkaline Phosphatase: 82 U/L (ref 38–126)
Anion gap: 7 (ref 5–15)
BUN: 19 mg/dL (ref 8–23)
CO2: 30 mmol/L (ref 22–32)
Calcium: 9.8 mg/dL (ref 8.9–10.3)
Chloride: 104 mmol/L (ref 98–111)
Creatinine: 0.93 mg/dL (ref 0.44–1.00)
GFR, Estimated: >60 mL/min (ref >60)
Glucose, Bld: 98 mg/dL (ref 70–99)
Potassium: 4 mmol/L (ref 3.5–5.1)
Sodium: 141 mmol/L (ref 135–145)
Total Bilirubin: 0.2 mg/dL — ABNORMAL LOW (ref 0.3–1.2)
Total Protein: 7 g/dL (ref 6.5–8.1)

## 2022-06-11 LAB — TSH: TSH: 2.357 u[IU]/mL (ref 0.350–4.500)

## 2022-06-11 MED ORDER — SODIUM CHLORIDE 0.9 % IV SOLN: Freq: Once | INTRAVENOUS | Status: AC

## 2022-06-11 MED ORDER — SODIUM CHLORIDE 0.9 % IV SOLN
400.0000 mg/m2 | Freq: Once | INTRAVENOUS | Status: AC
  Administered 2022-06-11: 700 mg via INTRAVENOUS
  Filled 2022-06-11: qty 20

## 2022-06-11 MED ORDER — PROCHLORPERAZINE MALEATE 10 MG PO TABS
10.0000 mg | ORAL_TABLET | Freq: Once | ORAL | Status: AC
  Administered 2022-06-11: 10 mg via ORAL
  Filled 2022-06-11: qty 1

## 2022-06-11 MED ORDER — CYANOCOBALAMIN 1000 MCG/ML IJ SOLN
1000.0000 ug | Freq: Once | INTRAMUSCULAR | Status: AC
  Administered 2022-06-11: 1000 ug via INTRAMUSCULAR
  Filled 2022-06-11: qty 1

## 2022-06-11 MED ORDER — SODIUM CHLORIDE 0.9 % IV SOLN
200.0000 mg | Freq: Once | INTRAVENOUS | Status: AC
  Administered 2022-06-11: 200 mg via INTRAVENOUS
  Filled 2022-06-11: qty 8

## 2022-06-11 MED ORDER — LORAZEPAM 1 MG PO TABS: ORAL_TABLET | ORAL | 0 refills | Status: DC

## 2022-06-11 MED ORDER — SODIUM CHLORIDE 0.9% FLUSH
10.0000 mL | INTRAVENOUS | Status: AC | PRN
  Administered 2022-06-11: 10 mL

## 2022-06-11 MED ORDER — HEPARIN SOD (PORK) LOCK FLUSH 100 UNIT/ML IV SOLN
500.0000 [IU] | Freq: Once | INTRAVENOUS | Status: AC | PRN
  Administered 2022-06-11: 500 [IU]

## 2022-06-11 MED ORDER — SODIUM CHLORIDE 0.9% FLUSH
10.0000 mL | INTRAVENOUS | Status: DC | PRN
  Administered 2022-06-11: 10 mL
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
## 2022-06-11 NOTE — Patient Instructions (Signed)
[facility]  Discharge Instructions: Thank you for choosing [facility] to provide your oncology and hematology care.   If you have a lab appointment with the [facility], please go directly to the [facility] and check in at the registration area.   Wear comfortable clothing and clothing appropriate for easy access to any Portacath or PICC line.   We strive to give you quality time with your provider. You may need to reschedule your appointment if you arrive late (15 or more minutes).  Arriving late affects you and other patients whose appointments are after yours.  Also, if you miss three or more appointments without notifying the office, you may be dismissed from the clinic at the provider's discretion.      For prescription refill requests, have your pharmacy contact our office and allow 72 hours for refills to be completed.    Today you received the following chemotherapy and/or immunotherapy agents Keytruda/Alimta      To help prevent nausea and vomiting after your treatment, we encourage you to take your nausea medication as directed.  BELOW ARE SYMPTOMS THAT SHOULD BE REPORTED IMMEDIATELY: *FEVER GREATER THAN 100.4 F (38 C) OR HIGHER *CHILLS OR SWEATING *NAUSEA AND VOMITING THAT IS NOT CONTROLLED WITH YOUR NAUSEA MEDICATION *UNUSUAL SHORTNESS OF BREATH *UNUSUAL BRUISING OR BLEEDING *URINARY PROBLEMS (pain or burning when urinating, or frequent urination) *BOWEL PROBLEMS (unusual diarrhea, constipation, pain near the anus) TENDERNESS IN MOUTH AND THROAT WITH OR WITHOUT PRESENCE OF ULCERS (sore throat, sores in mouth, or a toothache) UNUSUAL RASH, SWELLING OR PAIN  UNUSUAL VAGINAL DISCHARGE OR ITCHING   Items with * indicate a potential emergency and should be followed up as soon as possible or go to the Emergency Department if any problems should occur.  Please show the CHEMOTHERAPY ALERT CARD or IMMUNOTHERAPY ALERT CARD at  check-in to the Emergency Department and triage nurse.  Should you have questions after your visit or need to cancel or reschedule your appointment, please contact [facility]  Dept: [phone]  and follow the prompts.  Office hours are 8:00 a.m. to 4:30 p.m. Monday - Friday. Please note that voicemails left after 4:00 p.m. may not be returned until the following business day.  We are closed weekends and major holidays. You have access to a nurse at all times for urgent questions. Please call the main number to the clinic Dept: [phone] and follow the prompts.   For any non-urgent questions, you may also contact your provider using MyChart. We now offer e-Visits for anyone 51 and older to request care online for non-urgent symptoms. For details visit [email or web address].   Also download the MyChart app! Go to the app store, search "MyChart", open the app, select [facility], and log in with your MyChart username and password.

## 2022-06-11 NOTE — Progress Notes (Signed)
Patient seen by MD today  Vitals are within treatment parameters.  Labs reviewed: and are within treatment parameters.  Per physician team, patient is ready for treatment and there are NO modifications to the treatment plan.

## 2022-06-12 ENCOUNTER — Telehealth: Payer: Self-pay | Admitting: Medical Oncology

## 2022-06-12 ENCOUNTER — Telehealth: Payer: Self-pay | Admitting: Family Medicine

## 2022-06-12 ENCOUNTER — Other Ambulatory Visit: Payer: Self-pay | Admitting: Medical Oncology

## 2022-06-12 DIAGNOSIS — Z79899 Other long term (current) drug therapy: Secondary | ICD-10-CM

## 2022-06-12 NOTE — Telephone Encounter (Signed)
Patient is still having issues with swelling in her legs . She states using her compression sock ans elevating her legs but not helping . She is wanting to know if she soul double her Lasix 20 mg for a few weeks to see if that would help. She states her mother had  swelling issues  also she forgot to let you  know. Please advise

## 2022-06-12 NOTE — Telephone Encounter (Signed)
Reviewed order for scans.

## 2022-06-12 NOTE — Telephone Encounter (Signed)
Please advise. Thank you

## 2022-06-13 LAB — T4: T4, Total: 9.3 ug/dL (ref 4.5–12.0)

## 2022-06-13 NOTE — Telephone Encounter (Signed)
Pt contacted and verbalized understanding. Lab order placed.

## 2022-06-16 ENCOUNTER — Ambulatory Visit (HOSPITAL_COMMUNITY)
Admission: RE | Admit: 2022-06-16 | Discharge: 2022-06-16 | Disposition: A | Discharge status: Home / Self Care | Payer: Medicare Other | Source: Ambulatory Visit | Attending: Family Medicine | Admitting: Family Medicine

## 2022-06-16 DIAGNOSIS — Z780 Asymptomatic menopausal state: Secondary | ICD-10-CM | POA: Insufficient documentation

## 2022-06-18 ENCOUNTER — Other Ambulatory Visit: Payer: Self-pay

## 2022-06-19 ENCOUNTER — Telehealth: Payer: Self-pay

## 2022-06-19 NOTE — Telephone Encounter (Signed)
Error

## 2022-06-20 ENCOUNTER — Ambulatory Visit (INDEPENDENT_AMBULATORY_CARE_PROVIDER_SITE_OTHER): Payer: Medicare Other | Admitting: Family Medicine

## 2022-06-20 ENCOUNTER — Telehealth: Payer: Self-pay

## 2022-06-20 VITALS — BP 137/92 | HR 92 | Temp 97.3°F | Ht 60.0 in | Wt 156.0 lb

## 2022-06-20 DIAGNOSIS — E559 Vitamin D deficiency, unspecified: Secondary | ICD-10-CM

## 2022-06-20 DIAGNOSIS — I10 Essential (primary) hypertension: Secondary | ICD-10-CM

## 2022-06-20 DIAGNOSIS — R600 Localized edema: Secondary | ICD-10-CM

## 2022-06-20 MED ORDER — HYDROCHLOROTHIAZIDE 25 MG PO TABS: 25.0000 mg | ORAL_TABLET | Freq: Every day | ORAL | 1 refills | Status: DC

## 2022-06-20 NOTE — Assessment & Plan Note (Signed)
Stopping Lasix.  Starting HCTZ.

## 2022-06-20 NOTE — Patient Instructions (Signed)
Stop lasix. Start HCTZ.  Labs before next visit.  I have placed referral to vascular.  Compression and elevation.

## 2022-06-20 NOTE — Progress Notes (Signed)
Subjective:  Patient ID: Yvette Curry, female    DOB: 29-Nov-1955  Age: 67 y.o. MRN: [id number]  CC: Lower extremity edema  HPI:  67 year old female with aortic atherosclerosis, emphysema, adenocarcinoma of lung (stage IV), tobacco abuse, hypertension presents for evaluation of the above.  Patient reports that she has not had a significant improvement in her lower extremity edema.  In fact, she feels like it is worsening.  She is now having areas of hyperpigmentation of the lower extremities.  She reports significant pain.  She has been using Lasix and using compression stockings without significant resolution.  I have recently increased her dose of Lasix after she requested an increase and she has still not had any significant improvement.  Of note, patient does have some evidence of venous insufficiency and is on pembrolizumab which is known to cause lower extremity edema.  Patient states that she has shortness of breath as well.  Cardiology referral is in place.  She has an upcoming appointment next week.  Patient Active Problem List   Diagnosis Date Noted   Essential hypertension 06/20/2022   Aortic atherosclerosis ([facility]) 06/04/2022   Emphysema lung ([facility]) 06/04/2022   Lower extremity edema 06/04/2022   SOB (shortness of breath) 06/04/2022   Rash 06/04/2022   Neutropenia ([facility]) 03/27/2022   Encounter for antineoplastic chemotherapy 03/19/2022   Adenocarcinoma of right lung, stage 4 ([facility]) 12/12/2021   Tobacco abuse 11/06/2014    Social Hx   Social History   Socioeconomic History   Marital status: Married    Spouse name: Tyrone Nine   Number of children: Y   Years of education: Not on file   Highest education level: Not on file  Occupational History   Occupation: Scientist, research (medical): UNEMPLOYED  Tobacco Use   Smoking status: Every Day    Packs/day: 1.50    Years: 37.00    Total pack years: 55.50    Types: Cigarettes    Passive exposure: Current   Smokeless tobacco: Never   Vaping Use   Vaping Use: Former  Substance and Sexual Activity   Alcohol use: No   Drug use: No   Sexual activity: Yes    Comment: hysterectomy  Other Topics Concern   Not on file  Social History Narrative   Not on file   Social Determinants of Health   Financial Resource Strain: Not on file  Food Insecurity: Not on file  Transportation Needs: Not on file  Physical Activity: Not on file  Stress: Not on file  Social Connections: Not on file    Review of Systems Per HPI  Objective:  BP (!) 137/92   Pulse 92   Temp (!) 97.3 F (36.3 C)   Ht 5' (1.524 m)   Wt 156 lb (70.8 kg)   SpO2 97%   BMI 30.47 kg/m      06/20/2022   10:01 AM 06/11/2022    9:12 AM 06/04/2022    8:42 AM  BP/Weight  Systolic BP 0000000 [id number]  Diastolic BP 92 98 91  Wt. (Lbs) 156 160.5   BMI 30.47 kg/m2 31.35 kg/m2     Physical Exam Vitals and nursing note reviewed.  Constitutional:      General: She is not in acute distress.    Appearance: Normal appearance.  HENT:     Head: Normocephalic and atraumatic.  Cardiovascular:     Rate and Rhythm: Normal rate and regular rhythm.  Pulmonary:     Effort: Pulmonary effort  is normal.     Breath sounds: No wheezing.  Musculoskeletal:     Comments: Lower extremity edema noted, trace to 1+.  Lower extremities are exquisitely tender to palpation.  Hyperpigmentation noted as well.  Neurological:     Mental Status: She is alert.  Psychiatric:        Mood and Affect: Mood normal.        Behavior: Behavior normal.     Lab Results  Component Value Date   WBC 7.7 06/11/2022   HGB 12.3 06/11/2022   HCT 36.0 06/11/2022   PLT 232 06/11/2022   GLUCOSE 98 06/11/2022   ALT 48 (H) 06/11/2022   AST 39 06/11/2022   NA 141 06/11/2022   K 4.0 06/11/2022   CL 104 06/11/2022   CREATININE 0.93 06/11/2022   BUN 19 06/11/2022   CO2 30 06/11/2022   TSH 2.357 06/11/2022   INR 0.9 12/10/2021     Assessment & Plan:   Problem List Items Addressed This  Visit       Cardiovascular and Mediastinum   Essential hypertension    Stopping Lasix.  Starting HCTZ.      Relevant Medications   hydrochlorothiazide (HYDRODIURIL) 25 MG tablet     Other   Lower extremity edema - Primary    Venous stasis is a likely culprit in addition to side effect from pembrolizumab. Patient wishes to try another diuretic.  Stopping furosemide.  Starting HCTZ. Referring to vascular surgery to see if they have anything else to offer to aid her. Metabolic panel before next appointment.      Relevant Orders   Ambulatory referral to Vascular Surgery   Other Visit Diagnoses     Vitamin D deficiency       Relevant Orders   Vitamin D, 25-hydroxy       Meds ordered this encounter  Medications   hydrochlorothiazide (HYDRODIURIL) 25 MG tablet    Sig: Take 1 tablet (25 mg total) by mouth daily.    Dispense:  90 tablet    Refill:  1    Follow-up: Has upcoming appointment with me.  [name]

## 2022-06-20 NOTE — Assessment & Plan Note (Signed)
Venous stasis is a likely culprit in addition to side effect from pembrolizumab. Patient wishes to try another diuretic.  Stopping furosemide.  Starting HCTZ. Referring to vascular surgery to see if they have anything else to offer to aid her. Metabolic panel before next appointment.

## 2022-06-20 NOTE — Telephone Encounter (Signed)
This nurse spoke with patient who stated that she wanted to give an update.  Patient is still having swelling in bilateral lower extremities even though she has been elevating her legs, wearing compression socks and decreased sodium intake. Patient states that she started having redness going around her ankles and it has become painful to walk at times.  Patient went to see her primary care physician who feels that her symptoms are related to Pembrolizumab and that she may also have some vascular issue.  He has referred this patient to see [facility].  He has switched patient from Lasix to Hydrochlorothiazide. Patient also has a cardiology evaluation coming up. This nurse assured her that the providers will be made aware.  No further questions or concerns noted at this time.

## 2022-06-21 ENCOUNTER — Other Ambulatory Visit: Payer: Self-pay

## 2022-06-23 ENCOUNTER — Encounter: Payer: Self-pay | Admitting: Physician Assistant

## 2022-06-23 ENCOUNTER — Encounter: Payer: Self-pay | Admitting: Internal Medicine

## 2022-06-24 ENCOUNTER — Other Ambulatory Visit: Payer: Self-pay

## 2022-06-24 DIAGNOSIS — R600 Localized edema: Secondary | ICD-10-CM
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
## 2022-06-26 ENCOUNTER — Encounter (HOSPITAL_COMMUNITY): Payer: Self-pay

## 2022-06-26 ENCOUNTER — Other Ambulatory Visit: Payer: Self-pay | Admitting: Internal Medicine

## 2022-06-26 ENCOUNTER — Ambulatory Visit (INDEPENDENT_AMBULATORY_CARE_PROVIDER_SITE_OTHER): Payer: Medicare Other | Admitting: Internal Medicine

## 2022-06-26 ENCOUNTER — Encounter: Payer: Self-pay | Admitting: Internal Medicine

## 2022-06-26 ENCOUNTER — Ambulatory Visit (HOSPITAL_COMMUNITY)
Admission: RE | Admit: 2022-06-26 | Discharge: 2022-06-26 | Disposition: A | Discharge status: Home / Self Care | Payer: Medicare Other | Source: Ambulatory Visit | Attending: Family Medicine | Admitting: Family Medicine

## 2022-06-26 VITALS — BP 100/69 | HR 91 | Ht 64.0 in | Wt 158.4 lb

## 2022-06-26 DIAGNOSIS — R600 Localized edema: Secondary | ICD-10-CM

## 2022-06-26 DIAGNOSIS — C3490 Malignant neoplasm of unspecified part of unspecified bronchus or lung: Secondary | ICD-10-CM | POA: Insufficient documentation

## 2022-06-26 DIAGNOSIS — R0602 Shortness of breath: Secondary | ICD-10-CM | POA: Diagnosis not present

## 2022-06-26 DIAGNOSIS — R42 Dizziness and giddiness: Secondary | ICD-10-CM | POA: Diagnosis not present
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
## 2022-06-26 NOTE — Patient Instructions (Addendum)
Medication Instructions:  Your physician recommends that you continue on your current medications as directed. Please refer to the Current Medication list given to you today.  Labwork: none  Testing/Procedures: Your physician has requested that you have an echocardiogram. Echocardiography is a painless test that uses sound waves to create images of your heart. It provides your doctor with information about the size and shape of your heart and how well your heart's chambers and valves are working. This procedure takes approximately one hour. There are no restrictions for this procedure. Please do NOT wear cologne, perfume, aftershave, or lotions (deodorant is allowed). Please arrive 15 minutes prior to your appointment time.  Follow-Up: Your physician recommends that you schedule a follow-up appointment in: as needed  Any Other Special Instructions Will Be Listed Below (If Applicable).  If you need a refill on your cardiac medications before your next appointment, please call your pharmacy.

## 2022-06-27 ENCOUNTER — Encounter: Payer: Self-pay | Admitting: Internal Medicine

## 2022-06-27 ENCOUNTER — Ambulatory Visit (HOSPITAL_BASED_OUTPATIENT_CLINIC_OR_DEPARTMENT_OTHER): Payer: Medicare Other

## 2022-06-27 ENCOUNTER — Ambulatory Visit (HOSPITAL_COMMUNITY)
Admission: RE | Admit: 2022-06-27 | Discharge: 2022-06-27 | Disposition: A | Discharge status: Home / Self Care | Payer: Medicare Other | Source: Ambulatory Visit | Attending: Internal Medicine | Admitting: Internal Medicine

## 2022-06-27 ENCOUNTER — Encounter (HOSPITAL_COMMUNITY): Payer: Self-pay

## 2022-06-27 DIAGNOSIS — C3490 Malignant neoplasm of unspecified part of unspecified bronchus or lung: Secondary | ICD-10-CM

## 2022-06-27 DIAGNOSIS — R42 Dizziness and giddiness: Secondary | ICD-10-CM | POA: Diagnosis present

## 2022-06-27 DIAGNOSIS — R600 Localized edema: Secondary | ICD-10-CM | POA: Diagnosis present

## 2022-06-27 DIAGNOSIS — R0602 Shortness of breath: Secondary | ICD-10-CM

## 2022-06-27 LAB — ECHOCARDIOGRAM COMPLETE
Area-P 1/2: 4.68 cm2
P 1/2 time: 752 msec
S' Lateral: 3.7 cm

## 2022-06-27 LAB — BASIC METABOLIC PANEL
BUN/Creatinine Ratio: 14 (ref 12–28)
BUN: 14 mg/dL (ref 8–27)
CO2: 27 mmol/L (ref 20–29)
Calcium: 9.6 mg/dL (ref 8.7–10.3)
Chloride: 101 mmol/L (ref 96–106)
Creatinine, Ser: 1.03 mg/dL — ABNORMAL HIGH (ref 0.57–1.00)
Glucose: 95 mg/dL (ref 70–99)
Potassium: 4 mmol/L (ref 3.5–5.2)
Sodium: 145 mmol/L — ABNORMAL HIGH (ref 134–144)
eGFR: 60 mL/min/{1.73_m2} (ref >59)

## 2022-06-27 LAB — VITAMIN D 25 HYDROXY (VIT D DEFICIENCY, FRACTURES): Vit D, 25-Hydroxy: 30.1 ng/mL (ref 30.0–100.0)

## 2022-06-27 MED ORDER — GADOBUTROL 1 MMOL/ML IV SOLN
7.0000 mL | Freq: Once | INTRAVENOUS | Status: AC | PRN
  Administered 2022-06-27: 7 mL via INTRAVENOUS

## 2022-06-27 MED ORDER — HEPARIN SOD (PORK) LOCK FLUSH 100 UNIT/ML IV SOLN
INTRAVENOUS | Status: AC
  Filled 2022-06-27: qty 5

## 2022-06-27 MED ORDER — IOHEXOL 300 MG/ML  SOLN
100.0000 mL | Freq: Once | INTRAMUSCULAR | Status: AC | PRN
  Administered 2022-06-27: 100 mL via INTRAVENOUS

## 2022-06-27 MED ORDER — HEPARIN SOD (PORK) LOCK FLUSH 100 UNIT/ML IV SOLN
500.0000 [IU] | Freq: Once | INTRAVENOUS | Status: AC
  Administered 2022-06-27: 500 [IU] via INTRAVENOUS

## 2022-06-30 ENCOUNTER — Encounter: Payer: Self-pay | Admitting: Family Medicine

## 2022-06-30 ENCOUNTER — Ambulatory Visit (HOSPITAL_COMMUNITY)
Admission: RE | Admit: 2022-06-30 | Discharge: 2022-06-30 | Disposition: A | Discharge status: Home / Self Care | Payer: Medicare Other | Source: Ambulatory Visit | Attending: Surgery | Admitting: Surgery

## 2022-06-30 ENCOUNTER — Encounter (INDEPENDENT_AMBULATORY_CARE_PROVIDER_SITE_OTHER): Payer: Self-pay | Admitting: Ophthalmology

## 2022-06-30 ENCOUNTER — Ambulatory Visit (INDEPENDENT_AMBULATORY_CARE_PROVIDER_SITE_OTHER): Payer: Medicare Other | Admitting: Ophthalmology

## 2022-06-30 ENCOUNTER — Ambulatory Visit (INDEPENDENT_AMBULATORY_CARE_PROVIDER_SITE_OTHER): Payer: Medicare Other | Admitting: Family Medicine

## 2022-06-30 VITALS — BP 136/89 | HR 94 | Temp 97.9°F | Ht 64.0 in | Wt 159.0 lb

## 2022-06-30 DIAGNOSIS — C3491 Malignant neoplasm of unspecified part of right bronchus or lung: Secondary | ICD-10-CM

## 2022-06-30 DIAGNOSIS — H35711 Central serous chorioretinopathy, right eye: Secondary | ICD-10-CM

## 2022-06-30 DIAGNOSIS — Z961 Presence of intraocular lens: Secondary | ICD-10-CM

## 2022-06-30 DIAGNOSIS — R600 Localized edema: Secondary | ICD-10-CM | POA: Diagnosis present

## 2022-06-30 DIAGNOSIS — I5020 Unspecified systolic (congestive) heart failure: Secondary | ICD-10-CM | POA: Diagnosis not present
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
## 2022-06-30 NOTE — Progress Notes (Addendum)
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 bladder is otherwise unremarkable in appearance. Stomach/Bowel: The appearance of the stomach is normal. There is no pathologic dilatation of small bowel or colon. Normal appendix. Vascular/Lymphatic: Aortic atherosclerosis, without evidence of aneurysm or dissection in the abdominal or pelvic vasculature. Retroaortic left renal vein (normal anatomical variant) incidentally noted. No definite lymphadenopathy noted in the abdomen or pelvis. Reproductive: Status post hysterectomy. Ovaries are not confidently identified may be surgically absent or atrophic. Other: No significant volume of ascites.  No pneumoperitoneum. Musculoskeletal: There are no aggressive appearing lytic or blastic lesions noted in the visualized portions of the skeleton. IMPRESSION: 1. Stable appearance of macrolobulated and spiculated right upper lobe pulmonary  nodule. Continued attention on follow-up studies is recommended to ensure stability and/or regression of this lesion. 2. Slight enlargement of left axillary lymph nodes which were previously hypermetabolic on prior PET-CT concerning for progression of nodal disease. 3. No other definitive signs of progressive or new metastatic disease in the chest, abdomen or pelvis. 4. Tiny pulmonary nodules, as above, nonspecific. Attention on follow-up imaging is recommended. 5. Small amount of gas non dependently in the lumen of the urinary bladder is of uncertain etiology and significance, but is similar to the prior study. In the absence of recent instrumentation, correlation with urinalysis to exclude the possibility of urinary tract infection with gas-forming organisms is recommended. 6. Aortic atherosclerosis, in addition to left main coronary artery disease. Please note that although the presence of coronary artery calcium documents the presence of coronary artery disease, the severity of this disease and any potential stenosis cannot be assessed on this non-gated CT examination. Assessment for potential risk factor modification, dietary therapy or pharmacologic therapy may be warranted, if clinically indicated. 7. Additional incidental findings, as above. Electronically Signed   By: Vinnie Langton M.D.   On: 06/30/2022 08:12   MR Brain W Wo Contrast  Result Date: 06/30/2022 CLINICAL DATA:  67 year female with non-small cell lung cancer status post chemotherapy. No prior brain metastases on MRI last year. Restaging. EXAM: MRI HEAD WITHOUT AND WITH CONTRAST TECHNIQUE: Multiplanar, multiecho pulse sequences of the brain and surrounding structures were obtained without and with intravenous contrast. CONTRAST:  37mL GADAVIST GADOBUTROL 1 MMOL/ML IV SOLN COMPARISON:  Brain MRI 12/02/2021. FINDINGS: Brain: Cerebral volume is stable, within normal limits for age. No restricted diffusion to suggest acute infarction. No  midline shift, mass effect, evidence of mass lesion, ventriculomegaly, extra-axial collection or acute intracranial hemorrhage. Cervicomedullary junction and pituitary are within normal limits. Better quality axial postcontrast imaging today. No abnormal enhancement identified. No dural thickening identified. Mild for age periventricular and other scattered nonspecific  cerebral white matter T2 and FLAIR hyperintensity is stable. No cortical encephalomalacia or chronic cerebral blood products identified. Stable mild to moderate T2 heterogeneity in the pons. Cerebellum appears negative. Vascular: Major intracranial vascular flow voids are stable. Major dural venous sinuses are enhancing and appear to be patent. Skull and upper cervical spine: Normal for age visible cervical spine, negative visible spinal cord. Visualized bone marrow signal is within normal limits. Sinuses/Orbits: Stable and negative, stable postoperative changes to the globes. Mild new left sphenoid sinus mucosal thickening but the other sinuses remain well aerated. Other: Mastoids remain clear. Visible internal auditory structures appear normal. Negative visible scalp and face. IMPRESSION: 1. No metastatic disease or acute intracranial abnormality. 2. Mild to moderate for age signal changes in the white matter and pons, most commonly due to chronic small vessel disease. Electronically Signed   By: Genevie Ann M.D.   On: 06/30/2022 07:31   ECHOCARDIOGRAM COMPLETE  Result Date: 06/27/2022    ECHOCARDIOGRAM REPORT   Patient Name:   Yvette Curry Date of Exam: 06/27/2022 Medical Rec #:  [id number]       Height:       64.0 in Accession #:    [id number]      Weight:       158.4 lb Date of Birth:  1956-01-17       BSA:          1.772 m Patient Age:    5 years        BP:           100/69 mmHg Patient Gender: F               HR:           77 bpm. Exam Location:  [address] Procedure: 2D Echo, 3D Echo, Cardiac Doppler, Color Doppler and Strain Analysis  Indications:    R06.00 Dyspnea  History:        Patient has no prior history of Echocardiogram examinations.                 Signs/Symptoms:Dyspnea; Risk Factors:Dyslipidemia and Current                 Smoker. Lung cancer.  Sonographer:    Cresenciano Lick RDCS Referring Phys: [id number] VISHNU P MALLIPEDDI IMPRESSIONS  1. Left ventricular ejection fraction, by estimation, is 45 to 50%. Left ventricular ejection fraction by 3D volume is 45 %. The left ventricle has mildly decreased function. The left ventricle demonstrates global hypokinesis. Left ventricular diastolic  parameters are consistent with Grade I diastolic dysfunction (impaired relaxation).  2. Right ventricular systolic function is normal. The right ventricular size is normal. There is normal pulmonary artery systolic pressure. The estimated right ventricular systolic pressure is 99991111 mmHg.  3. The mitral valve is normal in structure. No evidence of mitral valve regurgitation. No evidence of mitral stenosis.  4. The aortic valve is tricuspid. Aortic valve regurgitation is mild. No aortic stenosis is present.  5. The inferior vena cava is normal in size with greater than 50% respiratory variability, suggesting right atrial pressure of 3 mmHg. FINDINGS  Left Ventricle: Left ventricular ejection fraction, by estimation, is 45 to 50%. Left ventricular ejection fraction by 3D volume is 45 %. The left ventricle has mildly decreased function. The left ventricle demonstrates global hypokinesis. The left ventricular internal cavity size was normal in size. There is no left ventricular hypertrophy. Left ventricular diastolic parameters are consistent with Grade I diastolic  dysfunction (impaired relaxation). Right Ventricle: The right ventricular size is normal. No increase in right ventricular wall thickness. Right ventricular systolic function is normal. There is normal pulmonary artery systolic pressure. The tricuspid regurgitant velocity is 1.80 m/s, and   with an assumed right atrial pressure of 3 mmHg, the estimated right ventricular systolic pressure is [id number] mmHg. Left Atrium: Left atrial size was normal in size. Right Atrium: Right atrial size was normal in size. Pericardium: There is no evidence of pericardial effusion. Mitral Valve: The mitral valve is normal in structure. No evidence of mitral valve regurgitation. No evidence of mitral valve stenosis. Tricuspid Valve: The tricuspid valve is normal in structure. Tricuspid valve regurgitation is trivial. Aortic Valve: The aortic valve is tricuspid. Aortic valve regurgitation is mild. Aortic regurgitation PHT measures 752 msec. No aortic stenosis is present. Pulmonic Valve: The pulmonic valve was not well visualized. Pulmonic valve regurgitation is not visualized. Aorta: The aortic root and ascending aorta are structurally normal, with no evidence of dilitation. Venous: The inferior vena cava is normal in size with greater than 50% respiratory variability, suggesting right atrial pressure of 3 mmHg. IAS/Shunts: The interatrial septum was not well visualized.  LEFT VENTRICLE PLAX 2D LVIDd:         5.00 cm         Diastology LVIDs:         3.70 cm         LV e' medial:    6.14 cm/s LV PW:         0.70 cm         LV E/e' medial:  11.5 LV IVS:        0.70 cm         LV e' lateral:   7.01 cm/s LVOT diam:     2.00 cm         LV E/e' lateral: 10.1 LV SV:         49 LV SV Index:   28              2D LVOT Area:     3.14 cm        Longitudinal                                Strain                                2D Strain GLS  -20.7 %                                (A2C):                                2D Strain GLS  -21.7 %                                (A3C):                                2D Strain GLS  -20.1 %                                (  A4C):                                2D Strain GLS  -20.8 %                                Avg:                                 3D Volume EF                                LV 3D  EF:    Left                                             ventricul                                             ar                                             ejection                                             fraction                                             by 3D                                             volume is                                             45 %.                                 3D Volume EF:                                3D EF:        45 %                                LV EDV:       120 ml  LV ESV:       67 ml                                LV SV:        54 ml RIGHT VENTRICLE             IVC RV Basal diam:  3.40 cm     IVC diam: 1.00 cm RV S prime:     10.63 cm/s TAPSE (M-mode): 1.8 cm LEFT ATRIUM             Index        RIGHT ATRIUM           Index LA diam:        3.60 cm 2.03 cm/m   RA Area:     10.50 cm LA Vol (A2C):   30.7 ml 17.33 ml/m  RA Volume:   25.30 ml  14.28 ml/m LA Vol (A4C):   25.2 ml 14.22 ml/m LA Biplane Vol: 30.5 ml 17.21 ml/m  AORTIC VALVE LVOT Vmax:   80.77 cm/s LVOT Vmean:  53.467 cm/s LVOT VTI:    0.155 m AI PHT:      752 msec  AORTA Ao Root diam: 3.30 cm Ao Asc diam:  3.70 cm MITRAL VALVE               TRICUSPID VALVE MV Area (PHT): 4.68 cm    TR Peak grad:   13.0 mmHg MV Decel Time: 162 msec    TR Vmax:        180.00 cm/s MV E velocity: 70.95 cm/s MV A velocity: 86.60 cm/s  SHUNTS MV E/A ratio:  0.82        Systemic VTI:  0.16 m                            Systemic Diam: 2.00 cm Oswaldo Milian MD Electronically signed by Oswaldo Milian MD Signature Date/Time: 06/27/2022/10:47:03 AM    Final    DG Bone Density  Result Date: 06/16/2022 EXAM: DUAL X-RAY ABSORPTIOMETRY (DXA) FOR BONE MINERAL DENSITY IMPRESSION: Your patient Yvette Curry completed a BMD test on 06/16/2022 using the [employer] (software version: 14.10) manufactured by [employer]. The following summarizes the results of our  evaluation. Technologist: [facility] PATIENT BIOGRAPHICAL: Name: Yvette Curry, Yvette Curry Patient ID: [id number] Birth Date: 03/31/1956 Height: 60.0 in. Gender: Female Exam Date: 06/16/2022 Weight: 160.5 lbs. Indications: Bilateral Oophrectomy, Caucasian, Height Loss, Low Calcium Intake, Post Menopausal, Tobacco User Fractures: Treatments DENSITOMETRY RESULTS: Site         Region     Measured Date Measured Age WHO Classification Young Adult T-score BMD         %Change vs. Previous Significant Change (*) DualFemur Neck Left 06/16/2022 65.3 Osteoporosis -3.0 0.624 g/cm2 - - DualFemur Total Mean 06/16/2022 65.3 Osteoporosis -2.7 0.668 g/cm2 - - Left Forearm Radius 33% 06/16/2022 65.3 Osteopenia -2.0 0.572 g/cm2 - - ASSESSMENT: The BMD measured at Femur Neck Left is 0.624 g/cm2 with a T-score of -3.0. This patient is considered osteoporotic according to [facility]) criteria. The scan quality is good. Lumbar spine was excluded due to advanced degenerative changes. World Health Organization [facility]) criteria for post-menopausal, Caucasian Women: Normal:       T-score at or above -1 SD Osteopenia:   T-score between -1 and -2.5 SD  Osteoporosis: T-score at or below -2.5 SD RECOMMENDATIONS: 1. All patients should optimize calcium and vitamin D intake. 2. Consider FDA-approved medical therapies in postmenopausal women and med aged 76 years and older, based on the following: a. A hip or vertebral (clinical or morphometric) fracture b. T-score< -2.5 at the femoral neck or spine after appropriate evaluation to exclude secondary causes c. Low bone mass (T-score between -1.0 and -2.5 at the femoral neck or spine) and a 10-year probability of a hip fracture > 3% or a 10-year probability of a major osteoporosis-related fracture > 20% based on the [place]-adapted WHO algorithm d. Clinician judgment and/or patient preferences may indicate treatment for people with 10-year fracture probabilities above or below these levels FOLLOW-UP:  Patients with diagnosis of osteoporosis or at high risk for fracture should have regular bone mineral density tests. For patients eligible for Medicare, routine testing is allowed once every 2 years. The testing frequency can be increased to one year for patients who have rapidly progressing disease, those who are receiving or discontinuing medical therapy to restore bone mass, or have additional risk factors. I have reviewed this report, and agree with the above findings. Mark A. Thornton Papas, M.D. [facility] Electronically Signed   By: Lavonia Dana M.D.   On: 06/16/2022 12:58     ASSESSMENT/PLAN:  This is a very pleasant 67 year old Caucasian female diagnosed with stage IV (T1b, N3, M1 B) non-small cell lung cancer, adenocarcinoma.  She presented with a right upper lobe pulmonary nodule in addition to widespread metastatic adenopathy with the ipsilateral hilum, bilateral mediastinal, supraclavicular, left axillary, and mesenteric lymph nodes.  She was diagnosed in August 2023.  There was insufficient material for molecular studies but [employer] showed no actionable mutations.   She is currently undergoing systemic palliative chemotherapy with carboplatin for an AUC of 5, Alimta 500 mg/m, and immunotherapy with Keytruda 200 mg IV every 3 weeks.  Starting from cycle #5, she started maintenance Alimta and Keytruda.  she is status post 9 cycles of treatment.   The patient recently had a restaging CT scan performed as well as a brain MRI.  The patient was seen with Dr. Julien Nordmann today.  Dr. Julien Nordmann personally and independently reviewed the scan and discussed the results with the patient today.  The scan did not show any evidence of disease progression except for slight enlargement of the left axillary lymph node that we will monitor closely.   Dr. Julien Nordmann recommends that she continue on the same treatment at the same dose.   She will proceed with cycle #10 today scheduled.  We will arrange for  a UA and culture since the scan incidentally mentioned small amount of gas in the bladder.   We will see her back for a follow up visit in 3 weeks for evaluation before undergoing starting cycle #11.   She will monitor her BP closely while on her cardiac medications and to keep a log of her readings. If any concerning BP readings, she will reach out to her cardiologist. She will discontinue her HCTZ for now since she will be starting entresto and metoprolol.   We reviewed constipation education today. If she takes a stool softener regularly, drinks plenty of fluids, eats fruits/veggies, and increases her activity, this will help constipation. Should she be acutely constipated, she was advised to take a laxative.   The patient was advised to call immediately if she has any concerning symptoms in the interval. The patient voices understanding of current disease  status and treatment options and is in agreement with the current care plan. All questions were answered. The patient knows to call the clinic with any problems, questions or concerns. We can certainly see the patient much sooner if necessary      Orders Placed This Encounter  Procedures   Urine Culture    Standing Status:   Future    Standing Expiration Date:   07/02/2023   Urinalysis, Complete w Microscopic    Standing Status:   Future    Standing Expiration Date:   07/02/2023     Tobe Sos Max Nuno, PA-C 07/02/22  ADDENDUM: Hematology/Oncology Attending: I had a face to face encounter with the patient today.  I reviewed her record, lab, scan and recommended her care plan.  This is a very pleasant 67 years old white female with a stage IV non-small cell lung cancer, adenocarcinoma diagnosed in August 2023 with no actionable mutations.  The patient is currently undergoing systemic chemotherapy initially with carboplatin, Alimta and Keytruda for 4 cycles.  She is currently on maintenance treatment with Alimta and Keytruda  every 3 weeks for 5 more cycles and she has been tolerating this treatment fairly well except for mild fatigue.  She was complaining of swelling of the lower extremities and she was seen by vascular surgery and was noted to have venous insufficiency with incompetent deep venous system as well as the great saphenous and small saphenous veins but there was no evidence for deep venous thrombosis or superficial venous thrombosis.  She was advised to use compression stockings.  She also had evaluation by cardiology and 2D echo showed ejection fraction of 45-50%.  She was given prescription for metoprolol and Entresto.  She was advised to discontinue HCTZ as well as Lasix.  The patient tolerated the last cycle of her treatment with chemotherapy fairly well.  She had repeat CT scan of the chest, abdomen and pelvis performed recently.  I personally and independently reviewed the scans and discussed the result with the patient and her husband today.  Her scan showed a stable disease except for a slightly enlarged left axillary lymph node which will need to be closely monitored upcoming imaging studies.  MRI of the brain also showed no concerning findings for brain metastasis. I recommended for the patient to continue her current treatment with maintenance Alimta and Keytruda and she will proceed with cycle #10 today. She will come back for follow-up visit in 3 weeks for evaluation before the next cycle of her treatment. The patient was advised to call immediately if she has any other concerning symptoms in the interval. The total time spent in the appointment was 30 minutes. Disclaimer: This note was dictated with voice recognition software. Similar sounding words can inadvertently be transcribed and may be missed upon review. Eilleen Kempf, MD

## 2022-06-30 NOTE — Patient Instructions (Addendum)
Continue your medications.  We will be in contact regarding cardiology recommendations.  Follow up in 3 months.

## 2022-07-01 ENCOUNTER — Encounter: Payer: Self-pay | Admitting: Physician Assistant

## 2022-07-01 ENCOUNTER — Encounter: Payer: Self-pay | Admitting: Family Medicine

## 2022-07-01 ENCOUNTER — Ambulatory Visit (INDEPENDENT_AMBULATORY_CARE_PROVIDER_SITE_OTHER): Payer: Medicare Other | Admitting: Physician Assistant

## 2022-07-01 VITALS — BP 140/92 | HR 85 | Temp 98.1°F | Ht 61.0 in | Wt 158.0 lb

## 2022-07-01 DIAGNOSIS — I5020 Unspecified systolic (congestive) heart failure: Secondary | ICD-10-CM | POA: Insufficient documentation

## 2022-07-01 DIAGNOSIS — R600 Localized edema: Secondary | ICD-10-CM

## 2022-07-01 DIAGNOSIS — M7989 Other specified soft tissue disorders: Secondary | ICD-10-CM

## 2022-07-01 DIAGNOSIS — I5032 Chronic diastolic (congestive) heart failure: Secondary | ICD-10-CM | POA: Insufficient documentation

## 2022-07-01 NOTE — Progress Notes (Signed)
Subjective:  Patient ID: Yvette Curry, female    DOB: 1955/06/06  Age: 67 y.o. MRN: [id number]  CC: Chief Complaint  Patient presents with   Leg Swelling    Follow up will see vascular today   discuss retina spec    Recommended eplernone    HPI:  66 year old female presents for follow-up.  Lower extremity edema has improved but is still present.  She is wearing compression.  Pain associated with this is improved.  She recently had an echocardiogram.  Will discuss results with her today.  Patient Active Problem List   Diagnosis Date Noted   HFrEF (heart failure with reduced ejection fraction) ([place]) 07/01/2022   Dizziness 06/26/2022   Essential hypertension 06/20/2022   Aortic atherosclerosis ([place]) 06/04/2022   Emphysema lung ([place]) 06/04/2022   Lower extremity edema 06/04/2022   SOB (shortness of breath) 06/04/2022   Rash 06/04/2022   Neutropenia ([place]) 03/27/2022   Encounter for antineoplastic chemotherapy 03/19/2022   Adenocarcinoma of right lung, stage 4 ([place]) 12/12/2021   Tobacco abuse 11/06/2014    Social Hx   Social History   Socioeconomic History   Marital status: Married    Spouse name: Tyrone Nine   Number of children: Y   Years of education: Not on file   Highest education level: Not on file  Occupational History   Occupation: Scientist, research (medical): UNEMPLOYED  Tobacco Use   Smoking status: Every Day    Packs/day: 1.50    Years: 37.00    Total pack years: 55.50    Types: Cigarettes    Passive exposure: Current   Smokeless tobacco: Never  Vaping Use   Vaping Use: Former  Substance and Sexual Activity   Alcohol use: No   Drug use: No   Sexual activity: Yes    Comment: hysterectomy  Other Topics Concern   Not on file  Social History Narrative   Not on file   Social Determinants of Health   Financial Resource Strain: Not on file  Food Insecurity: Not on file  Transportation Needs: Not on file  Physical Activity: Not on file  Stress: Not on file   Social Connections: Not on file    Review of Systems Per HPI  Objective:  BP 136/89   Pulse 94   Temp 97.9 F (36.6 C)   Ht '5\' 4"'$  (1.626 m)   Wt 159 lb (72.1 kg)   SpO2 97%   BMI 27.29 kg/m      06/30/2022    1:41 PM 06/26/2022   10:14 AM 06/26/2022   10:12 AM  BP/Weight  Systolic BP [id number]  Diastolic BP 89 69 70  Wt. (Lbs) 159    BMI 27.29 kg/m2      Physical Exam Vitals and nursing note reviewed.  Constitutional:      General: She is not in acute distress.    Appearance: Normal appearance.  HENT:     Head: Normocephalic and atraumatic.  Cardiovascular:     Rate and Rhythm: Normal rate and regular rhythm.     Comments: Lower extremity edema improved. Pulmonary:     Effort: Pulmonary effort is normal.     Breath sounds: Normal breath sounds. No wheezing, rhonchi or rales.  Neurological:     Mental Status: She is alert.  Psychiatric:        Mood and Affect: Mood normal.        Behavior: Behavior normal.     Lab Results  Component Value Date   WBC 7.7 06/11/2022   HGB 12.3 06/11/2022   HCT 36.0 06/11/2022   PLT 232 06/11/2022   GLUCOSE 95 06/26/2022   ALT 48 (H) 06/11/2022   AST 39 06/11/2022   NA 145 (H) 06/26/2022   K 4.0 06/26/2022   CL 101 06/26/2022   CREATININE 1.03 (H) 06/26/2022   BUN 14 06/26/2022   CO2 27 06/26/2022   TSH 2.357 06/11/2022   INR 0.9 12/10/2021     Assessment & Plan:   Problem List Items Addressed This Visit       Cardiovascular and Mediastinum   HFrEF (heart failure with reduced ejection fraction) (HCC)    EF 45 to 50% based on recent echo.  I have spoken with cardiology.  They are going to manage.        Other   Lower extremity edema - Primary    Multifactorial but recent echo revealed HFrEF.  I have spoke with cardiologist.  She will reach out to the patient to discuss echo findings and to start treatment.  Patient is to continue HCTZ for now.       Follow-up:  3 months  [place]

## 2022-07-01 NOTE — Assessment & Plan Note (Signed)
EF 45 to 50% based on recent echo.  I have spoken with cardiology.  They are going to manage.

## 2022-07-01 NOTE — Assessment & Plan Note (Signed)
Multifactorial but recent echo revealed HFrEF.  I have spoke with cardiologist.  She will reach out to the patient to discuss echo findings and to start treatment.  Patient is to continue HCTZ for now.

## 2022-07-02 ENCOUNTER — Inpatient Hospital Stay: Payer: Medicare Other

## 2022-07-02 ENCOUNTER — Other Ambulatory Visit: Payer: Medicare Other

## 2022-07-02 ENCOUNTER — Inpatient Hospital Stay: Payer: Medicare Other | Attending: Internal Medicine

## 2022-07-02 ENCOUNTER — Inpatient Hospital Stay (HOSPITAL_BASED_OUTPATIENT_CLINIC_OR_DEPARTMENT_OTHER): Payer: Medicare Other | Admitting: Physician Assistant

## 2022-07-02 VITALS — BP 129/88 | HR 78 | Temp 98.7°F | Resp 17 | Wt 158.5 lb

## 2022-07-02 DIAGNOSIS — C3491 Malignant neoplasm of unspecified part of right bronchus or lung: Secondary | ICD-10-CM

## 2022-07-02 DIAGNOSIS — C3411 Malignant neoplasm of upper lobe, right bronchus or lung: Secondary | ICD-10-CM | POA: Diagnosis present

## 2022-07-02 DIAGNOSIS — Z5112 Encounter for antineoplastic immunotherapy: Secondary | ICD-10-CM | POA: Diagnosis present

## 2022-07-02 DIAGNOSIS — Z5111 Encounter for antineoplastic chemotherapy: Secondary | ICD-10-CM

## 2022-07-02 DIAGNOSIS — Z95828 Presence of other vascular implants and grafts: Secondary | ICD-10-CM

## 2022-07-02 DIAGNOSIS — Z79899 Other long term (current) drug therapy: Secondary | ICD-10-CM | POA: Insufficient documentation

## 2022-07-02 DIAGNOSIS — C771 Secondary and unspecified malignant neoplasm of intrathoracic lymph nodes: Secondary | ICD-10-CM | POA: Diagnosis not present

## 2022-07-02 DIAGNOSIS — F17210 Nicotine dependence, cigarettes, uncomplicated: Secondary | ICD-10-CM | POA: Insufficient documentation

## 2022-07-02 LAB — CMP (CANCER CENTER ONLY)
ALT: 38 U/L (ref 0–44)
AST: 35 U/L (ref 15–41)
Albumin: 4.1 g/dL (ref 3.5–5.0)
Alkaline Phosphatase: 69 U/L (ref 38–126)
Anion gap: 7 (ref 5–15)
BUN: 25 mg/dL — ABNORMAL HIGH (ref 8–23)
CO2: 32 mmol/L (ref 22–32)
Calcium: 10 mg/dL (ref 8.9–10.3)
Chloride: 100 mmol/L (ref 98–111)
Creatinine: 1.07 mg/dL — ABNORMAL HIGH (ref 0.44–1.00)
GFR, Estimated: 58 mL/min — ABNORMAL LOW (ref >60)
Glucose, Bld: 96 mg/dL (ref 70–99)
Potassium: 3.7 mmol/L (ref 3.5–5.1)
Sodium: 139 mmol/L (ref 135–145)
Total Bilirubin: 0.3 mg/dL (ref 0.3–1.2)
Total Protein: 7.4 g/dL (ref 6.5–8.1)

## 2022-07-02 LAB — URINALYSIS, COMPLETE (UACMP) WITH MICROSCOPIC
Bacteria, UA: NONE SEEN
Bilirubin Urine: NEGATIVE
Glucose, UA: NEGATIVE mg/dL
Ketones, ur: NEGATIVE mg/dL
Leukocytes,Ua: NEGATIVE
Nitrite: NEGATIVE
Protein, ur: NEGATIVE mg/dL
Specific Gravity, Urine: 1.011 (ref 1.005–1.030)
pH: 6 (ref 5.0–8.0)

## 2022-07-02 LAB — CBC WITH DIFFERENTIAL (CANCER CENTER ONLY)
Abs Immature Granulocytes: 0.03 10*3/uL (ref 0.00–0.07)
Basophils Absolute: 0.1 10*3/uL (ref 0.0–0.1)
Basophils Relative: 1 %
Eosinophils Absolute: 0.1 10*3/uL (ref 0.0–0.5)
Eosinophils Relative: 2 %
HCT: 36.1 % (ref 36.0–46.0)
Hemoglobin: 12.4 g/dL (ref 12.0–15.0)
Immature Granulocytes: 0 %
Lymphocytes Relative: 29 %
Lymphs Abs: 2.2 10*3/uL (ref 0.7–4.0)
MCH: 35.3 pg — ABNORMAL HIGH (ref 26.0–34.0)
MCHC: 34.3 g/dL (ref 30.0–36.0)
MCV: 102.8 fL — ABNORMAL HIGH (ref 80.0–100.0)
Monocytes Absolute: 1 10*3/uL (ref 0.1–1.0)
Monocytes Relative: 14 %
Neutro Abs: 4.1 10*3/uL (ref 1.7–7.7)
Neutrophils Relative %: 54 %
Platelet Count: 243 10*3/uL (ref 150–400)
RBC: 3.51 MIL/uL — ABNORMAL LOW (ref 3.87–5.11)
RDW: 14.5 % (ref 11.5–15.5)
WBC Count: 7.5 10*3/uL (ref 4.0–10.5)
nRBC: 0 % (ref 0.0–0.2)

## 2022-07-02 MED ORDER — SODIUM CHLORIDE 0.9 % IV SOLN
200.0000 mg | Freq: Once | INTRAVENOUS | Status: AC
  Administered 2022-07-02: 200 mg via INTRAVENOUS
  Filled 2022-07-02: qty 8

## 2022-07-02 MED ORDER — SODIUM CHLORIDE 0.9% FLUSH
10.0000 mL | INTRAVENOUS | Status: AC | PRN
  Administered 2022-07-02: 10 mL

## 2022-07-02 MED ORDER — SODIUM CHLORIDE 0.9 % IV SOLN: Freq: Once | INTRAVENOUS | Status: AC

## 2022-07-02 MED ORDER — SODIUM CHLORIDE 0.9% FLUSH
10.0000 mL | INTRAVENOUS | Status: DC | PRN
  Administered 2022-07-02: 10 mL

## 2022-07-02 MED ORDER — SODIUM CHLORIDE 0.9 % IV SOLN
400.0000 mg/m2 | Freq: Once | INTRAVENOUS | Status: AC
  Administered 2022-07-02: 700 mg via INTRAVENOUS
  Filled 2022-07-02: qty 20

## 2022-07-02 MED ORDER — HEPARIN SOD (PORK) LOCK FLUSH 100 UNIT/ML IV SOLN
500.0000 [IU] | Freq: Once | INTRAVENOUS | Status: AC | PRN
  Administered 2022-07-02: 500 [IU]

## 2022-07-02 MED ORDER — PROCHLORPERAZINE MALEATE 10 MG PO TABS
10.0000 mg | ORAL_TABLET | Freq: Once | ORAL | Status: AC
  Administered 2022-07-02: 10 mg via ORAL
  Filled 2022-07-02: qty 1

## 2022-07-02 NOTE — Patient Instructions (Signed)
[facility]  Discharge Instructions: Thank you for choosing [facility] to provide your oncology and hematology care.   If you have a lab appointment with the Cancer Center, please go directly to the Cancer Center and check in at the registration area.   Wear comfortable clothing and clothing appropriate for easy access to any Portacath or PICC line.   We strive to give you quality time with your provider. You may need to reschedule your appointment if you arrive late (15 or more minutes).  Arriving late affects you and other patients whose appointments are after yours.  Also, if you miss three or more appointments without notifying the office, you may be dismissed from the clinic at the provider's discretion.      For prescription refill requests, have your pharmacy contact our office and allow 72 hours for refills to be completed.    Today you received the following chemotherapy and/or immunotherapy agents: Keytruda and Alimta      To help prevent nausea and vomiting after your treatment, we encourage you to take your nausea medication as directed.  BELOW ARE SYMPTOMS THAT SHOULD BE REPORTED IMMEDIATELY: *FEVER GREATER THAN 100.4 F (38 C) OR HIGHER *CHILLS OR SWEATING *NAUSEA AND VOMITING THAT IS NOT CONTROLLED WITH YOUR NAUSEA MEDICATION *UNUSUAL SHORTNESS OF BREATH *UNUSUAL BRUISING OR BLEEDING *URINARY PROBLEMS (pain or burning when urinating, or frequent urination) *BOWEL PROBLEMS (unusual diarrhea, constipation, pain near the anus) TENDERNESS IN MOUTH AND THROAT WITH OR WITHOUT PRESENCE OF ULCERS (sore throat, sores in mouth, or a toothache) UNUSUAL RASH, SWELLING OR PAIN  UNUSUAL VAGINAL DISCHARGE OR ITCHING   Items with * indicate a potential emergency and should be followed up as soon as possible or go to the Emergency Department if any problems should occur.  Please show the CHEMOTHERAPY ALERT CARD or IMMUNOTHERAPY ALERT  CARD at check-in to the Emergency Department and triage nurse.  Should you have questions after your visit or need to cancel or reschedule your appointment, please contact [facility]  Dept: [phone]  and follow the prompts.  Office hours are 8:00 a.m. to 4:30 p.m. Monday - Friday. Please note that voicemails left after 4:00 p.m. may not be returned until the following business day.  We are closed weekends and major holidays. You have access to a nurse at all times for urgent questions. Please call the main number to the clinic Dept: [phone] and follow the prompts.   For any non-urgent questions, you may also contact your provider using MyChart. We now offer e-Visits for anyone 18 and older to request care online for non-urgent symptoms. For details visit [email or web address].   Also download the MyChart app! Go to the app store, search "MyChart", open the app, select , and log in with your MyChart username and password.   

## 2022-07-02 NOTE — Progress Notes (Signed)
Patient seen by PA today  Vitals are within treatment parameters.  Labs reviewed: and are within treatment parameters.  Per physician team, patient is ready for treatment and there are NO modifications to the treatment plan.  

## 2022-07-03 DIAGNOSIS — I8393 Asymptomatic varicose veins of bilateral lower extremities: Secondary | ICD-10-CM

## 2022-07-03 LAB — URINE CULTURE: Culture: NO GROWTH

## 2022-07-04 NOTE — Telephone Encounter (Signed)
Pt calling for an update.

## 2022-07-07 ENCOUNTER — Other Ambulatory Visit: Payer: Self-pay

## 2022-07-07 MED ORDER — METOPROLOL SUCCINATE ER 25 MG PO TB24: 12.5000 mg | ORAL_TABLET | Freq: Every day | ORAL | 3 refills | Status: DC

## 2022-07-09 ENCOUNTER — Telehealth: Payer: Self-pay | Admitting: Internal Medicine

## 2022-07-09 NOTE — Telephone Encounter (Signed)
3 month f/u appointment arranged per echo result

## 2022-07-09 NOTE — Telephone Encounter (Signed)
Patient states she was returning call. Please advise  ?

## 2022-07-10 ENCOUNTER — Telehealth: Payer: Self-pay | Admitting: Medical Oncology

## 2022-07-10 ENCOUNTER — Telehealth: Payer: Self-pay | Admitting: Internal Medicine

## 2022-07-10 NOTE — Telephone Encounter (Signed)
Pt c/o medication issue:  1. Name of Medication: metoprolol succinate (TOPROL XL) 25 MG 24 hr tablet   2. How are you currently taking this medication (dosage and times per day)? Started taking 03/19. Has not taken today's dose.   3. Are you having a reaction (difficulty breathing--STAT)? Yes   4. What is your medication issue? Patient states that she starting breaking out in rash and hives since starting this medication she first thought was from chemo. Requesting call back to see if this could be from this medication. Rash is not where it normally is with chemo.

## 2022-07-10 NOTE — Telephone Encounter (Signed)
Red ,itchy rash in new areas and now with bumps. The rash is now at  belt line , chest ,legs, arms  Using Triamcinolone cream and taking Benadryl 25 mg every 4-5 hours.  Rash started 8 days ago after last tx and bumps started 3 days ago.  New -Metoprolol started Monday. She is also calling her cardiologist to see if her new med can cause a itchy rash with bumps.

## 2022-07-10 NOTE — Telephone Encounter (Signed)
Patient informed and verbalized understanding of plan. 

## 2022-07-11 ENCOUNTER — Telehealth: Payer: Self-pay | Admitting: Family Medicine

## 2022-07-11 ENCOUNTER — Other Ambulatory Visit: Payer: Self-pay | Admitting: Family Medicine

## 2022-07-11 ENCOUNTER — Telehealth: Payer: Self-pay | Admitting: Internal Medicine

## 2022-07-11 NOTE — Telephone Encounter (Signed)
Patient made aware, verbalized understanding

## 2022-07-11 NOTE — Telephone Encounter (Signed)
Refill on triamcinolone ointment (KENALOG) 0.5 % sent to [facility]

## 2022-07-11 NOTE — Telephone Encounter (Signed)
Pt c/o medication issue:  1. Name of Medication: HCTZ  2. How are you currently taking this medication (dosage and times per day)?   3. Are you having a reaction (difficulty breathing--STAT)? no  4. What is your medication issue? Patient wants to know if she can go back on this medication since she is off her heart medication.  She noticed her feet were swollen last night.

## 2022-07-22 ENCOUNTER — Other Ambulatory Visit: Payer: Self-pay

## 2022-07-23 ENCOUNTER — Inpatient Hospital Stay (HOSPITAL_BASED_OUTPATIENT_CLINIC_OR_DEPARTMENT_OTHER): Payer: Medicare Other | Admitting: Internal Medicine

## 2022-07-23 ENCOUNTER — Inpatient Hospital Stay: Payer: Medicare Other

## 2022-07-23 ENCOUNTER — Inpatient Hospital Stay: Payer: Medicare Other | Attending: Internal Medicine

## 2022-07-23 VITALS — BP 152/83 | HR 70 | Temp 98.1°F | Resp 18 | Wt 159.0 lb

## 2022-07-23 DIAGNOSIS — C3491 Malignant neoplasm of unspecified part of right bronchus or lung: Secondary | ICD-10-CM | POA: Diagnosis not present

## 2022-07-23 DIAGNOSIS — C778 Secondary and unspecified malignant neoplasm of lymph nodes of multiple regions: Secondary | ICD-10-CM | POA: Diagnosis not present

## 2022-07-23 DIAGNOSIS — C3411 Malignant neoplasm of upper lobe, right bronchus or lung: Secondary | ICD-10-CM | POA: Insufficient documentation

## 2022-07-23 DIAGNOSIS — Z95828 Presence of other vascular implants and grafts: Secondary | ICD-10-CM | POA: Diagnosis not present

## 2022-07-23 DIAGNOSIS — M7989 Other specified soft tissue disorders: Secondary | ICD-10-CM | POA: Diagnosis not present

## 2022-07-23 DIAGNOSIS — Z79899 Other long term (current) drug therapy: Secondary | ICD-10-CM | POA: Insufficient documentation

## 2022-07-23 DIAGNOSIS — D649 Anemia, unspecified: Secondary | ICD-10-CM | POA: Insufficient documentation

## 2022-07-23 DIAGNOSIS — Z5112 Encounter for antineoplastic immunotherapy: Secondary | ICD-10-CM | POA: Diagnosis present

## 2022-07-23 DIAGNOSIS — T451X5A Adverse effect of antineoplastic and immunosuppressive drugs, initial encounter: Secondary | ICD-10-CM

## 2022-07-23 DIAGNOSIS — Z5111 Encounter for antineoplastic chemotherapy: Secondary | ICD-10-CM | POA: Diagnosis present

## 2022-07-23 LAB — CMP (CANCER CENTER ONLY)
ALT: 39 U/L (ref 0–44)
AST: 35 U/L (ref 15–41)
Albumin: 3.9 g/dL (ref 3.5–5.0)
Alkaline Phosphatase: 56 U/L (ref 38–126)
Anion gap: 4 — ABNORMAL LOW (ref 5–15)
BUN: 16 mg/dL (ref 8–23)
CO2: 29 mmol/L (ref 22–32)
Calcium: 10.1 mg/dL (ref 8.9–10.3)
Chloride: 109 mmol/L (ref 98–111)
Creatinine: 0.99 mg/dL (ref 0.44–1.00)
GFR, Estimated: >60 mL/min (ref >60)
Glucose, Bld: 89 mg/dL (ref 70–99)
Potassium: 4.3 mmol/L (ref 3.5–5.1)
Sodium: 142 mmol/L (ref 135–145)
Total Bilirubin: 0.3 mg/dL (ref 0.3–1.2)
Total Protein: 7.1 g/dL (ref 6.5–8.1)

## 2022-07-23 LAB — CBC WITH DIFFERENTIAL (CANCER CENTER ONLY)
Abs Immature Granulocytes: 0.03 10*3/uL (ref 0.00–0.07)
Basophils Absolute: 0.1 10*3/uL (ref 0.0–0.1)
Basophils Relative: 1 %
Eosinophils Absolute: 0.1 10*3/uL (ref 0.0–0.5)
Eosinophils Relative: 2 %
HCT: 37 % (ref 36.0–46.0)
Hemoglobin: 12.8 g/dL (ref 12.0–15.0)
Immature Granulocytes: 1 %
Lymphocytes Relative: 28 %
Lymphs Abs: 1.8 10*3/uL (ref 0.7–4.0)
MCH: 36.3 pg — ABNORMAL HIGH (ref 26.0–34.0)
MCHC: 34.6 g/dL (ref 30.0–36.0)
MCV: 104.8 fL — ABNORMAL HIGH (ref 80.0–100.0)
Monocytes Absolute: 0.9 10*3/uL (ref 0.1–1.0)
Monocytes Relative: 15 %
Neutro Abs: 3.4 10*3/uL (ref 1.7–7.7)
Neutrophils Relative %: 53 %
Platelet Count: 223 10*3/uL (ref 150–400)
RBC: 3.53 MIL/uL — ABNORMAL LOW (ref 3.87–5.11)
RDW: 15.7 % — ABNORMAL HIGH (ref 11.5–15.5)
WBC Count: 6.3 10*3/uL (ref 4.0–10.5)
nRBC: 0 % (ref 0.0–0.2)

## 2022-07-23 MED ORDER — SODIUM CHLORIDE 0.9 % IV SOLN
200.0000 mg | Freq: Once | INTRAVENOUS | Status: AC
  Administered 2022-07-23: 200 mg via INTRAVENOUS
  Filled 2022-07-23: qty 200

## 2022-07-23 MED ORDER — SODIUM CHLORIDE 0.9 % IV SOLN
400.0000 mg/m2 | Freq: Once | INTRAVENOUS | Status: AC
  Administered 2022-07-23: 700 mg via INTRAVENOUS
  Filled 2022-07-23: qty 20

## 2022-07-23 MED ORDER — SODIUM CHLORIDE 0.9% FLUSH
10.0000 mL | Freq: Once | INTRAVENOUS | Status: AC
  Administered 2022-07-23: 10 mL

## 2022-07-23 MED ORDER — PROCHLORPERAZINE MALEATE 10 MG PO TABS: 10.0000 mg | ORAL_TABLET | Freq: Once | ORAL | Status: DC

## 2022-07-23 MED ORDER — SODIUM CHLORIDE 0.9 % IV SOLN: Freq: Once | INTRAVENOUS | Status: AC

## 2022-07-23 NOTE — Progress Notes (Signed)
Per Dr. Julien Nordmann , it is ok to access port today , check for blood and give tx. Pt requests to access port.

## 2022-07-23 NOTE — Progress Notes (Signed)
Pt. Here for port flush/lab, see the doctor and infusion.  C/O pain to port site while walking, sleeping on her right side, and painful to touching.  Spoke with Corene Cornea about her concerns.  Corene Cornea assessed pt. and spoke with DR. Mohamed/ and or his nurse.  They agreed pt. will have labs drawn from her arm, see the doctor, and then go to [facility] hospital to have a Chest X-ray done, before infusion today.  Pt. was informed.

## 2022-07-23 NOTE — Progress Notes (Signed)
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 quality axial postcontrast imaging today. No abnormal enhancement identified. No dural thickening identified. Mild for age periventricular and other scattered nonspecific cerebral white matter T2 and FLAIR hyperintensity is stable. No cortical encephalomalacia or chronic cerebral blood products identified. Stable mild to moderate T2 heterogeneity in the pons. Cerebellum appears negative. Vascular: Major intracranial vascular flow voids are stable. Major dural venous sinuses are enhancing and appear to be patent. Skull and upper cervical spine: Normal for age visible cervical spine, negative visible spinal cord. Visualized bone marrow signal is within normal limits. Sinuses/Orbits: Stable and negative, stable postoperative changes to the  globes. Mild new left sphenoid sinus mucosal thickening but the other sinuses remain well aerated. Other: Mastoids remain clear. Visible internal auditory structures appear normal. Negative visible scalp and face. IMPRESSION: 1. No metastatic disease or acute intracranial abnormality. 2. Mild to moderate for age signal changes in the white matter and pons, most commonly due to chronic small vessel disease. Electronically Signed   By: Genevie Ann M.D.   On: 06/30/2022 07:31   ECHOCARDIOGRAM COMPLETE  Result Date: 06/27/2022    ECHOCARDIOGRAM REPORT   Patient Name:   Yvette Curry Date of Exam: 06/27/2022 Medical Rec #:  [id number]       Height:       64.0 in Accession #:    [id number]      Weight:       158.4 lb Date of Birth:  12-28-1955       BSA:          1.772 m Patient Age:    70 years        BP:           100/69 mmHg Patient Gender: F               HR:           77 bpm. Exam Location:  [address] Procedure: 2D Echo, 3D Echo, Cardiac Doppler, Color Doppler and Strain Analysis Indications:    R06.00 Dyspnea  History:        Patient has no prior history of Echocardiogram examinations.                 Signs/Symptoms:Dyspnea; Risk Factors:Dyslipidemia and Current                 Smoker. Lung cancer.  Sonographer:    Cresenciano Lick RDCS Referring Phys: [id number] VISHNU P MALLIPEDDI IMPRESSIONS  1. Left ventricular ejection fraction, by estimation, is 45 to 50%. Left ventricular ejection fraction by 3D volume is 45 %. The left ventricle has mildly decreased function. The left ventricle demonstrates global hypokinesis. Left ventricular diastolic  parameters are consistent with Grade I diastolic dysfunction (impaired relaxation).  2. Right ventricular systolic function is normal. The right ventricular size is normal. There is normal pulmonary artery systolic pressure. The estimated right ventricular systolic pressure is 99991111 mmHg.  3.  The mitral valve is normal in structure. No evidence of mitral valve  regurgitation. No evidence of mitral stenosis.  4. The aortic valve is tricuspid. Aortic valve regurgitation is mild. No aortic stenosis is present.  5. The inferior vena cava is normal in size with greater than 50% respiratory variability, suggesting right atrial pressure of 3 mmHg. FINDINGS  Left Ventricle: Left ventricular ejection fraction, by estimation, is 45 to 50%. Left ventricular ejection fraction by 3D volume is 45 %. The left ventricle has mildly decreased function. The left ventricle demonstrates global hypokinesis. The left ventricular internal cavity size was normal in size. There is no left ventricular hypertrophy. Left ventricular diastolic parameters are consistent with Grade I diastolic dysfunction (impaired relaxation). Right Ventricle: The right ventricular size is normal. No increase in right ventricular wall thickness. Right ventricular systolic function is normal. There is normal pulmonary artery systolic pressure. The tricuspid regurgitant velocity is 1.80 m/s, and  with an assumed right atrial pressure of 3 mmHg, the estimated right ventricular systolic pressure is [id number] mmHg. Left Atrium: Left atrial size was normal in size. Right Atrium: Right atrial size was normal in size. Pericardium: There is no evidence of pericardial effusion. Mitral Valve: The mitral valve is normal in structure. No evidence of mitral valve regurgitation. No evidence of mitral valve stenosis. Tricuspid Valve: The tricuspid valve is normal in structure. Tricuspid valve regurgitation is trivial. Aortic Valve: The aortic valve is tricuspid. Aortic valve regurgitation is mild. Aortic regurgitation PHT measures 752 msec. No aortic stenosis is present. Pulmonic Valve: The pulmonic valve was not well visualized. Pulmonic valve regurgitation is not visualized. Aorta: The aortic root and ascending aorta are structurally normal, with no evidence of dilitation. Venous: The inferior vena cava is normal in size with greater than  50% respiratory variability, suggesting right atrial pressure of 3 mmHg. IAS/Shunts: The interatrial septum was not well visualized.  LEFT VENTRICLE PLAX 2D LVIDd:         5.00 cm         Diastology LVIDs:         3.70 cm         LV e' medial:    6.14 cm/s LV PW:         0.70 cm         LV E/e' medial:  11.5 LV IVS:        0.70 cm         LV e' lateral:   7.01 cm/s LVOT diam:     2.00 cm         LV E/e' lateral: 10.1 LV SV:         49 LV SV Index:   28              2D LVOT Area:     3.14 cm        Longitudinal                                Strain                                2D Strain GLS  -20.7 %                                (A2C):  2D Strain GLS  -21.7 %                                (A3C):                                2D Strain GLS  -20.1 %                                (A4C):                                2D Strain GLS  -20.8 %                                Avg:                                 3D Volume EF                                LV 3D EF:    Left                                             ventricul                                             ar                                             ejection                                             fraction                                             by 3D                                             volume is                                             45 %.                                 3D  Volume EF:                                3D EF:        45 %                                LV EDV:       120 ml                                LV ESV:       67 ml                                LV SV:        54 ml RIGHT VENTRICLE             IVC RV Basal diam:  3.40 cm     IVC diam: 1.00 cm RV S prime:     10.63 cm/s TAPSE (M-mode): 1.8 cm LEFT ATRIUM             Index        RIGHT ATRIUM           Index LA diam:        3.60 cm 2.03 cm/m   RA Area:     10.50 cm LA Vol (A2C):   30.7 ml 17.33 ml/m  RA Volume:   25.30 ml  14.28  ml/m LA Vol (A4C):   25.2 ml 14.22 ml/m LA Biplane Vol: 30.5 ml 17.21 ml/m  AORTIC VALVE LVOT Vmax:   80.77 cm/s LVOT Vmean:  53.467 cm/s LVOT VTI:    0.155 m AI PHT:      752 msec  AORTA Ao Root diam: 3.30 cm Ao Asc diam:  3.70 cm MITRAL VALVE               TRICUSPID VALVE MV Area (PHT): 4.68 cm    TR Peak grad:   13.0 mmHg MV Decel Time: 162 msec    TR Vmax:        180.00 cm/s MV E velocity: 70.95 cm/s MV A velocity: 86.60 cm/s  SHUNTS MV E/A ratio:  0.82        Systemic VTI:  0.16 m                            Systemic Diam: 2.00 cm Oswaldo Milian MD Electronically signed by Oswaldo Milian MD Signature Date/Time: 06/27/2022/10:47:03 AM    Final     ASSESSMENT AND PLAN: This is a very pleasant 67 years old white female recently diagnosed with a stage IV (T1b, N3, M1b) non-small cell lung cancer, adenocarcinoma presented with right upper lobe pulmonary nodule in addition to widespread metastatic adenopathy to the ipsilateral hilum as well as bilateral mediastinal and supraclavicular lymphadenopathy as well as left axillary and mesenteric lymph nodes diagnosed in August 2023. There was insufficient material for molecular testing but blood test by [facility] showed no actionable mutations. carboplatin for AUC of 5, Alimta 500 Mg/M2 and Keytruda 200 Mg IV every 3 weeks on 12/25/2021.  Status post 10 cycles.  Starting from cycle #5 she is on maintenance treatment with Alimta and Keytruda every 3  weeks.  Starting from cycle #6 her dose of Alimta was reduced to 400 Mg/M2 because of intolerance and persistent anemia. The patient has been tolerating this treatment well with no concerning adverse effect except for mild fatigue. I recommended for her to proceed with cycle #11 today as planned. For the Port-A-Cath issues including erythema and tenderness, will refer her to interventional radiology for evaluation. For the swelling of the lower extremity, she will continue on Lasix on as-needed basis. For  the history of anemia, she will continue on the oral iron tablets. The patient will come back for follow-up visit in 3 weeks for evaluation before starting cycle #12. She was advised to call immediately if she has any concerning symptoms in the interval. The patient voices understanding of current disease status and treatment options and is in agreement with the current care plan.  All questions were answered. The patient knows to call the clinic with any problems, questions or concerns. We can certainly see the patient much sooner if necessary.  The total time spent in the appointment was 20 minutes.  Disclaimer: This note was dictated with voice recognition software. Similar sounding words can inadvertently be transcribed and may not be corrected upon review.

## 2022-07-23 NOTE — Patient Instructions (Signed)
[facility]  Discharge Instructions: Thank you for choosing [facility] to provide your oncology and hematology care.   If you have a lab appointment with the Cancer Center, please go directly to the Cancer Center and check in at the registration area.   Wear comfortable clothing and clothing appropriate for easy access to any Portacath or PICC line.   We strive to give you quality time with your provider. You may need to reschedule your appointment if you arrive late (15 or more minutes).  Arriving late affects you and other patients whose appointments are after yours.  Also, if you miss three or more appointments without notifying the office, you may be dismissed from the clinic at the provider's discretion.      For prescription refill requests, have your pharmacy contact our office and allow 72 hours for refills to be completed.    Today you received the following chemotherapy and/or immunotherapy agents keytruda , alimta      To help prevent nausea and vomiting after your treatment, we encourage you to take your nausea medication as directed.  BELOW ARE SYMPTOMS THAT SHOULD BE REPORTED IMMEDIATELY: *FEVER GREATER THAN 100.4 F (38 C) OR HIGHER *CHILLS OR SWEATING *NAUSEA AND VOMITING THAT IS NOT CONTROLLED WITH YOUR NAUSEA MEDICATION *UNUSUAL SHORTNESS OF BREATH *UNUSUAL BRUISING OR BLEEDING *URINARY PROBLEMS (pain or burning when urinating, or frequent urination) *BOWEL PROBLEMS (unusual diarrhea, constipation, pain near the anus) TENDERNESS IN MOUTH AND THROAT WITH OR WITHOUT PRESENCE OF ULCERS (sore throat, sores in mouth, or a toothache) UNUSUAL RASH, SWELLING OR PAIN  UNUSUAL VAGINAL DISCHARGE OR ITCHING   Items with * indicate a potential emergency and should be followed up as soon as possible or go to the Emergency Department if any problems should occur.  Please show the CHEMOTHERAPY ALERT CARD or IMMUNOTHERAPY ALERT CARD  at check-in to the Emergency Department and triage nurse.  Should you have questions after your visit or need to cancel or reschedule your appointment, please contact [facility]  Dept: [phone]  and follow the prompts.  Office hours are 8:00 a.m. to 4:30 p.m. Monday - Friday. Please note that voicemails left after 4:00 p.m. may not be returned until the following business day.  We are closed weekends and major holidays. You have access to a nurse at all times for urgent questions. Please call the main number to the clinic Dept: [phone] and follow the prompts.   For any non-urgent questions, you may also contact your provider using MyChart. We now offer e-Visits for anyone 18 and older to request care online for non-urgent symptoms. For details visit [email or web address].   Also download the MyChart app! Go to the app store, search "MyChart", open the app, select [facility], and log in with your MyChart username and password.   

## 2022-08-07 ENCOUNTER — Telehealth: Payer: Self-pay | Admitting: Medical Oncology

## 2022-08-07 NOTE — Telephone Encounter (Signed)
New onset of R sided back pain x 2 days-" I want to scream it hurts bad".  Describes as muscle tightness.  Left leg numbness intermittently lasting 5-10 mins. Changing positions and heating pad have not helped. Thera Gesic rub helped last night for a short time.   Per [facility], I instructed pt to take 200-400 mg ibuprofen now . Her neighbor is going to get it from [facility].  She is going to see chiropractor.  I also instructed her to call EMS if symptoms do not resolve with Ibuprofen and or has any concerning new symptoms.  I told her to call back in an hour after taking ibuprofen.

## 2022-08-07 NOTE — Telephone Encounter (Signed)
Pt reported her back pain is gone 90 minutes after taking 400 mg Ibuprofen.

## 2022-08-07 NOTE — Telephone Encounter (Signed)
Back Pain update.- the ibuprofen helped and her chiropractor " he did an adjustment. I am so much better "

## 2022-08-13 ENCOUNTER — Encounter: Payer: Self-pay | Admitting: Internal Medicine

## 2022-08-13 ENCOUNTER — Inpatient Hospital Stay: Payer: Medicare Other

## 2022-08-13 ENCOUNTER — Inpatient Hospital Stay (HOSPITAL_BASED_OUTPATIENT_CLINIC_OR_DEPARTMENT_OTHER): Payer: Medicare Other | Admitting: Internal Medicine

## 2022-08-13 ENCOUNTER — Encounter: Payer: Self-pay | Admitting: Medical Oncology

## 2022-08-13 VITALS — BP 143/83 | HR 80 | Temp 97.5°F | Resp 16 | Wt 157.5 lb

## 2022-08-13 VITALS — BP 112/71 | HR 81 | Resp 16

## 2022-08-13 DIAGNOSIS — C3491 Malignant neoplasm of unspecified part of right bronchus or lung: Secondary | ICD-10-CM

## 2022-08-13 DIAGNOSIS — C3490 Malignant neoplasm of unspecified part of unspecified bronchus or lung: Secondary | ICD-10-CM

## 2022-08-13 DIAGNOSIS — D701 Agranulocytosis secondary to cancer chemotherapy: Secondary | ICD-10-CM

## 2022-08-13 DIAGNOSIS — Z5112 Encounter for antineoplastic immunotherapy: Secondary | ICD-10-CM | POA: Diagnosis not present

## 2022-08-13 LAB — CMP (CANCER CENTER ONLY)
ALT: 27 U/L (ref 0–44)
AST: 29 U/L (ref 15–41)
Albumin: 3.9 g/dL (ref 3.5–5.0)
Alkaline Phosphatase: 50 U/L (ref 38–126)
Anion gap: 4 — ABNORMAL LOW (ref 5–15)
BUN: 16 mg/dL (ref 8–23)
CO2: 30 mmol/L (ref 22–32)
Calcium: 9.9 mg/dL (ref 8.9–10.3)
Chloride: 107 mmol/L (ref 98–111)
Creatinine: 0.91 mg/dL (ref 0.44–1.00)
GFR, Estimated: >60 mL/min (ref >60)
Glucose, Bld: 92 mg/dL (ref 70–99)
Potassium: 4.3 mmol/L (ref 3.5–5.1)
Sodium: 141 mmol/L (ref 135–145)
Total Bilirubin: 0.4 mg/dL (ref 0.3–1.2)
Total Protein: 7.1 g/dL (ref 6.5–8.1)

## 2022-08-13 LAB — CBC WITH DIFFERENTIAL (CANCER CENTER ONLY)
Abs Immature Granulocytes: 0.03 10*3/uL (ref 0.00–0.07)
Basophils Absolute: 0.1 10*3/uL (ref 0.0–0.1)
Basophils Relative: 1 %
Eosinophils Absolute: 0.1 10*3/uL (ref 0.0–0.5)
Eosinophils Relative: 2 %
HCT: 34.5 % — ABNORMAL LOW (ref 36.0–46.0)
Hemoglobin: 11.9 g/dL — ABNORMAL LOW (ref 12.0–15.0)
Immature Granulocytes: 1 %
Lymphocytes Relative: 29 %
Lymphs Abs: 1.9 10*3/uL (ref 0.7–4.0)
MCH: 36.7 pg — ABNORMAL HIGH (ref 26.0–34.0)
MCHC: 34.5 g/dL (ref 30.0–36.0)
MCV: 106.5 fL — ABNORMAL HIGH (ref 80.0–100.0)
Monocytes Absolute: 0.8 10*3/uL (ref 0.1–1.0)
Monocytes Relative: 12 %
Neutro Abs: 3.7 10*3/uL (ref 1.7–7.7)
Neutrophils Relative %: 55 %
Platelet Count: 243 10*3/uL (ref 150–400)
RBC: 3.24 MIL/uL — ABNORMAL LOW (ref 3.87–5.11)
RDW: 16.6 % — ABNORMAL HIGH (ref 11.5–15.5)
WBC Count: 6.6 10*3/uL (ref 4.0–10.5)
nRBC: 0 % (ref 0.0–0.2)

## 2022-08-13 LAB — TSH: TSH: 1.71 u[IU]/mL (ref 0.350–4.500)

## 2022-08-13 MED ORDER — SODIUM CHLORIDE 0.9% FLUSH
10.0000 mL | INTRAVENOUS | Status: DC | PRN
  Administered 2022-08-13: 10 mL

## 2022-08-13 MED ORDER — SODIUM CHLORIDE 0.9 % IV SOLN
200.0000 mg | Freq: Once | INTRAVENOUS | Status: AC
  Administered 2022-08-13: 200 mg via INTRAVENOUS
  Filled 2022-08-13: qty 200

## 2022-08-13 MED ORDER — CYANOCOBALAMIN 1000 MCG/ML IJ SOLN
1000.0000 ug | Freq: Once | INTRAMUSCULAR | Status: AC
  Administered 2022-08-13: 1000 ug via INTRAMUSCULAR
  Filled 2022-08-13: qty 1

## 2022-08-13 MED ORDER — SODIUM CHLORIDE 0.9 % IV SOLN
400.0000 mg/m2 | Freq: Once | INTRAVENOUS | Status: AC
  Administered 2022-08-13: 700 mg via INTRAVENOUS
  Filled 2022-08-13: qty 28

## 2022-08-13 MED ORDER — SODIUM CHLORIDE 0.9 % IV SOLN: Freq: Once | INTRAVENOUS | Status: AC

## 2022-08-13 MED ORDER — HEPARIN SOD (PORK) LOCK FLUSH 100 UNIT/ML IV SOLN
500.0000 [IU] | Freq: Once | INTRAVENOUS | Status: AC | PRN
  Administered 2022-08-13: 500 [IU]

## 2022-08-13 MED ORDER — PROCHLORPERAZINE MALEATE 10 MG PO TABS
10.0000 mg | ORAL_TABLET | Freq: Once | ORAL | Status: DC
  Filled 2022-08-13: qty 1

## 2022-08-13 MED ORDER — SODIUM CHLORIDE 0.9% FLUSH
10.0000 mL | Freq: Once | INTRAVENOUS | Status: AC
  Administered 2022-08-13: 10 mL

## 2022-08-13 NOTE — Patient Instructions (Signed)
Steps to Quit Smoking Smoking tobacco is the leading cause of preventable death. It can affect almost every organ in the body. Smoking puts you and people around you at risk for many serious, long-lasting (chronic) diseases. Quitting smoking can be hard, but it is one of the best things that you can do for your health. It is never too late to quit. Do not give up if you cannot quit the first time. Some people need to try many times to quit. Do your best to stick to your quit plan, and talk with your doctor if you have any questions or concerns. How do I get ready to quit? Pick a date to quit. Set a date within the next 2 weeks to give you time to prepare. Write down the reasons why you are quitting. Keep this list in places where you will see it often. Tell your family, friends, and co-workers that you are quitting. Their support is important. Talk with your doctor about the choices that may help you quit. Find out if your health insurance will pay for these treatments. Know the people, places, things, and activities that make you want to smoke (triggers). Avoid them. What first steps can I take to quit smoking? Throw away all cigarettes at home, at work, and in your car. Throw away the things that you use when you smoke, such as ashtrays and lighters. Clean your car. Empty the ashtray. Clean your home, including curtains and carpets. What can I do to help me quit smoking? Talk with your doctor about taking medicines and seeing a counselor. You are more likely to succeed when you do both. If you are pregnant or breastfeeding: Talk with your doctor about counseling or other ways to quit smoking. Do not take medicine to help you quit smoking unless your doctor tells you to. Quit right away Quit smoking completely, instead of slowly cutting back on how much you smoke over a period of time. Stopping smoking right away may be more successful than slowly quitting. Go to counseling. In-person is best  if this is an option. You are more likely to quit if you go to counseling sessions regularly. Take medicine You may take medicines to help you quit. Some medicines need a prescription, and some you can buy over-the-counter. Some medicines may contain a drug called nicotine to replace the nicotine in cigarettes. Medicines may: Help you stop having the desire to smoke (cravings). Help to stop the problems that come when you stop smoking (withdrawal symptoms). Your doctor may ask you to use: Nicotine patches, gum, or lozenges. Nicotine inhalers or sprays. Non-nicotine medicine that you take by mouth. Find resources Find resources and other ways to help you quit smoking and remain smoke-free after you quit. They include: Online chats with a counselor. Phone quitlines. Printed self-help materials. Support groups or group counseling. Text messaging programs. Mobile phone apps. Use apps on your mobile phone or tablet that can help you stick to your quit plan. Examples of free services include Quit Guide from the CDC and smokefree.gov  What can I do to make it easier to quit?  Talk to your family and friends. Ask them to support and encourage you. Call a phone quitline, such as 1-800-QUIT-NOW, reach out to support groups, or work with a counselor. Ask people who smoke to not smoke around you. Avoid places that make you want to smoke, such as: Bars. Parties. Smoke-break areas at work. Spend time with people who do not smoke. Lower   the stress in your life. Stress can make you want to smoke. Try these things to lower stress: Getting regular exercise. Doing deep-breathing exercises. Doing yoga. Meditating. What benefits will I see if I quit smoking? Over time, you may have: A better sense of smell and taste. Less coughing and sore throat. A slower heart rate. Lower blood pressure. Clearer skin. Better breathing. Fewer sick days. Summary Quitting smoking can be hard, but it is one of  the best things that you can do for your health. Do not give up if you cannot quit the first time. Some people need to try many times to quit. When you decide to quit smoking, make a plan to help you succeed. Quit smoking right away, not slowly over a period of time. When you start quitting, get help and support to keep you smoke-free. This information is not intended to replace advice given to you by your health care provider. Make sure you discuss any questions you have with your health care provider. Document Revised: 03/29/2021 Document Reviewed: 03/29/2021 [employer] Patient Education  2023 [employer]  

## 2022-08-13 NOTE — Progress Notes (Signed)
Patient seen by Dr. Mohamed  Vitals are within treatment parameters.  Labs reviewed: and are within treatment parameters.  Per physician team, patient is ready for treatment and there are NO modifications to the treatment plan.  

## 2022-08-13 NOTE — Progress Notes (Deleted)
Patient seen by Dr. Mohamed  Vitals are within treatment parameters.  Labs reviewed: and are within treatment parameters.  Per physician team, patient is ready for treatment and there are NO modifications to the treatment plan.  

## 2022-08-13 NOTE — Patient Instructions (Signed)
[facility]  Discharge Instructions: Thank you for choosing [facility] to provide your oncology and hematology care.   If you have a lab appointment with the Cancer Center, please go directly to the Cancer Center and check in at the registration area.   Wear comfortable clothing and clothing appropriate for easy access to any Portacath or PICC line.   We strive to give you quality time with your provider. You may need to reschedule your appointment if you arrive late (15 or more minutes).  Arriving late affects you and other patients whose appointments are after yours.  Also, if you miss three or more appointments without notifying the office, you may be dismissed from the clinic at the provider's discretion.      For prescription refill requests, have your pharmacy contact our office and allow 72 hours for refills to be completed.    Today you received the following chemotherapy and/or immunotherapy agents keytruda , alimta      To help prevent nausea and vomiting after your treatment, we encourage you to take your nausea medication as directed.  BELOW ARE SYMPTOMS THAT SHOULD BE REPORTED IMMEDIATELY: *FEVER GREATER THAN 100.4 F (38 C) OR HIGHER *CHILLS OR SWEATING *NAUSEA AND VOMITING THAT IS NOT CONTROLLED WITH YOUR NAUSEA MEDICATION *UNUSUAL SHORTNESS OF BREATH *UNUSUAL BRUISING OR BLEEDING *URINARY PROBLEMS (pain or burning when urinating, or frequent urination) *BOWEL PROBLEMS (unusual diarrhea, constipation, pain near the anus) TENDERNESS IN MOUTH AND THROAT WITH OR WITHOUT PRESENCE OF ULCERS (sore throat, sores in mouth, or a toothache) UNUSUAL RASH, SWELLING OR PAIN  UNUSUAL VAGINAL DISCHARGE OR ITCHING   Items with * indicate a potential emergency and should be followed up as soon as possible or go to the Emergency Department if any problems should occur.  Please show the CHEMOTHERAPY ALERT CARD or IMMUNOTHERAPY ALERT CARD  at check-in to the Emergency Department and triage nurse.  Should you have questions after your visit or need to cancel or reschedule your appointment, please contact [facility]  Dept: [phone]  and follow the prompts.  Office hours are 8:00 a.m. to 4:30 p.m. Monday - Friday. Please note that voicemails left after 4:00 p.m. may not be returned until the following business day.  We are closed weekends and major holidays. You have access to a nurse at all times for urgent questions. Please call the main number to the clinic Dept: [phone] and follow the prompts.   For any non-urgent questions, you may also contact your provider using MyChart. We now offer e-Visits for anyone 18 and older to request care online for non-urgent symptoms. For details visit [email or web address].   Also download the MyChart app! Go to the app store, search "MyChart", open the app, select [facility], and log in with your MyChart username and password.   
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
## 2022-08-15 ENCOUNTER — Encounter (INDEPENDENT_AMBULATORY_CARE_PROVIDER_SITE_OTHER): Payer: Self-pay | Admitting: *Deleted

## 2022-08-15 LAB — T4: T4, Total: 10.1 ug/dL (ref 4.5–12.0)

## 2022-08-19 ENCOUNTER — Other Ambulatory Visit: Payer: Self-pay

## 2022-08-26 ENCOUNTER — Encounter: Payer: Self-pay | Admitting: Medical Oncology
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
## 2022-09-01 ENCOUNTER — Ambulatory Visit (INDEPENDENT_AMBULATORY_CARE_PROVIDER_SITE_OTHER): Payer: Medicare Other | Admitting: Ophthalmology

## 2022-09-01 ENCOUNTER — Ambulatory Visit (HOSPITAL_COMMUNITY)
Admission: RE | Admit: 2022-09-01 | Discharge: 2022-09-01 | Disposition: A | Discharge status: Home / Self Care | Payer: Medicare Other | Source: Ambulatory Visit | Attending: Internal Medicine | Admitting: Internal Medicine

## 2022-09-01 ENCOUNTER — Encounter (INDEPENDENT_AMBULATORY_CARE_PROVIDER_SITE_OTHER): Payer: Self-pay | Admitting: Ophthalmology

## 2022-09-01 ENCOUNTER — Encounter (HOSPITAL_COMMUNITY): Payer: Self-pay

## 2022-09-01 DIAGNOSIS — Z961 Presence of intraocular lens: Secondary | ICD-10-CM

## 2022-09-01 DIAGNOSIS — C3490 Malignant neoplasm of unspecified part of unspecified bronchus or lung: Secondary | ICD-10-CM | POA: Diagnosis present

## 2022-09-01 DIAGNOSIS — H35711 Central serous chorioretinopathy, right eye: Secondary | ICD-10-CM

## 2022-09-01 DIAGNOSIS — C3491 Malignant neoplasm of unspecified part of right bronchus or lung: Secondary | ICD-10-CM

## 2022-09-01 MED ORDER — HEPARIN SOD (PORK) LOCK FLUSH 100 UNIT/ML IV SOLN
INTRAVENOUS | Status: AC
  Filled 2022-09-01: qty 5

## 2022-09-01 MED ORDER — SODIUM CHLORIDE (PF) 0.9 % IJ SOLN
INTRAMUSCULAR | Status: AC
  Filled 2022-09-01: qty 50

## 2022-09-01 MED ORDER — HEPARIN SOD (PORK) LOCK FLUSH 100 UNIT/ML IV SOLN
500.0000 [IU] | Freq: Once | INTRAVENOUS | Status: AC
  Administered 2022-09-01: 500 [IU] via INTRAVENOUS

## 2022-09-01 MED ORDER — IOHEXOL 300 MG/ML  SOLN
100.0000 mL | Freq: Once | INTRAMUSCULAR | Status: AC | PRN
  Administered 2022-09-01: 100 mL via INTRAVENOUS

## 2022-09-02 ENCOUNTER — Other Ambulatory Visit: Payer: Self-pay

## 2022-09-03 ENCOUNTER — Encounter: Payer: Self-pay | Admitting: Medical Oncology

## 2022-09-03 ENCOUNTER — Inpatient Hospital Stay: Payer: Medicare Other

## 2022-09-03 ENCOUNTER — Inpatient Hospital Stay (HOSPITAL_BASED_OUTPATIENT_CLINIC_OR_DEPARTMENT_OTHER): Payer: Medicare Other | Admitting: Internal Medicine

## 2022-09-03 ENCOUNTER — Inpatient Hospital Stay: Payer: Medicare Other | Attending: Internal Medicine

## 2022-09-03 ENCOUNTER — Encounter: Payer: Self-pay | Admitting: Internal Medicine

## 2022-09-03 DIAGNOSIS — C3491 Malignant neoplasm of unspecified part of right bronchus or lung: Secondary | ICD-10-CM

## 2022-09-03 DIAGNOSIS — Z79899 Other long term (current) drug therapy: Secondary | ICD-10-CM | POA: Diagnosis not present

## 2022-09-03 DIAGNOSIS — D649 Anemia, unspecified: Secondary | ICD-10-CM | POA: Insufficient documentation

## 2022-09-03 DIAGNOSIS — Z90710 Acquired absence of both cervix and uterus: Secondary | ICD-10-CM | POA: Diagnosis not present

## 2022-09-03 DIAGNOSIS — Z5111 Encounter for antineoplastic chemotherapy: Secondary | ICD-10-CM | POA: Diagnosis present

## 2022-09-03 DIAGNOSIS — C3411 Malignant neoplasm of upper lobe, right bronchus or lung: Secondary | ICD-10-CM | POA: Diagnosis present

## 2022-09-03 DIAGNOSIS — C778 Secondary and unspecified malignant neoplasm of lymph nodes of multiple regions: Secondary | ICD-10-CM | POA: Diagnosis not present

## 2022-09-03 DIAGNOSIS — Z90722 Acquired absence of ovaries, bilateral: Secondary | ICD-10-CM | POA: Insufficient documentation

## 2022-09-03 DIAGNOSIS — Z5112 Encounter for antineoplastic immunotherapy: Secondary | ICD-10-CM | POA: Diagnosis present

## 2022-09-03 DIAGNOSIS — T451X5A Adverse effect of antineoplastic and immunosuppressive drugs, initial encounter: Secondary | ICD-10-CM

## 2022-09-03 LAB — CBC WITH DIFFERENTIAL (CANCER CENTER ONLY)
Abs Immature Granulocytes: 0.02 10*3/uL (ref 0.00–0.07)
Basophils Absolute: 0 10*3/uL (ref 0.0–0.1)
Basophils Relative: 0 %
Eosinophils Absolute: 0.1 10*3/uL (ref 0.0–0.5)
Eosinophils Relative: 2 %
HCT: 33.5 % — ABNORMAL LOW (ref 36.0–46.0)
Hemoglobin: 11.4 g/dL — ABNORMAL LOW (ref 12.0–15.0)
Immature Granulocytes: 0 %
Lymphocytes Relative: 28 %
Lymphs Abs: 1.5 10*3/uL (ref 0.7–4.0)
MCH: 36.7 pg — ABNORMAL HIGH (ref 26.0–34.0)
MCHC: 34 g/dL (ref 30.0–36.0)
MCV: 107.7 fL — ABNORMAL HIGH (ref 80.0–100.0)
Monocytes Absolute: 0.8 10*3/uL (ref 0.1–1.0)
Monocytes Relative: 14 %
Neutro Abs: 3.1 10*3/uL (ref 1.7–7.7)
Neutrophils Relative %: 56 %
Platelet Count: 234 10*3/uL (ref 150–400)
RBC: 3.11 MIL/uL — ABNORMAL LOW (ref 3.87–5.11)
RDW: 17.8 % — ABNORMAL HIGH (ref 11.5–15.5)
WBC Count: 5.6 10*3/uL (ref 4.0–10.5)
nRBC: 0 % (ref 0.0–0.2)

## 2022-09-03 LAB — CMP (CANCER CENTER ONLY)
ALT: 27 U/L (ref 0–44)
AST: 30 U/L (ref 15–41)
Albumin: 3.8 g/dL (ref 3.5–5.0)
Alkaline Phosphatase: 51 U/L (ref 38–126)
Anion gap: 4 — ABNORMAL LOW (ref 5–15)
BUN: 11 mg/dL (ref 8–23)
CO2: 31 mmol/L (ref 22–32)
Calcium: 9.6 mg/dL (ref 8.9–10.3)
Chloride: 108 mmol/L (ref 98–111)
Creatinine: 0.91 mg/dL (ref 0.44–1.00)
GFR, Estimated: >60 mL/min (ref >60)
Glucose, Bld: 94 mg/dL (ref 70–99)
Potassium: 3.7 mmol/L (ref 3.5–5.1)
Sodium: 143 mmol/L (ref 135–145)
Total Bilirubin: 0.4 mg/dL (ref 0.3–1.2)
Total Protein: 6.7 g/dL (ref 6.5–8.1)

## 2022-09-03 MED ORDER — SODIUM CHLORIDE 0.9 % IV SOLN: Freq: Once | INTRAVENOUS | Status: AC

## 2022-09-03 MED ORDER — PROCHLORPERAZINE MALEATE 10 MG PO TABS
10.0000 mg | ORAL_TABLET | Freq: Once | ORAL | Status: AC
  Administered 2022-09-03: 10 mg via ORAL
  Filled 2022-09-03: qty 1

## 2022-09-03 MED ORDER — SODIUM CHLORIDE 0.9 % IV SOLN
400.0000 mg/m2 | Freq: Once | INTRAVENOUS | Status: AC
  Administered 2022-09-03: 700 mg via INTRAVENOUS
  Filled 2022-09-03: qty 20

## 2022-09-03 MED ORDER — SODIUM CHLORIDE 0.9 % IV SOLN
200.0000 mg | Freq: Once | INTRAVENOUS | Status: AC
  Administered 2022-09-03: 200 mg via INTRAVENOUS
  Filled 2022-09-03: qty 200

## 2022-09-03 MED ORDER — HEPARIN SOD (PORK) LOCK FLUSH 100 UNIT/ML IV SOLN: 500.0000 [IU] | Freq: Once | INTRAVENOUS | Status: DC | PRN

## 2022-09-03 MED ORDER — SODIUM CHLORIDE 0.9% FLUSH
10.0000 mL | Freq: Once | INTRAVENOUS | Status: AC
  Administered 2022-09-03: 10 mL

## 2022-09-03 MED ORDER — SODIUM CHLORIDE 0.9% FLUSH: 10.0000 mL | INTRAVENOUS | Status: DC | PRN
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
## 2022-09-03 NOTE — Progress Notes (Signed)
Patient seen by Dr. Mohamed  Vitals are within treatment parameters.  Labs reviewed: and are within treatment parameters.  Per physician team, patient is ready for treatment and there are NO modifications to the treatment plan.  

## 2022-09-03 NOTE — Patient Instructions (Signed)
[facility]  Discharge Instructions: Thank you for choosing [facility] to provide your oncology and hematology care.   If you have a lab appointment with the Cancer Center, please go directly to the Cancer Center and check in at the registration area.   Wear comfortable clothing and clothing appropriate for easy access to any Portacath or PICC line.   We strive to give you quality time with your provider. You may need to reschedule your appointment if you arrive late (15 or more minutes).  Arriving late affects you and other patients whose appointments are after yours.  Also, if you miss three or more appointments without notifying the office, you may be dismissed from the clinic at the provider's discretion.      For prescription refill requests, have your pharmacy contact our office and allow 72 hours for refills to be completed.    Today you received the following chemotherapy and/or immunotherapy agents keytruda , alimta      To help prevent nausea and vomiting after your treatment, we encourage you to take your nausea medication as directed.  BELOW ARE SYMPTOMS THAT SHOULD BE REPORTED IMMEDIATELY: *FEVER GREATER THAN 100.4 F (38 C) OR HIGHER *CHILLS OR SWEATING *NAUSEA AND VOMITING THAT IS NOT CONTROLLED WITH YOUR NAUSEA MEDICATION *UNUSUAL SHORTNESS OF BREATH *UNUSUAL BRUISING OR BLEEDING *URINARY PROBLEMS (pain or burning when urinating, or frequent urination) *BOWEL PROBLEMS (unusual diarrhea, constipation, pain near the anus) TENDERNESS IN MOUTH AND THROAT WITH OR WITHOUT PRESENCE OF ULCERS (sore throat, sores in mouth, or a toothache) UNUSUAL RASH, SWELLING OR PAIN  UNUSUAL VAGINAL DISCHARGE OR ITCHING   Items with * indicate a potential emergency and should be followed up as soon as possible or go to the Emergency Department if any problems should occur.  Please show the CHEMOTHERAPY ALERT CARD or IMMUNOTHERAPY ALERT CARD  at check-in to the Emergency Department and triage nurse.  Should you have questions after your visit or need to cancel or reschedule your appointment, please contact [facility]  Dept: [phone]  and follow the prompts.  Office hours are 8:00 a.m. to 4:30 p.m. Monday - Friday. Please note that voicemails left after 4:00 p.m. may not be returned until the following business day.  We are closed weekends and major holidays. You have access to a nurse at all times for urgent questions. Please call the main number to the clinic Dept: [phone] and follow the prompts.   For any non-urgent questions, you may also contact your provider using MyChart. We now offer e-Visits for anyone 18 and older to request care online for non-urgent symptoms. For details visit [email or web address].   Also download the MyChart app! Go to the app store, search "MyChart", open the app, select [facility], and log in with your MyChart username and password.   

## 2022-09-09 ENCOUNTER — Telehealth: Payer: Self-pay | Admitting: Medical Oncology

## 2022-09-09 ENCOUNTER — Other Ambulatory Visit: Payer: Self-pay

## 2022-09-09 NOTE — Telephone Encounter (Signed)
Very concerned about edema in ankles and what is causing it. She is taking her cardiac meds. She is elevating legs . Is it from the Alimta or Keytruda?

## 2022-09-09 NOTE — Telephone Encounter (Signed)
Message sent to change lab appt to port flush with labs.

## 2022-09-10 ENCOUNTER — Other Ambulatory Visit: Payer: Self-pay

## 2022-09-16 ENCOUNTER — Telehealth: Payer: Self-pay | Admitting: Medical Oncology

## 2022-09-16 NOTE — Telephone Encounter (Addendum)
I called pt to f/u swelling . She said it is better. Per Dr. Arbutus Ped, pt was told that her edema is " It is likely from Alimta but I will be happy to give her few tablets of Lasix to use on as-needed basis"  . Yvette Curry said she has plenty of Lasix but her cardiologist does not want her to take it or HCTZ. " I guess I will just have to live with some swelling".

## 2022-09-16 NOTE — Telephone Encounter (Signed)
Pt notified that Alimta may be reason for swelling in feet.

## 2022-09-24 ENCOUNTER — Inpatient Hospital Stay: Payer: Medicare Other | Attending: Internal Medicine | Admitting: Internal Medicine

## 2022-09-24 ENCOUNTER — Encounter: Payer: Self-pay | Admitting: Medical Oncology

## 2022-09-24 ENCOUNTER — Inpatient Hospital Stay: Payer: Medicare Other

## 2022-09-24 ENCOUNTER — Other Ambulatory Visit: Payer: Self-pay

## 2022-09-24 ENCOUNTER — Other Ambulatory Visit: Payer: Medicare Other

## 2022-09-24 VITALS — BP 130/94 | HR 88 | Temp 98.5°F | Resp 16 | Wt 151.0 lb

## 2022-09-24 DIAGNOSIS — C3411 Malignant neoplasm of upper lobe, right bronchus or lung: Secondary | ICD-10-CM | POA: Insufficient documentation

## 2022-09-24 DIAGNOSIS — Z5111 Encounter for antineoplastic chemotherapy: Secondary | ICD-10-CM | POA: Insufficient documentation

## 2022-09-24 DIAGNOSIS — D649 Anemia, unspecified: Secondary | ICD-10-CM | POA: Diagnosis not present

## 2022-09-24 DIAGNOSIS — C786 Secondary malignant neoplasm of retroperitoneum and peritoneum: Secondary | ICD-10-CM | POA: Insufficient documentation

## 2022-09-24 DIAGNOSIS — C773 Secondary and unspecified malignant neoplasm of axilla and upper limb lymph nodes: Secondary | ICD-10-CM | POA: Diagnosis not present

## 2022-09-24 DIAGNOSIS — C781 Secondary malignant neoplasm of mediastinum: Secondary | ICD-10-CM | POA: Insufficient documentation

## 2022-09-24 DIAGNOSIS — Z90710 Acquired absence of both cervix and uterus: Secondary | ICD-10-CM | POA: Insufficient documentation

## 2022-09-24 DIAGNOSIS — C3491 Malignant neoplasm of unspecified part of right bronchus or lung: Secondary | ICD-10-CM

## 2022-09-24 DIAGNOSIS — T451X5A Adverse effect of antineoplastic and immunosuppressive drugs, initial encounter: Secondary | ICD-10-CM

## 2022-09-24 DIAGNOSIS — Z79899 Other long term (current) drug therapy: Secondary | ICD-10-CM | POA: Insufficient documentation

## 2022-09-24 DIAGNOSIS — Z5112 Encounter for antineoplastic immunotherapy: Secondary | ICD-10-CM | POA: Insufficient documentation

## 2022-09-24 DIAGNOSIS — Z90722 Acquired absence of ovaries, bilateral: Secondary | ICD-10-CM | POA: Insufficient documentation

## 2022-09-24 LAB — CBC WITH DIFFERENTIAL (CANCER CENTER ONLY)
Abs Immature Granulocytes: 0.05 10*3/uL (ref 0.00–0.07)
Basophils Absolute: 0 10*3/uL (ref 0.0–0.1)
Basophils Relative: 0 %
Eosinophils Absolute: 0.2 10*3/uL (ref 0.0–0.5)
Eosinophils Relative: 2 %
HCT: 32.7 % — ABNORMAL LOW (ref 36.0–46.0)
Hemoglobin: 11.3 g/dL — ABNORMAL LOW (ref 12.0–15.0)
Immature Granulocytes: 1 %
Lymphocytes Relative: 28 %
Lymphs Abs: 2.1 10*3/uL (ref 0.7–4.0)
MCH: 37.8 pg — ABNORMAL HIGH (ref 26.0–34.0)
MCHC: 34.6 g/dL (ref 30.0–36.0)
MCV: 109.4 fL — ABNORMAL HIGH (ref 80.0–100.0)
Monocytes Absolute: 1 10*3/uL (ref 0.1–1.0)
Monocytes Relative: 14 %
Neutro Abs: 4.1 10*3/uL (ref 1.7–7.7)
Neutrophils Relative %: 55 %
Platelet Count: 253 10*3/uL (ref 150–400)
RBC: 2.99 MIL/uL — ABNORMAL LOW (ref 3.87–5.11)
RDW: 17.1 % — ABNORMAL HIGH (ref 11.5–15.5)
WBC Count: 7.4 10*3/uL (ref 4.0–10.5)
nRBC: 0 % (ref 0.0–0.2)

## 2022-09-24 LAB — CMP (CANCER CENTER ONLY)
ALT: 23 U/L (ref 0–44)
AST: 30 U/L (ref 15–41)
Albumin: 3.8 g/dL (ref 3.5–5.0)
Alkaline Phosphatase: 56 U/L (ref 38–126)
Anion gap: 5 (ref 5–15)
BUN: 18 mg/dL (ref 8–23)
CO2: 30 mmol/L (ref 22–32)
Calcium: 10 mg/dL (ref 8.9–10.3)
Chloride: 107 mmol/L (ref 98–111)
Creatinine: 0.98 mg/dL (ref 0.44–1.00)
GFR, Estimated: >60 mL/min (ref >60)
Glucose, Bld: 94 mg/dL (ref 70–99)
Potassium: 4.2 mmol/L (ref 3.5–5.1)
Sodium: 142 mmol/L (ref 135–145)
Total Bilirubin: 0.4 mg/dL (ref 0.3–1.2)
Total Protein: 7.1 g/dL (ref 6.5–8.1)

## 2022-09-24 MED ORDER — HEPARIN SOD (PORK) LOCK FLUSH 100 UNIT/ML IV SOLN
500.0000 [IU] | Freq: Once | INTRAVENOUS | Status: AC | PRN
  Administered 2022-09-24: 500 [IU]

## 2022-09-24 MED ORDER — PROCHLORPERAZINE MALEATE 10 MG PO TABS: 10.0000 mg | ORAL_TABLET | Freq: Once | ORAL | Status: DC

## 2022-09-24 MED ORDER — SODIUM CHLORIDE 0.9 % IV SOLN
200.0000 mg | Freq: Once | INTRAVENOUS | Status: AC
  Administered 2022-09-24: 200 mg via INTRAVENOUS
  Filled 2022-09-24: qty 200

## 2022-09-24 MED ORDER — SODIUM CHLORIDE 0.9 % IV SOLN
400.0000 mg/m2 | Freq: Once | INTRAVENOUS | Status: AC
  Administered 2022-09-24: 700 mg via INTRAVENOUS
  Filled 2022-09-24: qty 20

## 2022-09-24 MED ORDER — SODIUM CHLORIDE 0.9% FLUSH
10.0000 mL | INTRAVENOUS | Status: DC | PRN
  Administered 2022-09-24: 10 mL

## 2022-09-24 MED ORDER — SODIUM CHLORIDE 0.9 % IV SOLN: Freq: Once | INTRAVENOUS | Status: AC

## 2022-09-24 MED ORDER — SODIUM CHLORIDE 0.9% FLUSH
10.0000 mL | Freq: Once | INTRAVENOUS | Status: AC
  Administered 2022-09-24: 10 mL

## 2022-09-24 NOTE — Progress Notes (Signed)
Patient seen by within treatment parameters.  Vitals are within treatment parameters.  Labs reviewed: and are within treatment parameters.  Per physician team, patient is ready for treatment and there are NO modifications to the treatment plan.  
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
## 2022-09-24 NOTE — Patient Instructions (Signed)
[facility]  Discharge Instructions: Thank you for choosing [facility] to provide your oncology and hematology care.   If you have a lab appointment with the Cancer Center, please go directly to the Cancer Center and check in at the registration area.   Wear comfortable clothing and clothing appropriate for easy access to any Portacath or PICC line.   We strive to give you quality time with your provider. You may need to reschedule your appointment if you arrive late (15 or more minutes).  Arriving late affects you and other patients whose appointments are after yours.  Also, if you miss three or more appointments without notifying the office, you may be dismissed from the clinic at the provider's discretion.      For prescription refill requests, have your pharmacy contact our office and allow 72 hours for refills to be completed.    Today you received the following chemotherapy and/or immunotherapy agents :  Pembrolizumab & Pemetrexed.       To help prevent nausea and vomiting after your treatment, we encourage you to take your nausea medication as directed.  BELOW ARE SYMPTOMS THAT SHOULD BE REPORTED IMMEDIATELY: *FEVER GREATER THAN 100.4 F (38 C) OR HIGHER *CHILLS OR SWEATING *NAUSEA AND VOMITING THAT IS NOT CONTROLLED WITH YOUR NAUSEA MEDICATION *UNUSUAL SHORTNESS OF BREATH *UNUSUAL BRUISING OR BLEEDING *URINARY PROBLEMS (pain or burning when urinating, or frequent urination) *BOWEL PROBLEMS (unusual diarrhea, constipation, pain near the anus) TENDERNESS IN MOUTH AND THROAT WITH OR WITHOUT PRESENCE OF ULCERS (sore throat, sores in mouth, or a toothache) UNUSUAL RASH, SWELLING OR PAIN  UNUSUAL VAGINAL DISCHARGE OR ITCHING   Items with * indicate a potential emergency and should be followed up as soon as possible or go to the Emergency Department if any problems should occur.  Please show the CHEMOTHERAPY ALERT CARD or  IMMUNOTHERAPY ALERT CARD at check-in to the Emergency Department and triage nurse.  Should you have questions after your visit or need to cancel or reschedule your appointment, please contact [facility]  Dept: [phone]  and follow the prompts.  Office hours are 8:00 a.m. to 4:30 p.m. Monday - Friday. Please note that voicemails left after 4:00 p.m. may not be returned until the following business day.  We are closed weekends and major holidays. You have access to a nurse at all times for urgent questions. Please call the main number to the clinic Dept: [phone] and follow the prompts.   For any non-urgent questions, you may also contact your provider using MyChart. We now offer e-Visits for anyone 18 and older to request care online for non-urgent symptoms. For details visit [email or web address].   Also download the MyChart app! Go to the app store, search "MyChart", open the app, select [place], and log in with your MyChart username and password.   

## 2022-09-29 ENCOUNTER — Ambulatory Visit (INDEPENDENT_AMBULATORY_CARE_PROVIDER_SITE_OTHER): Payer: Medicare Other | Admitting: Surgery

## 2022-09-29 ENCOUNTER — Encounter: Payer: Self-pay | Admitting: Surgery

## 2022-09-29 VITALS — BP 122/71 | HR 103 | Temp 98.7°F | Resp 20 | Ht 61.0 in | Wt 149.0 lb

## 2022-09-29 DIAGNOSIS — I872 Venous insufficiency (chronic) (peripheral): Secondary | ICD-10-CM | POA: Diagnosis not present

## 2022-09-29 NOTE — Progress Notes (Signed)
[facility] [place]  Patient name: Yvette Curry MRN: [id number] DOB: 07/28/1955 Sex: female   REASON FOR VISIT:    Follow up  HISOTRY OF PRESENT ILLNESS:    Yvette Curry is a 67 y.o. female who was initially evaluated in the [facility] clinic on 07/01/2022 for left leg swelling.  She underwent DVT imaging which was unremarkable.  She was given Lasix as well as compression socks without significant relief.  She complained of skin discoloration on both sides with significant leg pain.  She has difficulty getting her pression socks on.  The patient has a history of stage IV non-small cell lung cancer,  adenocarcinoma in the right upper lobe in addition to widespread metastatic adenopathy to the ipsilateral hilum, bilateral mediastinum, bilateral neck, left axilla and left mesentery.  This was diagnosed in August 2023 she has undergone chemotherapy   PAST MEDICAL HISTORY:   Past Medical History:  Diagnosis Date   Anxiety    no current tx   Depression    no meds at present   Dyspnea    Family history of adverse reaction to anesthesia    pt states mom had allergic reaction to some unknown anesthesia   GERD (gastroesophageal reflux disease)    no tx since weight loss   Hypercholesteremia    Osteoarthritis    stage IV lung ca 11/2021     FAMILY HISTORY:   Family History  Problem Relation Age of Onset   Heart disease Mother    Kidney cancer Mother    Emphysema Father    Heart disease Father    Colon cancer Neg Hx     SOCIAL HISTORY:   Social History   Tobacco Use   Smoking status: Every Day    Packs/day: 0.50    Years: 37.00    Additional pack years: 0.00    Total pack years: 18.50    Types: Cigarettes    Passive exposure: Current   Smokeless tobacco: Never  Substance Use Topics   Alcohol use: No     ALLERGIES:   Allergies  Allergen Reactions   Lidocaine Shortness Of Breath and Anxiety    Patient  felt like she "couldn't breathe", panicky Allergic to all " caines"   Mepivacaine Swelling    angioedema   Demerol Nausea And Vomiting   Prednisone Hives and Nausea And Vomiting    abd pain and vomiting, Hives    Sulfa Antibiotics Hives    Hives, swelling and itching     CURRENT MEDICATIONS:   Current Outpatient Medications  Medication Sig Dispense Refill   ascorbic acid (VITAMIN C) 500 MG tablet Take by mouth.     Chlorpheniramine Maleate (ALLERGY PO) Take 1 tablet by mouth daily as needed (sinus headaches).     Cholecalciferol (VITAMIN D-3) 25 MCG (1000 UT) CAPS Take 1 capsule by mouth daily.     ELDERBERRY PO Take 5 mLs by mouth every other day.     ferrous sulfate 324 MG TBEC Take 324 mg by mouth.     folic acid (FOLVITE) 1 MG tablet TAKE 1 TABLET BY MOUTH EVERY DAY 30 tablet 4   metoprolol tartrate (LOPRESSOR) 25 MG tablet Take 12.5 mg by mouth daily.     nicotine (NICODERM CQ) 21 mg/24hr patch Place 1 patch (21 mg total) onto the skin daily. 28 patch 6   OVER THE COUNTER MEDICATION Take 1 Dose by mouth daily. "EMMA" purchased online-Multivitamin     OVER  THE COUNTER MEDICATION Vitamin D 3, vitamin K, magnesium, zinc, boron     pantoprazole (PROTONIX) 40 MG tablet TAKE 1 TABLET BY MOUTH EVERY DAY BEFORE BREAKFAST 90 tablet 1   prochlorperazine (COMPAZINE) 10 MG tablet Take 10 mg by mouth every 6 (six) hours as needed for nausea or vomiting.     Propylene Glycol (SYSTANE BALANCE) 0.6 % SOLN Place 1 drop into both eyes daily as needed (dry eyes).     triamcinolone ointment (KENALOG) 0.5 % APPLY TO AFFECTED AREA TWICE A DAY 30 g 0   No current facility-administered medications for this visit.    REVIEW OF SYSTEMS:   [X]  denotes positive finding, [ ]  denotes negative finding Cardiac  Comments:  Chest pain or chest pressure:    Shortness of breath upon exertion:    Short of breath when lying flat:    Irregular heart rhythm:        Vascular    Pain in calf, thigh, or hip  brought on by ambulation:    Pain in feet at night that wakes you up from your sleep:     Blood clot in your veins:    Leg swelling:  x       Pulmonary    Oxygen at home:    Productive cough:     Wheezing:         Neurologic    Sudden weakness in arms or legs:     Sudden numbness in arms or legs:     Sudden onset of difficulty speaking or slurred speech:    Temporary loss of vision in one eye:     Problems with dizziness:         Gastrointestinal    Blood in stool:     Vomited blood:         Genitourinary    Burning when urinating:     Blood in urine:        Psychiatric    Major depression:         Hematologic    Bleeding problems:    Problems with blood clotting too easily:        Skin    Rashes or ulcers:        Constitutional    Fever or chills:      PHYSICAL EXAM:   Vitals:   09/29/22 1449  BP: 122/71  Pulse: (!) 103  Resp: 20  Temp: 98.7 F (37.1 C)  SpO2: 96%  Weight: 149 lb (67.6 kg)  Height: 5\' 1"  (1.549 m)    GENERAL: The patient is a well-nourished female, in no acute distress. The vital signs are documented above. CARDIAC: There is a regular rate and rhythm.  VASCULAR: Bilateral edema.  Palpable pedal pulses.  Splotchy hyperpigmentation bilaterally PULMONARY: Non-labored respirations MUSCULOSKELETAL: There are no major deformities or cyanosis. NEUROLOGIC: No focal weakness or paresthesias are detected. SKIN: There are no ulcers or rashes noted. PSYCHIATRIC: The patient has a normal affect.  STUDIES:   I have reviewed the following venous reflux study: Venous Reflux Times  +--------------+---------+------+-----------+------------+-----------------  ----+  RIGHT        Reflux NoRefluxReflux TimeDiameter cmsComments                                        Yes                                                 +--------------+---------+------+-----------+------------+-----------------  ----+  CFV                    yes    >1 second                                     +--------------+---------+------+-----------+------------+-----------------  ----+  FV mid                  yes   >1 second                                     +--------------+---------+------+-----------+------------+-----------------  ----+  Popliteal    no                                                            +--------------+---------+------+-----------+------------+-----------------  ----+  GSV at [facility]              yes    >500 ms      0.98                            +--------------+---------+------+-----------+------------+-----------------  ----+  GSV prox thigh          yes    >500 ms      0.52                            +--------------+---------+------+-----------+------------+-----------------  ----+  GSV mid thigh           yes    >500 ms      0.53                            +--------------+---------+------+-----------+------------+-----------------  ----+  GSV dist thigh          yes    >500 ms      0.50                            +--------------+---------+------+-----------+------------+-----------------  ----+  GSV at knee             yes    >500 ms      0.42                            +--------------+---------+------+-----------+------------+-----------------  ----+  GSV prox calf           yes    >500 ms      0.36                            +--------------+---------+------+-----------+------------+-----------------  ----+  GSV mid calf            yes    >500 ms      0.28                            +--------------+---------+------+-----------+------------+-----------------  ----+  SSV Pop Fossa no  0.20    Does not drain  into                                                        popliteal vein          +--------------+---------+------+-----------+------------+-----------------  ----+  SSV prox calf            yes    >500 ms      0.21                            +--------------+---------+------+-----------+------------+-----------------  ----+  SSV mid calf            yes    >500 ms      0.21                            +--------------+---------+------+-----------+------------+-----------------  ----+         Summary:  Right:  - No evidence of deep vein thrombosis seen in the right lower extremity,  from the common femoral through the popliteal veins.  - No evidence of superficial venous thrombosis in the right lower  extremity.    - The deep venous system is incompetent.  - The great saphenous vein is incompetent.  - The small saphenous vein is incompetent.  MEDICAL ISSUES:   Chronic venous insufficiency: The patient's leg swelling symptoms began around the time she started chemotherapy.  I suspect that this is the most likely explanation for her challenges in her legs with the skin discoloration.  She does have underlying venous insufficiency, however because of everything that she has got going on I do not think that laser ablation to treat her saphenous incompetence would give her over-the-counter symptomatic relief she is anticipating.  I had a lengthy conversation with the patient and her husband.  I think she is best treated with compression socks.  We discussed getting zippered compression to help get them on.  Also discussed the importance of walking and getting outside into a new environment to help her mental clarity as well.  She knows to contact me should she develop a change in symptoms.  Otherwise, I will see her on an as-needed basis    Charlena Cross, MD, FACS [facility] [phone] Pager [phone]

## 2022-09-30 ENCOUNTER — Other Ambulatory Visit: Payer: Self-pay

## 2022-10-02 ENCOUNTER — Telehealth: Payer: Self-pay | Admitting: Medical Oncology

## 2022-10-02 ENCOUNTER — Other Ambulatory Visit: Payer: Self-pay | Admitting: Internal Medicine

## 2022-10-02 NOTE — Telephone Encounter (Signed)
Dental procedure July 9th she has an appt for a dental crown. Is this a good date to get the crown with chemo on  July 17th?  I told Yvette Curry the time should be fine for the dental crown and  to keep appt July 17th.

## 2022-10-09 ENCOUNTER — Ambulatory Visit: Payer: Medicare Other | Attending: Internal Medicine | Admitting: Internal Medicine

## 2022-10-09 ENCOUNTER — Telehealth: Payer: Self-pay | Admitting: Internal Medicine

## 2022-10-09 ENCOUNTER — Encounter: Payer: Self-pay | Admitting: Internal Medicine

## 2022-10-09 ENCOUNTER — Ambulatory Visit (INDEPENDENT_AMBULATORY_CARE_PROVIDER_SITE_OTHER): Payer: Medicare Other

## 2022-10-09 VITALS — BP 106/70 | HR 106 | Ht 61.0 in | Wt 149.0 lb

## 2022-10-09 DIAGNOSIS — R42 Dizziness and giddiness: Secondary | ICD-10-CM

## 2022-10-09 DIAGNOSIS — R0602 Shortness of breath: Secondary | ICD-10-CM | POA: Diagnosis not present

## 2022-10-09 DIAGNOSIS — I429 Cardiomyopathy, unspecified: Secondary | ICD-10-CM | POA: Insufficient documentation
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
## 2022-10-09 NOTE — Patient Instructions (Addendum)
Medication Instructions:  Your physician recommends that you continue on your current medications as directed. Please refer to the Current Medication list given to you today.   Labwork: None  Testing/Procedures: Your physician has recommended that you have a pulmonary function test. Pulmonary Function Tests are a group of tests that measure how well air moves in and out of your lungs.  Your physician has recommended that you wear a Zio monitor.   This monitor is a medical device that records the heart's electrical activity. Doctors most often use these monitors to diagnose arrhythmias. Arrhythmias are problems with the speed or rhythm of the heartbeat. The monitor is a small device applied to your chest. You can wear one while you do your normal daily activities. While wearing this monitor if you have any symptoms to push the button and record what you felt. Once you have worn this monitor for the period of time provider prescribed (for 7 days), you will return the monitor device in the postage paid box. Once it is returned they will download the data collected and provide [place] with a report which the provider will then review and we will call you with those results. Important tips:  Avoid showering during the first 48 hours of wearing the monitor. Avoid excessive sweating to help maximize wear time. Do not submerge the device, no hot tubs, and no swimming pools. Keep any lotions or oils away from the patch. After 48 hours you may shower with the patch on. Take brief showers with your back facing the shower head.  Do not remove patch once it has been placed because that will interrupt data and decrease adhesive wear time. Push the button when you have any symptoms and write down what you were feeling. Once you have completed wearing your monitor, remove and place into box which has postage paid and place in your outgoing mailbox.  If for some reason you have misplaced your box then call our office  and we can provide another box and/or mail it off for you.   Follow-Up: Your physician recommends that you schedule a follow-up appointment in: 6 months  Any Other Special Instructions Will Be Listed Below (If Applicable).  If you need a refill on your cardiac medications before your next appointment, please call your pharmacy.

## 2022-10-09 NOTE — Telephone Encounter (Signed)
Zio monitor.  Percert

## 2022-10-10 ENCOUNTER — Other Ambulatory Visit: Payer: Self-pay

## 2022-10-14 ENCOUNTER — Telehealth: Payer: Self-pay | Admitting: Internal Medicine

## 2022-10-14 NOTE — Telephone Encounter (Signed)
Called patient back regarding the heart monitor. She was concerned about the gap in between the tape and the monitor. I advised her that it was fine. She stated that she placed some tap on it as well. Let her know that she could also call ZIO they are 24 hours if she has any further questions.

## 2022-10-14 NOTE — Telephone Encounter (Signed)
Patient is requesting to speak with Yvette Curry or Yvette Curry. She says when she was last in the office she was told to ask for a Yvette Curry if she has any questions regarding her event monitor.

## 2022-10-15 ENCOUNTER — Other Ambulatory Visit: Payer: Medicare Other

## 2022-10-15 ENCOUNTER — Encounter: Payer: Self-pay | Admitting: Medical Oncology

## 2022-10-15 ENCOUNTER — Ambulatory Visit: Payer: Medicare Other | Admitting: Physician Assistant

## 2022-10-15 ENCOUNTER — Inpatient Hospital Stay: Payer: Medicare Other

## 2022-10-15 ENCOUNTER — Ambulatory Visit: Payer: Medicare Other

## 2022-10-15 ENCOUNTER — Inpatient Hospital Stay (HOSPITAL_BASED_OUTPATIENT_CLINIC_OR_DEPARTMENT_OTHER): Payer: Medicare Other | Admitting: Internal Medicine

## 2022-10-15 VITALS — BP 124/93 | HR 94 | Temp 97.6°F | Resp 17 | Ht 61.0 in | Wt 149.1 lb

## 2022-10-15 DIAGNOSIS — C3491 Malignant neoplasm of unspecified part of right bronchus or lung: Secondary | ICD-10-CM

## 2022-10-15 DIAGNOSIS — Z5112 Encounter for antineoplastic immunotherapy: Secondary | ICD-10-CM | POA: Diagnosis not present

## 2022-10-15 DIAGNOSIS — C3490 Malignant neoplasm of unspecified part of unspecified bronchus or lung: Secondary | ICD-10-CM

## 2022-10-15 DIAGNOSIS — T451X5A Adverse effect of antineoplastic and immunosuppressive drugs, initial encounter: Secondary | ICD-10-CM

## 2022-10-15 LAB — CMP (CANCER CENTER ONLY)
ALT: 20 U/L (ref 0–44)
AST: 24 U/L (ref 15–41)
Albumin: 3.5 g/dL (ref 3.5–5.0)
Alkaline Phosphatase: 52 U/L (ref 38–126)
Anion gap: 4 — ABNORMAL LOW (ref 5–15)
BUN: 12 mg/dL (ref 8–23)
CO2: 30 mmol/L (ref 22–32)
Calcium: 9.9 mg/dL (ref 8.9–10.3)
Chloride: 109 mmol/L (ref 98–111)
Creatinine: 0.86 mg/dL (ref 0.44–1.00)
GFR, Estimated: >60 mL/min (ref >60)
Glucose, Bld: 95 mg/dL (ref 70–99)
Potassium: 3.9 mmol/L (ref 3.5–5.1)
Sodium: 143 mmol/L (ref 135–145)
Total Bilirubin: 0.3 mg/dL (ref 0.3–1.2)
Total Protein: 6.9 g/dL (ref 6.5–8.1)

## 2022-10-15 LAB — CBC WITH DIFFERENTIAL (CANCER CENTER ONLY)
Abs Immature Granulocytes: 0.03 10*3/uL (ref 0.00–0.07)
Basophils Absolute: 0.1 10*3/uL (ref 0.0–0.1)
Basophils Relative: 1 %
Eosinophils Absolute: 0.1 10*3/uL (ref 0.0–0.5)
Eosinophils Relative: 2 %
HCT: 34.8 % — ABNORMAL LOW (ref 36.0–46.0)
Hemoglobin: 11.4 g/dL — ABNORMAL LOW (ref 12.0–15.0)
Immature Granulocytes: 1 %
Lymphocytes Relative: 33 %
Lymphs Abs: 2.2 10*3/uL (ref 0.7–4.0)
MCH: 36.9 pg — ABNORMAL HIGH (ref 26.0–34.0)
MCHC: 32.8 g/dL (ref 30.0–36.0)
MCV: 112.6 fL — ABNORMAL HIGH (ref 80.0–100.0)
Monocytes Absolute: 0.9 10*3/uL (ref 0.1–1.0)
Monocytes Relative: 13 %
Neutro Abs: 3.2 10*3/uL (ref 1.7–7.7)
Neutrophils Relative %: 50 %
Platelet Count: 265 10*3/uL (ref 150–400)
RBC: 3.09 MIL/uL — ABNORMAL LOW (ref 3.87–5.11)
RDW: 16.6 % — ABNORMAL HIGH (ref 11.5–15.5)
WBC Count: 6.5 10*3/uL (ref 4.0–10.5)
nRBC: 0 % (ref 0.0–0.2)

## 2022-10-15 LAB — TSH: TSH: 2.563 u[IU]/mL (ref 0.350–4.500)

## 2022-10-15 MED ORDER — CYANOCOBALAMIN 1000 MCG/ML IJ SOLN
1000.0000 ug | Freq: Once | INTRAMUSCULAR | Status: AC
  Administered 2022-10-15: 1000 ug via INTRAMUSCULAR
  Filled 2022-10-15: qty 1

## 2022-10-15 MED ORDER — PROCHLORPERAZINE MALEATE 10 MG PO TABS
10.0000 mg | ORAL_TABLET | Freq: Once | ORAL | Status: DC
  Filled 2022-10-15: qty 1

## 2022-10-15 MED ORDER — SODIUM CHLORIDE 0.9 % IV SOLN
200.0000 mg | Freq: Once | INTRAVENOUS | Status: AC
  Administered 2022-10-15: 200 mg via INTRAVENOUS
  Filled 2022-10-15: qty 200

## 2022-10-15 MED ORDER — HEPARIN SOD (PORK) LOCK FLUSH 100 UNIT/ML IV SOLN: 500.0000 [IU] | Freq: Once | INTRAVENOUS | Status: DC | PRN

## 2022-10-15 MED ORDER — SODIUM CHLORIDE 0.9 % IV SOLN
400.0000 mg/m2 | Freq: Once | INTRAVENOUS | Status: AC
  Administered 2022-10-15: 700 mg via INTRAVENOUS
  Filled 2022-10-15: qty 20

## 2022-10-15 MED ORDER — SODIUM CHLORIDE 0.9% FLUSH: 10.0000 mL | INTRAVENOUS | Status: DC | PRN

## 2022-10-15 MED ORDER — SODIUM CHLORIDE 0.9% FLUSH
10.0000 mL | Freq: Once | INTRAVENOUS | Status: AC
  Administered 2022-10-15: 10 mL

## 2022-10-15 MED ORDER — SODIUM CHLORIDE 0.9 % IV SOLN: Freq: Once | INTRAVENOUS | Status: AC

## 2022-10-15 NOTE — Patient Instructions (Signed)
[place] [facility]  Discharge Instructions: Thank you for choosing [facility] to provide your oncology and hematology care.   If you have a lab appointment with the Cancer Center, please go directly to the Cancer Center and check in at the registration area.   Wear comfortable clothing and clothing appropriate for easy access to any Portacath or PICC line.   We strive to give you quality time with your provider. You may need to reschedule your appointment if you arrive late (15 or more minutes).  Arriving late affects you and other patients whose appointments are after yours.  Also, if you miss three or more appointments without notifying the office, you may be dismissed from the clinic at the provider's discretion.      For prescription refill requests, have your pharmacy contact our office and allow 72 hours for refills to be completed.    Today you received the following chemotherapy and/or immunotherapy agents keytruda, alimta, B12      To help prevent nausea and vomiting after your treatment, we encourage you to take your nausea medication as directed.  BELOW ARE SYMPTOMS THAT SHOULD BE REPORTED IMMEDIATELY: *FEVER GREATER THAN 100.4 F (38 C) OR HIGHER *CHILLS OR SWEATING *NAUSEA AND VOMITING THAT IS NOT CONTROLLED WITH YOUR NAUSEA MEDICATION *UNUSUAL SHORTNESS OF BREATH *UNUSUAL BRUISING OR BLEEDING *URINARY PROBLEMS (pain or burning when urinating, or frequent urination) *BOWEL PROBLEMS (unusual diarrhea, constipation, pain near the anus) TENDERNESS IN MOUTH AND THROAT WITH OR WITHOUT PRESENCE OF ULCERS (sore throat, sores in mouth, or a toothache) UNUSUAL RASH, SWELLING OR PAIN  UNUSUAL VAGINAL DISCHARGE OR ITCHING   Items with * indicate a potential emergency and should be followed up as soon as possible or go to the Emergency Department if any problems should occur.  Please show the CHEMOTHERAPY ALERT CARD or IMMUNOTHERAPY ALERT  CARD at check-in to the Emergency Department and triage nurse.  Should you have questions after your visit or need to cancel or reschedule your appointment, please contact [place] [facility]  Dept: [phone]  and follow the prompts.  Office hours are 8:00 a.m. to 4:30 p.m. Monday - Friday. Please note that voicemails left after 4:00 p.m. may not be returned until the following business day.  We are closed weekends and major holidays. You have access to a nurse at all times for urgent questions. Please call the main number to the clinic Dept: [phone] and follow the prompts.   For any non-urgent questions, you may also contact your provider using MyChart. We now offer e-Visits for anyone 28 and older to request care online for non-urgent symptoms. For details visit [email or web address].   Also download the MyChart app! Go to the app store, search "MyChart", open the app, select , and log in with your MyChart username and password.

## 2022-10-15 NOTE — Patient Instructions (Signed)

## 2022-10-15 NOTE — Progress Notes (Signed)
Patient seen by Dr. Mohamed  Vitals are within treatment parameters.  Labs reviewed: and are within treatment parameters.  Per physician team, patient is ready for treatment and there are NO modifications to the treatment plan. Keytruda and Alimta.   
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
## 2022-10-16 ENCOUNTER — Encounter (HOSPITAL_COMMUNITY): Payer: Medicare Other

## 2022-10-16 LAB — T4: T4, Total: 8.8 ug/dL (ref 4.5–12.0)

## 2022-10-20 ENCOUNTER — Observation Stay (HOSPITAL_COMMUNITY)
Admission: EM | Admit: 2022-10-20 | Discharge: 2022-10-21 | Disposition: A | Discharge status: Home / Self Care | Payer: Medicare Other | Attending: Family Medicine | Admitting: Family Medicine

## 2022-10-20 ENCOUNTER — Other Ambulatory Visit: Payer: Self-pay

## 2022-10-20 ENCOUNTER — Emergency Department (HOSPITAL_COMMUNITY): Payer: Medicare Other

## 2022-10-20 ENCOUNTER — Telehealth: Payer: Self-pay | Admitting: Medical Oncology

## 2022-10-20 ENCOUNTER — Encounter (HOSPITAL_COMMUNITY): Payer: Self-pay | Admitting: Emergency Medicine

## 2022-10-20 DIAGNOSIS — Z79899 Other long term (current) drug therapy: Secondary | ICD-10-CM | POA: Diagnosis not present

## 2022-10-20 DIAGNOSIS — D61818 Other pancytopenia: Secondary | ICD-10-CM | POA: Diagnosis present

## 2022-10-20 DIAGNOSIS — R0602 Shortness of breath: Secondary | ICD-10-CM | POA: Diagnosis present

## 2022-10-20 DIAGNOSIS — E876 Hypokalemia: Secondary | ICD-10-CM | POA: Insufficient documentation

## 2022-10-20 DIAGNOSIS — I959 Hypotension, unspecified: Secondary | ICD-10-CM | POA: Diagnosis present

## 2022-10-20 DIAGNOSIS — Z720 Tobacco use: Secondary | ICD-10-CM | POA: Diagnosis present

## 2022-10-20 DIAGNOSIS — I10 Essential (primary) hypertension: Secondary | ICD-10-CM | POA: Diagnosis not present

## 2022-10-20 DIAGNOSIS — F17210 Nicotine dependence, cigarettes, uncomplicated: Secondary | ICD-10-CM | POA: Diagnosis not present

## 2022-10-20 DIAGNOSIS — E871 Hypo-osmolality and hyponatremia: Secondary | ICD-10-CM | POA: Diagnosis not present

## 2022-10-20 DIAGNOSIS — C3491 Malignant neoplasm of unspecified part of right bronchus or lung: Secondary | ICD-10-CM | POA: Diagnosis present

## 2022-10-20 DIAGNOSIS — D649 Anemia, unspecified: Secondary | ICD-10-CM | POA: Diagnosis present

## 2022-10-20 DIAGNOSIS — D508 Other iron deficiency anemias: Principal | ICD-10-CM | POA: Insufficient documentation

## 2022-10-20 LAB — CBC WITH DIFFERENTIAL/PLATELET
Abs Immature Granulocytes: 0.02 10*3/uL (ref 0.00–0.07)
Basophils Absolute: 0 10*3/uL (ref 0.0–0.1)
Basophils Relative: 0 %
Eosinophils Absolute: 0.1 10*3/uL (ref 0.0–0.5)
Eosinophils Relative: 1 %
HCT: 26.5 % — ABNORMAL LOW (ref 36.0–46.0)
Hemoglobin: 8.9 g/dL — ABNORMAL LOW (ref 12.0–15.0)
Immature Granulocytes: 0 %
Lymphocytes Relative: 21 %
Lymphs Abs: 1.4 10*3/uL (ref 0.7–4.0)
MCH: 37.6 pg — ABNORMAL HIGH (ref 26.0–34.0)
MCHC: 33.6 g/dL (ref 30.0–36.0)
MCV: 111.8 fL — ABNORMAL HIGH (ref 80.0–100.0)
Monocytes Absolute: 0.2 10*3/uL (ref 0.1–1.0)
Monocytes Relative: 3 %
Neutro Abs: 5.1 10*3/uL (ref 1.7–7.7)
Neutrophils Relative %: 75 %
Platelets: 156 10*3/uL (ref 150–400)
RBC: 2.37 MIL/uL — ABNORMAL LOW (ref 3.87–5.11)
RDW: 15.4 % (ref 11.5–15.5)
WBC Morphology: REACTIVE
WBC: 6.9 10*3/uL (ref 4.0–10.5)
nRBC: 0 % (ref 0.0–0.2)

## 2022-10-20 LAB — COMPREHENSIVE METABOLIC PANEL
ALT: 26 U/L (ref 0–44)
AST: 37 U/L (ref 15–41)
Albumin: 3 g/dL — ABNORMAL LOW (ref 3.5–5.0)
Alkaline Phosphatase: 44 U/L (ref 38–126)
Anion gap: 8 (ref 5–15)
BUN: 20 mg/dL (ref 8–23)
CO2: 23 mmol/L (ref 22–32)
Calcium: 9.1 mg/dL (ref 8.9–10.3)
Chloride: 103 mmol/L (ref 98–111)
Creatinine, Ser: 1.1 mg/dL — ABNORMAL HIGH (ref 0.44–1.00)
GFR, Estimated: 56 mL/min — ABNORMAL LOW (ref >60)
Glucose, Bld: 143 mg/dL — ABNORMAL HIGH (ref 70–99)
Potassium: 3.4 mmol/L — ABNORMAL LOW (ref 3.5–5.1)
Sodium: 134 mmol/L — ABNORMAL LOW (ref 135–145)
Total Bilirubin: 0.6 mg/dL (ref 0.3–1.2)
Total Protein: 6.6 g/dL (ref 6.5–8.1)

## 2022-10-20 LAB — TROPONIN I (HIGH SENSITIVITY)
Troponin I (High Sensitivity): 4 ng/L (ref <18)
Troponin I (High Sensitivity): 4 ng/L (ref <18)

## 2022-10-20 LAB — TYPE AND SCREEN
ABO/RH(D): O POS
Antibody Screen: NEGATIVE

## 2022-10-20 LAB — BRAIN NATRIURETIC PEPTIDE: B Natriuretic Peptide: 10 pg/mL (ref 0.0–100.0)

## 2022-10-20 LAB — POC OCCULT BLOOD, ED: Fecal Occult Bld: NEGATIVE

## 2022-10-20 MED ORDER — SODIUM CHLORIDE 0.9 % IV BOLUS
2000.0000 mL | Freq: Once | INTRAVENOUS | Status: AC
  Administered 2022-10-20: 2000 mL via INTRAVENOUS

## 2022-10-20 MED ORDER — PANTOPRAZOLE SODIUM 40 MG IV SOLR
40.0000 mg | Freq: Once | INTRAVENOUS | Status: AC
  Administered 2022-10-20: 40 mg via INTRAVENOUS
  Filled 2022-10-20: qty 10

## 2022-10-20 MED ORDER — ALBUTEROL SULFATE HFA 108 (90 BASE) MCG/ACT IN AERS: 2.0000 | INHALATION_SPRAY | RESPIRATORY_TRACT | Status: DC | PRN

## 2022-10-20 NOTE — ED Provider Notes (Signed)
[place] [facility] AT [facility] Provider Note   CSN: [id number] Arrival date & time: 10/20/22  2004     History  Chief Complaint  Patient presents with   Hypotension    Yvette Curry is a 67 y.o. female.  Patient has a history of stage IV lung cancer.  She states she has been feeling weak and today her blood pressure has been low.  She had a reading of 70s systolic at home.  When she arrived in the emergency department her blood pressure had some readings in the 80s.  The history is provided by the patient and medical records. No language interpreter was used.  Weakness Severity:  Moderate Onset quality:  Sudden Timing:  Constant Progression:  Worsening Chronicity:  New Context: not alcohol use   Relieved by:  Nothing Worsened by:  Nothing Associated symptoms: no abdominal pain, no chest pain, no cough, no diarrhea, no frequency, no headaches and no seizures        Home Medications Prior to Admission medications   Medication Sig Start Date End Date Taking? Authorizing Provider  amoxicillin (AMOXIL) 875 MG tablet Take 875 mg by mouth 2 (two) times daily. 10/06/22   [provider]  ascorbic acid (VITAMIN C) 500 MG tablet Take by mouth. 03/14/19   [provider]  Chlorpheniramine Maleate (ALLERGY PO) Take 1 tablet by mouth daily as needed (sinus headaches).    [provider]  Cholecalciferol (VITAMIN D-3) 25 MCG (1000 UT) CAPS Take 1 capsule by mouth daily.    [provider]  ELDERBERRY PO Take 5 mLs by mouth every other day.    [provider]  ferrous sulfate 324 MG TBEC Take 324 mg by mouth.    [provider]  folic acid (FOLVITE) 1 MG tablet TAKE 1 TABLET BY MOUTH EVERY DAY 10/02/22   Si Gaul, MD  metoprolol tartrate (LOPRESSOR) 25 MG tablet Take 12.5 mg by mouth daily.    [provider]  nicotine (NICODERM CQ) 21 mg/24hr patch Place 1 patch (21 mg total) onto the skin  daily. 06/04/22   Cook, Verdis Frederickson, DO  OVER THE COUNTER MEDICATION Take 1 Dose by mouth daily. "EMMA" purchased online-Multivitamin    [provider]  OVER THE COUNTER MEDICATION Vitamin D 3, vitamin K, magnesium, zinc, boron    [provider]  pantoprazole (PROTONIX) 40 MG tablet TAKE 1 TABLET BY MOUTH EVERY DAY BEFORE BREAKFAST 05/29/22   Carlan, Chelsea L, NP  prochlorperazine (COMPAZINE) 10 MG tablet Take 10 mg by mouth every 6 (six) hours as needed for nausea or vomiting.    [provider]  Propylene Glycol (SYSTANE BALANCE) 0.6 % SOLN Place 1 drop into both eyes daily as needed (dry eyes).    [provider]  triamcinolone ointment (KENALOG) 0.5 % APPLY TO AFFECTED AREA TWICE A DAY 07/14/22   Everlene Other G, DO      Allergies    Lidocaine, Mepivacaine, Demerol, Prednisone, and Sulfa antibiotics    Review of Systems   Review of Systems  Constitutional:  Negative for appetite change and fatigue.  HENT:  Negative for congestion, ear discharge and sinus pressure.   Eyes:  Negative for discharge.  Respiratory:  Negative for cough.   Cardiovascular:  Negative for chest pain.  Gastrointestinal:  Negative for abdominal pain and diarrhea.  Genitourinary:  Negative for frequency and hematuria.  Musculoskeletal:  Negative for back pain.  Skin:  Negative for rash.  Neurological:  Positive for weakness. Negative for seizures and headaches.  Psychiatric/Behavioral:  Negative for hallucinations.     Physical Exam Updated Vital Signs BP 128/79   Pulse 83   Temp 98.6 F (37 C) (Oral)   Resp 17   Ht 5\' 1"  (1.549 m)   Wt 67.6 kg   SpO2 98%   BMI 28.15 kg/m  Physical Exam Vitals and nursing note reviewed.  Constitutional:      Appearance: She is well-developed.  HENT:     Head: Normocephalic.     Nose: Nose normal.  Eyes:     General: No scleral icterus.    Conjunctiva/sclera: Conjunctivae normal.  Neck:     Thyroid: No thyromegaly.   Cardiovascular:     Rate and Rhythm: Normal rate and regular rhythm.     Heart sounds: No murmur heard.    No friction rub. No gallop.  Pulmonary:     Breath sounds: No stridor. No wheezing or rales.  Chest:     Chest wall: No tenderness.  Abdominal:     General: There is no distension.     Tenderness: There is no abdominal tenderness. There is no rebound.  Genitourinary:    Comments: Rectal exam heme-negative Musculoskeletal:        General: Normal range of motion.     Cervical back: Neck supple.  Lymphadenopathy:     Cervical: No cervical adenopathy.  Skin:    Findings: No erythema or rash.  Neurological:     Mental Status: She is alert and oriented to person, place, and time.     Motor: No abnormal muscle tone.     Coordination: Coordination normal.  Psychiatric:        Behavior: Behavior normal.     ED Results / Procedures / Treatments   Labs (all labs ordered are listed, but only abnormal results are displayed) Labs Reviewed  CBC WITH DIFFERENTIAL/PLATELET - Abnormal; Notable for the following components:      Result Value   RBC 2.37 (*)    Hemoglobin 8.9 (*)    HCT 26.5 (*)    MCV 111.8 (*)    MCH 37.6 (*)    All other components within normal limits  COMPREHENSIVE METABOLIC PANEL - Abnormal; Notable for the following components:   Sodium 134 (*)    Potassium 3.4 (*)    Glucose, Bld 143 (*)    Creatinine, Ser 1.10 (*)    Albumin 3.0 (*)    GFR, Estimated 56 (*)    All other components within normal limits  BRAIN NATRIURETIC PEPTIDE  POC OCCULT BLOOD, ED  TYPE AND SCREEN  TROPONIN I (HIGH SENSITIVITY)  TROPONIN I (HIGH SENSITIVITY)    EKG EKG Interpretation Date/Time:  Monday October 20 2022 20:30:41 EDT Ventricular Rate:  115 PR Interval:  132 QRS Duration:  78 QT Interval:  306 QTC Calculation: 423 R Axis:   91  Text Interpretation: Sinus tachycardia Rightward axis Borderline ECG When compared with ECG of 10-May-2010 08:19, No significant change  was found Confirmed by Bethann Berkshire [phone]) on 10/20/2022 9:34:17 PM  Radiology DG Chest Port 1 View  Result Date: 10/20/2022 CLINICAL DATA:  Shortness of breath and hypotension, initial encounter EXAM: PORTABLE CHEST 1 VIEW COMPARISON:  09/01/22 CT FINDINGS: Cardiac shadow is within normal limits. Aortic calcifications are noted. Right chest wall port is noted in satisfactory position. The lungs are well aerated bilaterally. No focal infiltrate or effusion is seen. The known spiculated lesion in  the right apex is somewhat obscured by overlying rib cage. No new focal abnormality is noted. IMPRESSION: No acute abnormality noted. Known spiculated lesion in the right apex is not well appreciated on this exam due to overlying bony structures. Electronically Signed   By: Alcide Clever M.D.   On: 10/20/2022 21:56    Procedures Procedures    Medications Ordered in ED Medications  albuterol (VENTOLIN HFA) 108 (90 Base) MCG/ACT inhaler 2 puff (has no administration in time range)  sodium chloride 0.9 % bolus 2,000 mL (2,000 mLs Intravenous New Bag/Given 10/20/22 2140)  pantoprazole (PROTONIX) injection 40 mg (40 mg Intravenous Given 10/20/22 2200)    ED Course/ Medical Decision Making/ A&P                             Medical Decision Making Amount and/or Complexity of Data Reviewed Labs: ordered. Radiology: ordered.  Risk Prescription drug management. Decision regarding hospitalization.  This patient presents to the ED for concern of weakness, this involves an extensive number of treatment options, and is a complaint that carries with it a high risk of complications and morbidity.  The differential diagnosis includes worsening lung cancer, dehydration   Co morbidities that complicate the patient evaluation  Lung cancer   Additional history obtained:  Additional history obtained from family External records from outside source obtained and reviewed including hospital records   Lab  Tests:  I Ordered, and personally interpreted labs.  The pertinent results include: Hemoglobin 8.9   Imaging Studies ordered:  I ordered imaging studies including chest x-ray I independently visualized and interpreted imaging which showed known spiculated lesion in the right apex I agree with the radiologist interpretation   Cardiac Monitoring: / EKG:  The patient was maintained on a cardiac monitor.  I personally viewed and interpreted the cardiac monitored which showed an underlying rhythm of: Sinus rhythm   Consultations Obtained:  I requested consultation with the hospitalist,  and discussed lab and imaging findings as well as pertinent plan - they recommend: Admit   Problem List / ED Course / Critical interventions / Medication management  Lung cancer, anemia I ordered medication including normal saline for hypotension Reevaluation of the patient after these medicines showed that the patient improved I have reviewed the patients home medicines and have made adjustments as needed   Social Determinants of Health:  None   Test / Admission - Considered:  None  Patient with anemia and hypotension.  She responded to IV fluids.  She will be admitted and have her hemoglobin followed        Final Clinical Impression(s) / ED Diagnoses Final diagnoses:  Other iron deficiency anemia    Rx / DC Orders ED Discharge Orders     None         Bethann Berkshire, MD 10/22/22 1134

## 2022-10-20 NOTE — ED Triage Notes (Signed)
Pt via POV reporting irregular BP and heart arrhythmia with SOB and dizziness. Husband reports that home BP at 7pm was 74/40 with HR 64. Pt is a stage IV lung cancer pt and received a chemo treatment last week. HR 119 in triage and pt reports continued SOB.

## 2022-10-20 NOTE — Telephone Encounter (Signed)
No energy ,dizzy- SOB more prominent. Yvette Curry has been in bed since Saturday. Husband has to hold her hand when she walks.Oxygen sat at rest was 92% -.98%. Right now BP 97/71, pulse =116.  Drinking fluids., but no appetite.  Constipation-small amt of stool . She is going to take MOM.  Please advise.

## 2022-10-21 ENCOUNTER — Telehealth: Payer: Self-pay | Admitting: Medical Oncology

## 2022-10-21 ENCOUNTER — Other Ambulatory Visit: Payer: Self-pay | Admitting: Physician Assistant

## 2022-10-21 ENCOUNTER — Telehealth: Payer: Self-pay | Admitting: Physician Assistant

## 2022-10-21 ENCOUNTER — Encounter: Payer: Self-pay | Admitting: Internal Medicine

## 2022-10-21 ENCOUNTER — Encounter: Payer: Self-pay | Admitting: Physician Assistant

## 2022-10-21 DIAGNOSIS — I10 Essential (primary) hypertension: Secondary | ICD-10-CM

## 2022-10-21 DIAGNOSIS — C3491 Malignant neoplasm of unspecified part of right bronchus or lung: Secondary | ICD-10-CM

## 2022-10-21 DIAGNOSIS — E861 Hypovolemia: Secondary | ICD-10-CM

## 2022-10-21 DIAGNOSIS — Z720 Tobacco use: Secondary | ICD-10-CM

## 2022-10-21 DIAGNOSIS — E876 Hypokalemia: Secondary | ICD-10-CM | POA: Insufficient documentation

## 2022-10-21 DIAGNOSIS — D649 Anemia, unspecified: Secondary | ICD-10-CM

## 2022-10-21 DIAGNOSIS — R0602 Shortness of breath: Secondary | ICD-10-CM

## 2022-10-21 DIAGNOSIS — I959 Hypotension, unspecified: Secondary | ICD-10-CM | POA: Insufficient documentation

## 2022-10-21 LAB — COMPREHENSIVE METABOLIC PANEL
ALT: 24 U/L (ref 0–44)
AST: 33 U/L (ref 15–41)
Albumin: 2.7 g/dL — ABNORMAL LOW (ref 3.5–5.0)
Alkaline Phosphatase: 39 U/L (ref 38–126)
Anion gap: 6 (ref 5–15)
BUN: 16 mg/dL (ref 8–23)
CO2: 23 mmol/L (ref 22–32)
Calcium: 8.5 mg/dL — ABNORMAL LOW (ref 8.9–10.3)
Chloride: 110 mmol/L (ref 98–111)
Creatinine, Ser: 0.97 mg/dL (ref 0.44–1.00)
GFR, Estimated: >60 mL/min (ref >60)
Glucose, Bld: 125 mg/dL — ABNORMAL HIGH (ref 70–99)
Potassium: 3.6 mmol/L (ref 3.5–5.1)
Sodium: 139 mmol/L (ref 135–145)
Total Bilirubin: 0.5 mg/dL (ref 0.3–1.2)
Total Protein: 5.8 g/dL — ABNORMAL LOW (ref 6.5–8.1)

## 2022-10-21 LAB — IRON AND TIBC
Iron: 72 ug/dL (ref 28–170)
Saturation Ratios: 27 % (ref 10.4–31.8)
TIBC: 266 ug/dL (ref 250–450)
UIBC: 194 ug/dL

## 2022-10-21 LAB — CBC
HCT: 27.2 % — ABNORMAL LOW (ref 36.0–46.0)
Hemoglobin: 9.1 g/dL — ABNORMAL LOW (ref 12.0–15.0)
MCH: 37.8 pg — ABNORMAL HIGH (ref 26.0–34.0)
MCHC: 33.5 g/dL (ref 30.0–36.0)
MCV: 112.9 fL — ABNORMAL HIGH (ref 80.0–100.0)
Platelets: 130 10*3/uL — ABNORMAL LOW (ref 150–400)
RBC: 2.41 MIL/uL — ABNORMAL LOW (ref 3.87–5.11)
RDW: 15.2 % (ref 11.5–15.5)
WBC: 5.3 10*3/uL (ref 4.0–10.5)
nRBC: 0 % (ref 0.0–0.2)

## 2022-10-21 LAB — RETICULOCYTES
Immature Retic Fract: 4.6 % (ref 2.3–15.9)
RBC.: 2.44 MIL/uL — ABNORMAL LOW (ref 3.87–5.11)
Retic Count, Absolute: 9.8 10*3/uL — ABNORMAL LOW (ref 19.0–186.0)
Retic Ct Pct: 0.4 % (ref 0.4–3.1)

## 2022-10-21 LAB — HEMOGLOBIN AND HEMATOCRIT, BLOOD
HCT: 28.6 % — ABNORMAL LOW (ref 36.0–46.0)
Hemoglobin: 9.5 g/dL — ABNORMAL LOW (ref 12.0–15.0)

## 2022-10-21 LAB — HIV ANTIBODY (ROUTINE TESTING W REFLEX): HIV Screen 4th Generation wRfx: NONREACTIVE

## 2022-10-21 LAB — MRSA NEXT GEN BY PCR, NASAL: MRSA by PCR Next Gen: NOT DETECTED

## 2022-10-21 LAB — MAGNESIUM: Magnesium: 1.9 mg/dL (ref 1.7–2.4)

## 2022-10-21 LAB — FOLATE: Folate: 31.3 ng/mL (ref >5.9)

## 2022-10-21 LAB — VITAMIN B12: Vitamin B-12: 587 pg/mL (ref 180–914)

## 2022-10-21 LAB — FERRITIN: Ferritin: 674 ng/mL — ABNORMAL HIGH (ref 11–307)

## 2022-10-21 MED ORDER — ACETAMINOPHEN 325 MG PO TABS: 650.0000 mg | ORAL_TABLET | Freq: Four times a day (QID) | ORAL | Status: DC | PRN

## 2022-10-21 MED ORDER — LACTULOSE 10 GM/15ML PO SOLN
30.0000 g | Freq: Once | ORAL | Status: AC
  Administered 2022-10-21: 30 g via ORAL
  Filled 2022-10-21: qty 60

## 2022-10-21 MED ORDER — FERROUS SULFATE 324 MG PO TBEC: 324.0000 mg | DELAYED_RELEASE_TABLET | Freq: Every day | ORAL | 4 refills | Status: DC

## 2022-10-21 MED ORDER — ORAL CARE MOUTH RINSE: 15.0000 mL | OROMUCOSAL | Status: DC | PRN

## 2022-10-21 MED ORDER — BISACODYL 10 MG RE SUPP
10.0000 mg | Freq: Once | RECTAL | Status: AC
  Administered 2022-10-21: 10 mg via RECTAL
  Filled 2022-10-21: qty 1

## 2022-10-21 MED ORDER — ACETAMINOPHEN 650 MG RE SUPP: 650.0000 mg | Freq: Four times a day (QID) | RECTAL | Status: DC | PRN

## 2022-10-21 MED ORDER — OXYCODONE HCL 5 MG PO TABS: 5.0000 mg | ORAL_TABLET | ORAL | Status: DC | PRN

## 2022-10-21 MED ORDER — POTASSIUM CHLORIDE 20 MEQ PO PACK
20.0000 meq | PACK | Freq: Once | ORAL | Status: AC
  Administered 2022-10-21: 20 meq via ORAL
  Filled 2022-10-21: qty 1

## 2022-10-21 MED ORDER — CHLORHEXIDINE GLUCONATE CLOTH 2 % EX PADS
6.0000 | MEDICATED_PAD | Freq: Every day | CUTANEOUS | Status: DC
  Administered 2022-10-21 (×2): 6 via TOPICAL

## 2022-10-21 MED ORDER — ONDANSETRON HCL 4 MG/2ML IJ SOLN: 4.0000 mg | Freq: Four times a day (QID) | INTRAMUSCULAR | Status: DC | PRN

## 2022-10-21 MED ORDER — FERROUS SULFATE 325 (65 FE) MG PO TABS
325.0000 mg | ORAL_TABLET | Freq: Every day | ORAL | Status: DC
  Administered 2022-10-21: 325 mg via ORAL
  Filled 2022-10-21: qty 1

## 2022-10-21 MED ORDER — ACETAMINOPHEN 325 MG PO TABS: 650.0000 mg | ORAL_TABLET | Freq: Four times a day (QID) | ORAL | 2 refills | Status: DC | PRN

## 2022-10-21 MED ORDER — ONDANSETRON HCL 4 MG PO TABS: 4.0000 mg | ORAL_TABLET | Freq: Four times a day (QID) | ORAL | Status: DC | PRN

## 2022-10-21 MED ORDER — FOLIC ACID 1 MG PO TABS
1.0000 mg | ORAL_TABLET | Freq: Every day | ORAL | Status: DC
  Administered 2022-10-21: 1 mg via ORAL
  Filled 2022-10-21: qty 1

## 2022-10-21 MED ORDER — HEPARIN SOD (PORK) LOCK FLUSH 100 UNIT/ML IV SOLN
500.0000 [IU] | Freq: Once | INTRAVENOUS | Status: AC
  Administered 2022-10-21: 500 [IU] via INTRAVENOUS
  Filled 2022-10-21: qty 5

## 2022-10-21 MED ORDER — PANTOPRAZOLE SODIUM 40 MG PO TBEC
40.0000 mg | DELAYED_RELEASE_TABLET | Freq: Every day | ORAL | Status: DC
  Administered 2022-10-21: 40 mg via ORAL
  Filled 2022-10-21: qty 1

## 2022-10-21 MED ORDER — SODIUM CHLORIDE 0.9 % IV SOLN: INTRAVENOUS | Status: DC

## 2022-10-21 MED ORDER — POLYVINYL ALCOHOL 1.4 % OP SOLN: 1.0000 [drp] | OPHTHALMIC | Status: DC | PRN

## 2022-10-21 MED ORDER — FOLIC ACID 1 MG PO TABS: 1.0000 mg | ORAL_TABLET | Freq: Every day | ORAL | 1 refills | Status: DC

## 2022-10-21 NOTE — Discharge Instructions (Signed)
1)Avoid ibuprofen/Advil/Aleve/Motrin/Goody Powders/Naproxen/BC powders/Meloxicam/Diclofenac/Indomethacin and other Nonsteroidal anti-inflammatory medications as these will make you more likely to bleed and can cause stomach ulcers, can also cause Kidney problems.   2) please follow-up with your physician Dr. Arlis Porta oncology or Dr. Adriana Simas primary care physician for repeat CBC blood test on Friday, 10/24/2022
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
## 2022-10-21 NOTE — Assessment & Plan Note (Signed)
-   Smoking half a pack per day - Currently working on quitting - Declines nicotine patch at this time - Continue to monitor

## 2022-10-21 NOTE — Assessment & Plan Note (Signed)
-   Blood pressure 82/51-128/89 - Holding metoprolol

## 2022-10-21 NOTE — Telephone Encounter (Addendum)
Pt is in [facility] ICU stepdown -Hgb -9.1  Constipation- given rx -lactulose and dulcolax IVF-received 3 liters. To be discharged today.

## 2022-10-21 NOTE — Assessment & Plan Note (Addendum)
-   Ongoing for months, but worse over the past couple days - Thought to be related to anemia - Chest x-ray shows no acute abnormality - EKG shows no ischemic changes - Trope 4, 4 - Patient does have a history of stage IV lung cancer - Continue to monitor

## 2022-10-21 NOTE — Progress Notes (Signed)
Transition of Care [facility]) - Inpatient Brief Assessment   Patient Details  Name: Yvette Curry MRN: [id number] Date of Birth: 1955/12/03  Transition of Care [facility] CM/SW Contact:    Leitha Bleak, RN Phone Number: 10/21/2022, 12:27 PM   Clinical Narrative:  Patient admitted with symptomatic anemia, no needs identified, TOC following.   Transition of Care Asessment: Insurance and Status: (P) Insurance coverage has been reviewed Patient has primary care physician: (P) Yes Home environment has been reviewed: (P) Home with Spouse Prior level of function:: (P) indpendent Prior/Current Home Services: (P) No current home services Social Determinants of Health Reivew: (P) SDOH reviewed no interventions necessary Readmission risk has been reviewed: (P) Yes Transition of care needs: (P) no transition of care needs at this time

## 2022-10-21 NOTE — Telephone Encounter (Signed)
Schedule message sent for appt for port flush before CT scan.

## 2022-10-21 NOTE — Progress Notes (Signed)
Nsg Discharge Note  Admit Date:  10/20/2022 Discharge date: 10/21/2022   BAILYN ROCKOFF to be D/C'd Home per MD order.  AVS completed.  Copy for chart, and copy for patient signed, and dated. Patient/caregiver able to verbalize understanding.  Discharge Medication:   Discharge Assessment: Vitals:   10/21/22 1118 10/21/22 1300  BP:    Pulse:  93  Resp:  (!) 24  Temp: 98 F (36.7 C)   SpO2:  99%   Skin clean, dry and intact without evidence of skin break down, no evidence of skin tears noted. IV catheter discontinued intact. Site without signs and symptoms of complications - no redness or edema noted at insertion site, patient denies c/o pain - only slight tenderness at site.  Dressing with slight pressure applied.  D/c Instructions-Education: Discharge instructions given to patient/family with verbalized understanding. D/c education completed with patient/family including follow up instructions, medication list, d/c activities limitations if indicated, with other d/c instructions as indicated by MD - patient able to verbalize understanding, all questions fully answered. Patient instructed to return to ED, call 911, or call MD for any changes in condition.  Patient escorted via WC, and D/C home via private auto.  Diego Cory, RN 10/21/2022 3:40 PM

## 2022-10-21 NOTE — Assessment & Plan Note (Signed)
-   Potassium down to 3.4 - Replaced with 20 mEq potassium - Trend in the a.m.

## 2022-10-21 NOTE — Assessment & Plan Note (Addendum)
-   Hemoglobin dropped from 11.4>> 8.9 in a week - Patient's baseline seems to be between 11 and 12 - Never had a blood transfusion in the past - Currently on chemo - No bleeding noted - FOBT negative - Symptomatic with hypotension and 82/51, tachycardia up to 122, dyspnea on exertion, generalized weakness - Anemia panel in the a.m. - Trend CBC - Continue home folic acid and iron - Continue to monitor

## 2022-10-21 NOTE — Plan of Care (Signed)

## 2022-10-21 NOTE — Assessment & Plan Note (Signed)
-   Blood pressure down to 82/51 - Holding Lopressor - 2 L bolus in ED improved pressure - Anemia likely contributing - Continue to monitor

## 2022-10-21 NOTE — Telephone Encounter (Signed)
Patient is aware of upcoming appointment times/dates.  
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
## 2022-10-21 NOTE — Assessment & Plan Note (Signed)
-   Currently on chemo - Follows with Dr. Shirline Frees - Started chemo on September, scheduled every 3 weeks, last treatment was June 26 - Continue to monitor

## 2022-10-22 ENCOUNTER — Telehealth: Payer: Self-pay

## 2022-10-22 ENCOUNTER — Telehealth: Payer: Self-pay | Admitting: Internal Medicine

## 2022-10-22 NOTE — Transitions of Care (Post Inpatient/ED Visit) (Signed)
   10/22/2022  Name: JANEMARIE GILLENWATER MRN: [id number] DOB: [birth year]  Today's TOC FU Call Status: Today's TOC FU Call Status:: Unsuccessul Call (1st Attempt) Unsuccessful Call (1st Attempt) Date: 10/22/22  Attempted to reach the patient regarding the most recent Inpatient/ED visit.  Follow Up Plan: Additional outreach attempts will be made to reach the patient to complete the Transitions of Care (Post Inpatient/ED visit) call.   Signature Karena Addison, LPN [facility] Nurse Health Advisor Direct Dial [phone]

## 2022-10-22 NOTE — Telephone Encounter (Signed)
Patient is aware of upcoming appointments times/dates

## 2022-10-24 ENCOUNTER — Ambulatory Visit: Payer: Medicare Other | Admitting: Family Medicine

## 2022-10-24 ENCOUNTER — Encounter: Payer: Self-pay | Admitting: Medical Oncology

## 2022-10-24 ENCOUNTER — Other Ambulatory Visit: Payer: Self-pay

## 2022-10-24 ENCOUNTER — Inpatient Hospital Stay: Payer: Medicare Other | Attending: Internal Medicine

## 2022-10-24 DIAGNOSIS — Z90722 Acquired absence of ovaries, bilateral: Secondary | ICD-10-CM | POA: Insufficient documentation

## 2022-10-24 DIAGNOSIS — T451X5D Adverse effect of antineoplastic and immunosuppressive drugs, subsequent encounter: Secondary | ICD-10-CM | POA: Diagnosis not present

## 2022-10-24 DIAGNOSIS — T451X5A Adverse effect of antineoplastic and immunosuppressive drugs, initial encounter: Secondary | ICD-10-CM

## 2022-10-24 DIAGNOSIS — C3411 Malignant neoplasm of upper lobe, right bronchus or lung: Secondary | ICD-10-CM | POA: Diagnosis present

## 2022-10-24 DIAGNOSIS — C778 Secondary and unspecified malignant neoplasm of lymph nodes of multiple regions: Secondary | ICD-10-CM | POA: Insufficient documentation

## 2022-10-24 DIAGNOSIS — Z5111 Encounter for antineoplastic chemotherapy: Secondary | ICD-10-CM | POA: Insufficient documentation

## 2022-10-24 DIAGNOSIS — Z79899 Other long term (current) drug therapy: Secondary | ICD-10-CM | POA: Insufficient documentation

## 2022-10-24 DIAGNOSIS — D6481 Anemia due to antineoplastic chemotherapy: Secondary | ICD-10-CM | POA: Diagnosis not present

## 2022-10-24 DIAGNOSIS — D649 Anemia, unspecified: Secondary | ICD-10-CM

## 2022-10-24 DIAGNOSIS — Z90710 Acquired absence of both cervix and uterus: Secondary | ICD-10-CM | POA: Insufficient documentation

## 2022-10-24 DIAGNOSIS — Z5112 Encounter for antineoplastic immunotherapy: Secondary | ICD-10-CM | POA: Insufficient documentation

## 2022-10-24 LAB — CBC WITH DIFFERENTIAL (CANCER CENTER ONLY)
Abs Immature Granulocytes: 0.01 10*3/uL (ref 0.00–0.07)
Basophils Absolute: 0 10*3/uL (ref 0.0–0.1)
Basophils Relative: 0 %
Eosinophils Absolute: 0.1 10*3/uL (ref 0.0–0.5)
Eosinophils Relative: 3 %
HCT: 29.1 % — ABNORMAL LOW (ref 36.0–46.0)
Hemoglobin: 10.2 g/dL — ABNORMAL LOW (ref 12.0–15.0)
Immature Granulocytes: 0 %
Lymphocytes Relative: 52 %
Lymphs Abs: 1.3 10*3/uL (ref 0.7–4.0)
MCH: 38.2 pg — ABNORMAL HIGH (ref 26.0–34.0)
MCHC: 35.1 g/dL (ref 30.0–36.0)
MCV: 109 fL — ABNORMAL HIGH (ref 80.0–100.0)
Monocytes Absolute: 0.4 10*3/uL (ref 0.1–1.0)
Monocytes Relative: 17 %
Neutro Abs: 0.7 10*3/uL — ABNORMAL LOW (ref 1.7–7.7)
Neutrophils Relative %: 28 %
Platelet Count: 99 10*3/uL — ABNORMAL LOW (ref 150–400)
RBC: 2.67 MIL/uL — ABNORMAL LOW (ref 3.87–5.11)
RDW: 14.5 % (ref 11.5–15.5)
WBC Count: 2.5 10*3/uL — ABNORMAL LOW (ref 4.0–10.5)
nRBC: 0 % (ref 0.0–0.2)

## 2022-10-24 LAB — SAMPLE TO BLOOD BANK

## 2022-10-24 MED ORDER — SODIUM CHLORIDE 0.9% FLUSH
10.0000 mL | Freq: Once | INTRAVENOUS | Status: AC
  Administered 2022-10-24: 10 mL

## 2022-10-24 NOTE — Progress Notes (Signed)
Neutropenic precautions reviewed with pt and husband.   painful  swelling on top of her right foot > left.   She has been wearing compression hose.   She has not taken  her Metoprolol for 4 days and has not elevated her feet.   I encouraged her to start the metoprolol and elevate her feet, decrease salt content in her diet and to call back Monday with an update.

## 2022-10-27 ENCOUNTER — Ambulatory Visit (HOSPITAL_COMMUNITY)
Admission: RE | Admit: 2022-10-27 | Discharge: 2022-10-27 | Disposition: A | Discharge status: Home / Self Care | Payer: Medicare Other | Source: Ambulatory Visit | Attending: Internal Medicine | Admitting: Internal Medicine

## 2022-10-27 DIAGNOSIS — R0602 Shortness of breath: Secondary | ICD-10-CM | POA: Diagnosis not present

## 2022-10-27 LAB — PULMONARY FUNCTION TEST
DL/VA % pred: 68 %
DL/VA: 2.94 ml/min/mmHg/L
DLCO unc % pred: 56 %
DLCO unc: 9.92 ml/min/mmHg
FEF 25-75 Post: 1.43 L/sec
FEF 25-75 Pre: 1.29 L/sec
FEF2575-%Change-Post: 10 %
FEF2575-%Pred-Post: 72 %
FEF2575-%Pred-Pre: 65 %
FEV1-%Change-Post: 1 %
FEV1-%Pred-Post: 69 %
FEV1-%Pred-Pre: 68 %
FEV1-Post: 1.49 L
FEV1-Pre: 1.47 L
FEV1FVC-%Change-Post: -8 %
FEV1FVC-%Pred-Pre: 103 %
FEV6-%Change-Post: 9 %
FEV6-%Pred-Post: 75 %
FEV6-%Pred-Pre: 68 %
FEV6-Post: 2.03 L
FEV6-Pre: 1.85 L
FEV6FVC-%Pred-Post: 104 %
FEV6FVC-%Pred-Pre: 104 %
FVC-%Change-Post: 10 %
FVC-%Pred-Post: 72 %
FVC-%Pred-Pre: 65 %
FVC-Post: 2.03 L
FVC-Pre: 1.85 L
Post FEV1/FVC ratio: 73 %
Post FEV6/FVC ratio: 100 %
Pre FEV1/FVC ratio: 80 %
Pre FEV6/FVC Ratio: 100 %
RV % pred: 98 %
RV: 1.91 L
TLC % pred: 89 %
TLC: 4.1 L

## 2022-10-27 MED ORDER — ALBUTEROL SULFATE (2.5 MG/3ML) 0.083% IN NEBU
2.5000 mg | INHALATION_SOLUTION | Freq: Once | RESPIRATORY_TRACT | Status: AC
  Administered 2022-10-27: 2.5 mg via RESPIRATORY_TRACT

## 2022-10-27 NOTE — Transitions of Care (Post Inpatient/ED Visit) (Signed)
10/27/2022  Name: Yvette Curry MRN: [id number] DOB: 1955/05/21  Today's TOC FU Call Status: Today's TOC FU Call Status:: Unsuccessul Call (1st Attempt) Unsuccessful Call (1st Attempt) Date: 10/22/22  Transition Care Management Follow-up Telephone Call Date of Discharge: 10/21/22 [name] Type of Discharge: Inpatient Admission Primary Inpatient Discharge Diagnosis:: anemia How have you been since you were released from the hospital?: Better Any questions or concerns?: No  Items Reviewed: Did you receive and understand the discharge instructions provided?: Yes Medications obtained,verified, and reconciled?: Yes (Medications Reviewed) Any new allergies since your discharge?: No Dietary orders reviewed?: Yes Do you have support at home?: Yes People in Home: spouse  Medications Reviewed Today: Medications Reviewed Today     Reviewed by Karena Addison, LPN (Licensed Practical Nurse) on 10/27/22 at 1512  Med List Status: <None>   Medication Order Taking? Sig Documenting Provider Last Dose Status Informant  acetaminophen (TYLENOL) 325 MG tablet [id number]  Take 2 tablets (650 mg total) by mouth every 6 (six) hours as needed for mild pain (or Fever >/= 101). Shon Hale, MD  Active   ascorbic acid (VITAMIN C) 500 MG tablet [id number] No Take by mouth. [provider] Taking Active   Chlorpheniramine Maleate (ALLERGY PO) [id number] No Take 1 tablet by mouth daily as needed (sinus headaches). [provider] Taking Active Self, Pharmacy Records  Cholecalciferol (VITAMIN D-3) 25 MCG (1000 UT) CAPS [id number] No Take 1 capsule by mouth daily. [provider] Taking Active   ELDERBERRY PO [id number] No Take 5 mLs by mouth every other day. [provider] Taking Active Self, Pharmacy Records  ferrous sulfate 324 MG TBEC [id number]  Take 1 tablet (324 mg total) by mouth daily with breakfast. Shon Hale, MD  Active   folic  acid (FOLVITE) 1 MG tablet [id number]  Take 1 tablet (1 mg total) by mouth daily. Shon Hale, MD  Active   metoprolol tartrate (LOPRESSOR) 25 MG tablet [id number] No Take 12.5 mg by mouth daily. [provider] Taking Active   nicotine (NICODERM CQ) 21 mg/24hr patch [id number] No Place 1 patch (21 mg total) onto the skin daily. [name], [place] Taking Active   OVER THE COUNTER MEDICATION [id number] No Take 1 Dose by mouth daily. "EMMA" purchased online-Multivitamin [provider] Taking Active Self, Pharmacy Records  OVER THE COUNTER MEDICATION [id number] No Vitamin D 3, vitamin K, magnesium, zinc, boron [provider] Taking Active   pantoprazole (PROTONIX) 40 MG tablet [id number] No TAKE 1 TABLET BY MOUTH EVERY DAY BEFORE BREAKFAST Carlan, Chelsea L, NP Taking Active   prochlorperazine (COMPAZINE) 10 MG tablet [id number] No Take 10 mg by mouth every 6 (six) hours as needed for nausea or vomiting. [provider] Taking Active   Propylene Glycol (SYSTANE BALANCE) 0.6 % SOLN [id number] No Place 1 drop into both eyes daily as needed (dry eyes). [provider] Taking Active Self, Pharmacy Records  triamcinolone ointment (KENALOG) 0.5 % [id number] No APPLY TO AFFECTED AREA TWICE A DAY [name], DO Taking Active             Home Care and Equipment/Supplies: Were Home Health Services Ordered?: NA Any new equipment or medical supplies ordered?: NA  Functional Questionnaire: Do you need assistance with bathing/showering or dressing?: No Do you need assistance with meal preparation?: No Do you need assistance with eating?: No Do you have difficulty maintaining continence: No Do you need assistance with getting out of bed/getting out of  a chair/moving?: No Do you have difficulty managing or taking your medications?: No  Follow up appointments reviewed: PCP Follow-up appointment confirmed?: No (declined, saw oncologist) MD Provider Line  Number:[phone] Given: No Specialist Hospital Follow-up appointment confirmed?: Yes Date of Specialist follow-up appointment?: 10/24/22 Follow-Up Specialty Provider:: onco Do you need transportation to your follow-up appointment?: No Do you understand care options if your condition(s) worsen?: Yes-patient verbalized understanding    SIGNATURE Karena Addison, LPN [facility] Nurse Health Advisor Direct Dial [phone]
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
## 2022-10-31 ENCOUNTER — Inpatient Hospital Stay: Payer: Medicare Other

## 2022-10-31 ENCOUNTER — Encounter (HOSPITAL_COMMUNITY): Payer: Self-pay

## 2022-10-31 ENCOUNTER — Other Ambulatory Visit: Payer: Self-pay

## 2022-10-31 ENCOUNTER — Ambulatory Visit (HOSPITAL_COMMUNITY)
Admission: RE | Admit: 2022-10-31 | Discharge: 2022-10-31 | Disposition: A | Discharge status: Home / Self Care | Payer: Medicare Other | Source: Ambulatory Visit | Attending: Internal Medicine | Admitting: Internal Medicine

## 2022-10-31 ENCOUNTER — Telehealth: Payer: Self-pay | Admitting: Medical Oncology

## 2022-10-31 DIAGNOSIS — C3490 Malignant neoplasm of unspecified part of unspecified bronchus or lung: Secondary | ICD-10-CM | POA: Insufficient documentation

## 2022-10-31 MED ORDER — IOHEXOL 300 MG/ML  SOLN
100.0000 mL | Freq: Once | INTRAMUSCULAR | Status: AC | PRN
  Administered 2022-10-31: 100 mL via INTRAVENOUS

## 2022-10-31 MED ORDER — HEPARIN SOD (PORK) LOCK FLUSH 100 UNIT/ML IV SOLN
500.0000 [IU] | Freq: Once | INTRAVENOUS | Status: AC
  Administered 2022-10-31: 500 [IU] via INTRAVENOUS

## 2022-10-31 NOTE — Telephone Encounter (Signed)
R forearm  nodule x 1 day. Mildly red, not warm to touch . Denies pain with pressure , itching , insect bite. I instructed her to monitor it apply warm compresses and Arbutus Ped can look at it Monday . I gave her ED precautions.

## 2022-11-03 ENCOUNTER — Encounter (INDEPENDENT_AMBULATORY_CARE_PROVIDER_SITE_OTHER): Payer: Self-pay | Admitting: Ophthalmology

## 2022-11-03 ENCOUNTER — Ambulatory Visit (INDEPENDENT_AMBULATORY_CARE_PROVIDER_SITE_OTHER): Payer: Medicare Other | Admitting: Ophthalmology

## 2022-11-03 DIAGNOSIS — C3491 Malignant neoplasm of unspecified part of right bronchus or lung: Secondary | ICD-10-CM

## 2022-11-03 DIAGNOSIS — Z961 Presence of intraocular lens: Secondary | ICD-10-CM | POA: Diagnosis not present

## 2022-11-03 DIAGNOSIS — H04123 Dry eye syndrome of bilateral lacrimal glands: Secondary | ICD-10-CM

## 2022-11-03 DIAGNOSIS — H35711 Central serous chorioretinopathy, right eye: Secondary | ICD-10-CM

## 2022-11-05 ENCOUNTER — Inpatient Hospital Stay (HOSPITAL_BASED_OUTPATIENT_CLINIC_OR_DEPARTMENT_OTHER): Payer: Medicare Other | Admitting: Internal Medicine

## 2022-11-05 ENCOUNTER — Encounter: Payer: Self-pay | Admitting: *Deleted

## 2022-11-05 ENCOUNTER — Inpatient Hospital Stay: Payer: Medicare Other

## 2022-11-05 ENCOUNTER — Other Ambulatory Visit: Payer: Medicare Other

## 2022-11-05 ENCOUNTER — Other Ambulatory Visit: Payer: Self-pay

## 2022-11-05 VITALS — BP 148/82 | HR 79 | Temp 97.6°F | Resp 17 | Wt 149.8 lb

## 2022-11-05 DIAGNOSIS — C3491 Malignant neoplasm of unspecified part of right bronchus or lung: Secondary | ICD-10-CM | POA: Diagnosis not present

## 2022-11-05 DIAGNOSIS — Z5112 Encounter for antineoplastic immunotherapy: Secondary | ICD-10-CM | POA: Diagnosis not present

## 2022-11-05 DIAGNOSIS — D701 Agranulocytosis secondary to cancer chemotherapy: Secondary | ICD-10-CM

## 2022-11-05 DIAGNOSIS — T451X5A Adverse effect of antineoplastic and immunosuppressive drugs, initial encounter: Secondary | ICD-10-CM

## 2022-11-05 LAB — CBC WITH DIFFERENTIAL (CANCER CENTER ONLY)
Abs Immature Granulocytes: 0.03 10*3/uL (ref 0.00–0.07)
Basophils Absolute: 0.1 10*3/uL (ref 0.0–0.1)
Basophils Relative: 1 %
Eosinophils Absolute: 0.1 10*3/uL (ref 0.0–0.5)
Eosinophils Relative: 2 %
HCT: 34.2 % — ABNORMAL LOW (ref 36.0–46.0)
Hemoglobin: 11.5 g/dL — ABNORMAL LOW (ref 12.0–15.0)
Immature Granulocytes: 1 %
Lymphocytes Relative: 29 %
Lymphs Abs: 1.8 10*3/uL (ref 0.7–4.0)
MCH: 37.6 pg — ABNORMAL HIGH (ref 26.0–34.0)
MCHC: 33.6 g/dL (ref 30.0–36.0)
MCV: 111.8 fL — ABNORMAL HIGH (ref 80.0–100.0)
Monocytes Absolute: 0.8 10*3/uL (ref 0.1–1.0)
Monocytes Relative: 14 %
Neutro Abs: 3.3 10*3/uL (ref 1.7–7.7)
Neutrophils Relative %: 53 %
Platelet Count: 256 10*3/uL (ref 150–400)
RBC: 3.06 MIL/uL — ABNORMAL LOW (ref 3.87–5.11)
RDW: 15.9 % — ABNORMAL HIGH (ref 11.5–15.5)
WBC Count: 6.1 10*3/uL (ref 4.0–10.5)
nRBC: 0 % (ref 0.0–0.2)

## 2022-11-05 LAB — CMP (CANCER CENTER ONLY)
ALT: 18 U/L (ref 0–44)
AST: 25 U/L (ref 15–41)
Albumin: 3.6 g/dL (ref 3.5–5.0)
Alkaline Phosphatase: 53 U/L (ref 38–126)
Anion gap: 4 — ABNORMAL LOW (ref 5–15)
BUN: 11 mg/dL (ref 8–23)
CO2: 28 mmol/L (ref 22–32)
Calcium: 9.8 mg/dL (ref 8.9–10.3)
Chloride: 109 mmol/L (ref 98–111)
Creatinine: 1.03 mg/dL — ABNORMAL HIGH (ref 0.44–1.00)
GFR, Estimated: >60 mL/min (ref >60)
Glucose, Bld: 84 mg/dL (ref 70–99)
Potassium: 4.1 mmol/L (ref 3.5–5.1)
Sodium: 141 mmol/L (ref 135–145)
Total Bilirubin: 0.3 mg/dL (ref 0.3–1.2)
Total Protein: 6.7 g/dL (ref 6.5–8.1)

## 2022-11-05 MED ORDER — SODIUM CHLORIDE 0.9% FLUSH
10.0000 mL | INTRAVENOUS | Status: DC | PRN
  Administered 2022-11-05: 10 mL

## 2022-11-05 MED ORDER — SODIUM CHLORIDE 0.9% FLUSH
10.0000 mL | Freq: Once | INTRAVENOUS | Status: AC
  Administered 2022-11-05: 10 mL

## 2022-11-05 MED ORDER — SODIUM CHLORIDE 0.9 % IV SOLN
200.0000 mg | Freq: Once | INTRAVENOUS | Status: AC
  Administered 2022-11-05: 200 mg via INTRAVENOUS
  Filled 2022-11-05: qty 200

## 2022-11-05 MED ORDER — SODIUM CHLORIDE 0.9 % IV SOLN
400.0000 mg/m2 | Freq: Once | INTRAVENOUS | Status: AC
  Administered 2022-11-05: 700 mg via INTRAVENOUS
  Filled 2022-11-05: qty 20

## 2022-11-05 MED ORDER — SODIUM CHLORIDE 0.9 % IV SOLN: Freq: Once | INTRAVENOUS | Status: AC

## 2022-11-05 MED ORDER — HEPARIN SOD (PORK) LOCK FLUSH 100 UNIT/ML IV SOLN
500.0000 [IU] | Freq: Once | INTRAVENOUS | Status: AC | PRN
  Administered 2022-11-05: 500 [IU]

## 2022-11-05 NOTE — Progress Notes (Signed)
At the time of d/c Pt informed this RN that she has noticed worsened SOB, lightheadedness, and bilateral ankle swelling after infusions. Pt stated "I have seen all of my other doctors and they say it is related to the chemotherapy". This RN assessed Pt and found +1 pitting edema at ankles. Upon auscultation, lungs were found to be clear. This RN educated Pt to monitor and keep a log of s/s at home. This RN also educated Pt to report to ED if SOB and/or trouble breathing progresses. Pt verbalized understanding and expressed gratitude for information provided. Pt was agreeable with information and was discharged via ambulation to lobby. This RN made MD aware.
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
## 2022-11-05 NOTE — Progress Notes (Signed)
Patient seen by Dr. Mohamed  Vitals are within treatment parameters.  Labs reviewed: and are within treatment parameters.  Per physician team, patient is ready for treatment and there are NO modifications to the treatment plan.  

## 2022-11-05 NOTE — Patient Instructions (Signed)
[facility]  Discharge Instructions: Thank you for choosing [facility] to provide your oncology and hematology care.   If you have a lab appointment with the Cancer Center, please go directly to the Cancer Center and check in at the registration area.   Wear comfortable clothing and clothing appropriate for easy access to any Portacath or PICC line.   We strive to give you quality time with your provider. You may need to reschedule your appointment if you arrive late (15 or more minutes).  Arriving late affects you and other patients whose appointments are after yours.  Also, if you miss three or more appointments without notifying the office, you may be dismissed from the clinic at the provider's discretion.      For prescription refill requests, have your pharmacy contact our office and allow 72 hours for refills to be completed.    Today you received the following chemotherapy and/or immunotherapy agents: Keytruda/Alimta      To help prevent nausea and vomiting after your treatment, we encourage you to take your nausea medication as directed.  BELOW ARE SYMPTOMS THAT SHOULD BE REPORTED IMMEDIATELY: *FEVER GREATER THAN 100.4 F (38 C) OR HIGHER *CHILLS OR SWEATING *NAUSEA AND VOMITING THAT IS NOT CONTROLLED WITH YOUR NAUSEA MEDICATION *UNUSUAL SHORTNESS OF BREATH *UNUSUAL BRUISING OR BLEEDING *URINARY PROBLEMS (pain or burning when urinating, or frequent urination) *BOWEL PROBLEMS (unusual diarrhea, constipation, pain near the anus) TENDERNESS IN MOUTH AND THROAT WITH OR WITHOUT PRESENCE OF ULCERS (sore throat, sores in mouth, or a toothache) UNUSUAL RASH, SWELLING OR PAIN  UNUSUAL VAGINAL DISCHARGE OR ITCHING   Items with * indicate a potential emergency and should be followed up as soon as possible or go to the Emergency Department if any problems should occur.  Please show the CHEMOTHERAPY ALERT CARD or IMMUNOTHERAPY ALERT CARD at  check-in to the Emergency Department and triage nurse.  Should you have questions after your visit or need to cancel or reschedule your appointment, please contact [facility]  Dept: [phone]  and follow the prompts.  Office hours are 8:00 a.m. to 4:30 p.m. Monday - Friday. Please note that voicemails left after 4:00 p.m. may not be returned until the following business day.  We are closed weekends and major holidays. You have access to a nurse at all times for urgent questions. Please call the main number to the clinic Dept: [phone] and follow the prompts.   For any non-urgent questions, you may also contact your provider using MyChart. We now offer e-Visits for anyone 18 and older to request care online for non-urgent symptoms. For details visit [email or web address].   Also download the MyChart app! Go to the app store, search "MyChart", open the app, select , and log in with your MyChart username and password.   

## 2022-11-13 NOTE — Progress Notes (Signed)
Thoracic Location of Tumor / Histology: Right Upper Lobe Lung  Patient presented for repeat scans for right upper lobe lung cancer.  CT CAP 10/31/2022: Unchanged left axillary lymph nodes measuring up to 1.1 x 1.1 cm. No other enlarged mediastinal, hilar, or axillary lymph nodes.   Suspect slight interval enlargement of a spiculated nodule of the right pulmonary apex measuring 1.4 x 1.0 cm, previously 1.1 x 1.0 cm.    CT Chest 09/01/2022: Mild centrilobular emphysema. Unchanged spiculated nodule of the right pulmonary apex measuring 1.1 x 1.0 cm.   Biopsies of    Tobacco/Marijuana/Snuff/ETOH use: Smoker  Past/Anticipated interventions by cardiothoracic surgery, if any:    Past/Anticipated interventions by medical oncology, if any:  Dr. Arbutus Ped 11/05/2022 -She had repeat CT scan of the chest, abdomen and pelvis performed recently.  Her scan showed stable disease except for slightly enlarging right lung nodule.  -I recommended for the patient to continue her current maintenance treatment with Alimta and Keytruda but I will refer her to radiation oncology for consideration of SBRT to the enlarging right lung nodule suspicious for disease progression.    Signs/Symptoms Weight changes, if any:  Respiratory complaints, if any: She reports SOB with increased activity.  She feels winded going up an incline. Hemoptysis, if any: She reports occasional cough. Pain issues, if any:  She reports some swelling, discoloration, and pain to her feet.  Left foot is swollen and has some heat to it.  She is wearing compression socks.  Skin is dry to this area, using udderly cream.  All of this is in relation to her chemo treatments.  SAFETY ISSUES: Prior radiation? No Pacemaker/ICD?  No Possible current pregnancy? Hysterectomy Is the patient on methotrexate? No  Current Complaints / other details:   Chest [facility]

## 2022-11-14 ENCOUNTER — Ambulatory Visit
Admission: RE | Admit: 2022-11-14 | Discharge: 2022-11-14 | Disposition: A | Discharge status: Home / Self Care | Payer: Medicare Other | Source: Ambulatory Visit | Attending: Radiation Oncology | Admitting: Radiation Oncology

## 2022-11-14 ENCOUNTER — Encounter: Payer: Self-pay | Admitting: Radiation Oncology

## 2022-11-14 ENCOUNTER — Other Ambulatory Visit: Payer: Self-pay

## 2022-11-14 VITALS — BP 116/84 | HR 101 | Temp 97.6°F | Resp 18 | Ht 61.0 in | Wt 145.2 lb

## 2022-11-14 DIAGNOSIS — J90 Pleural effusion, not elsewhere classified: Secondary | ICD-10-CM | POA: Diagnosis not present

## 2022-11-14 DIAGNOSIS — I3139 Other pericardial effusion (noninflammatory): Secondary | ICD-10-CM | POA: Insufficient documentation

## 2022-11-14 DIAGNOSIS — M1990 Unspecified osteoarthritis, unspecified site: Secondary | ICD-10-CM | POA: Insufficient documentation

## 2022-11-14 DIAGNOSIS — C3411 Malignant neoplasm of upper lobe, right bronchus or lung: Secondary | ICD-10-CM

## 2022-11-14 DIAGNOSIS — E7800 Pure hypercholesterolemia, unspecified: Secondary | ICD-10-CM | POA: Insufficient documentation

## 2022-11-14 DIAGNOSIS — Z9221 Personal history of antineoplastic chemotherapy: Secondary | ICD-10-CM | POA: Diagnosis not present

## 2022-11-14 DIAGNOSIS — J439 Emphysema, unspecified: Secondary | ICD-10-CM | POA: Insufficient documentation

## 2022-11-14 DIAGNOSIS — H3581 Retinal edema: Secondary | ICD-10-CM | POA: Insufficient documentation

## 2022-11-14 DIAGNOSIS — R0602 Shortness of breath: Secondary | ICD-10-CM | POA: Insufficient documentation

## 2022-11-14 DIAGNOSIS — Z8052 Family history of malignant neoplasm of bladder: Secondary | ICD-10-CM | POA: Diagnosis not present

## 2022-11-14 DIAGNOSIS — K760 Fatty (change of) liver, not elsewhere classified: Secondary | ICD-10-CM | POA: Insufficient documentation

## 2022-11-14 DIAGNOSIS — Z79899 Other long term (current) drug therapy: Secondary | ICD-10-CM | POA: Diagnosis not present

## 2022-11-14 DIAGNOSIS — H3589 Other specified retinal disorders: Secondary | ICD-10-CM | POA: Insufficient documentation

## 2022-11-14 DIAGNOSIS — Z87891 Personal history of nicotine dependence: Secondary | ICD-10-CM | POA: Diagnosis not present

## 2022-11-14 DIAGNOSIS — J432 Centrilobular emphysema: Secondary | ICD-10-CM | POA: Insufficient documentation

## 2022-11-14 DIAGNOSIS — I700 Atherosclerosis of aorta: Secondary | ICD-10-CM | POA: Insufficient documentation

## 2022-11-14 DIAGNOSIS — K219 Gastro-esophageal reflux disease without esophagitis: Secondary | ICD-10-CM | POA: Diagnosis not present
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
## 2022-11-17 ENCOUNTER — Telehealth: Payer: Self-pay | Admitting: Internal Medicine

## 2022-11-17 DIAGNOSIS — R600 Localized edema: Secondary | ICD-10-CM

## 2022-11-17 DIAGNOSIS — M79605 Pain in left leg: Secondary | ICD-10-CM

## 2022-11-17 NOTE — Telephone Encounter (Signed)
Patient walked into the office stating that her left foot continues to swell. She is on chemo. She meet her oncology radiation doctor last week. He is aware of the swelling.  States that the pain continues all day and night. Had recent Vascular studies. She does have episodes of shortness of breath. States that she never received her monitor results.

## 2022-11-17 NOTE — Telephone Encounter (Signed)
Patient walked into office to report swelling in both legs and feet for several months. Reports left leg swelling is worse with redness and tenderness in left foot, rated 4/10. Reports taking tylenol for foot pain and symptoms improved. Currently wears thigh high compression stockings. Compression stockings removed so legs and feet could be examined by nurse. Non pitting edema noted in left leg and foot, slight redness and tenderness noted to left foot when touched. Pulses palpable in both feet and regular. Both legs and feet warm to touch. No drainage. No redness or streaks noted to either leg. Reports SOB and dizziness that she's had for awhile and is unchanged. Reports SOB is usually with exertion. Denies chest pain. Medications reviewed and gave first available appointment to see [facility] 12/01/2022. Advised if symptoms get worse, to go to ED for an evaluation. Verbalized understanding of plan.

## 2022-11-19 ENCOUNTER — Telehealth: Payer: Self-pay | Admitting: Family Medicine

## 2022-11-19 MED ORDER — METOPROLOL SUCCINATE ER 25 MG PO TB24: 12.5000 mg | ORAL_TABLET | Freq: Every day | ORAL | Status: DC

## 2022-11-19 NOTE — Telephone Encounter (Signed)
Patient having issues with swelling in her feet/ ankles still went to specialist and they suggested a special x-ray she can do. She is wanting you to review the notes from specialist and give your feed back on these x-rays because is is still swelling. And they want her to follow up with you on her COPD also. Please advise

## 2022-11-20 NOTE — Telephone Encounter (Signed)
Yvette Sams, DO    I don't see anything in the chart about "special xrays".  Defer to cardiology given HFrEF.

## 2022-11-21 ENCOUNTER — Other Ambulatory Visit (HOSPITAL_COMMUNITY): Admission: RE | Admit: 2022-11-21 | Payer: Medicare Other | Source: Ambulatory Visit

## 2022-11-21 DIAGNOSIS — M79605 Pain in left leg: Secondary | ICD-10-CM | POA: Diagnosis present

## 2022-11-21 DIAGNOSIS — R600 Localized edema: Secondary | ICD-10-CM | POA: Diagnosis present

## 2022-11-21 LAB — URIC ACID: Uric Acid, Serum: 5.3 mg/dL (ref 2.5–7.1)

## 2022-11-21 LAB — C-REACTIVE PROTEIN: CRP: 0.9 mg/dL

## 2022-11-21 LAB — D-DIMER, QUANTITATIVE: D-Dimer, Quant: 1.3 ug/mL-FEU — ABNORMAL HIGH (ref 0.00–0.50)

## 2022-11-21 LAB — SEDIMENTATION RATE: Sed Rate: 89 mm/hr — ABNORMAL HIGH (ref 0–22)

## 2022-11-21 NOTE — Telephone Encounter (Signed)
Per Merlyn Albert has active cancer, it is better to r/o DVT. Get D-dimer then, if elevated, will obtain USG venous doppler of LLE. For L foot swelling, it is not cardiac related. It could be gout too or something else, obtain uric acid, ESR and CRP.

## 2022-11-21 NOTE — Addendum Note (Signed)
Addended by: Eustace Moore on: 11/21/2022 01:21 PM   Modules accepted: Orders

## 2022-11-21 NOTE — Telephone Encounter (Addendum)
Patient informed and verbalized understanding of plan. Says she doesn't have pain in left foot now. Reports she has swelling in both feet now. Says she can't go today but will go on Monday.

## 2022-11-25 ENCOUNTER — Telehealth: Payer: Self-pay | Admitting: Internal Medicine

## 2022-11-25 NOTE — Telephone Encounter (Signed)
Autumn is calling on the behalf of the patient. Autumn stated the patient called their office concerned about the lab results. Autumn is requesting we call the patient and speak with them about the results.

## 2022-11-25 NOTE — Telephone Encounter (Signed)
Patient had blood work through Cardiology and is awaiting results and next steps- Spoke with Cardiology office and they stated they would be reaching out to patient.

## 2022-11-26 ENCOUNTER — Other Ambulatory Visit: Payer: Self-pay

## 2022-11-26 ENCOUNTER — Inpatient Hospital Stay (HOSPITAL_BASED_OUTPATIENT_CLINIC_OR_DEPARTMENT_OTHER): Payer: Medicare Other | Admitting: Internal Medicine

## 2022-11-26 ENCOUNTER — Inpatient Hospital Stay: Payer: Medicare Other | Attending: Internal Medicine

## 2022-11-26 ENCOUNTER — Other Ambulatory Visit: Payer: Medicare Other

## 2022-11-26 ENCOUNTER — Ambulatory Visit: Admission: RE | Admit: 2022-11-26 | Payer: Medicare Other | Source: Ambulatory Visit | Admitting: Radiation Oncology

## 2022-11-26 ENCOUNTER — Inpatient Hospital Stay: Payer: Medicare Other

## 2022-11-26 DIAGNOSIS — D649 Anemia, unspecified: Secondary | ICD-10-CM | POA: Insufficient documentation

## 2022-11-26 DIAGNOSIS — C3491 Malignant neoplasm of unspecified part of right bronchus or lung: Secondary | ICD-10-CM

## 2022-11-26 DIAGNOSIS — C781 Secondary malignant neoplasm of mediastinum: Secondary | ICD-10-CM | POA: Diagnosis not present

## 2022-11-26 DIAGNOSIS — Z510 Encounter for antineoplastic radiation therapy: Secondary | ICD-10-CM | POA: Insufficient documentation

## 2022-11-26 DIAGNOSIS — Z5111 Encounter for antineoplastic chemotherapy: Secondary | ICD-10-CM | POA: Insufficient documentation

## 2022-11-26 DIAGNOSIS — C3411 Malignant neoplasm of upper lobe, right bronchus or lung: Secondary | ICD-10-CM | POA: Insufficient documentation

## 2022-11-26 DIAGNOSIS — C7989 Secondary malignant neoplasm of other specified sites: Secondary | ICD-10-CM | POA: Diagnosis not present

## 2022-11-26 DIAGNOSIS — Z90710 Acquired absence of both cervix and uterus: Secondary | ICD-10-CM | POA: Insufficient documentation

## 2022-11-26 DIAGNOSIS — Z79899 Other long term (current) drug therapy: Secondary | ICD-10-CM | POA: Diagnosis not present

## 2022-11-26 DIAGNOSIS — Z5112 Encounter for antineoplastic immunotherapy: Secondary | ICD-10-CM | POA: Diagnosis present

## 2022-11-26 DIAGNOSIS — D701 Agranulocytosis secondary to cancer chemotherapy: Secondary | ICD-10-CM

## 2022-11-26 LAB — CBC WITH DIFFERENTIAL (CANCER CENTER ONLY)
Abs Immature Granulocytes: 0.02 10*3/uL (ref 0.00–0.07)
Basophils Absolute: 0.1 10*3/uL (ref 0.0–0.1)
Basophils Relative: 1 %
Eosinophils Absolute: 0.2 10*3/uL (ref 0.0–0.5)
Eosinophils Relative: 3 %
HCT: 31.8 % — ABNORMAL LOW (ref 36.0–46.0)
Hemoglobin: 10.9 g/dL — ABNORMAL LOW (ref 12.0–15.0)
Immature Granulocytes: 0 %
Lymphocytes Relative: 24 %
Lymphs Abs: 1.9 10*3/uL (ref 0.7–4.0)
MCH: 37.7 pg — ABNORMAL HIGH (ref 26.0–34.0)
MCHC: 34.3 g/dL (ref 30.0–36.0)
MCV: 110 fL — ABNORMAL HIGH (ref 80.0–100.0)
Monocytes Absolute: 1 10*3/uL (ref 0.1–1.0)
Monocytes Relative: 14 %
Neutro Abs: 4.5 10*3/uL (ref 1.7–7.7)
Neutrophils Relative %: 58 %
Platelet Count: 239 10*3/uL (ref 150–400)
RBC: 2.89 MIL/uL — ABNORMAL LOW (ref 3.87–5.11)
RDW: 14.7 % (ref 11.5–15.5)
WBC Count: 7.7 10*3/uL (ref 4.0–10.5)
nRBC: 0 % (ref 0.0–0.2)

## 2022-11-26 LAB — CMP (CANCER CENTER ONLY)
ALT: 13 U/L (ref 0–44)
AST: 20 U/L (ref 15–41)
Albumin: 3.5 g/dL (ref 3.5–5.0)
Alkaline Phosphatase: 55 U/L (ref 38–126)
Anion gap: 5 (ref 5–15)
BUN: 8 mg/dL (ref 8–23)
CO2: 30 mmol/L (ref 22–32)
Calcium: 9.4 mg/dL (ref 8.9–10.3)
Chloride: 107 mmol/L (ref 98–111)
Creatinine: 1 mg/dL (ref 0.44–1.00)
GFR, Estimated: >60 mL/min (ref >60)
Glucose, Bld: 107 mg/dL — ABNORMAL HIGH (ref 70–99)
Potassium: 3.9 mmol/L (ref 3.5–5.1)
Sodium: 142 mmol/L (ref 135–145)
Total Bilirubin: 0.4 mg/dL (ref 0.3–1.2)
Total Protein: 6.7 g/dL (ref 6.5–8.1)

## 2022-11-26 MED ORDER — SODIUM CHLORIDE 0.9 % IV SOLN: Freq: Once | INTRAVENOUS | Status: AC

## 2022-11-26 MED ORDER — SODIUM CHLORIDE 0.9% FLUSH
10.0000 mL | INTRAVENOUS | Status: DC | PRN
  Administered 2022-11-26: 10 mL

## 2022-11-26 MED ORDER — SODIUM CHLORIDE 0.9 % IV SOLN
400.0000 mg/m2 | Freq: Once | INTRAVENOUS | Status: AC
  Administered 2022-11-26: 700 mg via INTRAVENOUS
  Filled 2022-11-26: qty 20

## 2022-11-26 MED ORDER — HEPARIN SOD (PORK) LOCK FLUSH 100 UNIT/ML IV SOLN
500.0000 [IU] | Freq: Once | INTRAVENOUS | Status: AC | PRN
  Administered 2022-11-26: 500 [IU]

## 2022-11-26 MED ORDER — SODIUM CHLORIDE 0.9% FLUSH
10.0000 mL | Freq: Once | INTRAVENOUS | Status: AC
  Administered 2022-11-26: 10 mL

## 2022-11-26 MED ORDER — SODIUM CHLORIDE 0.9 % IV SOLN
200.0000 mg | Freq: Once | INTRAVENOUS | Status: AC
  Administered 2022-11-26: 200 mg via INTRAVENOUS
  Filled 2022-11-26: qty 200
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
## 2022-11-26 NOTE — Patient Instructions (Signed)
[facility]  Discharge Instructions: Thank you for choosing [facility] to provide your oncology and hematology care.   If you have a lab appointment with the Cancer Center, please go directly to the Cancer Center and check in at the registration area.   Wear comfortable clothing and clothing appropriate for easy access to any Portacath or PICC line.   We strive to give you quality time with your provider. You may need to reschedule your appointment if you arrive late (15 or more minutes).  Arriving late affects you and other patients whose appointments are after yours.  Also, if you miss three or more appointments without notifying the office, you may be dismissed from the clinic at the provider's discretion.      For prescription refill requests, have your pharmacy contact our office and allow 72 hours for refills to be completed.    Today you received the following chemotherapy and/or immunotherapy agents: Keytruda/Alimta      To help prevent nausea and vomiting after your treatment, we encourage you to take your nausea medication as directed.  BELOW ARE SYMPTOMS THAT SHOULD BE REPORTED IMMEDIATELY: *FEVER GREATER THAN 100.4 F (38 C) OR HIGHER *CHILLS OR SWEATING *NAUSEA AND VOMITING THAT IS NOT CONTROLLED WITH YOUR NAUSEA MEDICATION *UNUSUAL SHORTNESS OF BREATH *UNUSUAL BRUISING OR BLEEDING *URINARY PROBLEMS (pain or burning when urinating, or frequent urination) *BOWEL PROBLEMS (unusual diarrhea, constipation, pain near the anus) TENDERNESS IN MOUTH AND THROAT WITH OR WITHOUT PRESENCE OF ULCERS (sore throat, sores in mouth, or a toothache) UNUSUAL RASH, SWELLING OR PAIN  UNUSUAL VAGINAL DISCHARGE OR ITCHING   Items with * indicate a potential emergency and should be followed up as soon as possible or go to the Emergency Department if any problems should occur.  Please show the CHEMOTHERAPY ALERT CARD or IMMUNOTHERAPY ALERT CARD at  check-in to the Emergency Department and triage nurse.  Should you have questions after your visit or need to cancel or reschedule your appointment, please contact [facility]  Dept: [phone]  and follow the prompts.  Office hours are 8:00 a.m. to 4:30 p.m. Monday - Friday. Please note that voicemails left after 4:00 p.m. may not be returned until the following business day.  We are closed weekends and major holidays. You have access to a nurse at all times for urgent questions. Please call the main number to the clinic Dept: [phone] and follow the prompts.   For any non-urgent questions, you may also contact your provider using MyChart. We now offer e-Visits for anyone 18 and older to request care online for non-urgent symptoms. For details visit [email or web address].   Also download the MyChart app! Go to the app store, search "MyChart", open the app, select , and log in with your MyChart username and password.   

## 2022-11-28 ENCOUNTER — Telehealth: Payer: Self-pay | Admitting: Internal Medicine

## 2022-11-28 NOTE — Telephone Encounter (Signed)
 Called patient regarding August/September appointments, patient is notified.

## 2022-12-01 ENCOUNTER — Telehealth: Payer: Self-pay | Admitting: Nurse Practitioner

## 2022-12-01 ENCOUNTER — Ambulatory Visit: Payer: Medicare Other | Attending: Nurse Practitioner | Admitting: Nurse Practitioner

## 2022-12-01 ENCOUNTER — Encounter: Payer: Self-pay | Admitting: Nurse Practitioner

## 2022-12-01 VITALS — BP 90/58 | HR 113 | Ht 61.0 in | Wt 143.4 lb

## 2022-12-01 DIAGNOSIS — C3491 Malignant neoplasm of unspecified part of right bronchus or lung: Secondary | ICD-10-CM

## 2022-12-01 DIAGNOSIS — R42 Dizziness and giddiness: Secondary | ICD-10-CM | POA: Diagnosis not present

## 2022-12-01 DIAGNOSIS — R000 Tachycardia, unspecified: Secondary | ICD-10-CM

## 2022-12-01 DIAGNOSIS — I10 Essential (primary) hypertension: Secondary | ICD-10-CM | POA: Diagnosis not present

## 2022-12-01 DIAGNOSIS — I959 Hypotension, unspecified: Secondary | ICD-10-CM | POA: Diagnosis not present

## 2022-12-01 DIAGNOSIS — I3139 Other pericardial effusion (noninflammatory): Secondary | ICD-10-CM

## 2022-12-01 DIAGNOSIS — R600 Localized edema: Secondary | ICD-10-CM

## 2022-12-01 DIAGNOSIS — R0609 Other forms of dyspnea: Secondary | ICD-10-CM

## 2022-12-01 DIAGNOSIS — I5022 Chronic systolic (congestive) heart failure: Secondary | ICD-10-CM | POA: Diagnosis not present

## 2022-12-01 DIAGNOSIS — R5383 Other fatigue: Secondary | ICD-10-CM

## 2022-12-01 DIAGNOSIS — R899 Unspecified abnormal finding in specimens from other organs, systems and tissues: Secondary | ICD-10-CM

## 2022-12-01 MED ORDER — OMRON 3 SERIES BP MONITOR DEVI: 1 refills | Status: DC

## 2022-12-01 MED ORDER — METOPROLOL SUCCINATE ER 25 MG PO TB24: 12.5000 mg | ORAL_TABLET | Freq: Every day | ORAL | Status: DC

## 2022-12-01 NOTE — Telephone Encounter (Signed)
Called and s/w patient at 1645 today regarding her request. Admits to chronic, stable DOE. Stated she "told this to the nurse but I should have brought this up to your attention." Denies any worsening symptoms related to her DOE. Discussed/reviewed tests and treatment plan/options. Recommended/offered pulmonology referral for further evaluation, pt is agreeable with referral and requests referral to be placed in [place]. Will addendum note and place referral to Pulmonology to reflect these changes.   She verbalized understanding of our conversation and was appreciative of my call.   Sharlene Dory, NP

## 2022-12-01 NOTE — Telephone Encounter (Signed)
  Pt is calling and requesting to speak with Lanora Manis, her nurse or the check out person in [place]. She said, she need to discuss about correction in her AVS

## 2022-12-01 NOTE — Addendum Note (Signed)
Addended by: Sharlene Dory on: 12/01/2022 07:23 PM   Modules accepted: Orders

## 2022-12-01 NOTE — Telephone Encounter (Signed)
Patient calling back with concerns in her documentation in her mychart - office visit notes from today.  States she DID have shortness of breath and is documented that she denied SOB.

## 2022-12-01 NOTE — Progress Notes (Addendum)
Cardiology Office Note:  .   Date:  12/01/2022  ID:  Yvette Curry, DOB 01-Jul-1955, MRN [id number] PCP: [name], DO  [facility] Providers Cardiologist:  Marjo Bicker, MD    History of Present Illness: .   Yvette Curry is a 67 y.o. female with a PMH of HFmrEF, HTN, dizziness, swelling, and stage 4 lung cancer on chemotherapy, who presents today for swelling evaluation.   Diagnosed with stage IV lung cancer with metastasis in August 2023.  It was noted she was having chronic DOE, was referred to cardiology for evaluation.  Echocardiogram revealed mildly reduced EF at 45 to 50%, started on Lopressor 12.5 mg daily.  GDMT limited due to soft blood pressures.  Last seen by Dr. Jenene Slicker on October 09, 2022.  At this office visit, she noted dizziness with standing and sometimes with walking.  Orthostatics were negative for orthostatic hypotension/POTS.  Evaluated by vascular surgery for leg swelling likely due to chronic venous insufficiency and deemed to be secondary due to chemotherapy. 1 week monitor benign.  PFTs revealed minimal COPD and moderate diffusion defect which explained to her DOE.  Today she presents for swelling evaluation with her husband. She presents with multiple chief concerns. Reports feeling fatigued, patient and husband state BP has been labile, and husband reports SBP dropping to 70 at one time, had to receive IV fluids at [facility], also admits to slightly fast heart rate, does have reported hx of [facility]. Has not been checking/logging BP consistently. Does admit to some dizziness at times. Pt states she has had chronic leg edema, was on several diuretics that did not make a difference according to her report. She has been wearing compression stockings and elevating her legs, but states this has not helped. Denies any chest pain, palpitations, syncope, presyncope, orthopnea, PND, significant weight changes, acute bleeding, or claudication. She also has some  concerns/questions about her recent lab work results. Admits to chronic, stable DOE and denies any worsening symptoms related to her breathing.  FH: CHF, biological mother had LVAD and ICD. Biological sister had SSS, s/p PPM in her 40s LVEF 45%.   Studies Reviewed: .    Cardiac monitor 11/2022:    Patch wear time was 8 days.   Normal sinus rhythm predominantly ranging from 62 to 137 bpm with an average HR 91 bpm.   No atrial or ventricular arrhythmias.   No AV block or pauses.   <1% PAC burden and <1% PVC burden.   Patient triggered events correlated with NSR (80-130 BPM) and ectopy.   Vascular ultrasound lower right extremity 06/2022:  Summary:  Right:  - No evidence of deep vein thrombosis seen in the right lower extremity,  from the common femoral through the popliteal veins.  - No evidence of superficial venous thrombosis in the right lower  extremity.    - The deep venous system is incompetent.  - The great saphenous vein is incompetent.  - The small saphenous vein is incompetent.   *See table(s) above for measurements and observations.  Echo 06/2022:  1. Left ventricular ejection fraction, by estimation, is 45 to 50%. Left  ventricular ejection fraction by 3D volume is 45 %. The left ventricle has  mildly decreased function. The left ventricle demonstrates global  hypokinesis. Left ventricular diastolic   parameters are consistent with Grade I diastolic dysfunction (impaired  relaxation).   2. Right ventricular systolic function is normal. The right ventricular  size is normal. There is  normal pulmonary artery systolic pressure. The  estimated right ventricular systolic pressure is 16.0 mmHg.   3. The mitral valve is normal in structure. No evidence of mitral valve  regurgitation. No evidence of mitral stenosis.   4. The aortic valve is tricuspid. Aortic valve regurgitation is mild. No  aortic stenosis is present.   5. The inferior vena cava is normal in size with greater  than 50%  respiratory variability, suggesting right atrial pressure of 3 mmHg.  Physical Exam:   VS:  BP (!) 90/58   Pulse (!) 113   Ht 5\' 1"  (1.549 m)   Wt 143 lb 6.4 oz (65 kg)   SpO2 96%   BMI 27.10 kg/m    Wt Readings from Last 3 Encounters:  12/01/22 143 lb 6.4 oz (65 kg)  11/26/22 145 lb 1.6 oz (65.8 kg)  11/14/22 145 lb 3.2 oz (65.9 kg)    Repeat BP: 90/60  GEN: Well nourished, well developed in no acute distress NECK: No JVD; No carotid bruits CARDIAC: S1/S2, regular rhythm and slightly fast rate, no murmurs, rubs, gallops RESPIRATORY:  Clear to auscultation without rales, wheezing or rhonchi  ABDOMEN: Soft, non-tender, non-distended EXTREMITIES:  UTA edema to BLE as compression stockings are in place; No deformity   ASSESSMENT AND PLAN: .    HFmrEF Stage C, NYHA class I symptoms. EF 45-50%. Euvolemic and well compensated on exam. Weights are stable. GDMT limited d/t hx of soft and labile blood pressures. Husband reports SBP dropping to 70s, admits to fatigue. Will hold Metoprolol succinate x 2 weeks and she will monitor her HR and BP and let us know her readings via MyChart. Will send in Rx for BP cuff. GDMT limited at this time with her BP, hesitant to start SGLT2 inhibitor due to the fact that she has poor appetite currently. Low sodium diet, fluid restriction <2L, and daily weights encouraged. Educated to contact our office for weight gain of 2 lbs overnight or 5 lbs in one week.  Hypotension, Hx of HTN, dizziness, fatigue, tachycardia Etiology multifactorial.  Does admit to some labile blood pressure readings, husband reports SBP dropping to 70s, patient is symptomatic with this and endorses fatigue/dizziness at times.  Past orthostatics were negative.  HR slightly elevated today, does admit to [facility]. Will hold metoprolol succinate x 2 weeks d/t drops in BP, she will let us know her BP/HR readings at the end of those 2 weeks via MyChart.  Depending on those readings, may  want to consider midodrine trial if she considers to have drops in BP and/or parameters for metoprolol. Discussed to monitor BP at home at least 2 hours after medications and sitting for 5-10 minutes. Given BP log and Rx for BP cuff.   Leg edema This is secondary due to chronic venous insufficiency, no evidence of concerning signs or symptoms of DVT as she shows me recent photos on her phone.  No evidence of DVT on Doppler from January 2024.  Vascular ultrasound study in March 2024 showed incompetent right lower extremity veins with no evidence of DVT along the right lower extremity.  Hesitant to start diuretic at this time, she states this has not been beneficial for her in the past and also d/t hx of drops in SBP.  Continue compression stockings and leg elevation. Low salt, heart healthy diet and regular cardiovascular exercise encouraged.   4. Pericardial effusion Noted on CT scan 10/2022. Noted to be new small pericardial effusion without evidence of cardiac  tamponade. Will continue to monitor over time.   5. Stage 4 lung cancer, elevated ESR/ elevated D-dimer, chronic DOE Pt had some questions regarding recent lab results. Discussed that elevated ESR is seen in cancer patients and elevated D-dimer can also be seen in setting of cancer and most likely d/t this. Pt states she has discussed/reviewed goals of care with husband. No PE seen on CT chest 10/2022 with evidence of trace right pleural effusion.  Continue to follow-up with Oncology and PCP as scheduled.   Addendum 12/01/2022: Admits to chronic, stable DOE. PFT's 10/2022 revealed minimal COPD and moderate diffusion defect to explain her DOE symptoms. Will place referral to Pulmonology in [place] per patient's request. See telephone note dated 12/01/2022.   Dispo: Follow-up with me or APP in 6 weeks or sooner if anything changes.   Signed, Sharlene Dory, NP

## 2022-12-01 NOTE — Patient Instructions (Addendum)
Medication Instructions:   Hold Toprol XL till further instructions Continue all other medications.     Labwork:  none  Testing/Procedures:  none  Follow-Up:e6   6 weeks   Any Other Special Instructions Will Be Listed Below (If Applicable). BP cuff prescription sent to pharmacy Log BP readings x 2 weeks and then notify office You have been referred to PULMONOLOGY.  If you need a refill on your cardiac medications before your next appointment, please call your pharmacy.

## 2022-12-02 NOTE — Telephone Encounter (Signed)
Noted  

## 2022-12-04 DIAGNOSIS — Z510 Encounter for antineoplastic radiation therapy: Secondary | ICD-10-CM | POA: Diagnosis not present
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
## 2022-12-08 ENCOUNTER — Ambulatory Visit (INDEPENDENT_AMBULATORY_CARE_PROVIDER_SITE_OTHER): Payer: Medicare Other | Admitting: Gastroenterology

## 2022-12-08 ENCOUNTER — Encounter: Payer: Self-pay | Admitting: Gastroenterology

## 2022-12-08 ENCOUNTER — Encounter: Payer: Self-pay | Admitting: *Deleted

## 2022-12-08 VITALS — BP 126/75 | HR 121 | Temp 98.1°F | Ht 61.0 in | Wt 141.4 lb

## 2022-12-08 DIAGNOSIS — R6881 Early satiety: Secondary | ICD-10-CM | POA: Diagnosis not present

## 2022-12-08 DIAGNOSIS — Z86010 Personal history of colonic polyps: Secondary | ICD-10-CM | POA: Diagnosis not present

## 2022-12-08 DIAGNOSIS — R634 Abnormal weight loss: Secondary | ICD-10-CM

## 2022-12-08 DIAGNOSIS — R1030 Lower abdominal pain, unspecified: Secondary | ICD-10-CM

## 2022-12-08 DIAGNOSIS — K5900 Constipation, unspecified: Secondary | ICD-10-CM | POA: Diagnosis not present

## 2022-12-08 DIAGNOSIS — D649 Anemia, unspecified: Secondary | ICD-10-CM

## 2022-12-08 NOTE — Patient Instructions (Addendum)
We will get you scheduled for a CT of your abdomen and pelvis.   We will also get you scheduled for a gastric emptying study.   Continue Protonix 40 mg daily.   Start MiraLAX 17 g daily in 8 oz of water or other non-carbonated beverage of your choice.   We will call you with results and recommendations.   It was nice to meet you today!   [name], PA-C [facility]

## 2022-12-09 ENCOUNTER — Ambulatory Visit
Admission: RE | Admit: 2022-12-09 | Discharge: 2022-12-09 | Disposition: A | Discharge status: Home / Self Care | Payer: Medicare Other | Source: Ambulatory Visit | Attending: Radiation Oncology | Admitting: Radiation Oncology

## 2022-12-09 ENCOUNTER — Other Ambulatory Visit: Payer: Self-pay

## 2022-12-09 DIAGNOSIS — Z510 Encounter for antineoplastic radiation therapy: Secondary | ICD-10-CM | POA: Diagnosis not present

## 2022-12-09 LAB — RAD ONC ARIA SESSION SUMMARY
Course Elapsed Days: 0
Plan Fractions Treated to Date: 1
Plan Prescribed Dose Per Fraction: 18 Gy
Plan Total Fractions Prescribed: 3
Plan Total Prescribed Dose: 54 Gy
Reference Point Dosage Given to Date: 18 Gy
Reference Point Session Dosage Given: 18 Gy
Session Number: 1

## 2022-12-10 ENCOUNTER — Ambulatory Visit (HOSPITAL_COMMUNITY)
Admission: RE | Admit: 2022-12-10 | Discharge: 2022-12-10 | Disposition: A | Discharge status: Home / Self Care | Payer: Medicare Other | Source: Ambulatory Visit | Attending: Gastroenterology | Admitting: Gastroenterology

## 2022-12-10 ENCOUNTER — Encounter: Payer: Self-pay | Admitting: Gastroenterology

## 2022-12-10 ENCOUNTER — Ambulatory Visit: Payer: Medicare Other | Admitting: Radiation Oncology

## 2022-12-10 DIAGNOSIS — R1030 Lower abdominal pain, unspecified: Secondary | ICD-10-CM | POA: Insufficient documentation

## 2022-12-10 DIAGNOSIS — D649 Anemia, unspecified: Secondary | ICD-10-CM | POA: Insufficient documentation

## 2022-12-10 DIAGNOSIS — R634 Abnormal weight loss: Secondary | ICD-10-CM | POA: Insufficient documentation

## 2022-12-10 DIAGNOSIS — D539 Nutritional anemia, unspecified: Secondary | ICD-10-CM | POA: Insufficient documentation

## 2022-12-10 MED ORDER — IOHEXOL 300 MG/ML  SOLN
100.0000 mL | Freq: Once | INTRAMUSCULAR | Status: AC | PRN
  Administered 2022-12-10: 100 mL via INTRAVENOUS

## 2022-12-10 MED ORDER — HEPARIN SOD (PORK) LOCK FLUSH 100 UNIT/ML IV SOLN
500.0000 [IU] | Freq: Once | INTRAVENOUS | Status: AC
  Administered 2022-12-10: 500 [IU] via INTRAVENOUS

## 2022-12-11 ENCOUNTER — Ambulatory Visit
Admission: RE | Admit: 2022-12-11 | Discharge: 2022-12-11 | Disposition: A | Discharge status: Home / Self Care | Payer: Medicare Other | Source: Ambulatory Visit | Attending: Radiation Oncology | Admitting: Radiation Oncology

## 2022-12-11 ENCOUNTER — Ambulatory Visit: Payer: Medicare Other

## 2022-12-11 ENCOUNTER — Other Ambulatory Visit: Payer: Self-pay

## 2022-12-11 DIAGNOSIS — Z510 Encounter for antineoplastic radiation therapy: Secondary | ICD-10-CM | POA: Diagnosis not present

## 2022-12-11 LAB — RAD ONC ARIA SESSION SUMMARY
Course Elapsed Days: 2
Plan Fractions Treated to Date: 2
Plan Prescribed Dose Per Fraction: 18 Gy
Plan Total Fractions Prescribed: 3
Plan Total Prescribed Dose: 54 Gy
Reference Point Dosage Given to Date: 36 Gy
Reference Point Session Dosage Given: 18 Gy
Session Number: 2

## 2022-12-11 NOTE — Progress Notes (Signed)
  Radiation Oncology         [phone] ________________________________  Name: Yvette Curry MRN: [id number]  Date: 12/11/2022  DOB: 12-Mar-1956  End of Treatment Note  Diagnosis:   Stage IV, cT1b, N3, M1b NSCLC,adenocarcinoma of the RUL diagnosed in August 2023 with local progression in the RUL.   Indication for treatment:  Curative       Radiation treatment dates:   12/09/22, 12/11/22  Site/dose:   The tumor in the RUL was treated with a course of stereotactic body radiation treatment. The patient will complete her therapy on Monday to total a dose of 54 Gy In 3 fractions at 18 G per fraction.  Narrative: The patient tolerated radiation treatment relatively well.   The patient did not have any signs of acute toxicity during treatment. We spent a good deal of time today reviewing her questions about her treatment, and she was also encouraged to follow up with Dr. Arbutus Ped to discuss this as well. She is also overdue for mammography and encouraged to proceed with breast cancer screening.   The patient will receive a call in about one month from the radiation oncology department. She will continue follow up with Dr. Arbutus Ped as well.      Osker Mason, PAC

## 2022-12-12 ENCOUNTER — Other Ambulatory Visit: Payer: Self-pay | Admitting: Family Medicine

## 2022-12-12 ENCOUNTER — Ambulatory Visit: Payer: Medicare Other | Admitting: Radiation Oncology

## 2022-12-12 ENCOUNTER — Encounter (HOSPITAL_COMMUNITY): Admission: RE | Admit: 2022-12-12 | Payer: Medicare Other | Source: Ambulatory Visit

## 2022-12-12 DIAGNOSIS — Z1231 Encounter for screening mammogram for malignant neoplasm of breast: Secondary | ICD-10-CM

## 2022-12-12 DIAGNOSIS — R6881 Early satiety: Secondary | ICD-10-CM | POA: Insufficient documentation

## 2022-12-12 MED ORDER — TECHNETIUM TC 99M SULFUR COLLOID
2.0000 | Freq: Once | INTRAVENOUS | Status: AC | PRN
  Administered 2022-12-12: 2 via ORAL

## 2022-12-15 ENCOUNTER — Ambulatory Visit: Payer: Medicare Other

## 2022-12-15 ENCOUNTER — Ambulatory Visit
Admission: RE | Admit: 2022-12-15 | Discharge: 2022-12-15 | Disposition: A | Discharge status: Home / Self Care | Payer: Medicare Other | Source: Ambulatory Visit | Attending: Radiation Oncology | Admitting: Radiation Oncology

## 2022-12-15 ENCOUNTER — Other Ambulatory Visit: Payer: Self-pay

## 2022-12-15 DIAGNOSIS — Z510 Encounter for antineoplastic radiation therapy: Secondary | ICD-10-CM | POA: Diagnosis not present

## 2022-12-15 LAB — RAD ONC ARIA SESSION SUMMARY
Course Elapsed Days: 6
Plan Fractions Treated to Date: 3
Plan Prescribed Dose Per Fraction: 18 Gy
Plan Total Fractions Prescribed: 3
Plan Total Prescribed Dose: 54 Gy
Reference Point Dosage Given to Date: 54 Gy
Reference Point Session Dosage Given: 18 Gy
Session Number: 3

## 2022-12-16 ENCOUNTER — Institutional Professional Consult (permissible substitution): Payer: Medicare Other | Admitting: Emergency Medicine

## 2022-12-16 ENCOUNTER — Ambulatory Visit: Payer: Medicare Other

## 2022-12-16 NOTE — Radiation Completion Notes (Addendum)
  Radiation Oncology         [phone] ________________________________  Name: MONET CONDRA MRN: [id number]  Date of Service: 12/15/2022  DOB: December 10, 1955  End of Treatment Note  Diagnosis:  Stage IV, cT1b, N3, M1b NSCLC,adenocarcinoma of the RUL diagnosed in August 2023 with local progression in the RUL.   Intent: Curative     ==========DELIVERED PLANS==========  First Treatment Date: 2022-12-09 - Last Treatment Date: 2022-12-15   Plan Name: Lung_R_SBRT Site: Lung, Right Technique: SBRT/SRT-IMRT Mode: Photon Dose Per Fraction: 18 Gy Prescribed Dose (Delivered / Prescribed): 54 Gy / 54 Gy Prescribed Fxs (Delivered / Prescribed): 3 / 3     ==========ON TREATMENT VISIT DATES========== 2022-12-09, 2022-12-11, 2022-12-15  See weekly On Treatment Notes in Epic for details. The patient tolerated radiation.   The patient will receive a call in about one month from the radiation oncology department. S  will continue follow up with Dr. Arbutus Ped as well.      Osker Mason, PAC

## 2022-12-17 ENCOUNTER — Inpatient Hospital Stay (HOSPITAL_BASED_OUTPATIENT_CLINIC_OR_DEPARTMENT_OTHER): Payer: Medicare Other | Admitting: Internal Medicine

## 2022-12-17 ENCOUNTER — Ambulatory Visit: Payer: Medicare Other | Admitting: Radiation Oncology

## 2022-12-17 ENCOUNTER — Other Ambulatory Visit: Payer: Medicare Other

## 2022-12-17 ENCOUNTER — Inpatient Hospital Stay: Payer: Medicare Other

## 2022-12-17 VITALS — BP 148/92 | HR 88 | Resp 18

## 2022-12-17 DIAGNOSIS — Z5112 Encounter for antineoplastic immunotherapy: Secondary | ICD-10-CM | POA: Diagnosis not present

## 2022-12-17 DIAGNOSIS — T451X5A Adverse effect of antineoplastic and immunosuppressive drugs, initial encounter: Secondary | ICD-10-CM

## 2022-12-17 DIAGNOSIS — C3491 Malignant neoplasm of unspecified part of right bronchus or lung: Secondary | ICD-10-CM

## 2022-12-17 LAB — CBC WITH DIFFERENTIAL (CANCER CENTER ONLY)
Abs Immature Granulocytes: 0.03 10*3/uL (ref 0.00–0.07)
Basophils Absolute: 0 10*3/uL (ref 0.0–0.1)
Basophils Relative: 1 %
Eosinophils Absolute: 0.1 10*3/uL (ref 0.0–0.5)
Eosinophils Relative: 2 %
HCT: 32.6 % — ABNORMAL LOW (ref 36.0–46.0)
Hemoglobin: 10.8 g/dL — ABNORMAL LOW (ref 12.0–15.0)
Immature Granulocytes: 1 %
Lymphocytes Relative: 30 %
Lymphs Abs: 2 10*3/uL (ref 0.7–4.0)
MCH: 36.6 pg — ABNORMAL HIGH (ref 26.0–34.0)
MCHC: 33.1 g/dL (ref 30.0–36.0)
MCV: 110.5 fL — ABNORMAL HIGH (ref 80.0–100.0)
Monocytes Absolute: 0.9 10*3/uL (ref 0.1–1.0)
Monocytes Relative: 13 %
Neutro Abs: 3.5 10*3/uL (ref 1.7–7.7)
Neutrophils Relative %: 53 %
Platelet Count: 294 10*3/uL (ref 150–400)
RBC: 2.95 MIL/uL — ABNORMAL LOW (ref 3.87–5.11)
RDW: 15.8 % — ABNORMAL HIGH (ref 11.5–15.5)
WBC Count: 6.5 10*3/uL (ref 4.0–10.5)
nRBC: 0 % (ref 0.0–0.2)

## 2022-12-17 LAB — CMP (CANCER CENTER ONLY)
ALT: 16 U/L (ref 0–44)
AST: 26 U/L (ref 15–41)
Albumin: 3.5 g/dL (ref 3.5–5.0)
Alkaline Phosphatase: 52 U/L (ref 38–126)
Anion gap: 5 (ref 5–15)
BUN: 14 mg/dL (ref 8–23)
CO2: 29 mmol/L (ref 22–32)
Calcium: 9.9 mg/dL (ref 8.9–10.3)
Chloride: 107 mmol/L (ref 98–111)
Creatinine: 1.03 mg/dL — ABNORMAL HIGH (ref 0.44–1.00)
GFR, Estimated: >60 mL/min (ref >60)
Glucose, Bld: 86 mg/dL (ref 70–99)
Potassium: 4.1 mmol/L (ref 3.5–5.1)
Sodium: 141 mmol/L (ref 135–145)
Total Bilirubin: 0.3 mg/dL (ref 0.3–1.2)
Total Protein: 6.8 g/dL (ref 6.5–8.1)

## 2022-12-17 LAB — TSH: TSH: 5.249 u[IU]/mL — ABNORMAL HIGH (ref 0.350–4.500)

## 2022-12-17 MED ORDER — SODIUM CHLORIDE 0.9 % IV SOLN: Freq: Once | INTRAVENOUS | Status: AC

## 2022-12-17 MED ORDER — SODIUM CHLORIDE 0.9% FLUSH
10.0000 mL | INTRAVENOUS | Status: DC | PRN
  Administered 2022-12-17: 10 mL

## 2022-12-17 MED ORDER — SODIUM CHLORIDE 0.9 % IV SOLN
400.0000 mg/m2 | Freq: Once | INTRAVENOUS | Status: AC
  Administered 2022-12-17: 700 mg via INTRAVENOUS
  Filled 2022-12-17: qty 20

## 2022-12-17 MED ORDER — HEPARIN SOD (PORK) LOCK FLUSH 100 UNIT/ML IV SOLN
500.0000 [IU] | Freq: Once | INTRAVENOUS | Status: AC | PRN
  Administered 2022-12-17: 500 [IU]

## 2022-12-17 MED ORDER — SODIUM CHLORIDE 0.9% FLUSH
10.0000 mL | Freq: Once | INTRAVENOUS | Status: AC
  Administered 2022-12-17: 10 mL

## 2022-12-17 MED ORDER — CYANOCOBALAMIN 1000 MCG/ML IJ SOLN
1000.0000 ug | Freq: Once | INTRAMUSCULAR | Status: AC
  Administered 2022-12-17: 1000 ug via INTRAMUSCULAR
  Filled 2022-12-17: qty 1

## 2022-12-17 MED ORDER — SODIUM CHLORIDE 0.9 % IV SOLN
200.0000 mg | Freq: Once | INTRAVENOUS | Status: AC
  Administered 2022-12-17: 200 mg via INTRAVENOUS
  Filled 2022-12-17: qty 200

## 2022-12-17 NOTE — Progress Notes (Signed)
Pt informed this RN that she took prescribed 10 mg compazine PO at 0930 prior to infusion appt today.

## 2022-12-17 NOTE — Patient Instructions (Signed)
[facility]  Discharge Instructions: Thank you for choosing [facility] to provide your oncology and hematology care.   If you have a lab appointment with the Cancer Center, please go directly to the Cancer Center and check in at the registration area.   Wear comfortable clothing and clothing appropriate for easy access to any Portacath or PICC line.   We strive to give you quality time with your provider. You may need to reschedule your appointment if you arrive late (15 or more minutes).  Arriving late affects you and other patients whose appointments are after yours.  Also, if you miss three or more appointments without notifying the office, you may be dismissed from the clinic at the provider's discretion.      For prescription refill requests, have your pharmacy contact our office and allow 72 hours for refills to be completed.    Today you received the following chemotherapy and/or immunotherapy agents: Keytruda/Alimta      To help prevent nausea and vomiting after your treatment, we encourage you to take your nausea medication as directed.  BELOW ARE SYMPTOMS THAT SHOULD BE REPORTED IMMEDIATELY: *FEVER GREATER THAN 100.4 F (38 C) OR HIGHER *CHILLS OR SWEATING *NAUSEA AND VOMITING THAT IS NOT CONTROLLED WITH YOUR NAUSEA MEDICATION *UNUSUAL SHORTNESS OF BREATH *UNUSUAL BRUISING OR BLEEDING *URINARY PROBLEMS (pain or burning when urinating, or frequent urination) *BOWEL PROBLEMS (unusual diarrhea, constipation, pain near the anus) TENDERNESS IN MOUTH AND THROAT WITH OR WITHOUT PRESENCE OF ULCERS (sore throat, sores in mouth, or a toothache) UNUSUAL RASH, SWELLING OR PAIN  UNUSUAL VAGINAL DISCHARGE OR ITCHING   Items with * indicate a potential emergency and should be followed up as soon as possible or go to the Emergency Department if any problems should occur.  Please show the CHEMOTHERAPY ALERT CARD or IMMUNOTHERAPY ALERT CARD at  check-in to the Emergency Department and triage nurse.  Should you have questions after your visit or need to cancel or reschedule your appointment, please contact [facility]  Dept: [phone]  and follow the prompts.  Office hours are 8:00 a.m. to 4:30 p.m. Monday - Friday. Please note that voicemails left after 4:00 p.m. may not be returned until the following business day.  We are closed weekends and major holidays. You have access to a nurse at all times for urgent questions. Please call the main number to the clinic Dept: [phone] and follow the prompts.   For any non-urgent questions, you may also contact your provider using MyChart. We now offer e-Visits for anyone 18 and older to request care online for non-urgent symptoms. For details visit [email or web address].   Also download the MyChart app! Go to the app store, search "MyChart", open the app, select , and log in with your MyChart username and password.   
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
## 2022-12-18 ENCOUNTER — Encounter: Payer: Self-pay | Admitting: Pulmonary Disease

## 2022-12-18 ENCOUNTER — Ambulatory Visit (INDEPENDENT_AMBULATORY_CARE_PROVIDER_SITE_OTHER): Payer: Medicare Other | Admitting: Pulmonary Disease

## 2022-12-18 VITALS — BP 110/80 | HR 108 | Ht 61.0 in | Wt 144.4 lb

## 2022-12-18 DIAGNOSIS — R0609 Other forms of dyspnea: Secondary | ICD-10-CM

## 2022-12-18 DIAGNOSIS — R0602 Shortness of breath: Secondary | ICD-10-CM

## 2022-12-18 DIAGNOSIS — C3491 Malignant neoplasm of unspecified part of right bronchus or lung: Secondary | ICD-10-CM

## 2022-12-18 LAB — T4: T4, Total: 9.2 ug/dL (ref 4.5–12.0)

## 2022-12-18 MED ORDER — BREZTRI AEROSPHERE 160-9-4.8 MCG/ACT IN AERO: 2.0000 | INHALATION_SPRAY | Freq: Two times a day (BID) | RESPIRATORY_TRACT | Status: DC

## 2022-12-18 MED ORDER — AEROCHAMBER MV MISC: 0 refills | Status: DC

## 2022-12-18 MED ORDER — BREZTRI AEROSPHERE 160-9-4.8 MCG/ACT IN AERO: 2.0000 | INHALATION_SPRAY | Freq: Two times a day (BID) | RESPIRATORY_TRACT | 3 refills | Status: DC

## 2022-12-18 NOTE — Addendum Note (Signed)
Addended by: Hedda Slade on: 12/18/2022 02:00 PM   Modules accepted: Orders

## 2022-12-18 NOTE — Patient Instructions (Signed)
Thank you for visiting Dr. Tonia Brooms at [facility]. Today we recommend the following:  Breztri samples New prescription  Return in about 8 years (around 12/18/2030) for with APP.    Please do your part to reduce the spread of COVID-19.
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
## 2022-12-18 NOTE — Addendum Note (Signed)
Addended by: Hedda Slade on: 12/18/2022 04:44 PM   Modules accepted: Orders

## 2022-12-18 NOTE — Addendum Note (Signed)
Addended by: Hedda Slade on: 12/18/2022 02:05 PM   Modules accepted: Orders

## 2022-12-19 ENCOUNTER — Ambulatory Visit: Payer: Medicare Other | Admitting: Radiation Oncology

## 2022-12-23 ENCOUNTER — Telehealth: Payer: Self-pay

## 2022-12-23 ENCOUNTER — Telehealth: Payer: Self-pay | Admitting: Pulmonary Disease

## 2022-12-23 NOTE — Telephone Encounter (Signed)
-----   Message from Vishnu P Mallipeddi sent at 11/26/2022  6:43 PM EDT ----- Normal event monitor except that her symptoms correlated with normal sinus rhythm and sinus tachycardia.  No arrhythmias or conduction system issues to explain her symptoms. Average HR is 91 bpm. Already on metoprolol succinate 12.5 mg once daily, cannot uptitrate due to soft blood pressures.

## 2022-12-23 NOTE — Telephone Encounter (Signed)
Followed up on patient monitor results. Patient informed and verbalized understanding. Also sent PCP copy.  Since monitor patient has had some issues that has been documented in previous telephone notes. She has been off Metoprolol succinate 12.5 mg for 2 weeks now per Sharlene Dory. Has been keeping a blood pressure log. Will send in via MyChart today at some point. She was also sent to Pulmonology as well. They placed her on a inhaler Markus Daft). Reported thst she has been feeling better since then and has been sleeping a lot better. Had some swelling in ankles and that has went down as well.

## 2022-12-23 NOTE — Telephone Encounter (Signed)
Pt has had allergic reaction on the past to prednisone ( Throwing up light headed lost appeitite, stomach pains) had chemo on wed and pharmacy wants to make sure it is okay for pt to have prednisone

## 2022-12-24 ENCOUNTER — Encounter: Payer: Self-pay | Admitting: *Deleted

## 2022-12-25 ENCOUNTER — Telehealth: Payer: Self-pay | Admitting: Medical Oncology

## 2022-12-25 ENCOUNTER — Telehealth: Payer: Self-pay | Admitting: *Deleted

## 2022-12-25 ENCOUNTER — Encounter: Payer: Self-pay | Admitting: *Deleted

## 2022-12-25 NOTE — Telephone Encounter (Signed)
Advised patient Dr Tonia Brooms recommendation.

## 2022-12-25 NOTE — Telephone Encounter (Signed)
Pt notified she can schedule colonoscopy 2 weeks after chemotherapy.

## 2022-12-25 NOTE — Telephone Encounter (Signed)
She needs to schedule a colonoscopy /endoscopy. HX polyps Can it be scheduled 2 weeks after her treatment?

## 2022-12-25 NOTE — Telephone Encounter (Signed)
Ok. I am glad her bowels are moving. She can continue the MiraLAX.  She can increase MiraLAX to twice daily if she feels that her constipation begins to worsen with only taking MiraLAX once a day.

## 2022-12-25 NOTE — Telephone Encounter (Signed)
Do we have sample of Linzess 145 mcg? Would like for her to try this daily 30 minutes before breakfast. Please provide 2 sample boxes and have her call next week with a progress report.

## 2022-12-25 NOTE — Telephone Encounter (Signed)
Spoke to pt, she informed me that she had 4 bowel movements today. She states she does not think she need Linzess . She states she gets constipated and abdominal pain after she has chemotherapy. She has been very tired an weak for the last couple of days. She states she has no energy. She does chemotherapy every 3 weeks.

## 2022-12-26 NOTE — Telephone Encounter (Signed)
Spoke with pt yesterday. Pt says she needs to have procedures 2 weeks after her chemo. She had chemo on 01/07/23. Provider doesn't have anything before that time. We will call her once we get providers Oct schedule.

## 2022-12-26 NOTE — Telephone Encounter (Signed)
Tammy, I assume patient is requesting a call from you in regards to scheduling procedures. Let me know if you need anything from me.

## 2022-12-26 NOTE — Telephone Encounter (Signed)
Noted  

## 2022-12-29 ENCOUNTER — Telehealth: Payer: Self-pay | Admitting: Medical Oncology

## 2022-12-29 ENCOUNTER — Other Ambulatory Visit: Payer: Self-pay | Admitting: *Deleted

## 2022-12-29 MED ORDER — MIDODRINE HCL 2.5 MG PO TABS: 2.5000 mg | ORAL_TABLET | Freq: Three times a day (TID) | ORAL | 1 refills | Status: DC | PRN

## 2022-12-29 NOTE — Telephone Encounter (Signed)
New medication-Cardiologist prescribed Midrodine2.5 mg tid prn for BP <90/60. Is this ok to take"?

## 2022-12-29 NOTE — Telephone Encounter (Signed)
Cardiology started pt on Midrodine after provider reviewed her BP log and she had systolic readings <90.  I told her to take it as prescribed (" take prn for BP systolic < 90.")

## 2022-12-30 ENCOUNTER — Encounter: Payer: Self-pay | Admitting: Physician Assistant

## 2022-12-30 ENCOUNTER — Encounter: Payer: Self-pay | Admitting: Internal Medicine

## 2022-12-30 NOTE — Telephone Encounter (Signed)
Noted. Informed pt.  

## 2022-12-31 ENCOUNTER — Ambulatory Visit
Admission: RE | Admit: 2022-12-31 | Discharge: 2022-12-31 | Disposition: A | Discharge status: Home / Self Care | Payer: Medicare Other | Source: Ambulatory Visit | Attending: Family Medicine | Admitting: Family Medicine

## 2022-12-31 DIAGNOSIS — Z1231 Encounter for screening mammogram for malignant neoplasm of breast: Secondary | ICD-10-CM
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
## 2023-01-05 ENCOUNTER — Other Ambulatory Visit: Payer: Self-pay | Admitting: Family Medicine

## 2023-01-05 ENCOUNTER — Encounter (INDEPENDENT_AMBULATORY_CARE_PROVIDER_SITE_OTHER): Payer: Self-pay | Admitting: Ophthalmology

## 2023-01-05 ENCOUNTER — Ambulatory Visit (INDEPENDENT_AMBULATORY_CARE_PROVIDER_SITE_OTHER): Payer: Medicare Other | Admitting: Ophthalmology

## 2023-01-05 ENCOUNTER — Telehealth: Payer: Self-pay

## 2023-01-05 DIAGNOSIS — Z961 Presence of intraocular lens: Secondary | ICD-10-CM | POA: Diagnosis not present

## 2023-01-05 DIAGNOSIS — R928 Other abnormal and inconclusive findings on diagnostic imaging of breast: Secondary | ICD-10-CM

## 2023-01-05 DIAGNOSIS — H04123 Dry eye syndrome of bilateral lacrimal glands: Secondary | ICD-10-CM

## 2023-01-05 DIAGNOSIS — C3491 Malignant neoplasm of unspecified part of right bronchus or lung: Secondary | ICD-10-CM

## 2023-01-05 DIAGNOSIS — H35711 Central serous chorioretinopathy, right eye: Secondary | ICD-10-CM | POA: Diagnosis not present

## 2023-01-05 NOTE — Telephone Encounter (Signed)
Pt is calling she has cancer and it is getting harder to stand in shower and needs a RX for a shower Chair and wants to know if she can get a DMV Handicap because it is getting harder for her to breathe.    Yvette Curry [phone]

## 2023-01-07 ENCOUNTER — Inpatient Hospital Stay: Payer: Medicare Other

## 2023-01-07 ENCOUNTER — Inpatient Hospital Stay (HOSPITAL_BASED_OUTPATIENT_CLINIC_OR_DEPARTMENT_OTHER): Payer: Medicare Other | Admitting: Physician Assistant

## 2023-01-07 ENCOUNTER — Inpatient Hospital Stay: Payer: Medicare Other | Attending: Internal Medicine

## 2023-01-07 VITALS — BP 142/82 | HR 95 | Temp 97.9°F | Resp 16 | Wt 145.2 lb

## 2023-01-07 VITALS — BP 127/84 | HR 94

## 2023-01-07 DIAGNOSIS — D649 Anemia, unspecified: Secondary | ICD-10-CM | POA: Diagnosis not present

## 2023-01-07 DIAGNOSIS — C778 Secondary and unspecified malignant neoplasm of lymph nodes of multiple regions: Secondary | ICD-10-CM | POA: Diagnosis not present

## 2023-01-07 DIAGNOSIS — T451X5A Adverse effect of antineoplastic and immunosuppressive drugs, initial encounter: Secondary | ICD-10-CM

## 2023-01-07 DIAGNOSIS — C3411 Malignant neoplasm of upper lobe, right bronchus or lung: Secondary | ICD-10-CM | POA: Diagnosis present

## 2023-01-07 DIAGNOSIS — Z5112 Encounter for antineoplastic immunotherapy: Secondary | ICD-10-CM | POA: Insufficient documentation

## 2023-01-07 DIAGNOSIS — Z923 Personal history of irradiation: Secondary | ICD-10-CM | POA: Diagnosis not present

## 2023-01-07 DIAGNOSIS — Z5111 Encounter for antineoplastic chemotherapy: Secondary | ICD-10-CM | POA: Diagnosis present

## 2023-01-07 DIAGNOSIS — C3491 Malignant neoplasm of unspecified part of right bronchus or lung: Secondary | ICD-10-CM

## 2023-01-07 LAB — CMP (CANCER CENTER ONLY)
ALT: 12 U/L (ref 0–44)
AST: 22 U/L (ref 15–41)
Albumin: 3.4 g/dL — ABNORMAL LOW (ref 3.5–5.0)
Alkaline Phosphatase: 56 U/L (ref 38–126)
Anion gap: 5 (ref 5–15)
BUN: 14 mg/dL (ref 8–23)
CO2: 31 mmol/L (ref 22–32)
Calcium: 9.6 mg/dL (ref 8.9–10.3)
Chloride: 106 mmol/L (ref 98–111)
Creatinine: 1.02 mg/dL — ABNORMAL HIGH (ref 0.44–1.00)
GFR, Estimated: >60 mL/min (ref >60)
Glucose, Bld: 90 mg/dL (ref 70–99)
Potassium: 3.9 mmol/L (ref 3.5–5.1)
Sodium: 142 mmol/L (ref 135–145)
Total Bilirubin: 0.4 mg/dL (ref 0.3–1.2)
Total Protein: 6.7 g/dL (ref 6.5–8.1)

## 2023-01-07 LAB — CBC WITH DIFFERENTIAL (CANCER CENTER ONLY)
Abs Immature Granulocytes: 0.03 10*3/uL (ref 0.00–0.07)
Basophils Absolute: 0.1 10*3/uL (ref 0.0–0.1)
Basophils Relative: 1 %
Eosinophils Absolute: 0.1 10*3/uL (ref 0.0–0.5)
Eosinophils Relative: 2 %
HCT: 30.9 % — ABNORMAL LOW (ref 36.0–46.0)
Hemoglobin: 10.2 g/dL — ABNORMAL LOW (ref 12.0–15.0)
Immature Granulocytes: 1 %
Lymphocytes Relative: 26 %
Lymphs Abs: 1.7 10*3/uL (ref 0.7–4.0)
MCH: 37.2 pg — ABNORMAL HIGH (ref 26.0–34.0)
MCHC: 33 g/dL (ref 30.0–36.0)
MCV: 112.8 fL — ABNORMAL HIGH (ref 80.0–100.0)
Monocytes Absolute: 0.9 10*3/uL (ref 0.1–1.0)
Monocytes Relative: 14 %
Neutro Abs: 3.8 10*3/uL (ref 1.7–7.7)
Neutrophils Relative %: 56 %
Platelet Count: 261 10*3/uL (ref 150–400)
RBC: 2.74 MIL/uL — ABNORMAL LOW (ref 3.87–5.11)
RDW: 17.7 % — ABNORMAL HIGH (ref 11.5–15.5)
WBC Count: 6.6 10*3/uL (ref 4.0–10.5)
nRBC: 0 % (ref 0.0–0.2)

## 2023-01-07 MED ORDER — HEPARIN SOD (PORK) LOCK FLUSH 100 UNIT/ML IV SOLN
500.0000 [IU] | Freq: Once | INTRAVENOUS | Status: AC
  Administered 2023-01-07: 500 [IU]

## 2023-01-07 MED ORDER — SODIUM CHLORIDE 0.9% FLUSH
10.0000 mL | INTRAVENOUS | Status: DC | PRN
  Administered 2023-01-07: 10 mL

## 2023-01-07 MED ORDER — SODIUM CHLORIDE 0.9 % IV SOLN: Freq: Once | INTRAVENOUS | Status: AC

## 2023-01-07 MED ORDER — SODIUM CHLORIDE 0.9 % IV SOLN
200.0000 mg | Freq: Once | INTRAVENOUS | Status: AC
  Administered 2023-01-07: 200 mg via INTRAVENOUS
  Filled 2023-01-07: qty 200

## 2023-01-07 MED ORDER — HEPARIN SOD (PORK) LOCK FLUSH 100 UNIT/ML IV SOLN
500.0000 [IU] | Freq: Once | INTRAVENOUS | Status: AC | PRN
  Administered 2023-01-07: 500 [IU]

## 2023-01-07 MED ORDER — SODIUM CHLORIDE 0.9% FLUSH
10.0000 mL | Freq: Once | INTRAVENOUS | Status: AC
  Administered 2023-01-07: 10 mL

## 2023-01-07 MED ORDER — SODIUM CHLORIDE 0.9 % IV SOLN
400.0000 mg/m2 | Freq: Once | INTRAVENOUS | Status: AC
  Administered 2023-01-07: 700 mg via INTRAVENOUS
  Filled 2023-01-07: qty 20

## 2023-01-07 NOTE — Patient Instructions (Signed)
[facility]  Discharge Instructions: Thank you for choosing [facility] to provide your oncology and hematology care.   If you have a lab appointment with the Cancer Center, please go directly to the Cancer Center and check in at the registration area.   Wear comfortable clothing and clothing appropriate for easy access to any Portacath or PICC line.   We strive to give you quality time with your provider. You may need to reschedule your appointment if you arrive late (15 or more minutes).  Arriving late affects you and other patients whose appointments are after yours.  Also, if you miss three or more appointments without notifying the office, you may be dismissed from the clinic at the provider's discretion.      For prescription refill requests, have your pharmacy contact our office and allow 72 hours for refills to be completed.    Today you received the following chemotherapy and/or immunotherapy agents: Keytruda/Alimta      To help prevent nausea and vomiting after your treatment, we encourage you to take your nausea medication as directed.  BELOW ARE SYMPTOMS THAT SHOULD BE REPORTED IMMEDIATELY: *FEVER GREATER THAN 100.4 F (38 C) OR HIGHER *CHILLS OR SWEATING *NAUSEA AND VOMITING THAT IS NOT CONTROLLED WITH YOUR NAUSEA MEDICATION *UNUSUAL SHORTNESS OF BREATH *UNUSUAL BRUISING OR BLEEDING *URINARY PROBLEMS (pain or burning when urinating, or frequent urination) *BOWEL PROBLEMS (unusual diarrhea, constipation, pain near the anus) TENDERNESS IN MOUTH AND THROAT WITH OR WITHOUT PRESENCE OF ULCERS (sore throat, sores in mouth, or a toothache) UNUSUAL RASH, SWELLING OR PAIN  UNUSUAL VAGINAL DISCHARGE OR ITCHING   Items with * indicate a potential emergency and should be followed up as soon as possible or go to the Emergency Department if any problems should occur.  Please show the CHEMOTHERAPY ALERT CARD or IMMUNOTHERAPY ALERT CARD at  check-in to the Emergency Department and triage nurse.  Should you have questions after your visit or need to cancel or reschedule your appointment, please contact [facility]  Dept: [phone]  and follow the prompts.  Office hours are 8:00 a.m. to 4:30 p.m. Monday - Friday. Please note that voicemails left after 4:00 p.m. may not be returned until the following business day.  We are closed weekends and major holidays. You have access to a nurse at all times for urgent questions. Please call the main number to the clinic Dept: [phone] and follow the prompts.   For any non-urgent questions, you may also contact your provider using MyChart. We now offer e-Visits for anyone 44 and older to request care online for non-urgent symptoms. For details visit [email or web address].   Also download the MyChart app! Go to the app store, search "MyChart", open the app, select [facility], and log in with your MyChart username and password.

## 2023-01-11 NOTE — Progress Notes (Unsigned)
Cardiology Office Note:  .   Date: 01/12/2023 ID:  Yvette Curry, DOB October 06, 1955, MRN [id number] PCP: [name], DO  [facility] Providers Cardiologist:  Marjo Bicker, MD    History of Present Illness: .   Yvette Curry is a 67 y.o. female with a PMH of HFmrEF, HTN, dizziness, swelling, and stage 4 lung cancer on chemotherapy, who presents today for swelling evaluation.   Diagnosed with stage IV lung cancer with metastasis in August 2023.  It was noted she was having chronic DOE, was referred to cardiology for evaluation.  Echocardiogram revealed mildly reduced EF at 45 to 50%, started on Lopressor 12.5 mg daily.  GDMT limited due to soft blood pressures.  Last seen by Dr. Jenene Slicker on October 09, 2022.  At this office visit, she noted dizziness with standing and sometimes with walking.  Orthostatics were negative for orthostatic hypotension/POTS.  Evaluated by vascular surgery for leg swelling likely due to chronic venous insufficiency and deemed to be secondary due to chemotherapy. 1 week monitor benign.  PFTs revealed minimal COPD and moderate diffusion defect which explained to her DOE.  Last seen for follow-up on December 01, 2022 for swelling evaluation with her husband.  Presented with multiple chief concerns.  Reported feeling fatigued, patient and husband stated BP had been labile, and husband reported SBP dropping to 70 at one time, had to receive IV fluids at [facility], also admitted to slightly fast heart rate, reported hx of [facility]. Had not been checking/logging BP consistently. Admitted to some dizziness at times.  Noted chronic leg edema, was on several diuretics that did not make a difference according to her report. Was wearing compression stockings and elevating her legs, said this did not help. Denied any chest pain, palpitations, syncope, presyncope, orthopnea, PND, significant weight changes, acute bleeding, or claudication. She also had some concerns/questions about her  recent lab work results. Admitted to chronic, stable DOE and denied any worsening symptoms related to her breathing.  01/12/2023- Presents today for follow-up with husband. Doesn't feel good. Has been getting chemo and recently has been having nausea. Presents BP and HR log. Overall shows HR in upper 90's/ low 100's. HR today averaging 120's- 130's with BP hypotensive today. BP log overall shows low normal SBP readings. Says she has been trying to stay well hydrated. Denies any chest pain, palpitations, syncope, presyncope, dizziness, orthopnea, PND, swelling or significant weight changes, acute bleeding, or claudication.  Does admit to feeling weaker and having intermittent shortness of breath more noticed with exertion, unsure if this appears worse for her. Has not taken midodrine at all according to her report.   FH: CHF, biological mother had LVAD and ICD. Biological sister had SSS, s/p PPM in her 40s LVEF 45%.   Studies Reviewed: Marland Kitchen    EKG:  EKG Interpretation Date/Time:  Monday January 12 2023 10:56:32 EDT Ventricular Rate:  119 PR Interval:  130 QRS Duration:  72 QT Interval:  286 QTC Calculation: 402 R Axis:   96  Text Interpretation: Sinus tachycardia Rightward axis Low voltage QRS When compared with ECG of 20-Oct-2022 20:30, No significant change was found Confirmed by Sharlene Dory [phone]) on 01/12/2023 10:59:06 AM   Cardiac monitor 11/2022:    Patch wear time was 8 days.   Normal sinus rhythm predominantly ranging from 62 to 137 bpm with an average HR 91 bpm.   No atrial or ventricular arrhythmias.   No AV block or pauses.   <1%  PAC burden and <1% PVC burden.   Patient triggered events correlated with NSR (80-130 BPM) and ectopy.   Vascular ultrasound lower right extremity 06/2022:  Summary:  Right:  - No evidence of deep vein thrombosis seen in the right lower extremity,  from the common femoral through the popliteal veins.  - No evidence of superficial venous thrombosis  in the right lower  extremity.    - The deep venous system is incompetent.  - The great saphenous vein is incompetent.  - The small saphenous vein is incompetent.   *See table(s) above for measurements and observations.  Echo 06/2022:  1. Left ventricular ejection fraction, by estimation, is 45 to 50%. Left  ventricular ejection fraction by 3D volume is 45 %. The left ventricle has  mildly decreased function. The left ventricle demonstrates global  hypokinesis. Left ventricular diastolic   parameters are consistent with Grade I diastolic dysfunction (impaired  relaxation).   2. Right ventricular systolic function is normal. The right ventricular  size is normal. There is normal pulmonary artery systolic pressure. The  estimated right ventricular systolic pressure is 16.0 mmHg.   3. The mitral valve is normal in structure. No evidence of mitral valve  regurgitation. No evidence of mitral stenosis.   4. The aortic valve is tricuspid. Aortic valve regurgitation is mild. No  aortic stenosis is present.   5. The inferior vena cava is normal in size with greater than 50%  respiratory variability, suggesting right atrial pressure of 3 mmHg.  Physical Exam:   VS:  BP (!) 90/50   Pulse (!) 124   Ht 5\' 1"  (1.549 m)   Wt 140 lb 9.6 oz (63.8 kg)   SpO2 95%   BMI 26.57 kg/m    Wt Readings from Last 3 Encounters:  01/12/23 140 lb 9.6 oz (63.8 kg)  01/07/23 145 lb 3.2 oz (65.9 kg)  12/18/22 144 lb 6.4 oz (65.5 kg)    Repeat BP: 90/60  GEN: Well nourished, well developed in no acute distress NECK: No JVD; No carotid bruits CARDIAC: S1/S2, regular rhythm and fast rate, no murmurs, rubs, gallops RESPIRATORY:  Clear to auscultation without rales, wheezing or rhonchi  ABDOMEN: Soft, non-tender, non-distended EXTREMITIES:  Nonpitting edema to BLE; No deformity   ASSESSMENT AND PLAN: .    HFmrEF Stage C, NYHA class I-II symptoms. EF 45-50%. Euvolemic and well compensated on exam. Weight  is stable. GDMT limited d/t BP today. Metoprolol has been on hold, pt has not taken midodrine per her report. Case d/w Dr. Jenene Slicker in office today, Dr. Jenene Slicker recommended to start Midodrine 5 mg TID and hold off GDMT at this time to prevent dizziness, which I am in agreement with. Will continue to monitor her HR and BP and let us know her readings via MyChart. Hesitant to start SGLT2 inhibitor due to the fact that she has poor appetite currently while receiving chemo treatments. Low sodium diet, fluid restriction <2L, and daily weights encouraged. Educated to contact our office for weight gain of 2 lbs overnight or 5 lbs in one week. ED precautions discussed.  Hypotension, Hx of HTN, fatigue, weakness, tachycardia Etiology multifactorial. BP is hypotensive today.  Past orthostatics were negative.  HR elevated today. Will continue to hold metoprolol succinate d/t hypotension. Starting midodrine 5 mg TID as mentioned above. Discussed to monitor BP at home at least 2 hours after medications and sitting for 5-10 minutes. Given BP log. ED precautions discussed.  Leg edema Nonpitting, stable symptoms  on exam. This is secondary due to chronic venous insufficiency.  No evidence of DVT on Doppler from January 2024.  Vascular ultrasound study in March 2024 showed incompetent right lower extremity veins with no evidence of DVT along the right lower extremity.  Hesitant to start diuretic at this time, she states this has not been beneficial for her in the past and also d/t hx of drops in SBP.  Continue compression stockings and leg elevation. Low salt, heart healthy diet and regular cardiovascular exercise encouraged.   4. Pericardial effusion Noted on CT scan 10/2022. Noted to be new small pericardial effusion without evidence of cardiac tamponade. Will continue to monitor over time.   5. Stage 4 lung cancer, chronic DOE Chronic, overall symptoms appear stable. Continue to follow-up with Oncology,  Pulmonology, and PCP as scheduled.   Dispo: Care and ED precautions discussed. Follow-up with me or [facility] in 4-6 weeks or sooner if anything changes.   Signed, Sharlene Dory, NP

## 2023-01-12 ENCOUNTER — Ambulatory Visit: Payer: Medicare Other | Attending: Nurse Practitioner | Admitting: Nurse Practitioner

## 2023-01-12 ENCOUNTER — Encounter: Payer: Self-pay | Admitting: Nurse Practitioner

## 2023-01-12 VITALS — BP 90/50 | HR 124 | Ht 61.0 in | Wt 140.6 lb

## 2023-01-12 DIAGNOSIS — I10 Essential (primary) hypertension: Secondary | ICD-10-CM

## 2023-01-12 DIAGNOSIS — I5020 Unspecified systolic (congestive) heart failure: Secondary | ICD-10-CM

## 2023-01-12 DIAGNOSIS — R000 Tachycardia, unspecified: Secondary | ICD-10-CM

## 2023-01-12 DIAGNOSIS — I5022 Chronic systolic (congestive) heart failure: Secondary | ICD-10-CM

## 2023-01-12 DIAGNOSIS — C3491 Malignant neoplasm of unspecified part of right bronchus or lung: Secondary | ICD-10-CM

## 2023-01-12 DIAGNOSIS — R5383 Other fatigue: Secondary | ICD-10-CM

## 2023-01-12 DIAGNOSIS — R600 Localized edema: Secondary | ICD-10-CM

## 2023-01-12 DIAGNOSIS — I959 Hypotension, unspecified: Secondary | ICD-10-CM | POA: Diagnosis not present

## 2023-01-12 DIAGNOSIS — R531 Weakness: Secondary | ICD-10-CM

## 2023-01-12 DIAGNOSIS — R0609 Other forms of dyspnea: Secondary | ICD-10-CM

## 2023-01-12 DIAGNOSIS — I3139 Other pericardial effusion (noninflammatory): Secondary | ICD-10-CM

## 2023-01-12 MED ORDER — MIDODRINE HCL 5 MG PO TABS: 2.5000 mg | ORAL_TABLET | Freq: Three times a day (TID) | ORAL | 1 refills | Status: DC

## 2023-01-12 NOTE — Patient Instructions (Addendum)
Medication Instructions:  Your physician has recommended you make the following change in your medication:  Please start taking Midodrine 5 Mg Three times a day with meals  Continue all other medications as prescribed   Labwork: None   Testing/Procedures: None   Follow-Up: Your physician recommends that you schedule a follow-up appointment in: 4-6 weeks   Any Other Special Instructions Will Be Listed Below (If Applicable).  If you need a refill on your cardiac medications before your next appointment, please call your pharmacy.

## 2023-01-14 ENCOUNTER — Telehealth: Payer: Self-pay | Admitting: *Deleted

## 2023-01-14 ENCOUNTER — Telehealth: Payer: Self-pay | Admitting: Internal Medicine

## 2023-01-14 DIAGNOSIS — M79604 Pain in right leg: Secondary | ICD-10-CM

## 2023-01-14 DIAGNOSIS — R600 Localized edema: Secondary | ICD-10-CM

## 2023-01-14 MED ORDER — MIDODRINE HCL 5 MG PO TABS: 5.0000 mg | ORAL_TABLET | Freq: Three times a day (TID) | ORAL | 1 refills | Status: DC

## 2023-01-14 NOTE — Telephone Encounter (Signed)
Pharmacy calling back stating they have a few questions regarding some medications. Please advise

## 2023-01-14 NOTE — Telephone Encounter (Signed)
Concern: with symptoms of right LE pain above ankle going towards the calf has became getting worse since started 2 day ago Hurts to walk.   Location: right leg  Description: continuous    Aggravating Factors: nothing  Quality: cramping and tight (pulling)  Treatments: none  Consulted: V.W. Myra Gianotti, MD  Resolution: Instructed to call back if symptoms perist  Next Appt: Appointment scheduled for  DVT study tomorrow 01/15/23 @ 2pm

## 2023-01-14 NOTE — Telephone Encounter (Signed)
[facility] questioned midodrine rx sent on 01/12/2023 5 mg with directions to take 2.5 mg three times daily and with a note that said New dated 12/29/2022. [facility] reports patient was advised this was changed to whole tablet three times daily. Advised that per Peck's note, midodrine 5 mg was increased to one whole tablet three times daily per Mallipeddi's request. "Case d/w Dr. Jenene Slicker in office today, Dr. Jenene Slicker recommended to start Midodrine 5 mg TID and hold off GDMT at this time to prevent dizziness, which I am in agreement with." Advised that a new prescription would be sent to reflect this change.

## 2023-01-15 ENCOUNTER — Ambulatory Visit (HOSPITAL_COMMUNITY)
Admission: RE | Admit: 2023-01-15 | Discharge: 2023-01-15 | Disposition: A | Discharge status: Home / Self Care | Payer: Medicare Other | Source: Ambulatory Visit | Attending: Vascular Surgery | Admitting: Vascular Surgery

## 2023-01-15 DIAGNOSIS — R600 Localized edema: Secondary | ICD-10-CM | POA: Diagnosis present

## 2023-01-15 DIAGNOSIS — M79604 Pain in right leg: Secondary | ICD-10-CM | POA: Diagnosis present
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
## 2023-01-19 ENCOUNTER — Encounter (INDEPENDENT_AMBULATORY_CARE_PROVIDER_SITE_OTHER): Payer: Self-pay | Admitting: Ophthalmology

## 2023-01-19 ENCOUNTER — Ambulatory Visit (INDEPENDENT_AMBULATORY_CARE_PROVIDER_SITE_OTHER): Payer: Medicare Other | Admitting: Ophthalmology

## 2023-01-19 DIAGNOSIS — H04123 Dry eye syndrome of bilateral lacrimal glands: Secondary | ICD-10-CM | POA: Diagnosis not present

## 2023-01-19 DIAGNOSIS — C3491 Malignant neoplasm of unspecified part of right bronchus or lung: Secondary | ICD-10-CM

## 2023-01-19 DIAGNOSIS — H353211 Exudative age-related macular degeneration, right eye, with active choroidal neovascularization: Secondary | ICD-10-CM | POA: Diagnosis not present

## 2023-01-19 DIAGNOSIS — Z961 Presence of intraocular lens: Secondary | ICD-10-CM | POA: Diagnosis not present

## 2023-01-19 DIAGNOSIS — H35711 Central serous chorioretinopathy, right eye: Secondary | ICD-10-CM

## 2023-01-19 MED ORDER — BEVACIZUMAB CHEMO INJECTION 1.25MG/0.05ML SYRINGE FOR KALEIDOSCOPE
1.2500 mg | INTRAVITREAL | Status: AC | PRN
Start: 2023-01-19 — End: 2023-01-19
  Administered 2023-01-19: 1.25 mg via INTRAVITREAL

## 2023-01-21 ENCOUNTER — Encounter (HOSPITAL_COMMUNITY): Payer: Self-pay

## 2023-01-21 ENCOUNTER — Ambulatory Visit (HOSPITAL_COMMUNITY)
Admission: RE | Admit: 2023-01-21 | Discharge: 2023-01-21 | Disposition: A | Discharge status: Home / Self Care | Payer: Medicare Other | Source: Ambulatory Visit | Attending: Physician Assistant | Admitting: Physician Assistant

## 2023-01-21 DIAGNOSIS — C3491 Malignant neoplasm of unspecified part of right bronchus or lung: Secondary | ICD-10-CM | POA: Diagnosis present

## 2023-01-21 MED ORDER — HEPARIN SOD (PORK) LOCK FLUSH 100 UNIT/ML IV SOLN
500.0000 [IU] | Freq: Once | INTRAVENOUS | Status: AC
  Administered 2023-01-21: 500 [IU] via INTRAVENOUS

## 2023-01-21 MED ORDER — IOHEXOL 300 MG/ML  SOLN
75.0000 mL | Freq: Once | INTRAMUSCULAR | Status: AC | PRN
  Administered 2023-01-21: 75 mL via INTRAVENOUS

## 2023-01-21 MED ORDER — HEPARIN SOD (PORK) LOCK FLUSH 100 UNIT/ML IV SOLN
INTRAVENOUS | Status: AC
  Filled 2023-01-21: qty 5

## 2023-01-22 ENCOUNTER — Telehealth: Payer: Self-pay | Admitting: *Deleted

## 2023-01-22 NOTE — Telephone Encounter (Signed)
LMTRC     Pt says she needs to have procedures 2 weeks after her chemo. She has chemo on 01/07/23 and on 01/29/23. Provider doesn't have anything before that time. We will call her once we get providers Oct schedule. Pt verbalized understanding.   Please arrange TCS with possible EGD with Dr. Levon Hedger. Dx: history of colon polyps, weight loss, early satiety, lower abdominal pain. She needs Trilyte with extra 1/2 bowel prep and 2 days of clears. No iron x7 days prior.

## 2023-01-29 ENCOUNTER — Inpatient Hospital Stay: Payer: Medicare Other | Admitting: Internal Medicine

## 2023-01-29 ENCOUNTER — Inpatient Hospital Stay: Payer: Medicare Other | Attending: Internal Medicine

## 2023-01-29 ENCOUNTER — Inpatient Hospital Stay: Payer: Medicare Other

## 2023-01-29 VITALS — BP 119/79 | HR 84 | Temp 98.9°F | Resp 16

## 2023-01-29 DIAGNOSIS — Z923 Personal history of irradiation: Secondary | ICD-10-CM | POA: Diagnosis not present

## 2023-01-29 DIAGNOSIS — Z5112 Encounter for antineoplastic immunotherapy: Secondary | ICD-10-CM | POA: Diagnosis present

## 2023-01-29 DIAGNOSIS — D649 Anemia, unspecified: Secondary | ICD-10-CM | POA: Insufficient documentation

## 2023-01-29 DIAGNOSIS — Z86718 Personal history of other venous thrombosis and embolism: Secondary | ICD-10-CM | POA: Diagnosis not present

## 2023-01-29 DIAGNOSIS — C3491 Malignant neoplasm of unspecified part of right bronchus or lung: Secondary | ICD-10-CM

## 2023-01-29 DIAGNOSIS — C778 Secondary and unspecified malignant neoplasm of lymph nodes of multiple regions: Secondary | ICD-10-CM | POA: Insufficient documentation

## 2023-01-29 DIAGNOSIS — T451X5A Adverse effect of antineoplastic and immunosuppressive drugs, initial encounter: Secondary | ICD-10-CM

## 2023-01-29 DIAGNOSIS — Z5111 Encounter for antineoplastic chemotherapy: Secondary | ICD-10-CM | POA: Insufficient documentation

## 2023-01-29 DIAGNOSIS — C3411 Malignant neoplasm of upper lobe, right bronchus or lung: Secondary | ICD-10-CM | POA: Diagnosis present

## 2023-01-29 LAB — CMP (CANCER CENTER ONLY)
ALT: 11 U/L (ref 0–44)
AST: 22 U/L (ref 15–41)
Albumin: 3.3 g/dL — ABNORMAL LOW (ref 3.5–5.0)
Alkaline Phosphatase: 46 U/L (ref 38–126)
Anion gap: 5 (ref 5–15)
BUN: 13 mg/dL (ref 8–23)
CO2: 29 mmol/L (ref 22–32)
Calcium: 9.6 mg/dL (ref 8.9–10.3)
Chloride: 106 mmol/L (ref 98–111)
Creatinine: 1.03 mg/dL — ABNORMAL HIGH (ref 0.44–1.00)
GFR, Estimated: >60 mL/min (ref >60)
Glucose, Bld: 89 mg/dL (ref 70–99)
Potassium: 3.7 mmol/L (ref 3.5–5.1)
Sodium: 140 mmol/L (ref 135–145)
Total Bilirubin: 0.3 mg/dL (ref 0.3–1.2)
Total Protein: 6.5 g/dL (ref 6.5–8.1)

## 2023-01-29 LAB — CBC WITH DIFFERENTIAL (CANCER CENTER ONLY)
Abs Immature Granulocytes: 0.03 10*3/uL (ref 0.00–0.07)
Basophils Absolute: 0.1 10*3/uL (ref 0.0–0.1)
Basophils Relative: 1 %
Eosinophils Absolute: 0.1 10*3/uL (ref 0.0–0.5)
Eosinophils Relative: 2 %
HCT: 29.8 % — ABNORMAL LOW (ref 36.0–46.0)
Hemoglobin: 9.9 g/dL — ABNORMAL LOW (ref 12.0–15.0)
Immature Granulocytes: 1 %
Lymphocytes Relative: 27 %
Lymphs Abs: 1.7 10*3/uL (ref 0.7–4.0)
MCH: 37.9 pg — ABNORMAL HIGH (ref 26.0–34.0)
MCHC: 33.2 g/dL (ref 30.0–36.0)
MCV: 114.2 fL — ABNORMAL HIGH (ref 80.0–100.0)
Monocytes Absolute: 1 10*3/uL (ref 0.1–1.0)
Monocytes Relative: 15 %
Neutro Abs: 3.4 10*3/uL (ref 1.7–7.7)
Neutrophils Relative %: 54 %
Platelet Count: 243 10*3/uL (ref 150–400)
RBC: 2.61 MIL/uL — ABNORMAL LOW (ref 3.87–5.11)
RDW: 18 % — ABNORMAL HIGH (ref 11.5–15.5)
WBC Count: 6.2 10*3/uL (ref 4.0–10.5)
nRBC: 0 % (ref 0.0–0.2)

## 2023-01-29 MED ORDER — SODIUM CHLORIDE 0.9 % IV SOLN
400.0000 mg/m2 | Freq: Once | INTRAVENOUS | Status: AC
  Administered 2023-01-29: 700 mg via INTRAVENOUS
  Filled 2023-01-29: qty 20

## 2023-01-29 MED ORDER — SODIUM CHLORIDE 0.9% FLUSH
10.0000 mL | Freq: Once | INTRAVENOUS | Status: AC
  Administered 2023-01-29: 10 mL

## 2023-01-29 MED ORDER — SODIUM CHLORIDE 0.9 % IV SOLN: Freq: Once | INTRAVENOUS | Status: AC

## 2023-01-29 MED ORDER — HEPARIN SOD (PORK) LOCK FLUSH 100 UNIT/ML IV SOLN
500.0000 [IU] | Freq: Once | INTRAVENOUS | Status: AC | PRN
  Administered 2023-01-29: 500 [IU]

## 2023-01-29 MED ORDER — SODIUM CHLORIDE 0.9% FLUSH
10.0000 mL | INTRAVENOUS | Status: DC | PRN
  Administered 2023-01-29: 10 mL

## 2023-01-29 MED ORDER — SODIUM CHLORIDE 0.9 % IV SOLN
200.0000 mg | Freq: Once | INTRAVENOUS | Status: AC
  Administered 2023-01-29: 200 mg via INTRAVENOUS
  Filled 2023-01-29: qty 200

## 2023-01-29 NOTE — Patient Instructions (Signed)
[facility]  Discharge Instructions: Thank you for choosing [facility] to provide your oncology and hematology care.   If you have a lab appointment with the Cancer Center, please go directly to the Cancer Center and check in at the registration area.   Wear comfortable clothing and clothing appropriate for easy access to any Portacath or PICC line.   We strive to give you quality time with your provider. You may need to reschedule your appointment if you arrive late (15 or more minutes).  Arriving late affects you and other patients whose appointments are after yours.  Also, if you miss three or more appointments without notifying the office, you may be dismissed from the clinic at the provider's discretion.      For prescription refill requests, have your pharmacy contact our office and allow 72 hours for refills to be completed.    Today you received the following chemotherapy and/or immunotherapy agents: Keytruda/Alimta      To help prevent nausea and vomiting after your treatment, we encourage you to take your nausea medication as directed.  BELOW ARE SYMPTOMS THAT SHOULD BE REPORTED IMMEDIATELY: *FEVER GREATER THAN 100.4 F (38 C) OR HIGHER *CHILLS OR SWEATING *NAUSEA AND VOMITING THAT IS NOT CONTROLLED WITH YOUR NAUSEA MEDICATION *UNUSUAL SHORTNESS OF BREATH *UNUSUAL BRUISING OR BLEEDING *URINARY PROBLEMS (pain or burning when urinating, or frequent urination) *BOWEL PROBLEMS (unusual diarrhea, constipation, pain near the anus) TENDERNESS IN MOUTH AND THROAT WITH OR WITHOUT PRESENCE OF ULCERS (sore throat, sores in mouth, or a toothache) UNUSUAL RASH, SWELLING OR PAIN  UNUSUAL VAGINAL DISCHARGE OR ITCHING   Items with * indicate a potential emergency and should be followed up as soon as possible or go to the Emergency Department if any problems should occur.  Please show the CHEMOTHERAPY ALERT CARD or IMMUNOTHERAPY ALERT CARD at  check-in to the Emergency Department and triage nurse.  Should you have questions after your visit or need to cancel or reschedule your appointment, please contact [facility]  Dept: [phone]  and follow the prompts.  Office hours are 8:00 a.m. to 4:30 p.m. Monday - Friday. Please note that voicemails left after 4:00 p.m. may not be returned until the following business day.  We are closed weekends and major holidays. You have access to a nurse at all times for urgent questions. Please call the main number to the clinic Dept: [phone] and follow the prompts.   For any non-urgent questions, you may also contact your provider using MyChart. We now offer e-Visits for anyone 18 and older to request care online for non-urgent symptoms. For details visit [email or web address].   Also download the MyChart app! Go to the app store, search "MyChart", open the app, select , and log in with your MyChart username and password.   
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
## 2023-01-29 NOTE — Progress Notes (Signed)
Patient took her own compazine.

## 2023-02-03 ENCOUNTER — Telehealth: Payer: Self-pay | Admitting: Pulmonary Disease

## 2023-02-03 NOTE — Telephone Encounter (Signed)
I called and spoke with the pt  She is c/o occ DOE  She is not using her breztri as directed and only uses occ as needed  I advised this was prescribed to be used 2 puffs bid  She verbalized understanding and will try taking as directed  I have scheduled her for the next available 03/30/23  Advised to seek emergent care sooner if needed

## 2023-02-03 NOTE — Telephone Encounter (Signed)
Patient states having symptom of cough. Patient states has lung cancer. Would like to know if she can take Guaisenesin extended release. Patient phone number is [phone].

## 2023-02-04 ENCOUNTER — Other Ambulatory Visit: Payer: Self-pay

## 2023-02-04 ENCOUNTER — Telehealth: Payer: Self-pay | Admitting: Medical Oncology

## 2023-02-04 ENCOUNTER — Emergency Department (HOSPITAL_COMMUNITY): Payer: Medicare Other

## 2023-02-04 ENCOUNTER — Encounter (HOSPITAL_COMMUNITY): Payer: Self-pay

## 2023-02-04 ENCOUNTER — Inpatient Hospital Stay (HOSPITAL_COMMUNITY)
Admission: EM | Admit: 2023-02-04 | Discharge: 2023-02-06 | DRG: 602 | Disposition: A | Discharge status: Home / Self Care | Payer: Medicare Other | Attending: Family Medicine | Admitting: Family Medicine

## 2023-02-04 DIAGNOSIS — T451X5A Adverse effect of antineoplastic and immunosuppressive drugs, initial encounter: Secondary | ICD-10-CM | POA: Diagnosis present

## 2023-02-04 DIAGNOSIS — I959 Hypotension, unspecified: Secondary | ICD-10-CM | POA: Diagnosis present

## 2023-02-04 DIAGNOSIS — K219 Gastro-esophageal reflux disease without esophagitis: Secondary | ICD-10-CM | POA: Diagnosis present

## 2023-02-04 DIAGNOSIS — J439 Emphysema, unspecified: Secondary | ICD-10-CM | POA: Diagnosis present

## 2023-02-04 DIAGNOSIS — E7800 Pure hypercholesterolemia, unspecified: Secondary | ICD-10-CM | POA: Diagnosis present

## 2023-02-04 DIAGNOSIS — Z882 Allergy status to sulfonamides status: Secondary | ICD-10-CM

## 2023-02-04 DIAGNOSIS — I5020 Unspecified systolic (congestive) heart failure: Secondary | ICD-10-CM | POA: Diagnosis present

## 2023-02-04 DIAGNOSIS — I5022 Chronic systolic (congestive) heart failure: Secondary | ICD-10-CM | POA: Diagnosis present

## 2023-02-04 DIAGNOSIS — Z87891 Personal history of nicotine dependence: Secondary | ICD-10-CM | POA: Diagnosis not present

## 2023-02-04 DIAGNOSIS — I9589 Other hypotension: Secondary | ICD-10-CM | POA: Diagnosis present

## 2023-02-04 DIAGNOSIS — D61810 Antineoplastic chemotherapy induced pancytopenia: Secondary | ICD-10-CM | POA: Diagnosis present

## 2023-02-04 DIAGNOSIS — Z888 Allergy status to other drugs, medicaments and biological substances status: Secondary | ICD-10-CM

## 2023-02-04 DIAGNOSIS — L03115 Cellulitis of right lower limb: Principal | ICD-10-CM | POA: Diagnosis present

## 2023-02-04 DIAGNOSIS — Z79811 Long term (current) use of aromatase inhibitors: Secondary | ICD-10-CM

## 2023-02-04 DIAGNOSIS — Z923 Personal history of irradiation: Secondary | ICD-10-CM | POA: Diagnosis not present

## 2023-02-04 DIAGNOSIS — J449 Chronic obstructive pulmonary disease, unspecified: Secondary | ICD-10-CM | POA: Diagnosis present

## 2023-02-04 DIAGNOSIS — A419 Sepsis, unspecified organism: Principal | ICD-10-CM

## 2023-02-04 DIAGNOSIS — Z7969 Long term (current) use of other immunomodulators and immunosuppressants: Secondary | ICD-10-CM | POA: Diagnosis not present

## 2023-02-04 DIAGNOSIS — D61818 Other pancytopenia: Secondary | ICD-10-CM | POA: Diagnosis present

## 2023-02-04 DIAGNOSIS — Z90710 Acquired absence of both cervix and uterus: Secondary | ICD-10-CM

## 2023-02-04 DIAGNOSIS — D84821 Immunodeficiency due to drugs: Secondary | ICD-10-CM | POA: Diagnosis present

## 2023-02-04 DIAGNOSIS — I110 Hypertensive heart disease with heart failure: Secondary | ICD-10-CM | POA: Diagnosis present

## 2023-02-04 DIAGNOSIS — C3491 Malignant neoplasm of unspecified part of right bronchus or lung: Secondary | ICD-10-CM | POA: Diagnosis present

## 2023-02-04 DIAGNOSIS — Z8249 Family history of ischemic heart disease and other diseases of the circulatory system: Secondary | ICD-10-CM

## 2023-02-04 DIAGNOSIS — Z90722 Acquired absence of ovaries, bilateral: Secondary | ICD-10-CM

## 2023-02-04 DIAGNOSIS — Z7951 Long term (current) use of inhaled steroids: Secondary | ICD-10-CM | POA: Diagnosis not present

## 2023-02-04 DIAGNOSIS — C779 Secondary and unspecified malignant neoplasm of lymph node, unspecified: Secondary | ICD-10-CM | POA: Diagnosis present

## 2023-02-04 DIAGNOSIS — Z885 Allergy status to narcotic agent status: Secondary | ICD-10-CM | POA: Diagnosis not present

## 2023-02-04 DIAGNOSIS — Z8051 Family history of malignant neoplasm of kidney: Secondary | ICD-10-CM

## 2023-02-04 DIAGNOSIS — I5032 Chronic diastolic (congestive) heart failure: Secondary | ICD-10-CM | POA: Diagnosis present

## 2023-02-04 DIAGNOSIS — Z79899 Other long term (current) drug therapy: Secondary | ICD-10-CM

## 2023-02-04 DIAGNOSIS — Z825 Family history of asthma and other chronic lower respiratory diseases: Secondary | ICD-10-CM

## 2023-02-04 DIAGNOSIS — Z9079 Acquired absence of other genital organ(s): Secondary | ICD-10-CM

## 2023-02-04 LAB — URINALYSIS, ROUTINE W REFLEX MICROSCOPIC
Bilirubin Urine: NEGATIVE
Glucose, UA: NEGATIVE mg/dL
Hgb urine dipstick: NEGATIVE
Ketones, ur: 5 mg/dL — AB
Leukocytes,Ua: NEGATIVE
Nitrite: NEGATIVE
Protein, ur: 100 mg/dL — AB
Specific Gravity, Urine: 1.023 (ref 1.005–1.030)
pH: 5 (ref 5.0–8.0)

## 2023-02-04 LAB — COMPREHENSIVE METABOLIC PANEL
ALT: 17 U/L (ref 0–44)
AST: 26 U/L (ref 15–41)
Albumin: 2.9 g/dL — ABNORMAL LOW (ref 3.5–5.0)
Alkaline Phosphatase: 47 U/L (ref 38–126)
Anion gap: 9 (ref 5–15)
BUN: 22 mg/dL (ref 8–23)
CO2: 27 mmol/L (ref 22–32)
Calcium: 10 mg/dL (ref 8.9–10.3)
Chloride: 103 mmol/L (ref 98–111)
Creatinine, Ser: 1.18 mg/dL — ABNORMAL HIGH (ref 0.44–1.00)
GFR, Estimated: 51 mL/min — ABNORMAL LOW (ref >60)
Glucose, Bld: 100 mg/dL — ABNORMAL HIGH (ref 70–99)
Potassium: 4.1 mmol/L (ref 3.5–5.1)
Sodium: 139 mmol/L (ref 135–145)
Total Bilirubin: 0.5 mg/dL (ref 0.3–1.2)
Total Protein: 6.8 g/dL (ref 6.5–8.1)

## 2023-02-04 LAB — PROTIME-INR
INR: 1 (ref 0.8–1.2)
Prothrombin Time: 13.6 s (ref 11.4–15.2)

## 2023-02-04 LAB — CBC
HCT: 28.7 % — ABNORMAL LOW (ref 36.0–46.0)
Hemoglobin: 9.4 g/dL — ABNORMAL LOW (ref 12.0–15.0)
MCH: 38.1 pg — ABNORMAL HIGH (ref 26.0–34.0)
MCHC: 32.8 g/dL (ref 30.0–36.0)
MCV: 116.2 fL — ABNORMAL HIGH (ref 80.0–100.0)
Platelets: 143 10*3/uL — ABNORMAL LOW (ref 150–400)
RBC: 2.47 MIL/uL — ABNORMAL LOW (ref 3.87–5.11)
RDW: 16.6 % — ABNORMAL HIGH (ref 11.5–15.5)
WBC: 5.1 10*3/uL (ref 4.0–10.5)
nRBC: 0 % (ref 0.0–0.2)

## 2023-02-04 LAB — LACTIC ACID, PLASMA
Lactic Acid, Venous: 1.4 mmol/L (ref 0.5–1.9)
Lactic Acid, Venous: 1 mmol/L (ref 0.5–1.9)

## 2023-02-04 LAB — HIV ANTIBODY (ROUTINE TESTING W REFLEX): HIV Screen 4th Generation wRfx: NONREACTIVE

## 2023-02-04 MED ORDER — PROCHLORPERAZINE MALEATE 5 MG PO TABS: 10.0000 mg | ORAL_TABLET | Freq: Four times a day (QID) | ORAL | Status: DC | PRN

## 2023-02-04 MED ORDER — ENOXAPARIN SODIUM 40 MG/0.4ML IJ SOSY
40.0000 mg | PREFILLED_SYRINGE | INTRAMUSCULAR | Status: DC
  Administered 2023-02-05: 40 mg via SUBCUTANEOUS
  Filled 2023-02-04: qty 0.4

## 2023-02-04 MED ORDER — HYDROMORPHONE HCL 1 MG/ML IJ SOLN: 0.5000 mg | INTRAMUSCULAR | Status: DC | PRN

## 2023-02-04 MED ORDER — SODIUM CHLORIDE 0.9 % IV SOLN
2.0000 g | Freq: Once | INTRAVENOUS | Status: AC
  Administered 2023-02-04: 2 g via INTRAVENOUS
  Filled 2023-02-04: qty 20

## 2023-02-04 MED ORDER — ACETAMINOPHEN 650 MG RE SUPP: 650.0000 mg | Freq: Four times a day (QID) | RECTAL | Status: DC | PRN

## 2023-02-04 MED ORDER — OXYCODONE-ACETAMINOPHEN 5-325 MG PO TABS
1.0000 | ORAL_TABLET | Freq: Four times a day (QID) | ORAL | Status: DC | PRN
  Administered 2023-02-04: 1 via ORAL
  Filled 2023-02-04: qty 1

## 2023-02-04 MED ORDER — OXYCODONE-ACETAMINOPHEN 5-325 MG PO TABS
1.0000 | ORAL_TABLET | Freq: Four times a day (QID) | ORAL | Status: DC | PRN
  Administered 2023-02-04: 2 via ORAL
  Administered 2023-02-05 (×2): 1 via ORAL
  Filled 2023-02-04: qty 2
  Filled 2023-02-04 (×2): qty 1

## 2023-02-04 MED ORDER — POLYETHYLENE GLYCOL 3350 17 G PO PACK: 17.0000 g | PACK | Freq: Every day | ORAL | Status: DC | PRN

## 2023-02-04 MED ORDER — MOMETASONE FURO-FORMOTEROL FUM 100-5 MCG/ACT IN AERO
2.0000 | INHALATION_SPRAY | Freq: Two times a day (BID) | RESPIRATORY_TRACT | Status: DC
  Filled 2023-02-04: qty 8.8

## 2023-02-04 MED ORDER — ACETAMINOPHEN 325 MG PO TABS: 650.0000 mg | ORAL_TABLET | Freq: Four times a day (QID) | ORAL | Status: DC | PRN

## 2023-02-04 MED ORDER — SODIUM CHLORIDE 0.9 % IV BOLUS
1500.0000 mL | Freq: Once | INTRAVENOUS | Status: AC
  Administered 2023-02-04: 1500 mL via INTRAVENOUS

## 2023-02-04 MED ORDER — VANCOMYCIN HCL 750 MG/150ML IV SOLN: 750.0000 mg | INTRAVENOUS | Status: DC

## 2023-02-04 MED ORDER — MORPHINE SULFATE (PF) 4 MG/ML IV SOLN: 4.0000 mg | Freq: Once | INTRAVENOUS | Status: DC

## 2023-02-04 MED ORDER — UMECLIDINIUM BROMIDE 62.5 MCG/ACT IN AEPB
1.0000 | INHALATION_SPRAY | Freq: Every day | RESPIRATORY_TRACT | Status: DC
  Filled 2023-02-04: qty 7

## 2023-02-04 MED ORDER — MIDODRINE HCL 5 MG PO TABS
5.0000 mg | ORAL_TABLET | Freq: Three times a day (TID) | ORAL | Status: DC
  Administered 2023-02-05: 5 mg via ORAL
  Filled 2023-02-04: qty 1

## 2023-02-04 MED ORDER — SODIUM CHLORIDE 0.9 % IV SOLN
2.0000 g | INTRAVENOUS | Status: DC
  Administered 2023-02-04 – 2023-02-05 (×2): 2 g via INTRAVENOUS
  Filled 2023-02-04 (×2): qty 20

## 2023-02-04 MED ORDER — VANCOMYCIN HCL 1500 MG/300ML IV SOLN
1500.0000 mg | Freq: Once | INTRAVENOUS | Status: AC
  Administered 2023-02-04: 1500 mg via INTRAVENOUS
  Filled 2023-02-04: qty 300

## 2023-02-04 MED ORDER — BUDESON-GLYCOPYRROL-FORMOTEROL 160-9-4.8 MCG/ACT IN AERO: 2.0000 | INHALATION_SPRAY | Freq: Two times a day (BID) | RESPIRATORY_TRACT | Status: DC

## 2023-02-04 NOTE — Telephone Encounter (Signed)
low grade fevers for several days and highest 100.9 with shaking chills, episodes of eye pain. . Last chemo/immuno  6 days ago'  Episodes of Chills, night sweats and " Hot" where she cannot wear clothes except underwear.  Right leg  pain ,swelling from ankle to below knee". "discoloration" - Vascular saw her in sept.  She is haivng to use a cane now for pain and weakness in R leg. She is taking Tylenol regularly for the R leg pain . I instructed her to hold off on tylenol until evaluated in ED.  VS- temp 100.3  now.  Oxygen Sat- 86% with exertion  Pulse @114 -120  BP 91/68, 117/77- last midrodine Monday, Poor appetite -vomiting x 1  reported.  Per Dr Arbutus Ped , I instructed pt to go to nearest ED now.-[place].

## 2023-02-04 NOTE — ED Notes (Signed)
Pt attempted to get up to go to the restroom and had a "shooting" pain. MD notified.

## 2023-02-04 NOTE — Assessment & Plan Note (Signed)
Stable and compensated.  Last echo 06/2022 EF 45%.

## 2023-02-04 NOTE — ED Provider Notes (Signed)
Procedure Name: Intubation Date/Time: 02/04/2023 8:19 PM  Performed by: Franne Forts, DOPre-anesthesia Checklist: Patient identified, Patient being monitored, Emergency Drugs available, Timeout performed and Suction available Preoxygenation: Pre-oxygenation with 100% oxygen Induction Type: Rapid sequence Tube size: 7.5 mm Number of attempts: 1 Placement Confirmation: ETT inserted through vocal cords under direct vision, CO2 detector, Breath sounds checked- equal and bilateral and Positive ETCO2 Tube secured with: ETT holder Comments: Patient on bipap prior to intubation         Franne Forts, DO 02/04/23 2021

## 2023-02-04 NOTE — Assessment & Plan Note (Signed)
Stage IV non-small cell lung cancer, with widespread metastatic adenopathy, diagnosed 2023.  S/p radiation, currently on Alimta and Keytruda every 3 weeks.  Follows with Dr. Shirline Frees.

## 2023-02-04 NOTE — ED Triage Notes (Signed)
Pt c/o RLE swelling and pain x"a while," weakness x6 days following radiation treatment and fever and brief eye pain starting last night.  Currently, denies pain.  Hx of lung CA.     Highest fever 100.9.    Pt was seen by Vascular and had negative DVT study in late September.

## 2023-02-04 NOTE — ED Notes (Signed)
Informed pt that she could have another percocet at 2330. Pt lying on left side, reports she will try and take nap for couple of hours. Pt spouse at bedside.

## 2023-02-04 NOTE — ED Notes (Signed)
Pt was wheeled chaired to the restroom and back. She is resting in the bed with call light in reach.

## 2023-02-04 NOTE — Progress Notes (Signed)
Pharmacy Antibiotic Note  Yvette Curry is a 67 y.o. female admitted on 02/04/2023 with sepsis and cellulitis.  Pharmacy has been consulted for vancomycin dosing.  Plan: Vancomycin 1500 mg IV x 1 dose. Vancomycin 750 mg IV every 24 hours. Ceftriaxone per MD. Monitor labs, c/s, and vanco levels as indicated.  Height: 5\' 1"  (154.9 cm) Weight: 64 kg (141 lb) IBW/kg (Calculated) : 47.8  Temp (24hrs), Avg:99 F (37.2 C), Min:98.7 F (37.1 C), Max:99.2 F (37.3 C)  Recent Labs  Lab 01/29/23 0837 02/04/23 1303  WBC 6.2 5.1  CREATININE 1.03* 1.18*  LATICACIDVEN  --  1.4    Estimated Creatinine Clearance: 40.7 mL/min (A) (by C-G formula based on SCr of 1.18 mg/dL (H)).    Allergies  Allergen Reactions   Lidocaine Shortness Of Breath and Anxiety    Patient felt like she "couldn't breathe", panicky Allergic to all " caines"   Mepivacaine Swelling    angioedema   Demerol Nausea And Vomiting   Prednisone Hives and Nausea And Vomiting    abd pain and vomiting, Hives    Sulfa Antibiotics Hives    Hives, swelling and itching    Antimicrobials this admission: Vanco 10/16 >> CTX 10/16  Microbiology results: 10/16 BCx: pending  Thank you for allowing pharmacy to be a part of this patient's care.  Judeth Cornfield, PharmD Clinical Pharmacist 02/04/2023 4:44 PM

## 2023-02-04 NOTE — ED Provider Notes (Signed)
[facility] Provider Note   CSN: [id number] Arrival date & time: 02/04/23  1142     History  Chief Complaint  Patient presents with   CA Pt   Weakness   Fever    Yvette Curry is a 67 y.o. female.  Patient is a 67 year old female with past medical history of stage IV (T1b, N3, M1b) non-small cell lung cancer, adenocarcinoma currently on systemic chemotherapy carboplatin and Keytruda presenting for complaints of fever.  Patient admits to a fever that began yesterday with a Tmax of 100.9 F orally.  Patient admits to myalgias, nausea, and decreased appetite.  On arrival her pulse is 115.  She admits to dark urine but denies any increased frequency, urgency, dysuria, or pelvic pain.  She denies any vomiting.  Denies any diarrhea.  She does admit to chronic right lower extremity swelling with workup for DVT this past September 2024 that was negative.  She does admit to new redness and worsening pain over the last 3 days in the right lower extremity.  Patient's oncologist is Dr. Arlis Porta. Patient's vascular surgeon Dr. Durene Cal.  The history is provided by the patient. No language interpreter was used.  Weakness Associated symptoms: fever and vomiting   Associated symptoms: no abdominal pain, no arthralgias, no chest pain, no cough, no dysuria, no seizures and no shortness of breath   Fever Associated symptoms: vomiting   Associated symptoms: no chest pain, no chills, no cough, no dysuria, no ear pain, no rash and no sore throat        Home Medications Prior to Admission medications   Medication Sig Start Date End Date Taking? Authorizing Provider  acetaminophen (TYLENOL) 325 MG tablet Take 2 tablets (650 mg total) by mouth every 6 (six) hours as needed for mild pain (or Fever >/= 101). 10/21/22  Yes Emokpae, Courage, MD  ascorbic acid (VITAMIN C) 500 MG tablet Take by mouth. 03/14/19  Yes [provider]   bevacizumab (AVASTIN) 1.25 mg/0.1 mL SOLN 1.25 mg by Intravitreal route to Surgery. Into right eye for swelling behind retina   Yes [provider]  Budeson-Glycopyrrol-Formoterol (BREZTRI AEROSPHERE) 160-9-4.8 MCG/ACT AERO Inhale 2 puffs into the lungs in the morning and at bedtime. 12/18/22  Yes Icard, Bradley L, DO  carboxymethylcellulose 1 % ophthalmic solution Apply 1 drop to eye 3 (three) times daily.   Yes [provider]  ferrous sulfate 324 MG TBEC Take 1 tablet (324 mg total) by mouth daily with breakfast. 10/21/22  Yes Emokpae, Courage, MD  folic acid (FOLVITE) 1 MG tablet Take 1 tablet (1 mg total) by mouth daily. 10/21/22  Yes Emokpae, Courage, MD  midodrine (PROAMATINE) 5 MG tablet Take 1 tablet (5 mg total) by mouth 3 (three) times daily with meals. Patient taking differently: Take 5 mg by mouth 3 (three) times daily with meals. Only when the top number drops below 90 01/14/23  Yes Sharlene Dory, NP  prochlorperazine (COMPAZINE) 10 MG tablet Take 10 mg by mouth every 6 (six) hours as needed for nausea or vomiting.   Yes [provider]  Blood Pressure Monitoring (OMRON 3 SERIES BP MONITOR) DEVI Use as directed 12/01/22   Sharlene Dory, NP  Spacer/Aero-Holding Chambers (AEROCHAMBER MV) inhaler Use as instructed 12/18/22   Josephine Igo, DO      Allergies    Lidocaine, Mepivacaine, Demerol, Prednisone, and Sulfa antibiotics    Review of Systems   Review of Systems  Constitutional:  Positive for appetite change and fever. Negative for chills.  HENT:  Negative for ear pain and sore throat.   Eyes:  Negative for pain and visual disturbance.  Respiratory:  Negative for cough and shortness of breath.   Cardiovascular:  Negative for chest pain and palpitations.  Gastrointestinal:  Positive for vomiting. Negative for abdominal pain.  Genitourinary:  Negative for dysuria and hematuria.  Musculoskeletal:  Negative for arthralgias and back pain.  Skin:   Negative for color change and rash.  Neurological:  Positive for weakness. Negative for seizures and syncope.  All other systems reviewed and are negative.   Physical Exam Updated Vital Signs BP 99/74 (BP Location: Left Arm)   Pulse (!) 115   Temp 99.2 F (37.3 C) (Oral)   Ht 5\' 1"  (1.549 m)   Wt 64 kg   SpO2 95%   BMI 26.64 kg/m  Physical Exam Vitals and nursing note reviewed.  Constitutional:      General: She is not in acute distress.    Appearance: She is well-developed.  HENT:     Head: Normocephalic and atraumatic.  Eyes:     Conjunctiva/sclera: Conjunctivae normal.  Cardiovascular:     Rate and Rhythm: Normal rate and regular rhythm.     Heart sounds: No murmur heard. Pulmonary:     Effort: Pulmonary effort is normal. No respiratory distress.     Breath sounds: Normal breath sounds.  Abdominal:     Palpations: Abdomen is soft.     Tenderness: There is no abdominal tenderness.  Musculoskeletal:        General: No swelling.     Cervical back: Neck supple.  Skin:    General: Skin is warm and dry.     Capillary Refill: Capillary refill takes less than 2 seconds.     Findings: Rash present.       Neurological:     Mental Status: She is alert.  Psychiatric:        Mood and Affect: Mood normal.     ED Results / Procedures / Treatments   Labs (all labs ordered are listed, but only abnormal results are displayed) Labs Reviewed  CBC - Abnormal; Notable for the following components:      Result Value   RBC 2.47 (*)    Hemoglobin 9.4 (*)    HCT 28.7 (*)    MCV 116.2 (*)    MCH 38.1 (*)    RDW 16.6 (*)    Platelets 143 (*)    All other components within normal limits  URINALYSIS, ROUTINE W REFLEX MICROSCOPIC - Abnormal; Notable for the following components:   APPearance HAZY (*)    Ketones, ur 5 (*)    Protein, ur 100 (*)    Bacteria, UA RARE (*)    All other components within normal limits  COMPREHENSIVE METABOLIC PANEL - Abnormal; Notable for the  following components:   Glucose, Bld 100 (*)    Creatinine, Ser 1.18 (*)    Albumin 2.9 (*)    GFR, Estimated 51 (*)    All other components within normal limits  CULTURE, BLOOD (ROUTINE X 2)  CULTURE, BLOOD (ROUTINE X 2)  LACTIC ACID, PLASMA  LACTIC ACID, PLASMA  PROTIME-INR  HIV ANTIBODY (ROUTINE TESTING W REFLEX)    EKG None  Radiology DG Tibia/Fibula Right  Result Date: 02/04/2023 CLINICAL DATA:  Cellulitis.  Evaluate for bony involvement. EXAM: RIGHT TIBIA AND FIBULA - 2 VIEW COMPARISON:  None Available. FINDINGS:  No fracture or dislocation. Limited visualization of the adjacent knee and ankle is normal given obliquity and large field of view. Regional soft tissues appear normal. No subcutaneous emphysema. No radiopaque foreign body. No discrete areas of osteolysis to suggest osteomyelitis. IMPRESSION: No radiographic evidence of osteomyelitis. Electronically Signed   By: Simonne Come M.D.   On: 02/04/2023 16:59   US Venous Img Lower Unilateral Right  Result Date: 02/04/2023 CLINICAL DATA:  Right lower extremity pain and edema. Evaluate for DVT. EXAM: RIGHT LOWER EXTREMITY VENOUS DOPPLER ULTRASOUND TECHNIQUE: Johnaton Sonneborn-scale sonography with graded compression, as well as color Doppler and duplex ultrasound were performed to evaluate the lower extremity deep venous systems from the level of the common femoral vein and including the common femoral, femoral, profunda femoral, popliteal and calf veins including the posterior tibial, peroneal and gastrocnemius veins when visible. The superficial great saphenous vein was also interrogated. Spectral Doppler was utilized to evaluate flow at rest and with distal augmentation maneuvers in the common femoral, femoral and popliteal veins. COMPARISON:  None Available. FINDINGS: Contralateral Common Femoral Vein: Respiratory phasicity is normal and symmetric with the symptomatic side. No evidence of thrombus. Normal compressibility. Common Femoral Vein:  No evidence of thrombus. Normal compressibility, respiratory phasicity and response to augmentation. Saphenofemoral Junction: No evidence of thrombus. Normal compressibility and flow on color Doppler imaging. Profunda Femoral Vein: No evidence of thrombus. Normal compressibility and flow on color Doppler imaging. Femoral Vein: No evidence of thrombus. Normal compressibility, respiratory phasicity and response to augmentation. Popliteal Vein: No evidence of thrombus. Normal compressibility, respiratory phasicity and response to augmentation. Calf Veins: No evidence of thrombus. Normal compressibility and flow on color Doppler imaging. Superficial Great Saphenous Vein: No evidence of thrombus. Normal compressibility. Other Findings:  None. IMPRESSION: No evidence of DVT within the right lower extremity. Electronically Signed   By: Simonne Come M.D.   On: 02/04/2023 16:59   DG Chest Port 1 View  Result Date: 02/04/2023 CLINICAL DATA:  Sepsis. Right lower extremity swelling and pain. Weakness. EXAM: PORTABLE CHEST 1 VIEW COMPARISON:  Chest radiograph 10/20/2022 and CT 01/21/2023 FINDINGS: A right jugular Port-A-Cath terminates over the mid SVC. The cardiomediastinal silhouette is unchanged with normal heart size. Focal density in the right upper lobe corresponds to the spiculated nodule on CT. No acute airspace consolidation, edema, pleural effusion, or pneumothorax is identified. No acute osseous abnormality is seen. IMPRESSION: No evidence of acute airspace disease. Known right upper lobe nodule. Electronically Signed   By: Sebastian Ache M.D.   On: 02/04/2023 14:45    Procedures .Critical Care  Performed by: Franne Forts, DO Authorized by: Franne Forts, DO   Critical care provider statement:    Critical care time (minutes):  30   Critical care was necessary to treat or prevent imminent or life-threatening deterioration of the following conditions:  Sepsis   Critical care was time spent personally by  me on the following activities:  Development of treatment plan with patient or surrogate, discussions with consultants, evaluation of patient's response to treatment, examination of patient, ordering and review of laboratory studies, ordering and review of radiographic studies, ordering and performing treatments and interventions, pulse oximetry, re-evaluation of patient's condition and review of old charts   Care discussed with: admitting provider       Medications Ordered in ED Medications  sodium chloride 0.9 % bolus 1,500 mL (has no administration in time range)  morphine (PF) 4 MG/ML injection 4 mg (has no administration in  time range)  vancomycin (VANCOREADY) IVPB 1500 mg/300 mL (1,500 mg Intravenous New Bag/Given 02/04/23 1718)  vancomycin (VANCOREADY) IVPB 750 mg/150 mL (has no administration in time range)  cefTRIAXone (ROCEPHIN) 2 g in sodium chloride 0.9 % 100 mL IVPB (2 g Intravenous New Bag/Given 02/04/23 1628)    ED Course/ Medical Decision Making/ A&P                                 Medical Decision Making Amount and/or Complexity of Data Reviewed Labs: ordered. Radiology: ordered.  Risk Prescription drug management. Decision regarding hospitalization.    67 year old female with past medical history of stage IV (T1b, N3, M1b) non-small cell lung cancer, adenocarcinoma currently on systemic chemotherapy carboplatin and Keytruda presenting for complaints of fever.  Patient is alert and oriented x 3, no acute distress, afebrile currently at 98.7 F with a pulse of 115.  Physical exam concerning for cellulitis of the right lower extremity due to erythema, swelling, pain, and warmth.  Ultrasound will be obtained to rule out DVT however given patient's fever and current myalgias and nausea will favor sepsis secondary to cellulitis.  Lactic acid and blood cultures pending. IV fluids initiated. Rocephin and vanc started.   [place] confirms no DVT. No bony involvement on  xray.  Patient recommended for admission for sepsis secondary to cellulitis.  Blood cultures pending.  SIRS criteria include tachycardia 115 and home fevers.  Took antipyretics at home.  I spoke with hospitalist team who agrees to accept patient.        Final Clinical Impression(s) / ED Diagnoses Final diagnoses:  Sepsis, due to unspecified organism, unspecified whether acute organ dysfunction present [facility])  Cellulitis of right lower extremity    Rx / DC Orders ED Discharge Orders     None         Franne Forts, DO 02/04/23 1718

## 2023-02-04 NOTE — Assessment & Plan Note (Addendum)
Cellulitis right lower extremity, pain and tenderness of 1 day duration. Rules out for sepsis.  Tachycardic to 115, Tmax 99.2.  WBC 5.1.  Lactic acid 1.4.  Right lower extremity venous Dopplers negative for DVT today.  Open wounds or ulcers.  Immunocompromised on chemotherapy. -Will continue with IV ceftriaxone 2 g daily -Follow-up blood cultures -As needed Percocet

## 2023-02-04 NOTE — Assessment & Plan Note (Signed)
Stable.  Follows with pulmonologist Dr. Tonia Brooms -Resume home regimen

## 2023-02-04 NOTE — Assessment & Plan Note (Signed)
Chronic hypotension, blood pressure 99/74 here, on midodrine at home for systolic blood pressure less than 20. -Resume midodrine

## 2023-02-04 NOTE — ED Notes (Addendum)
Bedside toilet taken to pt room per request- outlined margins of redness to RLE with skin marker per pt request.
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
## 2023-02-05 ENCOUNTER — Telehealth: Payer: Self-pay

## 2023-02-05 ENCOUNTER — Other Ambulatory Visit (INDEPENDENT_AMBULATORY_CARE_PROVIDER_SITE_OTHER): Payer: Self-pay | Admitting: Gastroenterology

## 2023-02-05 ENCOUNTER — Telehealth: Payer: Self-pay | Admitting: Medical Oncology

## 2023-02-05 DIAGNOSIS — L03115 Cellulitis of right lower limb: Secondary | ICD-10-CM | POA: Diagnosis not present

## 2023-02-05 LAB — CBC
HCT: 23 % — ABNORMAL LOW (ref 36.0–46.0)
Hemoglobin: 7.7 g/dL — ABNORMAL LOW (ref 12.0–15.0)
MCH: 38.9 pg — ABNORMAL HIGH (ref 26.0–34.0)
MCHC: 33.5 g/dL (ref 30.0–36.0)
MCV: 116.2 fL — ABNORMAL HIGH (ref 80.0–100.0)
Platelets: 106 10*3/uL — ABNORMAL LOW (ref 150–400)
RBC: 1.98 MIL/uL — ABNORMAL LOW (ref 3.87–5.11)
RDW: 16.4 % — ABNORMAL HIGH (ref 11.5–15.5)
WBC: 2.9 10*3/uL — ABNORMAL LOW (ref 4.0–10.5)
nRBC: 0 % (ref 0.0–0.2)

## 2023-02-05 LAB — BASIC METABOLIC PANEL
Anion gap: 9 (ref 5–15)
BUN: 21 mg/dL (ref 8–23)
CO2: 25 mmol/L (ref 22–32)
Calcium: 8.9 mg/dL (ref 8.9–10.3)
Chloride: 104 mmol/L (ref 98–111)
Creatinine, Ser: 1.11 mg/dL — ABNORMAL HIGH (ref 0.44–1.00)
GFR, Estimated: 55 mL/min — ABNORMAL LOW (ref >60)
Glucose, Bld: 115 mg/dL — ABNORMAL HIGH (ref 70–99)
Potassium: 3.7 mmol/L (ref 3.5–5.1)
Sodium: 138 mmol/L (ref 135–145)

## 2023-02-05 MED ORDER — DOXYCYCLINE HYCLATE 100 MG PO TABS
100.0000 mg | ORAL_TABLET | Freq: Two times a day (BID) | ORAL | Status: DC
  Administered 2023-02-05 – 2023-02-06 (×3): 100 mg via ORAL
  Filled 2023-02-05 (×3): qty 1

## 2023-02-05 MED ORDER — PROCHLORPERAZINE EDISYLATE 10 MG/2ML IJ SOLN
5.0000 mg | INTRAMUSCULAR | Status: AC | PRN
  Administered 2023-02-05 (×2): 5 mg via INTRAVENOUS
  Filled 2023-02-05: qty 2

## 2023-02-05 MED ORDER — CHLORHEXIDINE GLUCONATE CLOTH 2 % EX PADS
6.0000 | MEDICATED_PAD | Freq: Every day | CUTANEOUS | Status: DC
  Administered 2023-02-05: 6 via TOPICAL

## 2023-02-05 MED ORDER — ONDANSETRON HCL 4 MG/2ML IJ SOLN: 4.0000 mg | Freq: Four times a day (QID) | INTRAMUSCULAR | Status: DC | PRN

## 2023-02-05 NOTE — ED Notes (Signed)
Pt vomited once, still feeling nauseated, DR Mesner aware- new orders received.

## 2023-02-05 NOTE — Progress Notes (Signed)
Patient stating that she does not want to take any "new" inhalers. Her oncologist advised her against this. She had her Breztri at the bedside so I watched her take that and then brush her teeth and clean her mouth. Please DC the Incruse and Dulera for the time being.

## 2023-02-05 NOTE — ED Notes (Signed)
Pt vomited after taking compazine PO

## 2023-02-05 NOTE — ED Notes (Signed)
Pt is not intubated- these orders mistakenly placed on wrong pt.

## 2023-02-05 NOTE — Telephone Encounter (Signed)
Pt  admitted to [facility]. On antibiotics. Ambulating.

## 2023-02-05 NOTE — Telephone Encounter (Signed)
Last seen 09/2021. Needs office visit.

## 2023-02-05 NOTE — Progress Notes (Signed)
   02/05/23 1040  Spiritual Encounters  Type of Visit Initial  Care provided to: Patient  Referral source Other (comment) (Spiritual Consult)  Reason for visit Advance directives  OnCall Visit No   Chaplain responded to a spiritual consult for advanced directive education.I met with the patient, Yvette Curry, who explained a bit of her story and the changes she would like to make pertaining her agent. I left the paper work with her and explained that should she have any questions to let her nurse know and someone will respond.   Trish [employer]  [phone]

## 2023-02-05 NOTE — Progress Notes (Signed)
Transition of Care Department [facility]) has reviewed patient and no TOC needs have been identified at this time. We will continue to monitor patient advancement through interdisciplinary progression rounds. If new patient transition needs arise, please place a TOC consult.   02/05/23 0757  TOC Brief Assessment  Insurance and Status Reviewed  Patient has primary care physician Yes  Home environment has been reviewed Lives with husband.  Prior level of function: Fairly independent.  Prior/Current Home Services No current home services  Social Determinants of Health Reivew SDOH reviewed no interventions necessary  Readmission risk has been reviewed Yes  Transition of care needs no transition of care needs at this time

## 2023-02-05 NOTE — Progress Notes (Signed)
PROGRESS NOTE     Yvette Curry, is a 67 y.o. female, DOB - [birth year], HYQ:[id number]  Admit date - 02/04/2023   Admitting Physician Onnie Boer, MD  Outpatient Primary MD for the patient is Yvette Sams, DO  LOS - 1  Chief Complaint  Patient presents with   CA Pt   Weakness   Fever        Brief Narrative:  67 y.o. female with medical history significant for stage IV lung cancer, depression, anxiety, emphysema, hypertension, systolic CHF admitted on 02/04/2023 with right lower extremity cellulitis    -Assessment and Plan: 1)Cellulitis of right lower extremity -Dopplers are negative for DVT  Immunocompromised on chemotherapy. -Received Rocephin and vancomycin -Okay to de-escalate to Rocephin and doxycycline pending blood culture data -As needed Percocet  2)Hypotension Chronic hypotension, blood pressure 99/74 here, -c/n  midodrine with parameters  3)HFrEF (heart failure with reduced ejection fraction) (HCC) Stable and compensated.  Last echo 06/2022 EF 45%. -Low-salt diet, PTA patient was not on diuretics  4)Pancytopenia--- in the setting of underlying malignancy and chemotherapy -Monitor closely, no bleeding concerns at this time WBC 5.1 >>2.9  5)COPD/Emphysema lung (HCC) Stable.  Follows with pulmonologist Dr. Tonia Brooms -Continue bronchodilators  6)Adenocarcinoma of right lung, stage 4 (HCC) Stage IV non-small cell lung cancer, with widespread metastatic adenopathy, diagnosed 2023.   -S/p radiation, - currently on Alimta and Keytruda every 3 weeks.  Follows with Dr. Shirline Frees.  Status is: Inpatient   Disposition: The patient is from: Home              Anticipated d/c is to: Home              Anticipated d/c date is: 1 day              Patient currently is not medically stable to d/c. Barriers: Not Clinically Stable-   Code Status :  -  Code Status: Full Code   Family Communication:   NA (patient is alert, awake and coherent)   DVT Prophylaxis  :    - SCDs   enoxaparin (LOVENOX) injection 40 mg Start: 02/04/23 2215   Lab Results  Component Value Date   PLT 106 (L) 02/05/2023    Inpatient Medications  Scheduled Meds:  Chlorhexidine Gluconate Cloth  6 each Topical Daily   doxycycline  100 mg Oral Q12H   enoxaparin (LOVENOX) injection  40 mg Subcutaneous Q24H   midodrine  5 mg Oral TID WC   mometasone-formoterol  2 puff Inhalation BID   umeclidinium bromide  1 puff Inhalation Daily   Continuous Infusions:  cefTRIAXone (ROCEPHIN)  IV Stopped (02/04/23 2329)   PRN Meds:.acetaminophen **OR** acetaminophen, ondansetron (ZOFRAN) IV, oxyCODONE-acetaminophen, polyethylene glycol, prochlorperazine   Anti-infectives (From admission, onward)    Start     Dose/Rate Route Frequency Ordered Stop   02/05/23 1700  vancomycin (VANCOREADY) IVPB 750 mg/150 mL  Status:  Discontinued        750 mg 150 mL/hr over 60 Minutes Intravenous Every 24 hours 02/04/23 1642 02/04/23 2207   02/05/23 1000  doxycycline (VIBRA-TABS) tablet 100 mg        100 mg Oral Every 12 hours 02/05/23 0838     02/04/23 2215  cefTRIAXone (ROCEPHIN) 2 g in sodium chloride 0.9 % 100 mL IVPB        2 g 200 mL/hr over 30 Minutes Intravenous Every 24 hours 02/04/23 2207 02/11/23 2214   02/04/23 1600  vancomycin (VANCOREADY) IVPB 1500 mg/300  mL        1,500 mg 150 mL/hr over 120 Minutes Intravenous  Once 02/04/23 1558 02/04/23 1943   02/04/23 1315  cefTRIAXone (ROCEPHIN) 2 g in sodium chloride 0.9 % 100 mL IVPB        2 g 200 mL/hr over 30 Minutes Intravenous  Once 02/04/23 1309 02/04/23 1901        Subjective: [name] today has no emesis,  No chest pain,    LPN [place] at bedside - -Patient endorses chills, no fevers -Eating and drinking okay -States right leg pain is not worse   Objective: Vitals:   02/05/23 0200 02/05/23 0400 02/05/23 0435 02/05/23 0833  BP: 90/60 121/80 108/72 125/82  Pulse: 82 93 89 (!) 101  Resp: 11 16 20 18   Temp:   (!) 97.5  F (36.4 C) 97.9 F (36.6 C)  TempSrc:    Oral  SpO2: 94% 95% 97% 99%  Weight:      Height:        Intake/Output Summary (Last 24 hours) at 02/05/2023 1124 Last data filed at 02/05/2023 0852 Gross per 24 hour  Intake 2243.2 ml  Output --  Net 2243.2 ml   Filed Weights   02/04/23 1211  Weight: 64 kg    Physical Exam  Gen:- Awake Alert, no acute distress HEENT:- [place].AT, No sclera icterus Neck-Supple Neck,No JVD,.  Lungs-  CTAB , fair symmetrical air movement CV- S1, S2 normal, regular , right subclavian area Port-A-Cath in situ Abd-  +ve B.Sounds, Abd Soft, No tenderness,    Extremity/Skin:-  pedal pulses present , right leg erythema warmth swelling and tenderness--- see photos in epic Psych-affect is appropriate, oriented x3 Neuro-no new focal deficits, no tremors  Data Reviewed: I have personally reviewed following labs and imaging studies  CBC: Recent Labs  Lab 02/04/23 1303 02/05/23 0319  WBC 5.1 2.9*  HGB 9.4* 7.7*  HCT 28.7* 23.0*  MCV 116.2* 116.2*  PLT 143* 106*   Basic Metabolic Panel: Recent Labs  Lab 02/04/23 1303 02/05/23 0319  NA 139 138  K 4.1 3.7  CL 103 104  CO2 27 25  GLUCOSE 100* 115*  BUN 22 21  CREATININE 1.18* 1.11*  CALCIUM 10.0 8.9   GFR: Estimated Creatinine Clearance: 43.3 mL/min (A) (by C-G formula based on SCr of 1.11 mg/dL (H)). Liver Function Tests: Recent Labs  Lab 02/04/23 1303  AST 26  ALT 17  ALKPHOS 47  BILITOT 0.5  PROT 6.8  ALBUMIN 2.9*   Recent Results (from the past 240 hour(s))  Blood culture (routine x 2)     Status: None (Preliminary result)   Collection Time: 02/04/23  4:09 PM   Specimen: BLOOD  Result Value Ref Range Status   Specimen Description BLOOD LEFT ANTECUBITAL  Final   Special Requests   Final    BOTTLES DRAWN AEROBIC AND ANAEROBIC Blood Culture results may not be optimal due to an excessive volume of blood received in culture bottles   Culture   Final    NO GROWTH < 24 HOURS Performed  at [facility], [address], [place], [place] [address]    Report Status PENDING  Incomplete  Blood culture (routine x 2)     Status: None (Preliminary result)   Collection Time: 02/04/23  4:16 PM   Specimen: BLOOD  Result Value Ref Range Status   Specimen Description BLOOD PORTA CATH  Final   Special Requests   Final    BOTTLES DRAWN  AEROBIC AND ANAEROBIC Blood Culture adequate volume   Culture   Final    NO GROWTH < 24 HOURS Performed at [facility], [address] [place], [place] [address]    Report Status PENDING  Incomplete    Radiology Studies: DG Tibia/Fibula Right  Result Date: 02/04/2023 CLINICAL DATA:  Cellulitis.  Evaluate for bony involvement. EXAM: RIGHT TIBIA AND FIBULA - 2 VIEW COMPARISON:  None Available. FINDINGS: No fracture or dislocation. Limited visualization of the adjacent knee and ankle is normal given obliquity and large field of view. Regional soft tissues appear normal. No subcutaneous emphysema. No radiopaque foreign body. No discrete areas of osteolysis to suggest osteomyelitis. IMPRESSION: No radiographic evidence of osteomyelitis. Electronically Signed   By: Simonne Come M.D.   On: 02/04/2023 16:59   US Venous Img Lower Unilateral Right  Result Date: 02/04/2023 CLINICAL DATA:  Right lower extremity pain and edema. Evaluate for DVT. EXAM: RIGHT LOWER EXTREMITY VENOUS DOPPLER ULTRASOUND TECHNIQUE: Gray-scale sonography with graded compression, as well as color Doppler and duplex ultrasound were performed to evaluate the lower extremity deep venous systems from the level of the common femoral vein and including the common femoral, femoral, profunda femoral, popliteal and calf veins including the posterior tibial, peroneal and gastrocnemius veins when visible. The superficial great saphenous vein was also interrogated. Spectral Doppler was utilized to evaluate flow at rest and with distal augmentation maneuvers in the common femoral, femoral and popliteal  veins. COMPARISON:  None Available. FINDINGS: Contralateral Common Femoral Vein: Respiratory phasicity is normal and symmetric with the symptomatic side. No evidence of thrombus. Normal compressibility. Common Femoral Vein: No evidence of thrombus. Normal compressibility, respiratory phasicity and response to augmentation. Saphenofemoral Junction: No evidence of thrombus. Normal compressibility and flow on color Doppler imaging. Profunda Femoral Vein: No evidence of thrombus. Normal compressibility and flow on color Doppler imaging. Femoral Vein: No evidence of thrombus. Normal compressibility, respiratory phasicity and response to augmentation. Popliteal Vein: No evidence of thrombus. Normal compressibility, respiratory phasicity and response to augmentation. Calf Veins: No evidence of thrombus. Normal compressibility and flow on color Doppler imaging. Superficial Great Saphenous Vein: No evidence of thrombus. Normal compressibility. Other Findings:  None. IMPRESSION: No evidence of DVT within the right lower extremity. Electronically Signed   By: Simonne Come M.D.   On: 02/04/2023 16:59   DG Chest Port 1 View  Result Date: 02/04/2023 CLINICAL DATA:  Sepsis. Right lower extremity swelling and pain. Weakness. EXAM: PORTABLE CHEST 1 VIEW COMPARISON:  Chest radiograph 10/20/2022 and CT 01/21/2023 FINDINGS: A right jugular Port-A-Cath terminates over the mid SVC. The cardiomediastinal silhouette is unchanged with normal heart size. Focal density in the right upper lobe corresponds to the spiculated nodule on CT. No acute airspace consolidation, edema, pleural effusion, or pneumothorax is identified. No acute osseous abnormality is seen. IMPRESSION: No evidence of acute airspace disease. Known right upper lobe nodule. Electronically Signed   By: Sebastian Ache M.D.   On: 02/04/2023 14:45    Scheduled Meds:  Chlorhexidine Gluconate Cloth  6 each Topical Daily   doxycycline  100 mg Oral Q12H   enoxaparin (LOVENOX)  injection  40 mg Subcutaneous Q24H   midodrine  5 mg Oral TID WC   mometasone-formoterol  2 puff Inhalation BID   umeclidinium bromide  1 puff Inhalation Daily   Continuous Infusions:  cefTRIAXone (ROCEPHIN)  IV Stopped (02/04/23 2329)    LOS: 1 day   Shon Hale M.D on 02/05/2023 at 11:24 AM  Go  to [email or web address] - for contact info  [facility] - Office  [phone]  If 7PM-7AM, please contact night-coverage [email or web address] 02/05/2023, 11:24 AM

## 2023-02-05 NOTE — ED Notes (Signed)
Pt taking compazine 10 mg tablet from home, this nurse observed bottle with written label of medication name aND dose- Dr Mariea Clonts made aware.

## 2023-02-05 NOTE — ED Notes (Signed)
Correction to prior entry- Dr Mariea Clonts made aware, not Dr Thomes Dinning

## 2023-02-05 NOTE — Progress Notes (Signed)
Pt to unit

## 2023-02-05 NOTE — Telephone Encounter (Signed)
Pt's husband, Adela Lank, called stating that he had taken the pt to [facility] ED for severe R calf pain and discoloration. He wanted to know if there was anything else he could do.  Pt has been admitted. Per last [facility] OV note, she was being medically managed with compression and exercise, not advised for laser ablation.

## 2023-02-06 DIAGNOSIS — L03115 Cellulitis of right lower limb: Secondary | ICD-10-CM | POA: Diagnosis not present

## 2023-02-06 LAB — CBC
HCT: 23.7 % — ABNORMAL LOW (ref 36.0–46.0)
Hemoglobin: 7.8 g/dL — ABNORMAL LOW (ref 12.0–15.0)
MCH: 38.2 pg — ABNORMAL HIGH (ref 26.0–34.0)
MCHC: 32.9 g/dL (ref 30.0–36.0)
MCV: 116.2 fL — ABNORMAL HIGH (ref 80.0–100.0)
Platelets: 100 10*3/uL — ABNORMAL LOW (ref 150–400)
RBC: 2.04 MIL/uL — ABNORMAL LOW (ref 3.87–5.11)
RDW: 16.5 % — ABNORMAL HIGH (ref 11.5–15.5)
WBC: 2.3 10*3/uL — ABNORMAL LOW (ref 4.0–10.5)
nRBC: 0 % (ref 0.0–0.2)

## 2023-02-06 MED ORDER — CEFDINIR 300 MG PO CAPS: 300.0000 mg | ORAL_CAPSULE | Freq: Two times a day (BID) | ORAL | 0 refills | Status: DC

## 2023-02-06 MED ORDER — HEPARIN SOD (PORK) LOCK FLUSH 100 UNIT/ML IV SOLN
10.0000 [IU] | Freq: Once | INTRAVENOUS | Status: DC
  Filled 2023-02-06: qty 5

## 2023-02-06 MED ORDER — KETOROLAC TROMETHAMINE 15 MG/ML IJ SOLN
15.0000 mg | Freq: Once | INTRAMUSCULAR | Status: AC
  Administered 2023-02-06: 15 mg via INTRAVENOUS
  Filled 2023-02-06: qty 1

## 2023-02-06 MED ORDER — DOXYCYCLINE HYCLATE 100 MG PO TABS
100.0000 mg | ORAL_TABLET | Freq: Two times a day (BID) | ORAL | 0 refills | Status: DC
Start: 2023-02-06 — End: 2023-02-11

## 2023-02-06 MED ORDER — OXYCODONE-ACETAMINOPHEN 5-325 MG PO TABS
1.0000 | ORAL_TABLET | Freq: Three times a day (TID) | ORAL | 0 refills | Status: DC | PRN
Start: 2023-02-06 — End: 2023-02-13

## 2023-02-06 MED ORDER — ACETAMINOPHEN 325 MG PO TABS: 650.0000 mg | ORAL_TABLET | Freq: Four times a day (QID) | ORAL | Status: DC | PRN

## 2023-02-06 MED ORDER — KETOROLAC TROMETHAMINE 15 MG/ML IJ SOLN
15.0000 mg | Freq: Three times a day (TID) | INTRAMUSCULAR | Status: DC | PRN
  Administered 2023-02-06: 15 mg via INTRAVENOUS
  Filled 2023-02-06: qty 1

## 2023-02-06 NOTE — Plan of Care (Signed)
  Problem: Health Behavior/Discharge Planning: Goal: Ability to manage health-related needs will improve Outcome: Progressing   Problem: Clinical Measurements: Goal: Diagnostic test results will improve Outcome: Progressing   Problem: Activity: Goal: Risk for activity intolerance will decrease Outcome: Progressing   Problem: Pain Managment: Goal: General experience of comfort will improve Outcome: Progressing   

## 2023-02-06 NOTE — TOC Transition Note (Signed)
Transition of Care [facility]) - CM/SW Discharge Note   Patient Details  Name: Yvette Curry MRN: [id number] Date of Birth: 1955/12/09  [facility]) CM/SW Contact:  Ariany Kesselman A Rockwell Zentz, RN Phone Number: 02/06/2023, 1:42 PM   Clinical Narrative:    MD placed orders for discharge home. No further TOC needs have been identified at this time.   Final next level of care: Home/Self Care Barriers to Discharge: Barriers Resolved   Patient Goals and CMS Choice CMS Medicare.gov Compare Post Acute Care list provided to:: Patient Choice offered to / list presented to : NA  Discharge Placement                         Discharge Plan and Services Additional resources added to the After Visit Summary for                                       Social Determinants of Health (SDOH) Interventions SDOH Screenings   Food Insecurity: No Food Insecurity (02/04/2023)  Housing: Low Risk  (02/04/2023)  Transportation Needs: No Transportation Needs (02/04/2023)  Utilities: Not At Risk (02/04/2023)  Depression (PHQ2-9): Low Risk  (11/14/2022)  Tobacco Use: High Risk (02/04/2023)     Readmission Risk Interventions     No data to display

## 2023-02-06 NOTE — Discharge Instructions (Signed)
1)Follow-up to primary care physician Dr. Adriana Simas, Dorie Rank for recheck and reevaluation and for repeat CBC and BMP around Wednesday 02/11/23 2)Follow up with your oncologist Dr. Arlis Porta as previously scheduled around October 30th
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
## 2023-02-06 NOTE — Care Management Important Message (Signed)
Important Message  Patient Details  Name: Yvette Curry MRN: [id number] Date of Birth: [birth year]   Important Message Given:  N/A - LOS <3 / Initial given by admissions     Corey Harold 02/06/2023, 9:56 AM

## 2023-02-06 NOTE — Progress Notes (Signed)
Nsg Discharge Note  Admit Date:  02/04/2023 Discharge date: 02/06/2023   LAINA SYREK to be D/C'd Home per MD order.  AVS completed.  Copy for chart, and copy for patient signed, and dated. Patient/caregiver able to verbalize understanding.  Discharge Medication: Allergies as of 02/06/2023       Reactions   Lidocaine Shortness Of Breath, Anxiety   Patient felt like she "couldn't breathe", panicky Allergic to all " caines"   Mepivacaine Swelling   angioedema   Demerol Nausea And Vomiting   Prednisone Hives, Nausea And Vomiting   abd pain and vomiting, Hives    Sulfa Antibiotics Hives   Hives, swelling and itching        Medication List     TAKE these medications    acetaminophen 325 MG tablet Commonly known as: TYLENOL Take 2 tablets (650 mg total) by mouth every 6 (six) hours as needed for mild pain (pain score 1-3) (or Fever >/= 101).   AeroChamber MV inhaler Use as instructed   ascorbic acid 500 MG tablet Commonly known as: VITAMIN C Take by mouth.   bevacizumab 1.25 mg/0.1 mL Soln Commonly known as: AVASTIN 1.25 mg by Intravitreal route to Surgery. Into right eye for swelling behind retina   Breztri Aerosphere 160-9-4.8 MCG/ACT Aero Generic drug: Budeson-Glycopyrrol-Formoterol Inhale 2 puffs into the lungs in the morning and at bedtime.   carboxymethylcellulose 1 % ophthalmic solution Apply 1 drop to eye 3 (three) times daily.   cefdinir 300 MG capsule Commonly known as: OMNICEF Take 1 capsule (300 mg total) by mouth 2 (two) times daily for 5 days.   doxycycline 100 MG tablet Commonly known as: VIBRA-TABS Take 1 tablet (100 mg total) by mouth 2 (two) times daily for 5 days.   ferrous sulfate 324 MG Tbec Take 1 tablet (324 mg total) by mouth daily with breakfast.   folic acid 1 MG tablet Commonly known as: FOLVITE Take 1 tablet (1 mg total) by mouth daily.   midodrine 5 MG tablet Commonly known as: PROAMATINE Take 1 tablet (5 mg total) by  mouth 3 (three) times daily with meals. What changed: additional instructions   Omron 3 Series BP Monitor Devi Use as directed   oxyCODONE-acetaminophen 5-325 MG tablet Commonly known as: Percocet Take 1 tablet by mouth every 8 (eight) hours as needed for severe pain (pain score 7-10).   prochlorperazine 10 MG tablet Commonly known as: COMPAZINE Take 10 mg by mouth every 6 (six) hours as needed for nausea or vomiting.        Discharge Assessment: Vitals:   02/06/23 0955 02/06/23 1008  BP: 119/75   Pulse: 97   Resp: 16   Temp: 98 F (36.7 C)   SpO2: 100% 92%   Skin clean, dry and intact without evidence of skin break down, no evidence of skin tears noted. IV catheter discontinued intact. Site without signs and symptoms of complications - no redness or edema noted at insertion site, patient denies c/o pain - only slight tenderness at site.  Dressing with slight pressure applied.  D/c Instructions-Education: Discharge instructions given to patient/family with verbalized understanding. D/c education completed with patient/family including follow up instructions, medication list, d/c activities limitations if indicated, with other d/c instructions as indicated by MD - patient able to verbalize understanding, all questions fully answered. Patient instructed to return to ED, call 911, or call MD for any changes in condition.  Patient escorted via WC, and D/C home via private auto.  Demetrio Lapping, LPN 45/40/9811 9:14 PM

## 2023-02-09 ENCOUNTER — Emergency Department (HOSPITAL_COMMUNITY): Payer: Medicare Other

## 2023-02-09 ENCOUNTER — Inpatient Hospital Stay (HOSPITAL_COMMUNITY)
Admission: EM | Admit: 2023-02-09 | Discharge: 2023-02-13 | DRG: 602 | Disposition: A | Discharge status: Home / Self Care | Payer: Medicare Other | Attending: Internal Medicine | Admitting: Internal Medicine

## 2023-02-09 ENCOUNTER — Telehealth: Payer: Self-pay | Admitting: Family Medicine

## 2023-02-09 ENCOUNTER — Other Ambulatory Visit: Payer: Self-pay | Admitting: Family Medicine

## 2023-02-09 ENCOUNTER — Telehealth: Payer: Self-pay

## 2023-02-09 ENCOUNTER — Encounter (HOSPITAL_COMMUNITY): Payer: Self-pay

## 2023-02-09 ENCOUNTER — Other Ambulatory Visit: Payer: Self-pay

## 2023-02-09 ENCOUNTER — Telehealth: Payer: Self-pay | Admitting: *Deleted

## 2023-02-09 ENCOUNTER — Encounter: Payer: Self-pay | Admitting: *Deleted

## 2023-02-09 DIAGNOSIS — D61810 Antineoplastic chemotherapy induced pancytopenia: Secondary | ICD-10-CM | POA: Diagnosis present

## 2023-02-09 DIAGNOSIS — J9811 Atelectasis: Secondary | ICD-10-CM | POA: Diagnosis present

## 2023-02-09 DIAGNOSIS — F17210 Nicotine dependence, cigarettes, uncomplicated: Secondary | ICD-10-CM | POA: Diagnosis present

## 2023-02-09 DIAGNOSIS — I5032 Chronic diastolic (congestive) heart failure: Secondary | ICD-10-CM | POA: Diagnosis present

## 2023-02-09 DIAGNOSIS — I110 Hypertensive heart disease with heart failure: Secondary | ICD-10-CM | POA: Diagnosis present

## 2023-02-09 DIAGNOSIS — K219 Gastro-esophageal reflux disease without esophagitis: Secondary | ICD-10-CM | POA: Diagnosis present

## 2023-02-09 DIAGNOSIS — E7800 Pure hypercholesterolemia, unspecified: Secondary | ICD-10-CM | POA: Diagnosis present

## 2023-02-09 DIAGNOSIS — C779 Secondary and unspecified malignant neoplasm of lymph node, unspecified: Secondary | ICD-10-CM | POA: Diagnosis present

## 2023-02-09 DIAGNOSIS — D649 Anemia, unspecified: Secondary | ICD-10-CM

## 2023-02-09 DIAGNOSIS — L03115 Cellulitis of right lower limb: Principal | ICD-10-CM | POA: Diagnosis present

## 2023-02-09 DIAGNOSIS — D84821 Immunodeficiency due to drugs: Secondary | ICD-10-CM | POA: Diagnosis present

## 2023-02-09 DIAGNOSIS — Z825 Family history of asthma and other chronic lower respiratory diseases: Secondary | ICD-10-CM | POA: Diagnosis not present

## 2023-02-09 DIAGNOSIS — Z7951 Long term (current) use of inhaled steroids: Secondary | ICD-10-CM | POA: Diagnosis not present

## 2023-02-09 DIAGNOSIS — Z90710 Acquired absence of both cervix and uterus: Secondary | ICD-10-CM

## 2023-02-09 DIAGNOSIS — Z923 Personal history of irradiation: Secondary | ICD-10-CM

## 2023-02-09 DIAGNOSIS — H3530 Unspecified macular degeneration: Secondary | ICD-10-CM | POA: Diagnosis present

## 2023-02-09 DIAGNOSIS — Z79899 Other long term (current) drug therapy: Secondary | ICD-10-CM | POA: Diagnosis not present

## 2023-02-09 DIAGNOSIS — J439 Emphysema, unspecified: Secondary | ICD-10-CM | POA: Diagnosis present

## 2023-02-09 DIAGNOSIS — Z884 Allergy status to anesthetic agent status: Secondary | ICD-10-CM

## 2023-02-09 DIAGNOSIS — T451X5A Adverse effect of antineoplastic and immunosuppressive drugs, initial encounter: Secondary | ICD-10-CM | POA: Diagnosis present

## 2023-02-09 DIAGNOSIS — I5022 Chronic systolic (congestive) heart failure: Secondary | ICD-10-CM | POA: Diagnosis present

## 2023-02-09 DIAGNOSIS — Z8249 Family history of ischemic heart disease and other diseases of the circulatory system: Secondary | ICD-10-CM

## 2023-02-09 DIAGNOSIS — Z885 Allergy status to narcotic agent status: Secondary | ICD-10-CM

## 2023-02-09 DIAGNOSIS — Z882 Allergy status to sulfonamides status: Secondary | ICD-10-CM | POA: Diagnosis not present

## 2023-02-09 DIAGNOSIS — C3491 Malignant neoplasm of unspecified part of right bronchus or lung: Secondary | ICD-10-CM | POA: Diagnosis present

## 2023-02-09 DIAGNOSIS — Z888 Allergy status to other drugs, medicaments and biological substances status: Secondary | ICD-10-CM | POA: Diagnosis not present

## 2023-02-09 DIAGNOSIS — R748 Abnormal levels of other serum enzymes: Secondary | ICD-10-CM | POA: Diagnosis present

## 2023-02-09 DIAGNOSIS — D539 Nutritional anemia, unspecified: Secondary | ICD-10-CM | POA: Diagnosis present

## 2023-02-09 DIAGNOSIS — I5020 Unspecified systolic (congestive) heart failure: Secondary | ICD-10-CM | POA: Diagnosis present

## 2023-02-09 DIAGNOSIS — I10 Essential (primary) hypertension: Secondary | ICD-10-CM

## 2023-02-09 DIAGNOSIS — J449 Chronic obstructive pulmonary disease, unspecified: Secondary | ICD-10-CM | POA: Diagnosis present

## 2023-02-09 DIAGNOSIS — K5900 Constipation, unspecified: Secondary | ICD-10-CM | POA: Diagnosis present

## 2023-02-09 DIAGNOSIS — I872 Venous insufficiency (chronic) (peripheral): Secondary | ICD-10-CM | POA: Diagnosis present

## 2023-02-09 DIAGNOSIS — Z8051 Family history of malignant neoplasm of kidney: Secondary | ICD-10-CM

## 2023-02-09 DIAGNOSIS — D61818 Other pancytopenia: Secondary | ICD-10-CM | POA: Diagnosis not present

## 2023-02-09 DIAGNOSIS — Z515 Encounter for palliative care: Secondary | ICD-10-CM | POA: Diagnosis not present

## 2023-02-09 DIAGNOSIS — M7989 Other specified soft tissue disorders: Principal | ICD-10-CM

## 2023-02-09 DIAGNOSIS — T501X5A Adverse effect of loop [high-ceiling] diuretics, initial encounter: Secondary | ICD-10-CM | POA: Diagnosis present

## 2023-02-09 DIAGNOSIS — I952 Hypotension due to drugs: Secondary | ICD-10-CM | POA: Diagnosis present

## 2023-02-09 DIAGNOSIS — M79605 Pain in left leg: Secondary | ICD-10-CM | POA: Diagnosis not present

## 2023-02-09 DIAGNOSIS — Z7189 Other specified counseling: Secondary | ICD-10-CM | POA: Diagnosis not present

## 2023-02-09 DIAGNOSIS — M79604 Pain in right leg: Secondary | ICD-10-CM | POA: Diagnosis not present

## 2023-02-09 LAB — CBC WITH DIFFERENTIAL/PLATELET
Abs Immature Granulocytes: 0 10*3/uL (ref 0.00–0.07)
Basophils Absolute: 0 10*3/uL (ref 0.0–0.1)
Basophils Relative: 0 %
Eosinophils Absolute: 0.1 10*3/uL (ref 0.0–0.5)
Eosinophils Relative: 3 %
HCT: 24.3 % — ABNORMAL LOW (ref 36.0–46.0)
Hemoglobin: 8.1 g/dL — ABNORMAL LOW (ref 12.0–15.0)
Lymphocytes Relative: 58 %
Lymphs Abs: 1.6 10*3/uL (ref 0.7–4.0)
MCH: 38 pg — ABNORMAL HIGH (ref 26.0–34.0)
MCHC: 33.3 g/dL (ref 30.0–36.0)
MCV: 114.1 fL — ABNORMAL HIGH (ref 80.0–100.0)
Monocytes Absolute: 0.4 10*3/uL (ref 0.1–1.0)
Monocytes Relative: 16 %
Neutro Abs: 0.6 10*3/uL — ABNORMAL LOW (ref 1.7–7.7)
Neutrophils Relative %: 23 %
Platelets: 103 10*3/uL — ABNORMAL LOW (ref 150–400)
RBC: 2.13 MIL/uL — ABNORMAL LOW (ref 3.87–5.11)
RDW: 16.2 % — ABNORMAL HIGH (ref 11.5–15.5)
WBC: 2.8 10*3/uL — ABNORMAL LOW (ref 4.0–10.5)
nRBC: 0 % (ref 0.0–0.2)

## 2023-02-09 LAB — CULTURE, BLOOD (ROUTINE X 2)
Culture: NO GROWTH
Culture: NO GROWTH
Special Requests: ADEQUATE

## 2023-02-09 LAB — COMPREHENSIVE METABOLIC PANEL
ALT: 48 U/L — ABNORMAL HIGH (ref 0–44)
AST: 58 U/L — ABNORMAL HIGH (ref 15–41)
Albumin: 2.6 g/dL — ABNORMAL LOW (ref 3.5–5.0)
Alkaline Phosphatase: 52 U/L (ref 38–126)
Anion gap: 6 (ref 5–15)
BUN: 16 mg/dL (ref 8–23)
CO2: 25 mmol/L (ref 22–32)
Calcium: 9.1 mg/dL (ref 8.9–10.3)
Chloride: 106 mmol/L (ref 98–111)
Creatinine, Ser: 0.97 mg/dL (ref 0.44–1.00)
GFR, Estimated: >60 mL/min (ref >60)
Glucose, Bld: 117 mg/dL — ABNORMAL HIGH (ref 70–99)
Potassium: 3.6 mmol/L (ref 3.5–5.1)
Sodium: 137 mmol/L (ref 135–145)
Total Bilirubin: 0.3 mg/dL (ref 0.3–1.2)
Total Protein: 6.5 g/dL (ref 6.5–8.1)

## 2023-02-09 LAB — LACTIC ACID, PLASMA: Lactic Acid, Venous: 1.5 mmol/L (ref 0.5–1.9)

## 2023-02-09 LAB — CK: Total CK: 20 U/L — ABNORMAL LOW (ref 38–234)

## 2023-02-09 MED ORDER — ONDANSETRON HCL 4 MG/2ML IJ SOLN
4.0000 mg | Freq: Once | INTRAMUSCULAR | Status: AC
  Administered 2023-02-09: 4 mg via INTRAVENOUS
  Filled 2023-02-09: qty 2

## 2023-02-09 MED ORDER — VANCOMYCIN HCL IN DEXTROSE 1-5 GM/200ML-% IV SOLN
1000.0000 mg | Freq: Once | INTRAVENOUS | Status: AC
  Administered 2023-02-09: 1000 mg via INTRAVENOUS
  Filled 2023-02-09: qty 200

## 2023-02-09 MED ORDER — HYDROMORPHONE HCL 1 MG/ML IJ SOLN
0.5000 mg | Freq: Once | INTRAMUSCULAR | Status: AC
  Administered 2023-02-09: 0.5 mg via INTRAVENOUS

## 2023-02-09 MED ORDER — HYDROMORPHONE HCL 1 MG/ML IJ SOLN
1.0000 mg | Freq: Once | INTRAMUSCULAR | Status: DC
  Filled 2023-02-09: qty 1

## 2023-02-09 NOTE — Telephone Encounter (Signed)
Patient was in the hospital from Wednesday - Friday they said she has cellulitis in her leg. He said that the hospital advised them to call their PCP to get lab work close to Wednesday as possible. Please advise.

## 2023-02-09 NOTE — ED Triage Notes (Signed)
C/o RLE swelling with redness and pain to right calf x 1.5 week. Patient seen for same on 10/16 No redness noted outside of perimeters from visit on 10/16  Hx of lung CA with last chemo 10/10

## 2023-02-09 NOTE — Transitions of Care (Post Inpatient/ED Visit) (Signed)
02/09/2023  Name: Yvette Curry MRN: [id number] DOB: [birth year]  Today's TOC FU Call Status: Today's TOC FU Call Status:: Successful TOC FU Call Completed TOC FU Call Complete Date: 02/09/23 Patient's Name and Date of Birth confirmed.  Transition Care Management Follow-up Telephone Call Date of Discharge: 02/06/23 Discharge [facility]: [name] Type of Discharge: Inpatient Admission Primary Inpatient Discharge Diagnosis:: Cellulitis of Rt. Lower Extremity How have you been since you were released from the hospital?: Same Any questions or concerns?: Yes Patient Questions/Concerns:: Continued pain, issue not resolving Patient Questions/Concerns Addressed: Notified Provider of Patient Questions/Concerns  Items Reviewed: Did you receive and understand the discharge instructions provided?: Yes Medications obtained,verified, and reconciled?: Yes (Medications Reviewed) Any new allergies since your discharge?: No Dietary orders reviewed?: No Do you have support at home?: Yes People in Home: spouse Name of Support/Comfort Primary Source: Yvette Curry  Medications Reviewed Today: Medications Reviewed Today     Reviewed by Yvette Gross, RN (Case Manager) on 02/09/23 at 1155  Med List Status: <None>   Medication Order Taking? Sig Documenting Provider Last Dose Status Informant  acetaminophen (TYLENOL) 325 MG tablet [id number] Yes Take 2 tablets (650 mg total) by mouth every 6 (six) hours as needed for mild pain (pain score 1-3) (or Fever >/= 101). Shon Hale, MD Taking Active   ascorbic acid (VITAMIN C) 500 MG tablet [id number] Yes Take by mouth. [provider] Taking Active Self, Spouse/Significant Other  bevacizumab (AVASTIN) 1.25 mg/0.1 mL SOLN [id number] Yes 1.25 mg by Intravitreal route to Surgery. Into right eye for swelling behind retina [provider] Taking Active Self, Spouse/Significant Other  Blood Pressure Monitoring (OMRON 3 SERIES BP MONITOR) DEVI  [id number] Yes Use as directed Yvette Dory, NP Taking Active Self, Spouse/Significant Other  Budeson-Glycopyrrol-Formoterol (BREZTRI AEROSPHERE) 160-9-4.8 MCG/ACT AERO [id number] Yes Inhale 2 puffs into the lungs in the morning and at bedtime. Josephine Igo, DO Taking Active Self, Spouse/Significant Other  carboxymethylcellulose 1 % ophthalmic solution [id number] Yes Apply 1 drop to eye 3 (three) times daily. [provider] Taking Active Self, Spouse/Significant Other  cefdinir (OMNICEF) 300 MG capsule [id number] Yes Take 1 capsule (300 mg total) by mouth 2 (two) times daily for 5 days. Shon Hale, MD Taking Active   doxycycline (VIBRA-TABS) 100 MG tablet [id number] Yes Take 1 tablet (100 mg total) by mouth 2 (two) times daily for 5 days. Shon Hale, MD Taking Active   ferrous sulfate 324 MG TBEC [id number] Yes Take 1 tablet (324 mg total) by mouth daily with breakfast. Shon Hale, MD Taking Active Self, Spouse/Significant Other  folic acid (FOLVITE) 1 MG tablet [id number] Yes Take 1 tablet (1 mg total) by mouth daily. Shon Hale, MD Taking Active Self, Spouse/Significant Other  midodrine (PROAMATINE) 5 MG tablet [id number] Yes Take 1 tablet (5 mg total) by mouth 3 (three) times daily with meals.  Patient taking differently: Take 5 mg by mouth 3 (three) times daily with meals. Only when the top number drops below 90   Yvette Dory, NP Taking Active Self, Spouse/Significant Other  oxyCODONE-acetaminophen (PERCOCET) 5-325 MG tablet [id number] Yes Take 1 tablet by mouth every 8 (eight) hours as needed for severe pain (pain score 7-10). Shon Hale, MD Taking Active   prochlorperazine (COMPAZINE) 10 MG tablet [id number] Yes Take 10 mg by mouth every 6 (six) hours as needed for nausea or vomiting. [provider] Taking Active Self, Spouse/Significant Other  Spacer/Aero-Holding Chambers (AEROCHAMBER MV) inhaler [id number] Yes Use as instructed Icard, Rachel Bo,  DO Taking Active Self, Spouse/Significant Other  Med List Note (Ward, Angelica, CPhT 02/04/23 1552): Also receives eye injections for swelling posterior to retina            Home Care and Equipment/Supplies: Were Home Health Services Ordered?: No Any new equipment or medical supplies ordered?: No  Functional Questionnaire: Do you need assistance with bathing/showering or dressing?: Yes Do you need assistance with meal preparation?: No Do you need assistance with eating?: No Do you have difficulty maintaining continence: No Do you need assistance with getting out of bed/getting out of a chair/moving?: Yes (has to use walker) Do you have difficulty managing or taking your medications?: No  Follow up appointments reviewed: PCP Follow-up appointment confirmed?: Yes Date of PCP follow-up appointment?: 02/17/23 Follow-up Provider: Dr. Adriana Simas [facility] Follow-up appointment confirmed?: Yes Date of Specialist follow-up appointment?: 02/13/23 Follow-Up Specialty Provider:: Yvette Dory Do you need transportation to your follow-up appointment?: No Do you understand care options if your condition(s) worsen?: Yes-patient verbalized understanding  SDOH Interventions Today    Flowsheet Row Most Recent Value  SDOH Interventions   Food Insecurity Interventions Intervention Not Indicated  Housing Interventions Intervention Not Indicated  Transportation Interventions Intervention Not Indicated      TOC Interventions Today    Flowsheet Row Most Recent Value  TOC Interventions   TOC Interventions Discussed/Reviewed TOC Interventions Discussed, TOC Interventions Reviewed, Contacted provider for patient needs  [facility] sent to Dr. Adriana Simas about patients pain and leg tightness.  He advised she should go to the ED]      Yvette Gross RN, BSN, CCM RN Care Manager  Transitions of Care  [facility] [employer]  [phone]

## 2023-02-09 NOTE — Telephone Encounter (Signed)
Sent mychart message

## 2023-02-09 NOTE — ED Notes (Signed)
Charge aware of difficulty accessing port. Oncoming Rn aware.

## 2023-02-09 NOTE — Telephone Encounter (Signed)
Yvette Curry called to say she is on her way to the [facility] ED. Called PCP to tell them about excruciating pain in her calf, they instructed her to go to the Ed.

## 2023-02-10 ENCOUNTER — Inpatient Hospital Stay (HOSPITAL_COMMUNITY): Payer: Medicare Other

## 2023-02-10 DIAGNOSIS — I5020 Unspecified systolic (congestive) heart failure: Secondary | ICD-10-CM

## 2023-02-10 DIAGNOSIS — D539 Nutritional anemia, unspecified: Secondary | ICD-10-CM | POA: Diagnosis not present

## 2023-02-10 DIAGNOSIS — L03115 Cellulitis of right lower limb: Secondary | ICD-10-CM | POA: Diagnosis not present

## 2023-02-10 DIAGNOSIS — D61818 Other pancytopenia: Secondary | ICD-10-CM

## 2023-02-10 DIAGNOSIS — J439 Emphysema, unspecified: Secondary | ICD-10-CM

## 2023-02-10 DIAGNOSIS — C3491 Malignant neoplasm of unspecified part of right bronchus or lung: Secondary | ICD-10-CM

## 2023-02-10 LAB — CBC
HCT: 23.8 % — ABNORMAL LOW (ref 36.0–46.0)
Hemoglobin: 7.9 g/dL — ABNORMAL LOW (ref 12.0–15.0)
MCH: 38.2 pg — ABNORMAL HIGH (ref 26.0–34.0)
MCHC: 33.2 g/dL (ref 30.0–36.0)
MCV: 115 fL — ABNORMAL HIGH (ref 80.0–100.0)
Platelets: 102 10*3/uL — ABNORMAL LOW (ref 150–400)
RBC: 2.07 MIL/uL — ABNORMAL LOW (ref 3.87–5.11)
RDW: 16.4 % — ABNORMAL HIGH (ref 11.5–15.5)
WBC: 2.9 10*3/uL — ABNORMAL LOW (ref 4.0–10.5)
nRBC: 0 % (ref 0.0–0.2)

## 2023-02-10 LAB — COMPREHENSIVE METABOLIC PANEL
ALT: 46 U/L — ABNORMAL HIGH (ref 0–44)
AST: 51 U/L — ABNORMAL HIGH (ref 15–41)
Albumin: 2.5 g/dL — ABNORMAL LOW (ref 3.5–5.0)
Alkaline Phosphatase: 53 U/L (ref 38–126)
Anion gap: 6 (ref 5–15)
BUN: 16 mg/dL (ref 8–23)
CO2: 27 mmol/L (ref 22–32)
Calcium: 9 mg/dL (ref 8.9–10.3)
Chloride: 104 mmol/L (ref 98–111)
Creatinine, Ser: 0.94 mg/dL (ref 0.44–1.00)
GFR, Estimated: >60 mL/min (ref >60)
Glucose, Bld: 98 mg/dL (ref 70–99)
Potassium: 3.9 mmol/L (ref 3.5–5.1)
Sodium: 137 mmol/L (ref 135–145)
Total Bilirubin: 0.3 mg/dL (ref 0.3–1.2)
Total Protein: 6.3 g/dL — ABNORMAL LOW (ref 6.5–8.1)

## 2023-02-10 LAB — FOLATE: Folate: 31.9 ng/mL (ref >5.9)

## 2023-02-10 LAB — PHOSPHORUS: Phosphorus: 3.3 mg/dL (ref 2.5–4.6)

## 2023-02-10 LAB — VITAMIN B12: Vitamin B-12: 480 pg/mL (ref 180–914)

## 2023-02-10 LAB — MAGNESIUM: Magnesium: 2 mg/dL (ref 1.7–2.4)

## 2023-02-10 MED ORDER — ACETAMINOPHEN 325 MG PO TABS
650.0000 mg | ORAL_TABLET | Freq: Four times a day (QID) | ORAL | Status: DC | PRN
  Administered 2023-02-10: 650 mg via ORAL
  Filled 2023-02-10 (×2): qty 2

## 2023-02-10 MED ORDER — OXYCODONE-ACETAMINOPHEN 5-325 MG PO TABS
1.0000 | ORAL_TABLET | Freq: Three times a day (TID) | ORAL | Status: DC | PRN
  Administered 2023-02-10 – 2023-02-11 (×3): 1 via ORAL
  Filled 2023-02-10 (×3): qty 1

## 2023-02-10 MED ORDER — ENOXAPARIN SODIUM 40 MG/0.4ML IJ SOSY
40.0000 mg | PREFILLED_SYRINGE | INTRAMUSCULAR | Status: DC
Start: 2023-02-10 — End: 2023-02-13
  Administered 2023-02-10 – 2023-02-12 (×3): 40 mg via SUBCUTANEOUS
  Filled 2023-02-10 (×4): qty 0.4

## 2023-02-10 MED ORDER — VITAMIN C 500 MG PO TABS
500.0000 mg | ORAL_TABLET | Freq: Every day | ORAL | Status: DC
  Administered 2023-02-11 – 2023-02-13 (×3): 500 mg via ORAL
  Filled 2023-02-10 (×4): qty 1

## 2023-02-10 MED ORDER — FERROUS SULFATE 325 (65 FE) MG PO TABS
324.0000 mg | ORAL_TABLET | Freq: Every day | ORAL | Status: DC
  Administered 2023-02-11 – 2023-02-13 (×3): 324 mg via ORAL
  Filled 2023-02-10 (×3): qty 1

## 2023-02-10 MED ORDER — POLYVINYL ALCOHOL 1.4 % OP SOLN
1.0000 [drp] | Freq: Three times a day (TID) | OPHTHALMIC | Status: DC
  Filled 2023-02-10 (×2): qty 15

## 2023-02-10 MED ORDER — FOLIC ACID 1 MG PO TABS
1.0000 mg | ORAL_TABLET | Freq: Every day | ORAL | Status: DC
  Administered 2023-02-10 – 2023-02-13 (×4): 1 mg via ORAL
  Filled 2023-02-10 (×4): qty 1

## 2023-02-10 MED ORDER — SODIUM CHLORIDE 0.9 % IV SOLN
2.0000 g | Freq: Two times a day (BID) | INTRAVENOUS | Status: DC
  Administered 2023-02-10 – 2023-02-13 (×6): 2 g via INTRAVENOUS
  Filled 2023-02-10 (×7): qty 12.5

## 2023-02-10 MED ORDER — ONDANSETRON HCL 4 MG PO TABS: 4.0000 mg | ORAL_TABLET | Freq: Four times a day (QID) | ORAL | Status: DC | PRN

## 2023-02-10 MED ORDER — CHLORHEXIDINE GLUCONATE CLOTH 2 % EX PADS
6.0000 | MEDICATED_PAD | Freq: Every day | CUTANEOUS | Status: DC
Start: 2023-02-10 — End: 2023-02-13
  Administered 2023-02-10 – 2023-02-13 (×4): 6 via TOPICAL

## 2023-02-10 MED ORDER — MIDODRINE HCL 5 MG PO TABS
5.0000 mg | ORAL_TABLET | Freq: Three times a day (TID) | ORAL | Status: DC
  Administered 2023-02-11: 5 mg via ORAL
  Filled 2023-02-10 (×2): qty 1

## 2023-02-10 MED ORDER — BUDESON-GLYCOPYRROL-FORMOTEROL 160-9-4.8 MCG/ACT IN AERO
2.0000 | INHALATION_SPRAY | Freq: Two times a day (BID) | RESPIRATORY_TRACT | Status: DC
Start: 2023-02-10 — End: 2023-02-13
  Administered 2023-02-10 – 2023-02-13 (×7): 2 via RESPIRATORY_TRACT

## 2023-02-10 MED ORDER — GABAPENTIN 100 MG PO CAPS
200.0000 mg | ORAL_CAPSULE | Freq: Two times a day (BID) | ORAL | Status: DC
  Administered 2023-02-10 – 2023-02-13 (×7): 200 mg via ORAL
  Filled 2023-02-10 (×7): qty 2

## 2023-02-10 MED ORDER — VANCOMYCIN HCL IN DEXTROSE 1-5 GM/200ML-% IV SOLN
1000.0000 mg | INTRAVENOUS | Status: DC
  Administered 2023-02-10 – 2023-02-12 (×3): 1000 mg via INTRAVENOUS
  Filled 2023-02-10 (×3): qty 200

## 2023-02-10 MED ORDER — ONDANSETRON HCL 4 MG/2ML IJ SOLN
4.0000 mg | Freq: Four times a day (QID) | INTRAMUSCULAR | Status: DC | PRN
  Administered 2023-02-11: 4 mg via INTRAVENOUS
  Filled 2023-02-10: qty 2

## 2023-02-10 MED ORDER — CARBOXYMETHYLCELLULOSE SODIUM 1 % OP SOLN: 1.0000 [drp] | Freq: Three times a day (TID) | OPHTHALMIC | Status: DC

## 2023-02-10 MED ORDER — SODIUM CHLORIDE 0.9 % IV SOLN
2.0000 g | Freq: Once | INTRAVENOUS | Status: AC
  Administered 2023-02-10: 2 g via INTRAVENOUS
  Filled 2023-02-10: qty 12.5

## 2023-02-10 MED ORDER — VANCOMYCIN HCL IN DEXTROSE 1-5 GM/200ML-% IV SOLN: 1000.0000 mg | Freq: Once | INTRAVENOUS | Status: DC

## 2023-02-10 MED ORDER — ACETAMINOPHEN 650 MG RE SUPP: 650.0000 mg | Freq: Four times a day (QID) | RECTAL | Status: DC | PRN

## 2023-02-10 NOTE — Progress Notes (Signed)
Patient refused Midodrine and vitamin C , states she takes iron with the vitamin C Dr. Blake Divine made aware

## 2023-02-10 NOTE — Progress Notes (Signed)
Patient and husband arrived to room. Patient alert and oriented x4. VSS. Patient resting in bed. Bed alarm on and call bell within reach.

## 2023-02-10 NOTE — Progress Notes (Signed)
[facility]                                                                               Yvette Curry, is a 67 y.o. female, DOB - 1956/01/28, ZOX:[id number] Admit date - 02/09/2023    Outpatient Primary MD for the patient is Yvette Sams, DO  LOS - 1  days    Brief summary   67 y.o. female with medical history significant of hypertension, systolic CHF, stage IV lung cancer, anxiety, depression, emphysema who presents to the emergency department due to about 10-day onset of right leg swelling with pain and redness to right calf.  She was recently admitted from 10/16 to 10/18 due to cellulitis of right lower extremity which was treated with Rocephin and vancomycin and de-escalated to Rocephin and doxycycline with improved cellulitis, she was then discharged with Omnicef and doxycycline.    Blood culture showed no growth in 5 days. Right lower extremity venous Doppler ultrasound was negative for DVT. Patient was treated with Dilaudid and Zofran, IV vancomycin was given. Hospitalist was asked to admit patient for further evaluation and management.  Assessment & Plan    Assessment and Plan:   RECURRENT CELLULITIS in the right lower extremity, with some signs of cellulitis with pain and tenderness in the left lower extremity.  In the setting of venous insufficiency of both extremities.  Venous duplex of the lower extremity is negative for DVT.  Differential include neuropathy from the chemotherapy vs PVD vs inflammatory conditions.  ABI'S ordered.  She was seen in 09/2022 by vascular surgeon for venous insufficiency and recommended compression stockings. Recommend outpatient follow up with vascular surgery outpatient .  Recommend starting her gabapentin 200 mg BID.  She is very  hesitant to start any stronger pain meds.     Pancytopenia:  From chemo on 01/29/23.  Monitor and transfuse as needed.    Elevated liver enzymes : From hepatic steatosis?  Monitor.    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  Follows up with Dr Arbutus Ped.    Chronic systolic heart failure.  She appears compensated. She reports that she became hypotensive with lasix.  She would benefit from lasix to help with the edema of the lower extremity,.   COPD;  Stable. No wheezing heard.    Hypotension:  Resolved  Restarted her on Midodrine.    Estimated body mass index is 27.13 kg/m as calculated from the following:   Height as of this encounter: 5\' 1"  (1.549 m).   Weight as of this encounter: 65.1 kg.  Code Status: full code.  DVT Prophylaxis:  enoxaparin (LOVENOX) injection 40 mg Start: 02/10/23 1000 SCDs Start: 02/10/23 0452   Level of Care: Level of care: Med-Surg Family Communication: family at bedside.   Disposition Plan:     Remains inpatient appropriate:  IV antibiotics and work up for recurrent cellulitis.   Procedures:  ABI   Consultants:   NONE.   Antimicrobials:   Anti-infectives (From admission, onward)    Start     Dose/Rate Route Frequency Ordered Stop   02/10/23 1800  vancomycin (VANCOCIN) IVPB 1000 mg/200 mL premix  1,000 mg 200 mL/hr over 60 Minutes Intravenous Every 24 hours 02/10/23 0526     02/10/23 1800  ceFEPIme (MAXIPIME) 2 g in sodium chloride 0.9 % 100 mL IVPB        2 g 200 mL/hr over 30 Minutes Intravenous Every 12 hours 02/10/23 0526     02/10/23 0530  vancomycin (VANCOCIN) IVPB 1000 mg/200 mL premix  Status:  Discontinued        1,000 mg 200 mL/hr over 60 Minutes Intravenous  Once 02/10/23 0436 02/10/23 0447   02/10/23 0530  ceFEPIme (MAXIPIME) 2 g in sodium chloride 0.9 % 100 mL IVPB        2 g 200 mL/hr over 30 Minutes Intravenous  Once 02/10/23 0436 02/10/23 0649   02/09/23 1745  vancomycin (VANCOCIN) IVPB 1000 mg/200 mL premix        1,000 mg 200 mL/hr over 60 Minutes Intravenous  Once 02/09/23 1738 02/09/23 2140        Medications  Scheduled Meds:  Budeson-Glycopyrrol-Formoterol  2 puff Inhalation BID    Chlorhexidine Gluconate Cloth  6 each Topical Daily   enoxaparin (LOVENOX) injection  40 mg Subcutaneous Q24H   gabapentin  200 mg Oral BID   Continuous Infusions:  ceFEPime (MAXIPIME) IV     vancomycin     PRN Meds:.acetaminophen **OR** acetaminophen, ondansetron **OR** ondansetron (ZOFRAN) IV, oxyCODONE-acetaminophen    Subjective:   [name] was seen and examined today.    Objective:   Vitals:   02/10/23 0315 02/10/23 0411 02/10/23 0412 02/10/23 0751  BP: (!) 95/58 112/70    Pulse: 80 80    Resp: 15 18    Temp:  98 F (36.7 C)    TempSrc:      SpO2: 100% 99%  98%  Weight:   65.1 kg   Height:   5\' 1"  (1.549 m)     Intake/Output Summary (Last 24 hours) at 02/10/2023 1329 Last data filed at 02/10/2023 0647 Gross per 24 hour  Intake 93.02 ml  Output --  Net 93.02 ml   Filed Weights   02/09/23 1456 02/10/23 0412  Weight: 64.9 kg 65.1 kg     Exam General exam: Appears calm and comfortable  Respiratory system: Clear to auscultation. Respiratory effort normal. Cardiovascular system: S1 & S2 heard, RRR.  Gastrointestinal system: Abdomen is nondistended, soft and nontender.  Central nervous system: Alert and oriented.  Extremities: right lower extremity is edematous, erythematous and tender to touch. LLE heel area is slightly edematous.  Skin: No rashes,  Psychiatry: Mood & affect appropriate.     Data Reviewed:  I have personally reviewed following labs and imaging studies   CBC Lab Results  Component Value Date   WBC 2.9 (L) 02/10/2023   RBC 2.07 (L) 02/10/2023   HGB 7.9 (L) 02/10/2023   HCT 23.8 (L) 02/10/2023   MCV 115.0 (H) 02/10/2023   MCH 38.2 (H) 02/10/2023   PLT 102 (L) 02/10/2023   MCHC 33.2 02/10/2023   RDW 16.4 (H) 02/10/2023   LYMPHSABS 1.6 02/09/2023   MONOABS 0.4 02/09/2023   EOSABS 0.1 02/09/2023   BASOSABS 0.0 02/09/2023     Last metabolic panel Lab Results  Component Value Date   NA 137 02/10/2023   K 3.9 02/10/2023    CL 104 02/10/2023   CO2 27 02/10/2023   BUN 16 02/10/2023   CREATININE 0.94 02/10/2023   GLUCOSE 98 02/10/2023   GFRNONAA >60 02/10/2023   CALCIUM 9.0 02/10/2023  PHOS 3.3 02/10/2023   PROT 6.3 (L) 02/10/2023   ALBUMIN 2.5 (L) 02/10/2023   BILITOT 0.3 02/10/2023   ALKPHOS 53 02/10/2023   AST 51 (H) 02/10/2023   ALT 46 (H) 02/10/2023   ANIONGAP 6 02/10/2023    CBG (last 3)  No results for input(s): "GLUCAP" in the last 72 hours.    Coagulation Profile: Recent Labs  Lab 02/04/23 1303  INR 1.0     Radiology Studies: US Venous Img Lower Unilateral Right  Result Date: 02/09/2023 CLINICAL DATA:  Redness and right leg for 2 weeks. EXAM: RIGHT LOWER EXTREMITY VENOUS DOPPLER ULTRASOUND TECHNIQUE: Gray-scale sonography with compression, as well as color and duplex ultrasound, were performed to evaluate the deep venous system(s) from the level of the common femoral vein through the popliteal and proximal calf veins. COMPARISON:  02/04/2023. FINDINGS: VENOUS Normal compressibility of the common femoral, superficial femoral, and popliteal veins, as well as the visualized calf veins. Visualized portions of profunda femoral vein and great saphenous vein unremarkable. No filling defects to suggest DVT on grayscale or color Doppler imaging. Doppler waveforms show normal direction of venous flow, normal respiratory plasticity and response to augmentation. Limited views of the contralateral common femoral vein are unremarkable. OTHER Subcutaneous edema is noted. Limitations: none IMPRESSION: Negative. Electronically Signed   By: Thornell Sartorius M.D.   On: 02/09/2023 20:40       Kathlen Mody M.D. [facility] 02/10/2023, 1:29 PM  Available via Epic secure chat 7am-7pm After 7 pm, please refer to night coverage provider listed on amion.

## 2023-02-10 NOTE — Plan of Care (Signed)
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
## 2023-02-10 NOTE — Progress Notes (Signed)
Mobility Specialist Progress Note:    02/10/23 1500  Mobility  Activity Ambulated with assistance in hallway  Level of Assistance Standby assist, set-up cues, supervision of patient - no hands on  Assistive Device Front wheel walker  Distance Ambulated (ft) 400 ft  Range of Motion/Exercises Active;All extremities  Activity Response Tolerated well  Mobility Referral Yes  $Mobility charge 1 Mobility  Mobility Specialist Start Time (ACUTE ONLY) 1445  Mobility Specialist Stop Time (ACUTE ONLY) 1510  Mobility Specialist Time Calculation (min) (ACUTE ONLY) 25 min   Pt received sitting EOB, husband in room. Eager for mobility, required supervision to stand and ambulate with RW. Tolerated well, c/o throbbing pain in RLE after ambulation. Returned pt to room, left sitting EOB. All needs met.   Lawerance Bach Mobility Specialist Please contact via Special educational needs teacher or  Rehab office at [phone]

## 2023-02-10 NOTE — Plan of Care (Signed)
  Problem: Education: Goal: Knowledge of General Education information will improve Description Including pain rating scale, medication(s)/side effects and non-pharmacologic comfort measures Outcome: Progressing   Problem: Health Behavior/Discharge Planning: Goal: Ability to manage health-related needs will improve Outcome: Progressing   

## 2023-02-10 NOTE — TOC CM/SW Note (Signed)
Transition of Care [facility]) - Inpatient Brief Assessment   Patient Details  Name: Yvette Curry MRN: [id number] Date of Birth: 1955/07/22  Transition of Care [facility]) CM/SW Contact:    Villa Herb, LCSWA Phone Number: 02/10/2023, 11:52 AM   Clinical Narrative: Transition of Care Department [facility]) has reviewed patient and no TOC needs have been identified at this time. We will continue to monitor patient advancement through interdisciplinary progression rounds. If new patient transition needs arise, please place a TOC consult.  Transition of Care Asessment: Insurance and Status: Insurance coverage has been reviewed Patient has primary care physician: Yes Home environment has been reviewed: lives with spouse Prior level of function:: mostly independent Prior/Current Home Services: No current home services Social Determinants of Health Reivew: SDOH reviewed no interventions necessary Readmission risk has been reviewed: Yes Transition of care needs: no transition of care needs at this time

## 2023-02-10 NOTE — Progress Notes (Signed)
Pharmacy Antibiotic Note  LEVERN NAGASAWA is a 67 y.o. female admitted on 02/09/2023 with cellulitis.  Recent admission for cellulitis - discharged 10/18 on cefdinir and doxycycline x 5 days. Antibiotics broadened and IV with hx of immunocompromised state (NSC lung cancer currently on chemotherapy). Pharmacy has been consulted for cefepime and vancomycin dosing.  Plan: Cefepime 2g q12h Vancomycin 1g q24h (eAUC 525, Scr 0.97) F/u renal function, length of therapy and narrow as able  Height: 5\' 1"  (154.9 cm) Weight: 65.1 kg (143 lb 9.6 oz) IBW/kg (Calculated) : 47.8  Temp (24hrs), Avg:98.1 F (36.7 C), Min:97.8 F (36.6 C), Max:98.5 F (36.9 C)  Recent Labs  Lab 02/04/23 1303 02/04/23 1609 02/05/23 0319 02/06/23 0700 02/09/23 2007  WBC 5.1  --  2.9* 2.3* 2.8*  CREATININE 1.18*  --  1.11*  --  0.97  LATICACIDVEN 1.4 1.0  --   --  1.5    Estimated Creatinine Clearance: 49.9 mL/min (by C-G formula based on SCr of 0.97 mg/dL).    Allergies  Allergen Reactions   Lidocaine Shortness Of Breath and Anxiety    Patient felt like she "couldn't breathe", panicky Allergic to all " caines"   Mepivacaine Swelling    angioedema   Demerol Nausea And Vomiting   Prednisone Hives and Nausea And Vomiting    abd pain and vomiting, Hives    Sulfa Antibiotics Hives    Hives, swelling and itching    Antimicrobials this admission: Cefepime 10/22 > Vancomycin 10/21 >  Thank you for allowing pharmacy to be a part of this patient's care.  Marja Kays 02/10/2023 4:40 AM
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
## 2023-02-11 DIAGNOSIS — Z7189 Other specified counseling: Secondary | ICD-10-CM

## 2023-02-11 DIAGNOSIS — Z515 Encounter for palliative care: Secondary | ICD-10-CM

## 2023-02-11 DIAGNOSIS — M79604 Pain in right leg: Secondary | ICD-10-CM

## 2023-02-11 DIAGNOSIS — M79605 Pain in left leg: Secondary | ICD-10-CM

## 2023-02-11 DIAGNOSIS — C3491 Malignant neoplasm of unspecified part of right bronchus or lung: Secondary | ICD-10-CM | POA: Diagnosis not present

## 2023-02-11 DIAGNOSIS — L03115 Cellulitis of right lower limb: Secondary | ICD-10-CM | POA: Diagnosis not present

## 2023-02-11 LAB — CBC WITH DIFFERENTIAL/PLATELET
Abs Immature Granulocytes: 0.1 10*3/uL — ABNORMAL HIGH (ref 0.00–0.07)
Band Neutrophils: 1 %
Basophils Absolute: 0 10*3/uL (ref 0.0–0.1)
Basophils Relative: 0 %
Eosinophils Absolute: 0.1 10*3/uL (ref 0.0–0.5)
Eosinophils Relative: 2 %
HCT: 22.1 % — ABNORMAL LOW (ref 36.0–46.0)
Hemoglobin: 7.3 g/dL — ABNORMAL LOW (ref 12.0–15.0)
Lymphocytes Relative: 58 %
Lymphs Abs: 1.5 10*3/uL (ref 0.7–4.0)
MCH: 38.2 pg — ABNORMAL HIGH (ref 26.0–34.0)
MCHC: 33 g/dL (ref 30.0–36.0)
MCV: 115.7 fL — ABNORMAL HIGH (ref 80.0–100.0)
Metamyelocytes Relative: 1 %
Monocytes Absolute: 0.4 10*3/uL (ref 0.1–1.0)
Monocytes Relative: 14 %
Myelocytes: 1 %
Neutro Abs: 0.6 10*3/uL — ABNORMAL LOW (ref 1.7–7.7)
Neutrophils Relative %: 23 %
Platelets: 102 10*3/uL — ABNORMAL LOW (ref 150–400)
RBC: 1.91 MIL/uL — ABNORMAL LOW (ref 3.87–5.11)
RDW: 16.3 % — ABNORMAL HIGH (ref 11.5–15.5)
WBC: 2.5 10*3/uL — ABNORMAL LOW (ref 4.0–10.5)
nRBC: 0 % (ref 0.0–0.2)

## 2023-02-11 LAB — SEDIMENTATION RATE: Sed Rate: >140 mm/h — ABNORMAL HIGH (ref 0–22)

## 2023-02-11 LAB — C-REACTIVE PROTEIN: CRP: 2.5 mg/dL — ABNORMAL HIGH (ref <1.0)

## 2023-02-11 MED ORDER — ORAL CARE MOUTH RINSE: 15.0000 mL | OROMUCOSAL | Status: DC | PRN

## 2023-02-11 MED ORDER — OXYCODONE HCL 5 MG PO TABS: 5.0000 mg | ORAL_TABLET | ORAL | Status: DC | PRN

## 2023-02-11 MED ORDER — MAGNESIUM HYDROXIDE 400 MG/5ML PO SUSP
45.0000 mL | Freq: Once | ORAL | Status: AC
  Administered 2023-02-11: 45 mL via ORAL
  Filled 2023-02-11: qty 60

## 2023-02-11 MED ORDER — SENNOSIDES-DOCUSATE SODIUM 8.6-50 MG PO TABS: 1.0000 | ORAL_TABLET | Freq: Every day | ORAL | Status: DC

## 2023-02-11 MED ORDER — MIDODRINE HCL 5 MG PO TABS: 2.5000 mg | ORAL_TABLET | Freq: Three times a day (TID) | ORAL | Status: DC | PRN

## 2023-02-11 MED ORDER — LIDOCAINE 4 % EX CREA
TOPICAL_CREAM | Freq: Three times a day (TID) | CUTANEOUS | Status: DC | PRN
  Filled 2023-02-11: qty 5

## 2023-02-11 NOTE — ED Provider Notes (Signed)
[facility] MEDICAL SURGICAL UNIT Provider Note   CSN: [id number] Arrival date & time: 02/09/23  1439     History  Chief Complaint  Patient presents with   Leg Swelling    Yvette Curry is a 67 y.o. female.  Patient complains of worsening swelling and redness to right lower leg.  She just was discharged from the hospital couple days ago with cellulitis to the leg  The history is provided by the patient and medical records. No language interpreter was used.  Leg Pain Lower extremity pain location: Right lower leg swelling and tenderness. Pain details:    Quality:  Aching   Radiates to:  Does not radiate   Severity:  Moderate   Onset quality:  Sudden   Timing:  Constant   Progression:  Worsening Chronicity:  New Dislocation: no   Relieved by:  Nothing Associated symptoms: no back pain and no fatigue        Home Medications Prior to Admission medications   Medication Sig Start Date End Date Taking? Authorizing Provider  acetaminophen (TYLENOL) 325 MG tablet Take 2 tablets (650 mg total) by mouth every 6 (six) hours as needed for mild pain (pain score 1-3) (or Fever >/= 101). 02/06/23  Yes Emokpae, Courage, MD  ascorbic acid (VITAMIN C) 500 MG tablet Take 500 mg by mouth daily. 03/14/19  Yes [provider]  bevacizumab (AVASTIN) 1.25 mg/0.1 mL SOLN 1.25 mg by Intravitreal route to Surgery. Into right eye for swelling behind retina   Yes [provider]  Budeson-Glycopyrrol-Formoterol (BREZTRI AEROSPHERE) 160-9-4.8 MCG/ACT AERO Inhale 2 puffs into the lungs in the morning and at bedtime. 12/18/22  Yes Icard, Bradley L, DO  carboxymethylcellulose 1 % ophthalmic solution Apply 1 drop to eye 3 (three) times daily.   Yes [provider]  cefdinir (OMNICEF) 300 MG capsule Take 1 capsule (300 mg total) by mouth 2 (two) times daily for 5 days. 02/06/23 02/11/23 Yes Emokpae, Courage, MD  doxycycline (VIBRA-TABS) 100 MG tablet Take 1 tablet (100 mg total)  by mouth 2 (two) times daily for 5 days. 02/06/23 02/11/23 Yes Emokpae, Courage, MD  ferrous sulfate 324 MG TBEC Take 1 tablet (324 mg total) by mouth daily with breakfast. 10/21/22  Yes Emokpae, Courage, MD  folic acid (FOLVITE) 1 MG tablet Take 1 tablet (1 mg total) by mouth daily. 10/21/22  Yes Emokpae, Courage, MD  midodrine (PROAMATINE) 5 MG tablet Take 1 tablet (5 mg total) by mouth 3 (three) times daily with meals. Patient taking differently: Take 5 mg by mouth 3 (three) times daily with meals. Only when the top number drops below 90 01/14/23  Yes Sharlene Dory, NP  oxyCODONE-acetaminophen (PERCOCET) 5-325 MG tablet Take 1 tablet by mouth every 8 (eight) hours as needed for severe pain (pain score 7-10). 02/06/23 02/06/24 Yes Emokpae, Courage, MD  prochlorperazine (COMPAZINE) 10 MG tablet Take 10 mg by mouth every 6 (six) hours as needed for nausea or vomiting.   Yes [provider]  Blood Pressure Monitoring (OMRON 3 SERIES BP MONITOR) DEVI Use as directed 12/01/22   Sharlene Dory, NP  Spacer/Aero-Holding Chambers (AEROCHAMBER MV) inhaler Use as instructed 12/18/22   Icard, Rachel Bo, DO      Allergies    Lidocaine, Mepivacaine, Demerol, Prednisone, and Sulfa antibiotics    Review of Systems   Review of Systems  Constitutional:  Negative for appetite change and fatigue.  HENT:  Negative for congestion, ear discharge and sinus pressure.  Eyes:  Negative for discharge.  Respiratory:  Negative for cough.   Cardiovascular:  Negative for chest pain.  Gastrointestinal:  Negative for abdominal pain and diarrhea.  Genitourinary:  Negative for frequency and hematuria.  Musculoskeletal:  Negative for back pain.  Skin:  Positive for rash.       Swelling and redness to right lower leg  Neurological:  Negative for seizures and headaches.  Psychiatric/Behavioral:  Negative for hallucinations.     Physical Exam Updated Vital Signs BP 101/69 (BP Location: Left Arm)   Pulse 91   Temp  97.8 F (36.6 C)   Resp 18   Ht 5\' 1"  (1.549 m)   Wt 65.1 kg   SpO2 95%   BMI 27.13 kg/m  Physical Exam Vitals and nursing note reviewed.  Constitutional:      Appearance: She is well-developed.  HENT:     Head: Normocephalic.     Nose: Nose normal.  Eyes:     General: No scleral icterus.    Conjunctiva/sclera: Conjunctivae normal.  Neck:     Thyroid: No thyromegaly.  Cardiovascular:     Rate and Rhythm: Normal rate and regular rhythm.     Heart sounds: No murmur heard.    No friction rub. No gallop.  Pulmonary:     Breath sounds: No stridor. No wheezing or rales.  Chest:     Chest wall: No tenderness.  Abdominal:     General: There is no distension.     Tenderness: There is no abdominal tenderness. There is no rebound.  Musculoskeletal:     Cervical back: Neck supple.     Comments: Tender swollen red right lower leg  Lymphadenopathy:     Cervical: No cervical adenopathy.  Skin:    Findings: No erythema or rash.  Neurological:     Mental Status: She is alert and oriented to person, place, and time.     Motor: No abnormal muscle tone.     Coordination: Coordination normal.  Psychiatric:        Behavior: Behavior normal.     ED Results / Procedures / Treatments   Labs (all labs ordered are listed, but only abnormal results are displayed) Labs Reviewed  CBC WITH DIFFERENTIAL/PLATELET - Abnormal; Notable for the following components:      Result Value   WBC 2.8 (*)    RBC 2.13 (*)    Hemoglobin 8.1 (*)    HCT 24.3 (*)    MCV 114.1 (*)    MCH 38.0 (*)    RDW 16.2 (*)    Platelets 103 (*)    Neutro Abs 0.6 (*)    All other components within normal limits  COMPREHENSIVE METABOLIC PANEL - Abnormal; Notable for the following components:   Glucose, Bld 117 (*)    Albumin 2.6 (*)    AST 58 (*)    ALT 48 (*)    All other components within normal limits  CK - Abnormal; Notable for the following components:   Total CK 20 (*)    All other components within  normal limits  COMPREHENSIVE METABOLIC PANEL - Abnormal; Notable for the following components:   Total Protein 6.3 (*)    Albumin 2.5 (*)    AST 51 (*)    ALT 46 (*)    All other components within normal limits  CBC - Abnormal; Notable for the following components:   WBC 2.9 (*)    RBC 2.07 (*)    Hemoglobin 7.9 (*)  HCT 23.8 (*)    MCV 115.0 (*)    MCH 38.2 (*)    RDW 16.4 (*)    Platelets 102 (*)    All other components within normal limits  C-REACTIVE PROTEIN - Abnormal; Notable for the following components:   CRP 2.5 (*)    All other components within normal limits  SEDIMENTATION RATE - Abnormal; Notable for the following components:   Sed Rate >140 (*)    All other components within normal limits  CBC WITH DIFFERENTIAL/PLATELET - Abnormal; Notable for the following components:   WBC 2.5 (*)    RBC 1.91 (*)    Hemoglobin 7.3 (*)    HCT 22.1 (*)    MCV 115.7 (*)    MCH 38.2 (*)    RDW 16.3 (*)    Platelets 102 (*)    Neutro Abs 0.6 (*)    Abs Immature Granulocytes 0.10 (*)    All other components within normal limits  LACTIC ACID, PLASMA  FOLATE  VITAMIN B12  MAGNESIUM  PHOSPHORUS    EKG None  Radiology US ARTERIAL ABI (SCREENING LOWER EXTREMITY)  Result Date: 02/10/2023 CLINICAL DATA:  Bilateral calf and foot pain, right greater than left. Bilateral medial ankle wounds. History of hyperlipidemia and lung carcinoma. EXAM: NONINVASIVE PHYSIOLOGIC VASCULAR STUDY OF BILATERAL LOWER EXTREMITIES TECHNIQUE: Evaluation of both lower extremities were performed at rest, including calculation of ankle-brachial indices with single level Doppler, pressure and pulse volume recording. COMPARISON:  None Available. FINDINGS: Right ABI:  1.17 Left ABI:  1.03 Right Lower Extremity: Normal triphasic arterial waveforms at the ankle. Left Lower Extremity: Normal triphasic posterior tibial artery waveform. The dorsalis pedis waveform is monophasic. 1.0-1.4 Normal IMPRESSION: Normal  resting ankle-brachial indices. Distal waveform analysis demonstrates some component of disease likely involving the left anterior tibial and/or dorsalis pedis artery. Electronically Signed   By: Irish Lack M.D.   On: 02/10/2023 14:49   US Venous Img Lower Unilateral Right  Result Date: 02/09/2023 CLINICAL DATA:  Redness and right leg for 2 weeks. EXAM: RIGHT LOWER EXTREMITY VENOUS DOPPLER ULTRASOUND TECHNIQUE: Gray-scale sonography with compression, as well as color and duplex ultrasound, were performed to evaluate the deep venous system(s) from the level of the common femoral vein through the popliteal and proximal calf veins. COMPARISON:  02/04/2023. FINDINGS: VENOUS Normal compressibility of the common femoral, superficial femoral, and popliteal veins, as well as the visualized calf veins. Visualized portions of profunda femoral vein and great saphenous vein unremarkable. No filling defects to suggest DVT on grayscale or color Doppler imaging. Doppler waveforms show normal direction of venous flow, normal respiratory plasticity and response to augmentation. Limited views of the contralateral common femoral vein are unremarkable. OTHER Subcutaneous edema is noted. Limitations: none IMPRESSION: Negative. Electronically Signed   By: Thornell Sartorius M.D.   On: 02/09/2023 20:40    Procedures Procedures    Medications Ordered in ED Medications  enoxaparin (LOVENOX) injection 40 mg (40 mg Subcutaneous Given 02/11/23 0956)  acetaminophen (TYLENOL) tablet 650 mg (650 mg Oral Given 02/10/23 1319)    Or  acetaminophen (TYLENOL) suppository 650 mg ( Rectal See Alternative 02/10/23 1319)  ondansetron (ZOFRAN) tablet 4 mg ( Oral See Alternative 02/11/23 0936)    Or  ondansetron (ZOFRAN) injection 4 mg (4 mg Intravenous Given 02/11/23 0936)  Budeson-Glycopyrrol-Formoterol 160-9-4.8 MCG/ACT AERO 2 puff (2 puffs Inhalation Given 02/11/23 0840)  Chlorhexidine Gluconate Cloth 2 % PADS 6 each (6 each Topical  Given 02/11/23 0956)  vancomycin (  VANCOCIN) IVPB 1000 mg/200 mL premix (1,000 mg Intravenous New Bag/Given 02/10/23 1837)  ceFEPIme (MAXIPIME) 2 g in sodium chloride 0.9 % 100 mL IVPB (2 g Intravenous New Bag/Given 02/11/23 0610)  gabapentin (NEURONTIN) capsule 200 mg (200 mg Oral Not Given 02/11/23 0957)  midodrine (PROAMATINE) tablet 5 mg (5 mg Oral Not Given 02/11/23 1143)  folic acid (FOLVITE) tablet 1 mg (1 mg Oral Given 02/11/23 0833)  ferrous sulfate tablet 324 mg (324 mg Oral Given 02/11/23 0832)  ascorbic acid (VITAMIN C) tablet 500 mg (500 mg Oral Given 02/11/23 0835)  polyvinyl alcohol (LIQUIFILM TEARS) 1.4 % ophthalmic solution 1 drop (1 drop Both Eyes Not Given 02/11/23 0835)  Oral care mouth rinse (has no administration in time range)  oxyCODONE (Oxy IR/ROXICODONE) immediate release tablet 5 mg (has no administration in time range)  lidocaine (LMX) 4 % cream (has no administration in time range)  vancomycin (VANCOCIN) IVPB 1000 mg/200 mL premix (0 mg Intravenous Stopped 02/09/23 2140)  ondansetron (ZOFRAN) injection 4 mg (4 mg Intravenous Given 02/09/23 2044)  HYDROmorphone (DILAUDID) injection 0.5 mg (0.5 mg Intravenous Given 02/09/23 2050)  ceFEPIme (MAXIPIME) 2 g in sodium chloride 0.9 % 100 mL IVPB ( Intravenous Infusion Verify 02/10/23 2376)    ED Course/ Medical Decision Making/ A&P                                 Medical Decision Making Risk Prescription drug management. Decision regarding hospitalization.   Ultrasound right lower leg does not show a DVT.  Patient has worsening cellulitis to right lower leg.  She will be admitted for antibiotics and will be admitted by the hospitalist service        Final Clinical Impression(s) / ED Diagnoses Final diagnoses:  None    Rx / DC Orders ED Discharge Orders     None         Bethann Berkshire, MD 02/11/23 1308

## 2023-02-11 NOTE — Progress Notes (Signed)
Mobility Specialist Progress Note:    02/11/23 1400  Mobility  Activity Ambulated with assistance in hallway  Level of Assistance Contact guard assist, steadying assist  Assistive Device Front wheel walker  Distance Ambulated (ft) 100 ft  Range of Motion/Exercises Active;All extremities  Activity Response Tolerated well  Mobility Referral Yes  $Mobility charge 1 Mobility  Mobility Specialist Start Time (ACUTE ONLY) 1400  Mobility Specialist Stop Time (ACUTE ONLY) 1420  Mobility Specialist Time Calculation (min) (ACUTE ONLY) 20 min   Pt received in bathroom, husband in room. Agreeable to mobility, required CGA to stand and ambulate with RW. Tolerated well, pt feels "woozy" and "off" d/t medicine taken earlier. Took frequent breaks during ambulation and notified nurse. Returned pt to room, left supine. All needs met.   Lawerance Bach Mobility Specialist Please contact via Special educational needs teacher or  Rehab office at [phone]

## 2023-02-11 NOTE — Consult Note (Incomplete)
Consultation Note Date: 02/11/2023   Patient Name: Yvette Curry  DOB: [birth year]  MRN: [id number]  Age / Sex: 67 y.o., female  PCP: [name], DO Referring Physician: Jonah Blue, MD  Reason for Consultation: {Reason for Consult:23484}  HPI/Patient Profile: 67 y.o. female  with past medical history of *** admitted on 02/09/2023 with ***.   Clinical Assessment and Goals of Care: ***  Primary Decision Maker {Primary Decision EPPIR:51884}    SUMMARY OF RECOMMENDATIONS   ***  Code Status/Advance Care Planning: {Palliative Code status:23503}   Symptom Management:  ***  Palliative Prophylaxis:  {Palliative Prophylaxis:21015}  Additional Recommendations (Limitations, Scope, Preferences): {Recommended Scope and Preferences:21019}  Psycho-social/Spiritual:  Desire for further Chaplaincy support:{YES NO:22349} Additional Recommendations: {PAL SOCIAL:21064}  Prognosis:  {Palliative Care Prognosis:23504}  Discharge Planning: {Palliative dispostion:23505}      Primary Diagnoses: Present on Admission:  Cellulitis of right lower extremity  Macrocytic anemia  Pancytopenia (HCC)  Adenocarcinoma of right lung, stage 4 (HCC)  HFrEF (heart failure with reduced ejection fraction) (HCC)  Emphysema lung (HCC)   I have reviewed the medical record, interviewed the patient and family, and examined the patient. The following aspects are pertinent.  Past Medical History:  Diagnosis Date   Anxiety    no current tx   Depression    no meds at present   Dyspnea    Family history of adverse reaction to anesthesia    pt states mom had allergic reaction to some unknown anesthesia   GERD (gastroesophageal reflux disease)    no tx since weight loss   Hypercholesteremia    Osteoarthritis    stage IV lung ca 11/2021   Social History   Socioeconomic History   Marital status: Married     Spouse name: Adela Lank   Number of children: Y   Years of education: Not on file   Highest education level: Not on file  Occupational History   Occupation: Personnel officer: UNEMPLOYED  Tobacco Use   Smoking status: Every Day    Current packs/day: 0.50    Average packs/day: 0.5 packs/day for 37.0 years (18.5 ttl pk-yrs)    Types: Cigarettes    Passive exposure: Current   Smokeless tobacco: Never   Tobacco comments:    Smokes half a pack of cigarettes a day. 12/18/2022 Tay  Vaping Use   Vaping status: Former  Substance and Sexual Activity   Alcohol use: No   Drug use: No   Sexual activity: Yes    Comment: hysterectomy  Other Topics Concern   Not on file  Social History Narrative   Not on file   Social Determinants of Health   Financial Resource Strain: Not on file  Food Insecurity: No Food Insecurity (02/10/2023)   Hunger Vital Sign    Worried About Running Out of Food in the Last Year: Never true    Ran Out of Food in the Last Year: Never true  Transportation Needs: No Transportation Needs (02/10/2023)   PRAPARE - Transportation  Lack of Transportation (Medical): No    Lack of Transportation (Non-Medical): No  Physical Activity: Not on file  Stress: Not on file  Social Connections: Not on file   Family History  Problem Relation Age of Onset   Heart disease Mother    Kidney cancer Mother    Emphysema Father    Heart disease Father    Colon cancer Neg Hx    Scheduled Meds:  ascorbic acid  500 mg Oral Daily   Budeson-Glycopyrrol-Formoterol  2 puff Inhalation BID   Chlorhexidine Gluconate Cloth  6 each Topical Daily   enoxaparin (LOVENOX) injection  40 mg Subcutaneous Q24H   ferrous sulfate  324 mg Oral Q breakfast   folic acid  1 mg Oral Daily   gabapentin  200 mg Oral BID   midodrine  5 mg Oral TID WC   polyvinyl alcohol  1 drop Both Eyes TID   Continuous Infusions:  ceFEPime (MAXIPIME) IV 2 g (02/11/23 0610)   vancomycin 1,000 mg (02/10/23 1837)   PRN  Meds:.acetaminophen **OR** acetaminophen, lidocaine, ondansetron **OR** ondansetron (ZOFRAN) IV, mouth rinse, oxyCODONE Allergies  Allergen Reactions   Lidocaine Shortness Of Breath and Anxiety    Patient felt like she "couldn't breathe", panicky Allergic to all " caines"   Mepivacaine Swelling    angioedema   Demerol Nausea And Vomiting   Prednisone Hives and Nausea And Vomiting    abd pain and vomiting, Hives    Sulfa Antibiotics Hives    Hives, swelling and itching   Review of Systems  Physical Exam  Vital Signs: BP 112/76 (BP Location: Right Arm)   Pulse 70   Temp 97.8 F (36.6 C)   Resp 18   Ht 5\' 1"  (1.549 m)   Wt 65.1 kg   SpO2 100%   BMI 27.13 kg/m  Pain Scale: 0-10   Pain Score: 5    SpO2: SpO2: 100 % O2 Device:SpO2: 100 % O2 Flow Rate: .O2 Flow Rate (L/min): 2 L/min  IO: Intake/output summary:  Intake/Output Summary (Last 24 hours) at 02/11/2023 1537 Last data filed at 02/11/2023 1300 Gross per 24 hour  Intake 982.1 ml  Output --  Net 982.1 ml    LBM: Last BM Date : 02/08/23 Baseline Weight: Weight: 64.9 kg Most recent weight: Weight: 65.1 kg     Palliative Assessment/Data:     Time In: *** Time Out: *** Time Total: *** Greater than 50%  of this time was spent counseling and coordinating care related to the above assessment and plan.  Signed by: Yong Channel, NP Palliative Medicine Team Pager # [phone] (M-F 8a-5p) Team Phone # [phone] (Nights/Weekends)

## 2023-02-11 NOTE — Plan of Care (Signed)
  Problem: Health Behavior/Discharge Planning: Goal: Ability to manage health-related needs will improve Outcome: Progressing   Problem: Activity: Goal: Risk for activity intolerance will decrease Outcome: Progressing   Problem: Pain Managment: Goal: General experience of comfort will improve Outcome: Progressing   Problem: Safety: Goal: Ability to remain free from injury will improve Outcome: Progressing   Problem: Skin Integrity: Goal: Risk for impaired skin integrity will decrease Outcome: Progressing

## 2023-02-11 NOTE — Progress Notes (Signed)
   02/11/23 1210  Spiritual Encounters  Type of Visit Follow up  Care provided to: Pt not available  Reason for visit Routine spiritual support  OnCall Visit No   Chaplain attempted to visit with patient during rounding on the unit. The patient, Yvette Curry was receiving. I will attempt to visit with her another day.   Valerie Roys [facility]  [phone]

## 2023-02-11 NOTE — Hospital Course (Addendum)
66yo with h/o HTN, chronic systolic CHF, stage 4 lung cancer, and anxiety/depression who presented on 10/21 with RLE pain and erythema.  She was previously admitted from 10/16-18 with RLE cellulitis that was treated with Ceftriaxone/Vanc -> Omnicef/Doxycycline.  Blood cultures negative x 5 days.  DVT US negative.  Diagnosed with recurrent cellulitis, started on Vancomycin and Cefepime with improvement.

## 2023-02-11 NOTE — Progress Notes (Signed)
Progress Note   Patient: Yvette Curry GLO:[id number] DOB: 1955-11-22 DOA: 02/09/2023     2 DOS: the patient was seen and examined on 02/11/2023   Brief hospital course: 66yo with h/o HTN, chronic systolic CHF, stage 4 lung cancer, and anxiety/depression who presented on 10/21 with RLE pain and erythema.  She was previously admitted from 10/16-18 with RLE cellulitis that was treated with Ceftriaxone/Vanc -> Omnicef/Doxycycline.  Blood cultures negative x 5 days.  DVT US negative.  Diagnosed with recurrent cellulitis, started on Vancomycin.    Assessment and Plan:  Refactory CELLULITIS in the right lower extremity Also with some signs of cellulitis with pain and tenderness in the left lower extremity.  In the setting of venous insufficiency of both extremities Venous duplex of the lower extremity is negative for DVT Differential include neuropathy from the chemotherapy vs PVD vs inflammatory conditions She was seen in 09/2022 by vascular surgeon for venous insufficiency and recommended compression stockings (she reports inability to wear due to pain) Recommend outpatient follow up with vascular surgery (sees Dr. Myra Gianotti) Recommend starting her gabapentin 200 mg BID  She is very hesitant to start any stronger pain meds, has oxy for breakthrough pain  Stage 4 lung cancer Followed by Dr. Arbutus Ped Treated with carboplatin and then Alimta and Keytruda Recently underwent palliative radiation for RUL nodule No evidence of progression on recent imaging Stasis of LE does not appear to be related to bulky pelvic mets but repeat imaging could be performed if not improving   Pancytopenia From chemo on 01/29/23 Monitor and transfuse as needed   Elevated liver enzymes Stable currently but normal earlier this month Possible from hepatic steatosis vs. 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 The score is based on dignosis, age, lab data, medications, orders, and past utilization.   Low:  0-14.9   Medium: 15-21.9   High: 22-29.9   Extreme: 30 and above           Subjective: Feeling some better today. Percocet + Gabapentin made her vomit and loopy but pain and swelling are improving.   Objective: Vitals:   02/11/23 0502 02/11/23 1402  BP: 101/69 112/76  Pulse: 91 70  Resp: 18 18  Temp: 97.8 F (36.6 C)   SpO2: 95% 100%    Intake/Output Summary (Last 24 hours) at 02/11/2023 1532 Last data filed at 02/11/2023 1300 Gross per 24 hour  Intake 982.1 ml  Output --  Net 982.1 ml   Filed Weights   02/09/23 1456 02/10/23 0412  Weight: 64.9 kg 65.1 kg    Exam:  General:  Appears calm and comfortable and is in NAD Eyes:   EOMI, normal lids, iris ENT:  grossly normal hearing, lips & tongue, mmm Neck:  no  LAD, masses or thyromegaly Cardiovascular:  RRR, no m/r/g. 1+ LE edema.  Respiratory:   CTA bilaterally with no wheezes/rales/rhonchi.  Normal respiratory effort. Abdomen:  soft, NT, ND Skin:  evidence of stasis with brawny changes on BLE; improving erythema that is not severely inflamed and appears to be improving within the line markings Musculoskeletal:  grossly normal tone BUE/BLE, good ROM, no bony  abnormality Psychiatric:  grossly normal mood and affect, speech fluent and appropriate, AOx3 Neurologic:  CN 2-12 grossly intact, moves all extremities in coordinated fashion  Data Reviewed: I have reviewed the patient's lab results since admission.  Pertinent labs for today include:   WBC 2.5 Hgb 7.3, down from 8.1 on 10/22 Platelets 102     Family Communication: Husband was present throughout evaluation  Disposition: Status is: Inpatient Remains inpatient appropriate because: ongoing evaluation and treatment     Time spent: 50 minutes  Unresulted Labs (From admission, onward)     Start     Ordered   02/12/23 0500  CBC with Differential/Platelet  Tomorrow morning,   R        02/11/23 1108   02/12/23 0500  Basic metabolic panel  Tomorrow morning,   R        02/11/23 1108             Author: Jonah Blue, MD 02/11/2023 3:32 PM  For on call review [email or web address].

## 2023-02-12 ENCOUNTER — Inpatient Hospital Stay (HOSPITAL_COMMUNITY): Payer: Medicare Other

## 2023-02-12 DIAGNOSIS — Z7189 Other specified counseling: Secondary | ICD-10-CM | POA: Diagnosis not present

## 2023-02-12 DIAGNOSIS — C3491 Malignant neoplasm of unspecified part of right bronchus or lung: Secondary | ICD-10-CM | POA: Diagnosis not present

## 2023-02-12 DIAGNOSIS — L03115 Cellulitis of right lower limb: Secondary | ICD-10-CM | POA: Diagnosis not present

## 2023-02-12 DIAGNOSIS — Z515 Encounter for palliative care: Secondary | ICD-10-CM | POA: Diagnosis not present

## 2023-02-12 LAB — CBC WITH DIFFERENTIAL/PLATELET
Abs Immature Granulocytes: 0.04 10*3/uL (ref 0.00–0.07)
Basophils Absolute: 0 10*3/uL (ref 0.0–0.1)
Basophils Relative: 0 %
Eosinophils Absolute: 0 10*3/uL (ref 0.0–0.5)
Eosinophils Relative: 1 %
HCT: 21.5 % — ABNORMAL LOW (ref 36.0–46.0)
Hemoglobin: 7.1 g/dL — ABNORMAL LOW (ref 12.0–15.0)
Immature Granulocytes: 1 %
Lymphocytes Relative: 34 %
Lymphs Abs: 1.2 10*3/uL (ref 0.7–4.0)
MCH: 38.6 pg — ABNORMAL HIGH (ref 26.0–34.0)
MCHC: 33 g/dL (ref 30.0–36.0)
MCV: 116.8 fL — ABNORMAL HIGH (ref 80.0–100.0)
Monocytes Absolute: 0.8 10*3/uL (ref 0.1–1.0)
Monocytes Relative: 24 %
Neutro Abs: 1.3 10*3/uL — ABNORMAL LOW (ref 1.7–7.7)
Neutrophils Relative %: 40 %
Platelets: 112 10*3/uL — ABNORMAL LOW (ref 150–400)
RBC: 1.84 MIL/uL — ABNORMAL LOW (ref 3.87–5.11)
RDW: 16.6 % — ABNORMAL HIGH (ref 11.5–15.5)
WBC: 3.4 10*3/uL — ABNORMAL LOW (ref 4.0–10.5)
nRBC: 0 % (ref 0.0–0.2)

## 2023-02-12 LAB — BASIC METABOLIC PANEL
Anion gap: 7 (ref 5–15)
BUN: 20 mg/dL (ref 8–23)
CO2: 27 mmol/L (ref 22–32)
Calcium: 8.6 mg/dL — ABNORMAL LOW (ref 8.9–10.3)
Chloride: 103 mmol/L (ref 98–111)
Creatinine, Ser: 1.19 mg/dL — ABNORMAL HIGH (ref 0.44–1.00)
GFR, Estimated: 51 mL/min — ABNORMAL LOW (ref >60)
Glucose, Bld: 95 mg/dL (ref 70–99)
Potassium: 3.8 mmol/L (ref 3.5–5.1)
Sodium: 137 mmol/L (ref 135–145)

## 2023-02-12 LAB — PREPARE RBC (CROSSMATCH)

## 2023-02-12 MED ORDER — SENNOSIDES-DOCUSATE SODIUM 8.6-50 MG PO TABS
1.0000 | ORAL_TABLET | Freq: Every day | ORAL | Status: DC
  Administered 2023-02-12: 1 via ORAL
  Filled 2023-02-12: qty 1

## 2023-02-12 MED ORDER — SODIUM CHLORIDE 0.9% IV SOLUTION
Freq: Once | INTRAVENOUS | Status: AC
  Administered 2023-02-12: 10 mL via INTRAVENOUS

## 2023-02-12 MED ORDER — MAGNESIUM HYDROXIDE 400 MG/5ML PO SUSP: 30.0000 mL | Freq: Every day | ORAL | Status: DC | PRN

## 2023-02-12 MED ORDER — OXYCODONE HCL 5 MG PO TABS: 2.5000 mg | ORAL_TABLET | ORAL | Status: DC | PRN

## 2023-02-12 MED ORDER — ACETAMINOPHEN 500 MG PO TABS: 1000.0000 mg | ORAL_TABLET | Freq: Three times a day (TID) | ORAL | Status: DC | PRN

## 2023-02-12 MED ORDER — BISACODYL 5 MG PO TBEC: 5.0000 mg | DELAYED_RELEASE_TABLET | Freq: Every day | ORAL | Status: DC | PRN

## 2023-02-12 MED ORDER — POLYETHYLENE GLYCOL 3350 17 G PO PACK
17.0000 g | PACK | Freq: Every day | ORAL | Status: DC
  Administered 2023-02-12: 17 g via ORAL
  Filled 2023-02-12 (×2): qty 1

## 2023-02-12 MED ORDER — MAGNESIUM HYDROXIDE 400 MG/5ML PO SUSP: 45.0000 mL | Freq: Every day | ORAL | Status: DC | PRN

## 2023-02-12 MED ORDER — MIDODRINE HCL 5 MG PO TABS: 2.5000 mg | ORAL_TABLET | Freq: Three times a day (TID) | ORAL | Status: DC | PRN

## 2023-02-12 MED ORDER — DOCUSATE SODIUM 100 MG PO CAPS
100.0000 mg | ORAL_CAPSULE | Freq: Two times a day (BID) | ORAL | Status: DC
  Administered 2023-02-12 – 2023-02-13 (×3): 100 mg via ORAL
  Filled 2023-02-12 (×3): qty 1

## 2023-02-12 NOTE — Progress Notes (Signed)
Aero is patient's home medication.  She takes as prescribed and has it at bedside.

## 2023-02-12 NOTE — Progress Notes (Signed)
Mobility Specialist Progress Note:    02/12/23 1032  Mobility  Activity Ambulated with assistance in hallway  Level of Assistance Standby assist, set-up cues, supervision of patient - no hands on  Assistive Device Front wheel walker  Distance Ambulated (ft) 1000 ft  Range of Motion/Exercises Active;All extremities  Activity Response Tolerated well  Mobility Referral Yes  $Mobility charge 1 Mobility  Mobility Specialist Start Time (ACUTE ONLY) 1010  Mobility Specialist Stop Time (ACUTE ONLY) 1030  Mobility Specialist Time Calculation (min) (ACUTE ONLY) 20 min   Pt received in bed, husband in room. Eager for mobility, required supervision to stand and ambulate with RW. Tolerated well, asx throughout. Returned pt to room, left sitting EOB. All needs met.   Lawerance Bach Mobility Specialist Please contact via Special educational needs teacher or  Rehab office at [phone]

## 2023-02-12 NOTE — Progress Notes (Signed)
Progress Note   Patient: Yvette Curry ZOX:[id number] DOB: 03/24/1956 DOA: 02/09/2023     3 DOS: the patient was seen and examined on 02/12/2023   Brief hospital course: 66yo with h/o HTN, chronic systolic CHF, stage 4 lung cancer, and anxiety/depression who presented on 10/21 with RLE pain and erythema.  She was previously admitted from 10/16-18 with RLE cellulitis that was treated with Ceftriaxone/Vanc -> Omnicef/Doxycycline.  Blood cultures negative x 5 days.  DVT US negative.  Diagnosed with recurrent cellulitis, started on Vancomycin.    Assessment and Plan:  Refactory CELLULITIS in the right lower extremity Also with some signs of cellulitis with pain and tenderness in the left lower extremity.  In the setting of venous insufficiency of both extremities Venous duplex of the lower extremity is negative for DVT Differential include neuropathy from the chemotherapy vs PVD vs inflammatory conditions She was seen in 09/2022 by vascular surgeon for venous insufficiency and recommended compression stockings (she reports inability to wear due to pain) Recommend outpatient follow up with vascular surgery (sees Dr. Myra Gianotti) Started gabapentin 200 mg BID  She is very hesitant to take stronger pain meds, has oxy for breakthrough pain Plan to transition tomorrow to cefadroxil 1000 mg BID and doxycyline 100 mg BID x 7 days and discharge Continue to keep legs elevated when possible   Stage 4 lung cancer Followed by Dr. Arbutus Ped Treated with carboplatin and then Alimta and Keytruda Recently underwent palliative radiation for RUL nodule No evidence of progression on recent imaging Stasis of LE does not appear to be related to bulky pelvic mets but repeat imaging could be performed if not improving   Pancytopenia From chemo on 01/29/23 Monitor and transfuse as needed Hgb has dropped to 7.1 and she is extremely eager to go home tomorrow - has family coming to visit She is in agreement with  transfusion of 1 unit PRBC today Recheck CBC in AM   Elevated liver enzymes Stable currently but normal earlier this month Possible from hepatic steatosis vs. 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 The score is based on dignosis, age, lab data, medications, orders, and past utilization.   Low:  0-14.9   Medium: 15-21.9   High: 22-29.9   Extreme: 30 and above           Subjective: Feeling better today, tempted to go home but agrees to stay one more day.  Sitting cross legged, which clearly indicates improvement  since she was previously unable to tolerate even a sheet touching her leg.  Objective: Vitals:   02/12/23 0509 02/12/23 1331  BP: (!) 92/55 103/67  Pulse: 90 66  Resp: 20 18  Temp: 98.1 F (36.7 C) 98.3 F (36.8 C)  SpO2: 93% 98%    Intake/Output Summary (Last 24 hours) at 02/12/2023 1528 Last data filed at 02/12/2023 1300 Gross per 24 hour   Intake 1080 ml  Output --  Net 1080 ml   Filed Weights   02/09/23 1456 02/10/23 0412  Weight: 64.9 kg 65.1 kg    Exam:  General:  Appears calm and comfortable and is in NAD Eyes:   EOMI, normal lids, iris ENT:  grossly normal hearing, lips & tongue, mmm Neck:  no LAD, masses or thyromegaly Cardiovascular:  RRR, no m/r/g. 1+ LE edema.  Respiratory:   CTA bilaterally with no wheezes/rales/rhonchi.  Normal respiratory effort. Abdomen:  soft, NT, ND Skin:  evidence of stasis with brawny changes on BLE; improving erythema compared to yesterday Musculoskeletal:  grossly normal tone BUE/BLE, good ROM, no bony abnormality Psychiatric:  grossly normal mood and affect, speech fluent and appropriate, AOx3 Neurologic:  CN 2-12 grossly intact, moves all extremities in coordinated fashion  Data Reviewed: I have reviewed the patient's lab results since admission.  Pertinent labs for today include:   BUN 20/Creatinine 1.19/GFR 51 WBC 3.4 Hgb 7.1 Platelets 112 ESR >140    Family Communication: Husband was present throughout evaluation  Disposition: Status is: Inpatient Remains inpatient appropriate because: ongoing IV antibiotics x 1 more day     Time spent: 50 minutes  Unresulted Labs (From admission, onward)     Start     Ordered   02/13/23 0500  CBC with Differential/Platelet  Tomorrow morning,   R        02/12/23 1521   02/13/23 0500  Comprehensive metabolic panel  Tomorrow morning,   R        02/12/23 1523   02/12/23 1522  Type and screen [facility]  (Blood Administration Adult)  Once,   R       Comments[facility]ricia Boss [facility]    02/12/23 1521   02/12/23 1522  Prepare RBC (crossmatch)  (Blood Administration Adult)  Once,   R       Question Answer Comment  # of Units 1 unit   Transfusion Indications Hemoglobin < 7 gm/dL and symptomatic   Number of Units to Keep Ahead NO units ahead   If emergent release call blood bank Not emergent release      02/12/23 1521              Author: Jonah Blue, MD 02/12/2023 3:28 PM  For on call review [email or web address].

## 2023-02-12 NOTE — Progress Notes (Signed)
Palliative:  HPI: 67 y.o. female  with past medical history of HFmrEF EF 45-50%, stage IV lung cancer (diagnosed Aug 2023; follows with Dr. Shirline Frees), anxiety, depression admitted on 02/09/2023 with recurrent cellulitis.   I met today with Sana and her husband at bedside. We spent time reviewing her status and progress. She is very tired but has had a full morning and able to walk 7 laps on unit! She continues to endorse pain in lower leg R > L but more functional than she was previously. Pain 4/10 currently. We discussed plan to continue gabapentin, PRN Tylenol 1000 mg, and then OxyIR PRN - can even trial 2.5 mg dosage if too sedate with OxyIR + gabapentin on board. We also discussed ongoing constipation and orders are in place. Plans for follow up with oncology next Wednesday as scheduled. They are hoping for referral to dermatologist who specializes in oncology patients and issues. Hopes for pain control and improvement with treatment of cellulitis. Hopes for improvement to tolerate compression hose. Discussed increasing activity slowly and elevation of legs.   Update: I was called back by [facility] who has some questions for me. She has concerns about CXR and we reviewed and I discussed with her the importance of utilizing incentive spirometry and pulmonary hygiene - turn, deep breathes, cough. We also discussed the potential of blood transfusion. Leeloo agres with blood transfusion as indicated. She is concerned about having to use restroom after laxatives today with blood transfusion. We discussed taking IV pump into restroom and BSC as plan B if she needs to move quickly. RN can assist her with planning ahead for this.   All questions/concerns addressed. Emotional support provided.   Physical Exam Vitals and nursing note reviewed.  Constitutional:      General: She is not in acute distress.    Appearance: Normal appearance. She is well-developed and well-groomed.  Cardiovascular:     Rate and  Rhythm: Normal rate.  Pulmonary:     Effort: No tachypnea, accessory muscle usage or respiratory distress.     Comments: Breathing regular, unlabored on room air at rest Abdominal:     General: Abdomen is flat.     Comments: LBM 10/20  Skin:    Comments: RLE with edema, redness, darkening discoloration mostly below knee LLE with darkening discoloration and edema at ankle level  Neurological:     Mental Status: She is alert.   Plan: - Follow up with outpatient palliative care [facility] Lowella Bandy) [place] NP at [facility] - referral made.  - BLE pain neuropathic and likely component of muscular pain (R posterior calf with "charlie horse" constant pain):  Continue gabapentin 200 mg BID. May titrate as indicated and tolerated.  May consider Lyrica as alternative if continued side effects from gabapentin problematic. May also consider adding Cymbalta to regimen.  Continue oxycodone PRN - may consider even half dose 2.5 mg if 5 mg too sedating in addition to gabapentin.  - Bowel Regimen: Continue daily Miralax and colace. Recomment daily senokot to help with more consistent BM especially in the setting of requiring increased pain medication and decreased activity secondary to pain.   80 min  Yong Channel, NP Palliative Medicine Team Pager [phone] (Please see [email or web address] for schedule) Team Phone [phone]    Greater than 50%  of this time was spent counseling and coordinating care related to the above assessment and plan

## 2023-02-12 NOTE — Plan of Care (Signed)

## 2023-02-12 NOTE — Plan of Care (Signed)
  Problem: Education: Goal: Knowledge of General Education information will improve Description Including pain rating scale, medication(s)/side effects and non-pharmacologic comfort measures Outcome: Progressing   

## 2023-02-13 ENCOUNTER — Ambulatory Visit: Payer: Medicare Other | Admitting: Nurse Practitioner

## 2023-02-13 DIAGNOSIS — L03115 Cellulitis of right lower limb: Secondary | ICD-10-CM | POA: Diagnosis not present

## 2023-02-13 LAB — TYPE AND SCREEN
ABO/RH(D): O POS
Antibody Screen: NEGATIVE
Unit division: 0

## 2023-02-13 LAB — COMPREHENSIVE METABOLIC PANEL
ALT: 25 U/L (ref 0–44)
AST: 29 U/L (ref 15–41)
Albumin: 2.3 g/dL — ABNORMAL LOW (ref 3.5–5.0)
Alkaline Phosphatase: 56 U/L (ref 38–126)
Anion gap: 5 (ref 5–15)
BUN: 20 mg/dL (ref 8–23)
CO2: 29 mmol/L (ref 22–32)
Calcium: 8.9 mg/dL (ref 8.9–10.3)
Chloride: 104 mmol/L (ref 98–111)
Creatinine, Ser: 1.02 mg/dL — ABNORMAL HIGH (ref 0.44–1.00)
GFR, Estimated: >60 mL/min (ref >60)
Glucose, Bld: 90 mg/dL (ref 70–99)
Potassium: 4.4 mmol/L (ref 3.5–5.1)
Sodium: 138 mmol/L (ref 135–145)
Total Bilirubin: 0.4 mg/dL (ref 0.3–1.2)
Total Protein: 5.7 g/dL — ABNORMAL LOW (ref 6.5–8.1)

## 2023-02-13 LAB — CBC WITH DIFFERENTIAL/PLATELET
Abs Immature Granulocytes: 0.07 10*3/uL (ref 0.00–0.07)
Basophils Absolute: 0 10*3/uL (ref 0.0–0.1)
Basophils Relative: 0 %
Eosinophils Absolute: 0.1 10*3/uL (ref 0.0–0.5)
Eosinophils Relative: 2 %
HCT: 27.5 % — ABNORMAL LOW (ref 36.0–46.0)
Hemoglobin: 9 g/dL — ABNORMAL LOW (ref 12.0–15.0)
Immature Granulocytes: 2 %
Lymphocytes Relative: 34 %
Lymphs Abs: 1.4 10*3/uL (ref 0.7–4.0)
MCH: 35.6 pg — ABNORMAL HIGH (ref 26.0–34.0)
MCHC: 32.7 g/dL (ref 30.0–36.0)
MCV: 108.7 fL — ABNORMAL HIGH (ref 80.0–100.0)
Monocytes Absolute: 0.9 10*3/uL (ref 0.1–1.0)
Monocytes Relative: 23 %
Neutro Abs: 1.6 10*3/uL — ABNORMAL LOW (ref 1.7–7.7)
Neutrophils Relative %: 39 %
Platelets: 125 10*3/uL — ABNORMAL LOW (ref 150–400)
RBC: 2.53 MIL/uL — ABNORMAL LOW (ref 3.87–5.11)
RDW: 22.8 % — ABNORMAL HIGH (ref 11.5–15.5)
WBC: 4 10*3/uL (ref 4.0–10.5)
nRBC: 0.8 % — ABNORMAL HIGH (ref 0.0–0.2)

## 2023-02-13 LAB — BPAM RBC
Blood Product Expiration Date: 202411202359
ISSUE DATE / TIME: 202410241813
Unit Type and Rh: 5100

## 2023-02-13 MED ORDER — GABAPENTIN 100 MG PO CAPS: 200.0000 mg | ORAL_CAPSULE | Freq: Two times a day (BID) | ORAL | 1 refills | Status: DC

## 2023-02-13 MED ORDER — CEFADROXIL 500 MG PO CAPS: 1000.0000 mg | ORAL_CAPSULE | Freq: Two times a day (BID) | ORAL | 0 refills | Status: AC

## 2023-02-13 MED ORDER — DOXYCYCLINE HYCLATE 100 MG PO TABS
100.0000 mg | ORAL_TABLET | Freq: Two times a day (BID) | ORAL | 0 refills | Status: AC
Start: 2023-02-13 — End: 2023-02-20

## 2023-02-13 MED ORDER — LIDOCAINE 4 % EX CREA: TOPICAL_CREAM | Freq: Three times a day (TID) | CUTANEOUS | 0 refills | Status: DC | PRN

## 2023-02-13 MED ORDER — OXYCODONE HCL 5 MG PO TABS: 2.5000 mg | ORAL_TABLET | ORAL | 0 refills | Status: DC | PRN

## 2023-02-13 MED ORDER — HEPARIN SOD (PORK) LOCK FLUSH 100 UNIT/ML IV SOLN
500.0000 [IU] | Freq: Once | INTRAVENOUS | Status: AC
  Administered 2023-02-13: 500 [IU] via INTRAVENOUS
  Filled 2023-02-13: qty 5

## 2023-02-13 NOTE — Plan of Care (Signed)
  Problem: Health Behavior/Discharge Planning: Goal: Ability to manage health-related needs will improve Outcome: Progressing   Problem: Activity: Goal: Risk for activity intolerance will decrease Outcome: Progressing   Problem: Elimination: Goal: Will not experience complications related to bowel motility Outcome: Progressing   Problem: Safety: Goal: Ability to remain free from injury will improve Outcome: Progressing   Problem: Skin Integrity: Goal: Risk for impaired skin integrity will decrease Outcome: Progressing

## 2023-02-13 NOTE — Progress Notes (Signed)
   02/13/23 0940  Readmission Prevention Plan - High Risk  Transportation Screening Complete  PCP or Specialist appointment within 5-7 days of discharge Complete  Home Care Consult (High Risk) Not Complete  High Risk Social Work Consult for recovery care planning/counseling (includes patient and caregiver) Not Complete  High Risk Social Work Consult for recovery care planning/counseling (includes patient and caregiver) Not Complete Comment no consults, patient lives at home with spouse, independent  High Risk Palliative Care Screening Not Applicable  Medication Review Complete  PCP or Specialist appointment within 3-5 days Not Complete Comment PCP and Specialist appointment next week   Discharging home no needs.

## 2023-02-13 NOTE — Care Management Important Message (Signed)
Important Message  Patient Details  Name: Yvette Curry MRN: [id number] Date of Birth: [birth year]   Important Message Given:  Yes - Medicare IM     Corey Harold 02/13/2023, 10:54 AM

## 2023-02-13 NOTE — Discharge Summary (Signed)
Physician Discharge Summary   Patient: Yvette Curry MRN: [id number] DOB: 1956-02-27  Admit date:     02/09/2023  Discharge date: 02/13/23  Discharge Physician: Jonah Blue   PCP: [name], DO   Recommendations at discharge:   Take antibiotics until gone (Cefadroxil and doxycycline twice daily x 7 days) Follow up with vascular surgery as scheduled in November Follow up with Dr. Adriana Simas on Tuesday, 10/29 as scheduled Follow up with Dr. Arbutus Ped on Wednesday, 10/30 as scheduled Referred for outpatient palliative care STOP smoking!  Discharge Diagnoses: Principal Problem:   Cellulitis of right lower extremity Active Problems:   Adenocarcinoma of right lung, stage 4 (HCC)   Emphysema lung (HCC)   HFrEF (heart failure with reduced ejection fraction) (HCC)   Pancytopenia (HCC)   Macrocytic anemia    Hospital Course: 67yo with h/o HTN, chronic systolic CHF, stage 4 lung cancer, and anxiety/depression who presented on 10/21 with RLE pain and erythema.  She was previously admitted from 10/16-18 with RLE cellulitis that was treated with Ceftriaxone/Vanc -> Omnicef/Doxycycline.  Blood cultures negative x 5 days.  DVT US negative.  Diagnosed with recurrent cellulitis, started on Vancomycin and Cefepime with improvement.  Assessment and Plan:  Refactory CELLULITIS in the right lower extremity Also with some signs of cellulitis with pain and tenderness in the left lower extremity.  In the setting of venous insufficiency of both extremities Venous duplex of the lower extremity is negative for DVT Differential include neuropathy from the chemotherapy vs PVD vs inflammatory conditions She was seen in 09/2022 by vascular surgeon for venous insufficiency and recommended compression stockings (she reports inability to wear due to pain) Recommend outpatient follow up with vascular surgery (sees Dr. Myra Gianotti) Started gabapentin 200 mg BID  She is very hesitant to take stronger pain meds, has  oxy for breakthrough pain Plan to transition to cefadroxil 1000 mg BID and doxycyline 100 mg BID x 7 days Continue to keep legs elevated when possible   Stage 4 lung cancer Followed by Dr. Arbutus Ped Treated with carboplatin and then Alimta and Keytruda Recently underwent palliative radiation for RUL nodule No evidence of progression on recent imaging Stasis of LE does not appear to be related to bulky pelvic mets but repeat imaging could be performed if not improving Referred for outpatient palliative care   Pancytopenia From chemo on 01/29/23 Hgb dropped to 7.1 Transfused 1 unit PRBC on 10/24 Recheck CBC today shows Hgb 9.0 She is aware that future transfusions may also be needed   Elevated liver enzymes Stable currently but normal earlier this month Possible from hepatic steatosis vs. Chemotherapy LFTs have normalized   Chronic systolic heart failure  3/8 echo with EF 45-50% and mild AR She appears compensated She reports that she became hypotensive with lasix but also takes Lasix for LE edema Hold Lasix for now Encourage compression stockings instead for edema   COPD Appears compensated Continue Breztri Increased cough 10/24 - CXR done and shows small B effusions, probable atelectasis, and known RUL nodule   Hypotension Resume midodrine prn - reports that she has not needed this   Chorioretinopathy/macular degeneration On avastin injections   Constipation Ordered Colace and Miralax standing and MOM and dulcolax prn  Tobacco dependence Encourage cessation.   This was discussed with the patient and should be reviewed on an ongoing basis.   Patch declined She reports that she is now considering smoking cessation, which is very new for her - she previously said she would  Venous DVT Study Patient Name:  Yvette Curry  Date of Exam:   01/15/2023 Medical Rec #: [id number]        Accession #:    [id number] Date of Birth: 1956-03-06        Patient Gender: F Patient Age:   13 years Exam Location:  [facility] Procedure:      VAS [place] LOWER EXTREMITY VENOUS (DVT) Referring Phys: Coral Else --------------------------------------------------------------------------------  Indications: Edema, Pain, and Erythema.  Risk Factors: Cancer Stage 4 lung Ca. Comparison Study: 06/30/22 Right LE reflux study                   05/20/22 B/L LE DVT study Performing Technologist:  Lowell Guitar RVT, RDMS  Examination Guidelines: A complete evaluation includes B-mode imaging, spectral Doppler, color Doppler, and power Doppler as needed of all accessible portions of each vessel. Bilateral testing is considered an integral part of a complete examination. Limited examinations for reoccurring indications may be performed as noted. The reflux portion of the exam is performed with the patient in reverse Trendelenburg.  +---------+---------------+---------+-----------+----------+--------------+ RIGHT    CompressibilityPhasicitySpontaneityPropertiesThrombus Aging +---------+---------------+---------+-----------+----------+--------------+ CFV      Full           Yes      Yes                                 +---------+---------------+---------+-----------+----------+--------------+ SFJ      Full           Yes      Yes                                 +---------+---------------+---------+-----------+----------+--------------+ FV Prox  Full           Yes      Yes                                 +---------+---------------+---------+-----------+----------+--------------+ FV Mid   Full           Yes      Yes                                 +---------+---------------+---------+-----------+----------+--------------+ FV DistalFull           Yes      Yes                                 +---------+---------------+---------+-----------+----------+--------------+ PFV      Full           Yes      Yes                                 +---------+---------------+---------+-----------+----------+--------------+ POP      Full           Yes      Yes                                 +---------+---------------+---------+-----------+----------+--------------+ PTV      Full  Physician Discharge Summary   Patient: Yvette Curry MRN: [id number] DOB: 1956-02-27  Admit date:     02/09/2023  Discharge date: 02/13/23  Discharge Physician: Jonah Blue   PCP: [name], DO   Recommendations at discharge:   Take antibiotics until gone (Cefadroxil and doxycycline twice daily x 7 days) Follow up with vascular surgery as scheduled in November Follow up with Dr. Adriana Simas on Tuesday, 10/29 as scheduled Follow up with Dr. Arbutus Ped on Wednesday, 10/30 as scheduled Referred for outpatient palliative care STOP smoking!  Discharge Diagnoses: Principal Problem:   Cellulitis of right lower extremity Active Problems:   Adenocarcinoma of right lung, stage 4 (HCC)   Emphysema lung (HCC)   HFrEF (heart failure with reduced ejection fraction) (HCC)   Pancytopenia (HCC)   Macrocytic anemia    Hospital Course: 67yo with h/o HTN, chronic systolic CHF, stage 4 lung cancer, and anxiety/depression who presented on 10/21 with RLE pain and erythema.  She was previously admitted from 10/16-18 with RLE cellulitis that was treated with Ceftriaxone/Vanc -> Omnicef/Doxycycline.  Blood cultures negative x 5 days.  DVT US negative.  Diagnosed with recurrent cellulitis, started on Vancomycin and Cefepime with improvement.  Assessment and Plan:  Refactory CELLULITIS in the right lower extremity Also with some signs of cellulitis with pain and tenderness in the left lower extremity.  In the setting of venous insufficiency of both extremities Venous duplex of the lower extremity is negative for DVT Differential include neuropathy from the chemotherapy vs PVD vs inflammatory conditions She was seen in 09/2022 by vascular surgeon for venous insufficiency and recommended compression stockings (she reports inability to wear due to pain) Recommend outpatient follow up with vascular surgery (sees Dr. Myra Gianotti) Started gabapentin 200 mg BID  She is very hesitant to take stronger pain meds, has  oxy for breakthrough pain Plan to transition to cefadroxil 1000 mg BID and doxycyline 100 mg BID x 7 days Continue to keep legs elevated when possible   Stage 4 lung cancer Followed by Dr. Arbutus Ped Treated with carboplatin and then Alimta and Keytruda Recently underwent palliative radiation for RUL nodule No evidence of progression on recent imaging Stasis of LE does not appear to be related to bulky pelvic mets but repeat imaging could be performed if not improving Referred for outpatient palliative care   Pancytopenia From chemo on 01/29/23 Hgb dropped to 7.1 Transfused 1 unit PRBC on 10/24 Recheck CBC today shows Hgb 9.0 She is aware that future transfusions may also be needed   Elevated liver enzymes Stable currently but normal earlier this month Possible from hepatic steatosis vs. Chemotherapy LFTs have normalized   Chronic systolic heart failure  3/8 echo with EF 45-50% and mild AR She appears compensated She reports that she became hypotensive with lasix but also takes Lasix for LE edema Hold Lasix for now Encourage compression stockings instead for edema   COPD Appears compensated Continue Breztri Increased cough 10/24 - CXR done and shows small B effusions, probable atelectasis, and known RUL nodule   Hypotension Resume midodrine prn - reports that she has not needed this   Chorioretinopathy/macular degeneration On avastin injections   Constipation Ordered Colace and Miralax standing and MOM and dulcolax prn  Tobacco dependence Encourage cessation.   This was discussed with the patient and should be reviewed on an ongoing basis.   Patch declined She reports that she is now considering smoking cessation, which is very new for her - she previously said she would  Venous DVT Study Patient Name:  Yvette Curry  Date of Exam:   01/15/2023 Medical Rec #: [id number]        Accession #:    [id number] Date of Birth: 1956-03-06        Patient Gender: F Patient Age:   13 years Exam Location:  [facility] Procedure:      VAS [place] LOWER EXTREMITY VENOUS (DVT) Referring Phys: Coral Else --------------------------------------------------------------------------------  Indications: Edema, Pain, and Erythema.  Risk Factors: Cancer Stage 4 lung Ca. Comparison Study: 06/30/22 Right LE reflux study                   05/20/22 B/L LE DVT study Performing Technologist:  Lowell Guitar RVT, RDMS  Examination Guidelines: A complete evaluation includes B-mode imaging, spectral Doppler, color Doppler, and power Doppler as needed of all accessible portions of each vessel. Bilateral testing is considered an integral part of a complete examination. Limited examinations for reoccurring indications may be performed as noted. The reflux portion of the exam is performed with the patient in reverse Trendelenburg.  +---------+---------------+---------+-----------+----------+--------------+ RIGHT    CompressibilityPhasicitySpontaneityPropertiesThrombus Aging +---------+---------------+---------+-----------+----------+--------------+ CFV      Full           Yes      Yes                                 +---------+---------------+---------+-----------+----------+--------------+ SFJ      Full           Yes      Yes                                 +---------+---------------+---------+-----------+----------+--------------+ FV Prox  Full           Yes      Yes                                 +---------+---------------+---------+-----------+----------+--------------+ FV Mid   Full           Yes      Yes                                 +---------+---------------+---------+-----------+----------+--------------+ FV DistalFull           Yes      Yes                                 +---------+---------------+---------+-----------+----------+--------------+ PFV      Full           Yes      Yes                                 +---------+---------------+---------+-----------+----------+--------------+ POP      Full           Yes      Yes                                 +---------+---------------+---------+-----------+----------+--------------+ PTV      Full  Venous DVT Study Patient Name:  Yvette Curry  Date of Exam:   01/15/2023 Medical Rec #: [id number]        Accession #:    [id number] Date of Birth: 1956-03-06        Patient Gender: F Patient Age:   13 years Exam Location:  [facility] Procedure:      VAS [place] LOWER EXTREMITY VENOUS (DVT) Referring Phys: Coral Else --------------------------------------------------------------------------------  Indications: Edema, Pain, and Erythema.  Risk Factors: Cancer Stage 4 lung Ca. Comparison Study: 06/30/22 Right LE reflux study                   05/20/22 B/L LE DVT study Performing Technologist:  Lowell Guitar RVT, RDMS  Examination Guidelines: A complete evaluation includes B-mode imaging, spectral Doppler, color Doppler, and power Doppler as needed of all accessible portions of each vessel. Bilateral testing is considered an integral part of a complete examination. Limited examinations for reoccurring indications may be performed as noted. The reflux portion of the exam is performed with the patient in reverse Trendelenburg.  +---------+---------------+---------+-----------+----------+--------------+ RIGHT    CompressibilityPhasicitySpontaneityPropertiesThrombus Aging +---------+---------------+---------+-----------+----------+--------------+ CFV      Full           Yes      Yes                                 +---------+---------------+---------+-----------+----------+--------------+ SFJ      Full           Yes      Yes                                 +---------+---------------+---------+-----------+----------+--------------+ FV Prox  Full           Yes      Yes                                 +---------+---------------+---------+-----------+----------+--------------+ FV Mid   Full           Yes      Yes                                 +---------+---------------+---------+-----------+----------+--------------+ FV DistalFull           Yes      Yes                                 +---------+---------------+---------+-----------+----------+--------------+ PFV      Full           Yes      Yes                                 +---------+---------------+---------+-----------+----------+--------------+ POP      Full           Yes      Yes                                 +---------+---------------+---------+-----------+----------+--------------+ PTV      Full  Physician Discharge Summary   Patient: Yvette Curry MRN: [id number] DOB: 1956-02-27  Admit date:     02/09/2023  Discharge date: 02/13/23  Discharge Physician: Jonah Blue   PCP: [name], DO   Recommendations at discharge:   Take antibiotics until gone (Cefadroxil and doxycycline twice daily x 7 days) Follow up with vascular surgery as scheduled in November Follow up with Dr. Adriana Simas on Tuesday, 10/29 as scheduled Follow up with Dr. Arbutus Ped on Wednesday, 10/30 as scheduled Referred for outpatient palliative care STOP smoking!  Discharge Diagnoses: Principal Problem:   Cellulitis of right lower extremity Active Problems:   Adenocarcinoma of right lung, stage 4 (HCC)   Emphysema lung (HCC)   HFrEF (heart failure with reduced ejection fraction) (HCC)   Pancytopenia (HCC)   Macrocytic anemia    Hospital Course: 67yo with h/o HTN, chronic systolic CHF, stage 4 lung cancer, and anxiety/depression who presented on 10/21 with RLE pain and erythema.  She was previously admitted from 10/16-18 with RLE cellulitis that was treated with Ceftriaxone/Vanc -> Omnicef/Doxycycline.  Blood cultures negative x 5 days.  DVT US negative.  Diagnosed with recurrent cellulitis, started on Vancomycin and Cefepime with improvement.  Assessment and Plan:  Refactory CELLULITIS in the right lower extremity Also with some signs of cellulitis with pain and tenderness in the left lower extremity.  In the setting of venous insufficiency of both extremities Venous duplex of the lower extremity is negative for DVT Differential include neuropathy from the chemotherapy vs PVD vs inflammatory conditions She was seen in 09/2022 by vascular surgeon for venous insufficiency and recommended compression stockings (she reports inability to wear due to pain) Recommend outpatient follow up with vascular surgery (sees Dr. Myra Gianotti) Started gabapentin 200 mg BID  She is very hesitant to take stronger pain meds, has  oxy for breakthrough pain Plan to transition to cefadroxil 1000 mg BID and doxycyline 100 mg BID x 7 days Continue to keep legs elevated when possible   Stage 4 lung cancer Followed by Dr. Arbutus Ped Treated with carboplatin and then Alimta and Keytruda Recently underwent palliative radiation for RUL nodule No evidence of progression on recent imaging Stasis of LE does not appear to be related to bulky pelvic mets but repeat imaging could be performed if not improving Referred for outpatient palliative care   Pancytopenia From chemo on 01/29/23 Hgb dropped to 7.1 Transfused 1 unit PRBC on 10/24 Recheck CBC today shows Hgb 9.0 She is aware that future transfusions may also be needed   Elevated liver enzymes Stable currently but normal earlier this month Possible from hepatic steatosis vs. Chemotherapy LFTs have normalized   Chronic systolic heart failure  3/8 echo with EF 45-50% and mild AR She appears compensated She reports that she became hypotensive with lasix but also takes Lasix for LE edema Hold Lasix for now Encourage compression stockings instead for edema   COPD Appears compensated Continue Breztri Increased cough 10/24 - CXR done and shows small B effusions, probable atelectasis, and known RUL nodule   Hypotension Resume midodrine prn - reports that she has not needed this   Chorioretinopathy/macular degeneration On avastin injections   Constipation Ordered Colace and Miralax standing and MOM and dulcolax prn  Tobacco dependence Encourage cessation.   This was discussed with the patient and should be reviewed on an ongoing basis.   Patch declined She reports that she is now considering smoking cessation, which is very new for her - she previously said she would  Venous DVT Study Patient Name:  Yvette Curry  Date of Exam:   01/15/2023 Medical Rec #: [id number]        Accession #:    [id number] Date of Birth: 1956-03-06        Patient Gender: F Patient Age:   13 years Exam Location:  [facility] Procedure:      VAS [place] LOWER EXTREMITY VENOUS (DVT) Referring Phys: Coral Else --------------------------------------------------------------------------------  Indications: Edema, Pain, and Erythema.  Risk Factors: Cancer Stage 4 lung Ca. Comparison Study: 06/30/22 Right LE reflux study                   05/20/22 B/L LE DVT study Performing Technologist:  Lowell Guitar RVT, RDMS  Examination Guidelines: A complete evaluation includes B-mode imaging, spectral Doppler, color Doppler, and power Doppler as needed of all accessible portions of each vessel. Bilateral testing is considered an integral part of a complete examination. Limited examinations for reoccurring indications may be performed as noted. The reflux portion of the exam is performed with the patient in reverse Trendelenburg.  +---------+---------------+---------+-----------+----------+--------------+ RIGHT    CompressibilityPhasicitySpontaneityPropertiesThrombus Aging +---------+---------------+---------+-----------+----------+--------------+ CFV      Full           Yes      Yes                                 +---------+---------------+---------+-----------+----------+--------------+ SFJ      Full           Yes      Yes                                 +---------+---------------+---------+-----------+----------+--------------+ FV Prox  Full           Yes      Yes                                 +---------+---------------+---------+-----------+----------+--------------+ FV Mid   Full           Yes      Yes                                 +---------+---------------+---------+-----------+----------+--------------+ FV DistalFull           Yes      Yes                                 +---------+---------------+---------+-----------+----------+--------------+ PFV      Full           Yes      Yes                                 +---------+---------------+---------+-----------+----------+--------------+ POP      Full           Yes      Yes                                 +---------+---------------+---------+-----------+----------+--------------+ PTV      Full  Physician Discharge Summary   Patient: Yvette Curry MRN: [id number] DOB: 1956-02-27  Admit date:     02/09/2023  Discharge date: 02/13/23  Discharge Physician: Jonah Blue   PCP: [name], DO   Recommendations at discharge:   Take antibiotics until gone (Cefadroxil and doxycycline twice daily x 7 days) Follow up with vascular surgery as scheduled in November Follow up with Dr. Adriana Simas on Tuesday, 10/29 as scheduled Follow up with Dr. Arbutus Ped on Wednesday, 10/30 as scheduled Referred for outpatient palliative care STOP smoking!  Discharge Diagnoses: Principal Problem:   Cellulitis of right lower extremity Active Problems:   Adenocarcinoma of right lung, stage 4 (HCC)   Emphysema lung (HCC)   HFrEF (heart failure with reduced ejection fraction) (HCC)   Pancytopenia (HCC)   Macrocytic anemia    Hospital Course: 67yo with h/o HTN, chronic systolic CHF, stage 4 lung cancer, and anxiety/depression who presented on 10/21 with RLE pain and erythema.  She was previously admitted from 10/16-18 with RLE cellulitis that was treated with Ceftriaxone/Vanc -> Omnicef/Doxycycline.  Blood cultures negative x 5 days.  DVT US negative.  Diagnosed with recurrent cellulitis, started on Vancomycin and Cefepime with improvement.  Assessment and Plan:  Refactory CELLULITIS in the right lower extremity Also with some signs of cellulitis with pain and tenderness in the left lower extremity.  In the setting of venous insufficiency of both extremities Venous duplex of the lower extremity is negative for DVT Differential include neuropathy from the chemotherapy vs PVD vs inflammatory conditions She was seen in 09/2022 by vascular surgeon for venous insufficiency and recommended compression stockings (she reports inability to wear due to pain) Recommend outpatient follow up with vascular surgery (sees Dr. Myra Gianotti) Started gabapentin 200 mg BID  She is very hesitant to take stronger pain meds, has  oxy for breakthrough pain Plan to transition to cefadroxil 1000 mg BID and doxycyline 100 mg BID x 7 days Continue to keep legs elevated when possible   Stage 4 lung cancer Followed by Dr. Arbutus Ped Treated with carboplatin and then Alimta and Keytruda Recently underwent palliative radiation for RUL nodule No evidence of progression on recent imaging Stasis of LE does not appear to be related to bulky pelvic mets but repeat imaging could be performed if not improving Referred for outpatient palliative care   Pancytopenia From chemo on 01/29/23 Hgb dropped to 7.1 Transfused 1 unit PRBC on 10/24 Recheck CBC today shows Hgb 9.0 She is aware that future transfusions may also be needed   Elevated liver enzymes Stable currently but normal earlier this month Possible from hepatic steatosis vs. Chemotherapy LFTs have normalized   Chronic systolic heart failure  3/8 echo with EF 45-50% and mild AR She appears compensated She reports that she became hypotensive with lasix but also takes Lasix for LE edema Hold Lasix for now Encourage compression stockings instead for edema   COPD Appears compensated Continue Breztri Increased cough 10/24 - CXR done and shows small B effusions, probable atelectasis, and known RUL nodule   Hypotension Resume midodrine prn - reports that she has not needed this   Chorioretinopathy/macular degeneration On avastin injections   Constipation Ordered Colace and Miralax standing and MOM and dulcolax prn  Tobacco dependence Encourage cessation.   This was discussed with the patient and should be reviewed on an ongoing basis.   Patch declined She reports that she is now considering smoking cessation, which is very new for her - she previously said she would

## 2023-02-13 NOTE — Progress Notes (Signed)
Patient up and ambulating in the hallway

## 2023-02-13 NOTE — Progress Notes (Signed)
1610 Patient self administered home medication per doctor's order.
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
## 2023-02-16 ENCOUNTER — Ambulatory Visit (INDEPENDENT_AMBULATORY_CARE_PROVIDER_SITE_OTHER): Payer: Medicare Other | Admitting: Ophthalmology

## 2023-02-16 ENCOUNTER — Encounter (INDEPENDENT_AMBULATORY_CARE_PROVIDER_SITE_OTHER): Payer: Self-pay | Admitting: Ophthalmology

## 2023-02-16 ENCOUNTER — Ambulatory Visit
Admission: RE | Admit: 2023-02-16 | Discharge: 2023-02-16 | Disposition: A | Discharge status: Home / Self Care | Payer: Medicare Other | Source: Ambulatory Visit | Attending: Radiation Oncology | Admitting: Radiation Oncology

## 2023-02-16 DIAGNOSIS — Z961 Presence of intraocular lens: Secondary | ICD-10-CM | POA: Diagnosis not present

## 2023-02-16 DIAGNOSIS — H353211 Exudative age-related macular degeneration, right eye, with active choroidal neovascularization: Secondary | ICD-10-CM

## 2023-02-16 DIAGNOSIS — H35711 Central serous chorioretinopathy, right eye: Secondary | ICD-10-CM

## 2023-02-16 DIAGNOSIS — C3491 Malignant neoplasm of unspecified part of right bronchus or lung: Secondary | ICD-10-CM

## 2023-02-16 DIAGNOSIS — H04123 Dry eye syndrome of bilateral lacrimal glands: Secondary | ICD-10-CM | POA: Diagnosis not present

## 2023-02-16 MED ORDER — BEVACIZUMAB CHEMO INJECTION 1.25MG/0.05ML SYRINGE FOR KALEIDOSCOPE
1.2500 mg | INTRAVITREAL | Status: AC | PRN
  Administered 2023-02-16: 1.25 mg via INTRAVITREAL

## 2023-02-16 NOTE — Progress Notes (Signed)
Radiation Oncology         [phone] ________________________________  Name: Yvette Curry MRN: [id number]  Date of Service: 02/16/2023  DOB: [birth year]  Post Treatment Telephone Note  Diagnosis:  Stage IV, cT1b, N3, M1b NSCLC,adenocarcinoma of the RUL diagnosed in August 2023 with local progression in the RUL. (as documented in provider EOT note)  The patient was available for call today.   Symptoms of fatigue have improved since completing therapy.  Symptoms of skin changes have improved since completing therapy.  Symptoms of esophagitis have improved since completing therapy.  The patient has scheduled follow up with her medical oncologist Dr. Arbutus Ped for ongoing care, and was encouraged to call if she develops concerns or questions regarding radiation.  This concludes the interaction.  Ruel Favors, LPN

## 2023-02-17 ENCOUNTER — Encounter: Payer: Self-pay | Admitting: Family Medicine

## 2023-02-17 ENCOUNTER — Ambulatory Visit (INDEPENDENT_AMBULATORY_CARE_PROVIDER_SITE_OTHER): Payer: Medicare Other | Admitting: Family Medicine

## 2023-02-17 VITALS — BP 108/70 | HR 108 | Wt 148.8 lb

## 2023-02-17 DIAGNOSIS — R600 Localized edema: Secondary | ICD-10-CM

## 2023-02-17 DIAGNOSIS — I77810 Thoracic aortic ectasia: Secondary | ICD-10-CM | POA: Insufficient documentation

## 2023-02-17 DIAGNOSIS — L03115 Cellulitis of right lower limb: Secondary | ICD-10-CM

## 2023-02-17 DIAGNOSIS — I2510 Atherosclerotic heart disease of native coronary artery without angina pectoris: Secondary | ICD-10-CM | POA: Insufficient documentation

## 2023-02-17 MED ORDER — GABAPENTIN 100 MG PO CAPS: 200.0000 mg | ORAL_CAPSULE | Freq: Two times a day (BID) | ORAL | 1 refills | Status: DC

## 2023-02-17 NOTE — Assessment & Plan Note (Addendum)
Advised to finish antibiotic therapy.  Advised to elevate frequently.  Cannot tolerate compression stockings at this time.  Gabapentin refilled.

## 2023-02-17 NOTE — Assessment & Plan Note (Signed)
Advised to elevate legs.

## 2023-02-17 NOTE — Patient Instructions (Signed)
Elevated legs.  Finish antibiotics. Take with food.  Gabapentin refilled.  Follow up in 3 months.  Take care  Dr. Adriana Simas

## 2023-02-17 NOTE — Progress Notes (Signed)
Subjective:  Patient ID: Yvette Curry, female    DOB: [birth year]  Age: 67 y.o. MRN: [id number]  CC: Hospital follow-up   HPI:  67 year old female who is currently undergoing chemotherapy for adenocarcinoma of the right lung presents for hospital follow-up.  2 recent admissions for cellulitis.  Most recently admitted from 10/21 to 10/25.  Admitted for refractory cellulitis of the right lower extremity.  Ultrasound negative for DVT.  Patient was treated with IV antibiotics and transition to cefadroxil and Doxy on discharge.  Patient currently fever free.  Cellulitis has improved but patient is still having significant pain of the right lower extremity. Hesitant to take pain medication.  Has a few days left of antibiotics.  Gabapentin has seemed to help her pain.  She uses oxycodone sparingly.  I am concerned that she may be experiencing neuropathy due to chemotherapy and this may be contributing to her pain.  She has chronic underlying venous insufficiency which is also contributing.  Patient Active Problem List   Diagnosis Date Noted   CAD (coronary artery disease) 02/17/2023   Ascending aorta dilation (HCC) 02/17/2023   Cellulitis of right lower extremity 02/04/2023   Macrocytic anemia 12/10/2022   Loss of weight 12/10/2022   Pancytopenia (HCC) 10/20/2022   HFrEF (heart failure with reduced ejection fraction) (HCC) 07/01/2022   Aortic atherosclerosis (HCC) 06/04/2022   Emphysema lung (HCC) 06/04/2022   Lower extremity edema 06/04/2022   Neutropenia (HCC) 03/27/2022   Encounter for antineoplastic chemotherapy 03/19/2022   Adenocarcinoma of right lung, stage 4 (HCC) 12/12/2021   Tobacco abuse 11/06/2014    Social Hx   Social History   Socioeconomic History   Marital status: Married    Spouse name: Adela Lank   Number of children: Y   Years of education: Not on file   Highest education level: Not on file  Occupational History   Occupation: Personnel officer: UNEMPLOYED   Tobacco Use   Smoking status: Every Day    Current packs/day: 0.50    Average packs/day: 0.5 packs/day for 37.0 years (18.5 ttl pk-yrs)    Types: Cigarettes    Passive exposure: Current   Smokeless tobacco: Never   Tobacco comments:    Smokes half a pack of cigarettes a day. 12/18/2022 Tay  Vaping Use   Vaping status: Former  Substance and Sexual Activity   Alcohol use: No   Drug use: No   Sexual activity: Yes    Comment: hysterectomy  Other Topics Concern   Not on file  Social History Narrative   Not on file   Social Determinants of Health   Financial Resource Strain: Not on file  Food Insecurity: No Food Insecurity (02/10/2023)   Hunger Vital Sign    Worried About Running Out of Food in the Last Year: Never true    Ran Out of Food in the Last Year: Never true  Transportation Needs: No Transportation Needs (02/10/2023)   PRAPARE - Administrator, Civil Service (Medical): No    Lack of Transportation (Non-Medical): No  Physical Activity: Not on file  Stress: Not on file  Social Connections: Not on file    Review of Systems Per HPI  Objective:  BP 108/70   Pulse (!) 108   Wt 148 lb 12.8 oz (67.5 kg)   SpO2 98%   BMI 28.12 kg/m      02/17/2023   10:34 AM 02/13/2023    7:14 AM 02/12/2023   10:08 PM  BP/Weight  Systolic BP 108 103 129  Diastolic BP 70 54 80  Wt. (Lbs) 148.8    BMI 28.12 kg/m2      Physical Exam Vitals and nursing note reviewed.  Constitutional:      General: She is not in acute distress.    Appearance: Normal appearance.  HENT:     Head: Normocephalic and atraumatic.  Cardiovascular:     Rate and Rhythm: Normal rate and regular rhythm.  Pulmonary:     Effort: Pulmonary effort is normal.     Breath sounds: No wheezing.  Skin:    Comments: Right lower extremity with erythema, warmth, and swelling.  Cap refill 3 seconds  Neurological:     Mental Status: She is alert.  Psychiatric:        Mood and Affect: Mood normal.         Behavior: Behavior normal.     Lab Results  Component Value Date   WBC 4.0 02/13/2023   HGB 9.0 (L) 02/13/2023   HCT 27.5 (L) 02/13/2023   PLT 125 (L) 02/13/2023   GLUCOSE 90 02/13/2023   ALT 25 02/13/2023   AST 29 02/13/2023   NA 138 02/13/2023   K 4.4 02/13/2023   CL 104 02/13/2023   CREATININE 1.02 (H) 02/13/2023   BUN 20 02/13/2023   CO2 29 02/13/2023   TSH 5.249 (H) 12/17/2022   INR 1.0 02/04/2023     Assessment & Plan:   Problem List Items Addressed This Visit       Other   Lower extremity edema    Advised to elevate legs.      Cellulitis of right lower extremity - Primary    Advised to finish antibiotic therapy.  Advised to elevate frequently.  Cannot tolerate compression stockings at this time.  Gabapentin refilled.       Meds ordered this encounter  Medications   gabapentin (NEURONTIN) 100 MG capsule    Sig: Take 2 capsules (200 mg total) by mouth 2 (two) times daily.    Dispense:  360 capsule    Refill:  1    Follow-up:  Return in about 3 months (around 05/20/2023) for Follow up Chronic medical issues.  Everlene Other DO [facility]

## 2023-02-18 ENCOUNTER — Encounter: Payer: Self-pay | Admitting: Internal Medicine

## 2023-02-18 ENCOUNTER — Inpatient Hospital Stay: Payer: Medicare Other

## 2023-02-18 ENCOUNTER — Inpatient Hospital Stay (HOSPITAL_BASED_OUTPATIENT_CLINIC_OR_DEPARTMENT_OTHER): Payer: Medicare Other | Admitting: Internal Medicine

## 2023-02-18 ENCOUNTER — Telehealth: Payer: Self-pay | Admitting: Nurse Practitioner

## 2023-02-18 VITALS — BP 98/69 | HR 88

## 2023-02-18 DIAGNOSIS — C3491 Malignant neoplasm of unspecified part of right bronchus or lung: Secondary | ICD-10-CM | POA: Diagnosis not present

## 2023-02-18 DIAGNOSIS — D701 Agranulocytosis secondary to cancer chemotherapy: Secondary | ICD-10-CM

## 2023-02-18 DIAGNOSIS — C778 Secondary and unspecified malignant neoplasm of lymph nodes of multiple regions: Secondary | ICD-10-CM | POA: Diagnosis not present

## 2023-02-18 DIAGNOSIS — Z86718 Personal history of other venous thrombosis and embolism: Secondary | ICD-10-CM | POA: Diagnosis not present

## 2023-02-18 DIAGNOSIS — Z5112 Encounter for antineoplastic immunotherapy: Secondary | ICD-10-CM | POA: Diagnosis present

## 2023-02-18 DIAGNOSIS — Z5111 Encounter for antineoplastic chemotherapy: Secondary | ICD-10-CM | POA: Diagnosis present

## 2023-02-18 DIAGNOSIS — Z923 Personal history of irradiation: Secondary | ICD-10-CM | POA: Diagnosis not present

## 2023-02-18 DIAGNOSIS — C3411 Malignant neoplasm of upper lobe, right bronchus or lung: Secondary | ICD-10-CM | POA: Diagnosis present

## 2023-02-18 DIAGNOSIS — D649 Anemia, unspecified: Secondary | ICD-10-CM | POA: Diagnosis not present

## 2023-02-18 LAB — CBC WITH DIFFERENTIAL (CANCER CENTER ONLY)
Abs Immature Granulocytes: 0.05 10*3/uL (ref 0.00–0.07)
Basophils Absolute: 0 10*3/uL (ref 0.0–0.1)
Basophils Relative: 1 %
Eosinophils Absolute: 0.1 10*3/uL (ref 0.0–0.5)
Eosinophils Relative: 2 %
HCT: 31.9 % — ABNORMAL LOW (ref 36.0–46.0)
Hemoglobin: 10.8 g/dL — ABNORMAL LOW (ref 12.0–15.0)
Immature Granulocytes: 1 %
Lymphocytes Relative: 24 %
Lymphs Abs: 1.5 10*3/uL (ref 0.7–4.0)
MCH: 37.6 pg — ABNORMAL HIGH (ref 26.0–34.0)
MCHC: 33.9 g/dL (ref 30.0–36.0)
MCV: 111.1 fL — ABNORMAL HIGH (ref 80.0–100.0)
Monocytes Absolute: 0.9 10*3/uL (ref 0.1–1.0)
Monocytes Relative: 15 %
Neutro Abs: 3.4 10*3/uL (ref 1.7–7.7)
Neutrophils Relative %: 57 %
Platelet Count: 212 10*3/uL (ref 150–400)
RBC: 2.87 MIL/uL — ABNORMAL LOW (ref 3.87–5.11)
RDW: 20.5 % — ABNORMAL HIGH (ref 11.5–15.5)
WBC Count: 6 10*3/uL (ref 4.0–10.5)
nRBC: 0 % (ref 0.0–0.2)

## 2023-02-18 LAB — CMP (CANCER CENTER ONLY)
ALT: 18 U/L (ref 0–44)
AST: 27 U/L (ref 15–41)
Albumin: 3.2 g/dL — ABNORMAL LOW (ref 3.5–5.0)
Alkaline Phosphatase: 61 U/L (ref 38–126)
Anion gap: 5 (ref 5–15)
BUN: 16 mg/dL (ref 8–23)
CO2: 29 mmol/L (ref 22–32)
Calcium: 9.6 mg/dL (ref 8.9–10.3)
Chloride: 106 mmol/L (ref 98–111)
Creatinine: 0.95 mg/dL (ref 0.44–1.00)
GFR, Estimated: >60 mL/min (ref >60)
Glucose, Bld: 89 mg/dL (ref 70–99)
Potassium: 4.2 mmol/L (ref 3.5–5.1)
Sodium: 140 mmol/L (ref 135–145)
Total Bilirubin: 0.3 mg/dL (ref 0.3–1.2)
Total Protein: 6.5 g/dL (ref 6.5–8.1)

## 2023-02-18 LAB — TSH: TSH: 1.995 u[IU]/mL (ref 0.350–4.500)

## 2023-02-18 MED ORDER — SODIUM CHLORIDE 0.9 % IV SOLN
Freq: Once | INTRAVENOUS | Status: AC
Start: 2023-02-18 — End: 2023-02-18

## 2023-02-18 MED ORDER — HEPARIN SOD (PORK) LOCK FLUSH 100 UNIT/ML IV SOLN
500.0000 [IU] | Freq: Once | INTRAVENOUS | Status: AC | PRN
Start: 2023-02-18 — End: 2023-02-18
  Administered 2023-02-18: 500 [IU]

## 2023-02-18 MED ORDER — SODIUM CHLORIDE 0.9% FLUSH
10.0000 mL | INTRAVENOUS | Status: DC | PRN
  Administered 2023-02-18: 10 mL

## 2023-02-18 MED ORDER — CYANOCOBALAMIN 1000 MCG/ML IJ SOLN
1000.0000 ug | Freq: Once | INTRAMUSCULAR | Status: AC
Start: 2023-02-18 — End: 2023-02-18
  Administered 2023-02-18: 1000 ug via INTRAMUSCULAR
  Filled 2023-02-18: qty 1

## 2023-02-18 MED ORDER — SODIUM CHLORIDE 0.9% FLUSH
10.0000 mL | Freq: Once | INTRAVENOUS | Status: AC
Start: 2023-02-18 — End: 2023-02-18
  Administered 2023-02-18: 10 mL

## 2023-02-18 MED ORDER — PEMBROLIZUMAB CHEMO INJECTION 100 MG/4ML
200.0000 mg | Freq: Once | INTRAVENOUS | Status: AC
  Administered 2023-02-18: 200 mg via INTRAVENOUS
  Filled 2023-02-18: qty 200

## 2023-02-18 NOTE — Progress Notes (Signed)
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 immediately if she has any other concerning symptoms in the interval. The patient voices understanding of current disease status and treatment options and is in agreement with the current care plan.  All questions were answered. The patient knows to call the clinic with any problems, questions or concerns. We can certainly see the patient much sooner if necessary.  The total time spent in the appointment was 30 minutes.  Disclaimer: This note was dictated with voice recognition software. Similar sounding words can inadvertently be transcribed and may not be corrected upon review.

## 2023-02-18 NOTE — Patient Instructions (Signed)

## 2023-02-19 ENCOUNTER — Other Ambulatory Visit: Payer: Self-pay

## 2023-02-19 LAB — T4: T4, Total: 11.1 ug/dL (ref 4.5–12.0)

## 2023-02-24 ENCOUNTER — Other Ambulatory Visit (HOSPITAL_COMMUNITY): Payer: Self-pay | Admitting: Orthopedic Surgery

## 2023-02-24 ENCOUNTER — Ambulatory Visit (HOSPITAL_COMMUNITY)
Admission: RE | Admit: 2023-02-24 | Discharge: 2023-02-24 | Disposition: A | Discharge status: Home / Self Care | Payer: Medicare Other | Source: Ambulatory Visit | Attending: Cardiovascular Disease | Admitting: Cardiovascular Disease

## 2023-02-24 DIAGNOSIS — M79605 Pain in left leg: Secondary | ICD-10-CM | POA: Insufficient documentation

## 2023-02-24 DIAGNOSIS — M79604 Pain in right leg: Secondary | ICD-10-CM | POA: Diagnosis present

## 2023-03-03 ENCOUNTER — Encounter: Payer: Self-pay | Admitting: *Deleted

## 2023-03-03 NOTE — Telephone Encounter (Signed)
LMTRC..mailed letter 
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
## 2023-03-09 ENCOUNTER — Ambulatory Visit (INDEPENDENT_AMBULATORY_CARE_PROVIDER_SITE_OTHER): Payer: Medicare Other | Admitting: Surgery

## 2023-03-09 ENCOUNTER — Encounter: Payer: Self-pay | Admitting: Surgery

## 2023-03-09 VITALS — BP 148/95 | HR 94 | Temp 98.4°F | Resp 20 | Ht 61.0 in | Wt 146.0 lb

## 2023-03-09 DIAGNOSIS — I872 Venous insufficiency (chronic) (peripheral): Secondary | ICD-10-CM | POA: Diagnosis not present
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
## 2023-03-09 NOTE — Progress Notes (Signed)
[facility] [place]  Patient name: Yvette Curry MRN: [id number] DOB: 1955/06/12 Sex: female   REASON FOR VISIT:    Follow-up  HISOTRY OF PRESENT ILLNESS:    Yvette Curry is a 67 y.o. female who returns today for further discussions regarding right leg swelling.  She has undergone imaging via ultrasound that shows no evidence of DVT.  She does have saphenous vein reflux.  She has tried Lasix as well as compression socks without significant relief.  She has developed skin discoloration as well as leg pain.  At her most recent visit, I felt that endovenous laser ablation was not appropriate because of everything she was going through with regards to her cancer.  She has continued to wear compression socks but still having issues.  She has been hospitalized for right leg cellulitis.  She has been started on gabapentin for her neuropathic pain in her right calf and foot.  She is also twisted her ankle and having issues on the left leg now  The patient has a history of stage IV non-small cell lung cancer, adenocarcinoma in the right upper lobe in addition to widespread metastatic adenopathy to the ipsilateral hilum, bilateral mediastinum, bilateral neck, left axilla and left mesentery. This was diagnosed in August 2023 she has undergone chemotherapy  PAST MEDICAL HISTORY:   Past Medical History:  Diagnosis Date   Anxiety    no current tx   Depression    no meds at present   Dyspnea    Family history of adverse reaction to anesthesia    pt states mom had allergic reaction to some unknown anesthesia   GERD (gastroesophageal reflux disease)    no tx since weight loss   Hypercholesteremia    Osteoarthritis    stage IV lung ca 11/2021     FAMILY HISTORY:   Family History  Problem Relation Age of Onset   Heart disease Mother    Kidney cancer Mother    Emphysema Father    Heart disease Father    Colon cancer Neg Hx      SOCIAL HISTORY:   Social History   Tobacco Use   Smoking status: Every Day    Current packs/day: 0.50    Average packs/day: 0.5 packs/day for 37.0 years (18.5 ttl pk-yrs)    Types: Cigarettes    Passive exposure: Current   Smokeless tobacco: Never   Tobacco comments:    Smokes half a pack of cigarettes a day. 12/18/2022 [place]  Substance Use Topics   Alcohol use: No     ALLERGIES:   Allergies  Allergen Reactions   Lidocaine Shortness Of Breath and Anxiety    Patient felt like she "couldn't breathe", panicky Allergic to all " caines"   Mepivacaine Swelling    angioedema   Demerol Nausea And Vomiting   Prednisone Hives and Nausea And Vomiting    abd pain and vomiting, Hives    Sulfa Antibiotics Hives    Hives, swelling and itching     CURRENT MEDICATIONS:   Current Outpatient Medications  Medication Sig Dispense Refill   acetaminophen (TYLENOL) 325 MG tablet Take 2 tablets (650 mg total) by mouth every 6 (six) hours as needed for mild pain (pain score 1-3) (or Fever >/= 101).     ascorbic acid (VITAMIN C) 500 MG tablet Take 500 mg by mouth daily.     Blood Pressure Monitoring (OMRON 3 SERIES BP MONITOR) DEVI Use as directed 1 each 1  Budeson-Glycopyrrol-Formoterol (BREZTRI AEROSPHERE) 160-9-4.8 MCG/ACT AERO Inhale 2 puffs into the lungs in the morning and at bedtime. 10.7 g 3   carboxymethylcellulose 1 % ophthalmic solution Apply 1 drop to eye 3 (three) times daily.     ferrous sulfate 324 MG TBEC Take 1 tablet (324 mg total) by mouth daily with breakfast. 30 tablet 4   folic acid (FOLVITE) 1 MG tablet Take 1 tablet (1 mg total) by mouth daily. 90 tablet 1   gabapentin (NEURONTIN) 100 MG capsule Take 2 capsules (200 mg total) by mouth 2 (two) times daily. 360 capsule 1   lidocaine (LMX) 4 % cream Apply topically 3 (three) times daily as needed (leg pain). 30 g 0   midodrine (PROAMATINE) 5 MG tablet Take 1 tablet (5 mg total) by mouth 3 (three) times daily with meals.  (Patient taking differently: Take 5 mg by mouth 3 (three) times daily with meals. Only when the top number drops below 90) 90 tablet 1   oxyCODONE (OXY IR/ROXICODONE) 5 MG immediate release tablet Take 0.5-1 tablets (2.5-5 mg total) by mouth every 4 (four) hours as needed for severe pain (pain score 7-10) or moderate pain (pain score 4-6) (2.5 mg for moderate pain and 5 mg for severe pain). 15 tablet 0   prochlorperazine (COMPAZINE) 10 MG tablet Take 10 mg by mouth every 6 (six) hours as needed for nausea or vomiting.     No current facility-administered medications for this visit.    REVIEW OF SYSTEMS:   [X]  denotes positive finding, [ ]  denotes negative finding Cardiac  Comments:  Chest pain or chest pressure:    Shortness of breath upon exertion:    Short of breath when lying flat:    Irregular heart rhythm:        Vascular    Pain in calf, thigh, or hip brought on by ambulation:    Pain in feet at night that wakes you up from your sleep:     Blood clot in your veins:    Leg swelling:  x       Pulmonary    Oxygen at home:    Productive cough:     Wheezing:         Neurologic    Sudden weakness in arms or legs:     Sudden numbness in arms or legs:     Sudden onset of difficulty speaking or slurred speech:    Temporary loss of vision in one eye:     Problems with dizziness:         Gastrointestinal    Blood in stool:     Vomited blood:         Genitourinary    Burning when urinating:     Blood in urine:        Psychiatric    Major depression:         Hematologic    Bleeding problems:    Problems with blood clotting too easily:        Skin    Rashes or ulcers:        Constitutional    Fever or chills:      PHYSICAL EXAM:   Vitals:   03/09/23 0834  BP: (!) 148/95  Pulse: 94  Resp: 20  Temp: 98.4 F (36.9 C)  SpO2: 93%  Weight: 146 lb (66.2 kg)  Height: 5\' 1"  (1.549 m)    GENERAL: The patient is a well-nourished female, in no acute distress. The  vital signs are documented above. CARDIAC: There is a regular rate and rhythm.  VASCULAR: Bilateral leg edema, hyperpigmentation bilaterally, right greater than left PULMONARY: Non-labored respirations ABDOMEN: Soft and non-tender with normal pitched bowel sounds.  MUSCULOSKELETAL: There are no major deformities or cyanosis. NEUROLOGIC: No focal weakness or paresthesias are detected. SKIN: There are no ulcers or rashes noted. PSYCHIATRIC: The patient has a normal affect.  STUDIES:   I have reviewed the following: Venous Reflux Times  +--------------+---------+------+-----------+------------+-----------------  ----+  RIGHT        Reflux NoRefluxReflux TimeDiameter cmsComments                                        Yes                                                 +--------------+---------+------+-----------+------------+-----------------  ----+  CFV                    yes   >1 second                                     +--------------+---------+------+-----------+------------+-----------------  ----+  FV mid                  yes   >1 second                                     +--------------+---------+------+-----------+------------+-----------------  ----+  Popliteal    no                                                            +--------------+---------+------+-----------+------------+-----------------  ----+  GSV at [facility]              yes    >500 ms      0.98                            +--------------+---------+------+-----------+------------+-----------------  ----+  GSV prox thigh          yes    >500 ms      0.52                            +--------------+---------+------+-----------+------------+-----------------  ----+  GSV mid thigh           yes    >500 ms      0.53                            +--------------+---------+------+-----------+------------+-----------------  ----+  GSV dist thigh          yes     >500 ms      0.50                            +--------------+---------+------+-----------+------------+-----------------  ----+  GSV at knee             yes    >500 ms      0.42                            +--------------+---------+------+-----------+------------+-----------------  ----+  GSV prox calf           yes    >500 ms      0.36                            +--------------+---------+------+-----------+------------+-----------------  ----+  GSV mid calf            yes    >500 ms      0.28                            +--------------+---------+------+-----------+------------+-----------------  ----+  SSV Pop Fossa no                            0.20    Does not drain  into                                                        popliteal vein          +--------------+---------+------+-----------+------------+-----------------  ----+  SSV prox calf           yes    >500 ms      0.21                            +--------------+---------+------+-----------+------------+-----------------  ----+  SSV mid calf            yes    >500 ms      0.21                            +--------------+---------+------+-----------+------------+-----------------  ----+    MEDICAL ISSUES:   CEAP class 4: I had a lengthy discussion with the patient and her husband.  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  However, she does have significant right leg swelling which is causing her a lot of discomfort and as result she has developed hyperpigmentation with lipo dermatosclerosis.  She has also been hospitalized for cellulitis.  Therefore I offered her laser ablation of the right great saphenous vein to minimize the chance of her cellulitis coming back and to help with her leg swelling.  I was very clear that this will not alleviate all of her symptoms and certainly will have a  dramatic impact on the pain she is having in her right leg.  She is going to talk with Dr. Shirline Frees this week and will contact me whether or not she wants to proceed.    Charlena Cross, MD, FACS [facility] [phone] Pager [phone]

## 2023-03-10 ENCOUNTER — Other Ambulatory Visit: Payer: Self-pay

## 2023-03-11 ENCOUNTER — Inpatient Hospital Stay: Payer: Medicare Other | Attending: Internal Medicine

## 2023-03-11 ENCOUNTER — Inpatient Hospital Stay: Payer: Medicare Other

## 2023-03-11 ENCOUNTER — Encounter: Payer: Self-pay | Admitting: Internal Medicine

## 2023-03-11 ENCOUNTER — Encounter: Payer: Self-pay | Admitting: Nurse Practitioner

## 2023-03-11 ENCOUNTER — Inpatient Hospital Stay (HOSPITAL_BASED_OUTPATIENT_CLINIC_OR_DEPARTMENT_OTHER): Payer: Medicare Other | Admitting: Nurse Practitioner

## 2023-03-11 ENCOUNTER — Inpatient Hospital Stay (HOSPITAL_BASED_OUTPATIENT_CLINIC_OR_DEPARTMENT_OTHER): Payer: Medicare Other | Admitting: Physician Assistant

## 2023-03-11 VITALS — BP 140/93 | HR 88 | Temp 98.5°F | Resp 18 | Ht 61.0 in | Wt 145.6 lb

## 2023-03-11 DIAGNOSIS — C3411 Malignant neoplasm of upper lobe, right bronchus or lung: Secondary | ICD-10-CM | POA: Insufficient documentation

## 2023-03-11 DIAGNOSIS — C3491 Malignant neoplasm of unspecified part of right bronchus or lung: Secondary | ICD-10-CM

## 2023-03-11 DIAGNOSIS — G893 Neoplasm related pain (acute) (chronic): Secondary | ICD-10-CM | POA: Diagnosis not present

## 2023-03-11 DIAGNOSIS — C778 Secondary and unspecified malignant neoplasm of lymph nodes of multiple regions: Secondary | ICD-10-CM | POA: Diagnosis not present

## 2023-03-11 DIAGNOSIS — Z515 Encounter for palliative care: Secondary | ICD-10-CM | POA: Diagnosis not present

## 2023-03-11 DIAGNOSIS — D649 Anemia, unspecified: Secondary | ICD-10-CM | POA: Diagnosis not present

## 2023-03-11 DIAGNOSIS — Z5111 Encounter for antineoplastic chemotherapy: Secondary | ICD-10-CM | POA: Insufficient documentation

## 2023-03-11 DIAGNOSIS — Z79899 Other long term (current) drug therapy: Secondary | ICD-10-CM | POA: Insufficient documentation

## 2023-03-11 DIAGNOSIS — Z5112 Encounter for antineoplastic immunotherapy: Secondary | ICD-10-CM | POA: Diagnosis present

## 2023-03-11 DIAGNOSIS — T451X5A Adverse effect of antineoplastic and immunosuppressive drugs, initial encounter: Secondary | ICD-10-CM

## 2023-03-11 DIAGNOSIS — M792 Neuralgia and neuritis, unspecified: Secondary | ICD-10-CM | POA: Diagnosis not present

## 2023-03-11 LAB — CBC WITH DIFFERENTIAL (CANCER CENTER ONLY)
Abs Immature Granulocytes: 0.02 10*3/uL (ref 0.00–0.07)
Basophils Absolute: 0 10*3/uL (ref 0.0–0.1)
Basophils Relative: 1 %
Eosinophils Absolute: 0.2 10*3/uL (ref 0.0–0.5)
Eosinophils Relative: 2 %
HCT: 35.1 % — ABNORMAL LOW (ref 36.0–46.0)
Hemoglobin: 11.9 g/dL — ABNORMAL LOW (ref 12.0–15.0)
Immature Granulocytes: 0 %
Lymphocytes Relative: 24 %
Lymphs Abs: 1.8 10*3/uL (ref 0.7–4.0)
MCH: 37.3 pg — ABNORMAL HIGH (ref 26.0–34.0)
MCHC: 33.9 g/dL (ref 30.0–36.0)
MCV: 110 fL — ABNORMAL HIGH (ref 80.0–100.0)
Monocytes Absolute: 0.7 10*3/uL (ref 0.1–1.0)
Monocytes Relative: 9 %
Neutro Abs: 4.8 10*3/uL (ref 1.7–7.7)
Neutrophils Relative %: 64 %
Platelet Count: 213 10*3/uL (ref 150–400)
RBC: 3.19 MIL/uL — ABNORMAL LOW (ref 3.87–5.11)
RDW: 16.2 % — ABNORMAL HIGH (ref 11.5–15.5)
WBC Count: 7.5 10*3/uL (ref 4.0–10.5)
nRBC: 0 % (ref 0.0–0.2)

## 2023-03-11 LAB — CMP (CANCER CENTER ONLY)
ALT: 12 U/L (ref 0–44)
AST: 24 U/L (ref 15–41)
Albumin: 3.5 g/dL (ref 3.5–5.0)
Alkaline Phosphatase: 61 U/L (ref 38–126)
Anion gap: 4 — ABNORMAL LOW (ref 5–15)
BUN: 14 mg/dL (ref 8–23)
CO2: 32 mmol/L (ref 22–32)
Calcium: 9.8 mg/dL (ref 8.9–10.3)
Chloride: 106 mmol/L (ref 98–111)
Creatinine: 1.05 mg/dL — ABNORMAL HIGH (ref 0.44–1.00)
GFR, Estimated: 59 mL/min — ABNORMAL LOW (ref >60)
Glucose, Bld: 108 mg/dL — ABNORMAL HIGH (ref 70–99)
Potassium: 3.9 mmol/L (ref 3.5–5.1)
Sodium: 142 mmol/L (ref 135–145)
Total Bilirubin: 0.4 mg/dL (ref <1.2)
Total Protein: 6.8 g/dL (ref 6.5–8.1)

## 2023-03-11 MED ORDER — SODIUM CHLORIDE 0.9% FLUSH
10.0000 mL | Freq: Once | INTRAVENOUS | Status: AC
  Administered 2023-03-11: 10 mL

## 2023-03-11 MED ORDER — SODIUM CHLORIDE 0.9 % IV SOLN
400.0000 mg/m2 | Freq: Once | INTRAVENOUS | Status: AC
  Administered 2023-03-11: 700 mg via INTRAVENOUS
  Filled 2023-03-11: qty 20

## 2023-03-11 MED ORDER — SODIUM CHLORIDE 0.9 % IV SOLN: Freq: Once | INTRAVENOUS | Status: AC

## 2023-03-11 MED ORDER — HEPARIN SOD (PORK) LOCK FLUSH 100 UNIT/ML IV SOLN
500.0000 [IU] | Freq: Once | INTRAVENOUS | Status: AC | PRN
  Administered 2023-03-11: 500 [IU]

## 2023-03-11 MED ORDER — SODIUM CHLORIDE 0.9 % IV SOLN
200.0000 mg | Freq: Once | INTRAVENOUS | Status: AC
  Administered 2023-03-11: 200 mg via INTRAVENOUS
  Filled 2023-03-11: qty 200

## 2023-03-11 MED ORDER — SODIUM CHLORIDE 0.9% FLUSH
10.0000 mL | INTRAVENOUS | Status: DC | PRN
  Administered 2023-03-11: 10 mL

## 2023-03-11 NOTE — Progress Notes (Signed)
Pt took compazine at home prior to appts today.

## 2023-03-11 NOTE — Patient Instructions (Signed)
 [facility] - A DEPT OF [facility]  Discharge Instructions: Thank you for choosing [facility] to provide your oncology and hematology care.   If you have a lab appointment with the Cancer Center, please go directly to the Cancer Center and check in at the registration area.   Wear comfortable clothing and clothing appropriate for easy access to any Portacath or PICC line.   We strive to give you quality time with your provider. You may need to reschedule your appointment if you arrive late (15 or more minutes).  Arriving late affects you and other patients whose appointments are after yours.  Also, if you miss three or more appointments without notifying the office, you may be dismissed from the clinic at the provider's discretion.      For prescription refill requests, have your pharmacy contact our office and allow 72 hours for refills to be completed.    Today you received the following chemotherapy and/or immunotherapy agents Keytruda / Alimta      To help prevent nausea and vomiting after your treatment, we encourage you to take your nausea medication as directed.  BELOW ARE SYMPTOMS THAT SHOULD BE REPORTED IMMEDIATELY: *FEVER GREATER THAN 100.4 F (38 C) OR HIGHER *CHILLS OR SWEATING *NAUSEA AND VOMITING THAT IS NOT CONTROLLED WITH YOUR NAUSEA MEDICATION *UNUSUAL SHORTNESS OF BREATH *UNUSUAL BRUISING OR BLEEDING *URINARY PROBLEMS (pain or burning when urinating, or frequent urination) *BOWEL PROBLEMS (unusual diarrhea, constipation, pain near the anus) TENDERNESS IN MOUTH AND THROAT WITH OR WITHOUT PRESENCE OF ULCERS (sore throat, sores in mouth, or a toothache) UNUSUAL RASH, SWELLING OR PAIN  UNUSUAL VAGINAL DISCHARGE OR ITCHING   Items with * indicate a potential emergency and should be followed up as soon as possible or go to the Emergency Department if any problems should occur.  Please show the CHEMOTHERAPY ALERT CARD or  IMMUNOTHERAPY ALERT CARD at check-in to the Emergency Department and triage nurse.  Should you have questions after your visit or need to cancel or reschedule your appointment, please contact [place] [facility] - A DEPT OF [facility]  Dept: [phone]  and follow the prompts.  Office hours are 8:00 a.m. to 4:30 p.m. Monday - Friday. Please note that voicemails left after 4:00 p.m. may not be returned until the following business day.  We are closed weekends and major holidays. You have access to a nurse at all times for urgent questions. Please call the main number to the clinic Dept: [phone] and follow the prompts.   For any non-urgent questions, you may also contact your provider using MyChart. We now offer e-Visits for anyone 98 and older to request care online for non-urgent symptoms. For details visit [email or web address].   Also download the MyChart app! Go to the app store, search "MyChart", open the app, select [facility], and log in with your MyChart username and password.

## 2023-03-11 NOTE — Progress Notes (Signed)
Pt to receive both Keytruda and Alimta today per Cassie.  Anola Gurney [place], [place], BCPS, BCOP 03/11/2023 1:09 PM

## 2023-03-12 ENCOUNTER — Encounter: Payer: Self-pay | Admitting: Internal Medicine

## 2023-03-12 ENCOUNTER — Encounter: Payer: Self-pay | Admitting: Physician Assistant

## 2023-03-12 NOTE — Telephone Encounter (Signed)
Pt called to schedule her procedures with Dr. Levon Hedger. She states she has a lot going on right now with different appointments with doctors. Advised pt that will have schedule her once we get providers schedule for January. Verbalized understanding.

## 2023-03-15 ENCOUNTER — Encounter (HOSPITAL_COMMUNITY): Payer: Self-pay | Admitting: *Deleted

## 2023-03-15 ENCOUNTER — Emergency Department (HOSPITAL_COMMUNITY): Payer: Medicare Other

## 2023-03-15 ENCOUNTER — Other Ambulatory Visit: Payer: Self-pay

## 2023-03-15 ENCOUNTER — Inpatient Hospital Stay (HOSPITAL_COMMUNITY)
Admission: EM | Admit: 2023-03-15 | Discharge: 2023-03-20 | DRG: 871 | Disposition: A | Discharge status: Home / Self Care | Payer: Medicare Other | Attending: Internal Medicine | Admitting: Internal Medicine

## 2023-03-15 DIAGNOSIS — I2510 Atherosclerotic heart disease of native coronary artery without angina pectoris: Secondary | ICD-10-CM | POA: Diagnosis present

## 2023-03-15 DIAGNOSIS — M1990 Unspecified osteoarthritis, unspecified site: Secondary | ICD-10-CM | POA: Diagnosis present

## 2023-03-15 DIAGNOSIS — J9601 Acute respiratory failure with hypoxia: Secondary | ICD-10-CM | POA: Diagnosis present

## 2023-03-15 DIAGNOSIS — J439 Emphysema, unspecified: Secondary | ICD-10-CM | POA: Diagnosis present

## 2023-03-15 DIAGNOSIS — Z8249 Family history of ischemic heart disease and other diseases of the circulatory system: Secondary | ICD-10-CM | POA: Diagnosis not present

## 2023-03-15 DIAGNOSIS — D61818 Other pancytopenia: Secondary | ICD-10-CM | POA: Diagnosis present

## 2023-03-15 DIAGNOSIS — I5043 Acute on chronic combined systolic (congestive) and diastolic (congestive) heart failure: Secondary | ICD-10-CM | POA: Diagnosis present

## 2023-03-15 DIAGNOSIS — Z882 Allergy status to sulfonamides status: Secondary | ICD-10-CM

## 2023-03-15 DIAGNOSIS — Z1152 Encounter for screening for COVID-19: Secondary | ICD-10-CM

## 2023-03-15 DIAGNOSIS — D84821 Immunodeficiency due to drugs: Secondary | ICD-10-CM | POA: Diagnosis present

## 2023-03-15 DIAGNOSIS — T451X5A Adverse effect of antineoplastic and immunosuppressive drugs, initial encounter: Secondary | ICD-10-CM | POA: Diagnosis present

## 2023-03-15 DIAGNOSIS — C3491 Malignant neoplasm of unspecified part of right bronchus or lung: Secondary | ICD-10-CM | POA: Diagnosis present

## 2023-03-15 DIAGNOSIS — E440 Moderate protein-calorie malnutrition: Secondary | ICD-10-CM | POA: Diagnosis present

## 2023-03-15 DIAGNOSIS — C778 Secondary and unspecified malignant neoplasm of lymph nodes of multiple regions: Secondary | ICD-10-CM | POA: Diagnosis present

## 2023-03-15 DIAGNOSIS — Z79899 Other long term (current) drug therapy: Secondary | ICD-10-CM | POA: Diagnosis not present

## 2023-03-15 DIAGNOSIS — L03115 Cellulitis of right lower limb: Secondary | ICD-10-CM | POA: Diagnosis present

## 2023-03-15 DIAGNOSIS — R509 Fever, unspecified: Principal | ICD-10-CM

## 2023-03-15 DIAGNOSIS — I872 Venous insufficiency (chronic) (peripheral): Secondary | ICD-10-CM | POA: Diagnosis present

## 2023-03-15 DIAGNOSIS — W5503XA Scratched by cat, initial encounter: Secondary | ICD-10-CM

## 2023-03-15 DIAGNOSIS — L0390 Cellulitis, unspecified: Secondary | ICD-10-CM | POA: Diagnosis present

## 2023-03-15 DIAGNOSIS — D701 Agranulocytosis secondary to cancer chemotherapy: Secondary | ICD-10-CM | POA: Diagnosis present

## 2023-03-15 DIAGNOSIS — Z6825 Body mass index (BMI) 25.0-25.9, adult: Secondary | ICD-10-CM | POA: Diagnosis not present

## 2023-03-15 DIAGNOSIS — J449 Chronic obstructive pulmonary disease, unspecified: Secondary | ICD-10-CM | POA: Diagnosis present

## 2023-03-15 DIAGNOSIS — Z90710 Acquired absence of both cervix and uterus: Secondary | ICD-10-CM

## 2023-03-15 DIAGNOSIS — E7800 Pure hypercholesterolemia, unspecified: Secondary | ICD-10-CM | POA: Diagnosis present

## 2023-03-15 DIAGNOSIS — D6959 Other secondary thrombocytopenia: Secondary | ICD-10-CM | POA: Diagnosis present

## 2023-03-15 DIAGNOSIS — Z825 Family history of asthma and other chronic lower respiratory diseases: Secondary | ICD-10-CM

## 2023-03-15 DIAGNOSIS — I878 Other specified disorders of veins: Secondary | ICD-10-CM | POA: Diagnosis present

## 2023-03-15 DIAGNOSIS — R6521 Severe sepsis with septic shock: Secondary | ICD-10-CM

## 2023-03-15 DIAGNOSIS — R21 Rash and other nonspecific skin eruption: Secondary | ICD-10-CM | POA: Diagnosis not present

## 2023-03-15 DIAGNOSIS — F17210 Nicotine dependence, cigarettes, uncomplicated: Secondary | ICD-10-CM | POA: Diagnosis present

## 2023-03-15 DIAGNOSIS — N179 Acute kidney failure, unspecified: Secondary | ICD-10-CM | POA: Diagnosis present

## 2023-03-15 DIAGNOSIS — F419 Anxiety disorder, unspecified: Secondary | ICD-10-CM | POA: Diagnosis present

## 2023-03-15 DIAGNOSIS — F32A Depression, unspecified: Secondary | ICD-10-CM | POA: Diagnosis present

## 2023-03-15 DIAGNOSIS — Z884 Allergy status to anesthetic agent status: Secondary | ICD-10-CM

## 2023-03-15 DIAGNOSIS — Z7951 Long term (current) use of inhaled steroids: Secondary | ICD-10-CM

## 2023-03-15 DIAGNOSIS — R350 Frequency of micturition: Secondary | ICD-10-CM | POA: Diagnosis present

## 2023-03-15 DIAGNOSIS — G629 Polyneuropathy, unspecified: Secondary | ICD-10-CM | POA: Diagnosis present

## 2023-03-15 DIAGNOSIS — I890 Lymphedema, not elsewhere classified: Secondary | ICD-10-CM | POA: Diagnosis present

## 2023-03-15 DIAGNOSIS — J811 Chronic pulmonary edema: Secondary | ICD-10-CM | POA: Diagnosis present

## 2023-03-15 DIAGNOSIS — A419 Sepsis, unspecified organism: Secondary | ICD-10-CM | POA: Diagnosis present

## 2023-03-15 DIAGNOSIS — K219 Gastro-esophageal reflux disease without esophagitis: Secondary | ICD-10-CM | POA: Diagnosis present

## 2023-03-15 DIAGNOSIS — Z888 Allergy status to other drugs, medicaments and biological substances status: Secondary | ICD-10-CM

## 2023-03-15 DIAGNOSIS — Z885 Allergy status to narcotic agent status: Secondary | ICD-10-CM

## 2023-03-15 DIAGNOSIS — Z923 Personal history of irradiation: Secondary | ICD-10-CM

## 2023-03-15 LAB — CBC WITH DIFFERENTIAL/PLATELET
Abs Immature Granulocytes: 0.03 10*3/uL (ref 0.00–0.07)
Basophils Absolute: 0 10*3/uL (ref 0.0–0.1)
Basophils Relative: 0 %
Eosinophils Absolute: 0 10*3/uL (ref 0.0–0.5)
Eosinophils Relative: 1 %
HCT: 34 % — ABNORMAL LOW (ref 36.0–46.0)
Hemoglobin: 11.3 g/dL — ABNORMAL LOW (ref 12.0–15.0)
Immature Granulocytes: 0 %
Lymphocytes Relative: 8 %
Lymphs Abs: 0.7 10*3/uL (ref 0.7–4.0)
MCH: 36.8 pg — ABNORMAL HIGH (ref 26.0–34.0)
MCHC: 33.2 g/dL (ref 30.0–36.0)
MCV: 110.7 fL — ABNORMAL HIGH (ref 80.0–100.0)
Monocytes Absolute: 0.1 10*3/uL (ref 0.1–1.0)
Monocytes Relative: 1 %
Neutro Abs: 7.8 10*3/uL — ABNORMAL HIGH (ref 1.7–7.7)
Neutrophils Relative %: 90 %
Platelets: 165 10*3/uL (ref 150–400)
RBC: 3.07 MIL/uL — ABNORMAL LOW (ref 3.87–5.11)
RDW: 15.3 % (ref 11.5–15.5)
WBC: 8.6 10*3/uL (ref 4.0–10.5)
nRBC: 0 % (ref 0.0–0.2)

## 2023-03-15 LAB — COMPREHENSIVE METABOLIC PANEL
ALT: 15 U/L (ref 0–44)
AST: 28 U/L (ref 15–41)
Albumin: 2.9 g/dL — ABNORMAL LOW (ref 3.5–5.0)
Alkaline Phosphatase: 49 U/L (ref 38–126)
Anion gap: 8 (ref 5–15)
BUN: 21 mg/dL (ref 8–23)
CO2: 25 mmol/L (ref 22–32)
Calcium: 9.5 mg/dL (ref 8.9–10.3)
Chloride: 100 mmol/L (ref 98–111)
Creatinine, Ser: 1.12 mg/dL — ABNORMAL HIGH (ref 0.44–1.00)
GFR, Estimated: 54 mL/min — ABNORMAL LOW (ref >60)
Glucose, Bld: 128 mg/dL — ABNORMAL HIGH (ref 70–99)
Potassium: 3.6 mmol/L (ref 3.5–5.1)
Sodium: 133 mmol/L — ABNORMAL LOW (ref 135–145)
Total Bilirubin: 1.1 mg/dL (ref <1.2)
Total Protein: 7.1 g/dL (ref 6.5–8.1)

## 2023-03-15 LAB — URINALYSIS, W/ REFLEX TO CULTURE (INFECTION SUSPECTED)
Bilirubin Urine: NEGATIVE
Glucose, UA: NEGATIVE mg/dL
Hgb urine dipstick: NEGATIVE
Ketones, ur: NEGATIVE mg/dL
Nitrite: NEGATIVE
Protein, ur: 30 mg/dL — AB
Specific Gravity, Urine: 1.033 — ABNORMAL HIGH (ref 1.005–1.030)
pH: 6 (ref 5.0–8.0)

## 2023-03-15 LAB — BRAIN NATRIURETIC PEPTIDE: B Natriuretic Peptide: 25 pg/mL (ref 0.0–100.0)

## 2023-03-15 LAB — RESP PANEL BY RT-PCR (RSV, FLU A&B, COVID)  RVPGX2
Influenza A by PCR: NEGATIVE
Influenza B by PCR: NEGATIVE
Resp Syncytial Virus by PCR: NEGATIVE
SARS Coronavirus 2 by RT PCR: NEGATIVE

## 2023-03-15 LAB — LACTIC ACID, PLASMA
Lactic Acid, Venous: 1.2 mmol/L (ref 0.5–1.9)
Lactic Acid, Venous: 1.6 mmol/L (ref 0.5–1.9)

## 2023-03-15 LAB — MRSA NEXT GEN BY PCR, NASAL: MRSA by PCR Next Gen: NOT DETECTED

## 2023-03-15 LAB — PROTIME-INR
INR: 1 (ref 0.8–1.2)
Prothrombin Time: 13.9 s (ref 11.4–15.2)

## 2023-03-15 MED ORDER — SODIUM CHLORIDE 0.9 % IV SOLN
2.0000 g | Freq: Two times a day (BID) | INTRAVENOUS | Status: DC
  Administered 2023-03-16 – 2023-03-17 (×4): 2 g via INTRAVENOUS
  Filled 2023-03-15 (×5): qty 12.5

## 2023-03-15 MED ORDER — SODIUM CHLORIDE 0.9 % IV BOLUS: 500.0000 mL | Freq: Once | INTRAVENOUS | Status: DC

## 2023-03-15 MED ORDER — LACTATED RINGERS IV BOLUS
1000.0000 mL | Freq: Once | INTRAVENOUS | Status: AC
  Administered 2023-03-15: 1000 mL via INTRAVENOUS

## 2023-03-15 MED ORDER — ONDANSETRON HCL 4 MG PO TABS: 4.0000 mg | ORAL_TABLET | Freq: Four times a day (QID) | ORAL | Status: DC | PRN

## 2023-03-15 MED ORDER — ACETAMINOPHEN 325 MG PO TABS
650.0000 mg | ORAL_TABLET | Freq: Four times a day (QID) | ORAL | Status: DC | PRN
  Administered 2023-03-16 – 2023-03-19 (×6): 650 mg via ORAL
  Filled 2023-03-15 (×6): qty 2

## 2023-03-15 MED ORDER — MOMETASONE FURO-FORMOTEROL FUM 100-5 MCG/ACT IN AERO
2.0000 | INHALATION_SPRAY | Freq: Two times a day (BID) | RESPIRATORY_TRACT | Status: DC
  Administered 2023-03-16: 2 via RESPIRATORY_TRACT
  Filled 2023-03-15: qty 8.8

## 2023-03-15 MED ORDER — ENOXAPARIN SODIUM 40 MG/0.4ML IJ SOSY
40.0000 mg | PREFILLED_SYRINGE | INTRAMUSCULAR | Status: DC
Start: 2023-03-16 — End: 2023-03-18
  Administered 2023-03-16 – 2023-03-18 (×3): 40 mg via SUBCUTANEOUS
  Filled 2023-03-15 (×3): qty 0.4

## 2023-03-15 MED ORDER — VANCOMYCIN HCL 750 MG/150ML IV SOLN
750.0000 mg | INTRAVENOUS | Status: DC
  Administered 2023-03-16: 750 mg via INTRAVENOUS
  Filled 2023-03-15: qty 150

## 2023-03-15 MED ORDER — UMECLIDINIUM BROMIDE 62.5 MCG/ACT IN AEPB
1.0000 | INHALATION_SPRAY | Freq: Every day | RESPIRATORY_TRACT | Status: DC
  Administered 2023-03-16: 1 via RESPIRATORY_TRACT
  Filled 2023-03-15: qty 7

## 2023-03-15 MED ORDER — SODIUM CHLORIDE 0.9 % IV SOLN: 250.0000 mL | INTRAVENOUS | Status: AC

## 2023-03-15 MED ORDER — CEFEPIME HCL 2 G IV SOLR
2.0000 g | Freq: Once | INTRAVENOUS | Status: AC
  Administered 2023-03-15: 2 g via INTRAVENOUS
  Filled 2023-03-15: qty 12.5

## 2023-03-15 MED ORDER — IOHEXOL 300 MG/ML  SOLN
100.0000 mL | Freq: Once | INTRAMUSCULAR | Status: AC | PRN
  Administered 2023-03-15: 100 mL via INTRAVENOUS

## 2023-03-15 MED ORDER — NOREPINEPHRINE 4 MG/250ML-% IV SOLN
2.0000 ug/min | INTRAVENOUS | Status: DC
  Administered 2023-03-16: 2 ug/min via INTRAVENOUS
  Administered 2023-03-16: 9 ug/min via INTRAVENOUS
  Filled 2023-03-15 (×2): qty 250

## 2023-03-15 MED ORDER — SODIUM CHLORIDE 0.9 % IV BOLUS
250.0000 mL | Freq: Once | INTRAVENOUS | Status: AC
  Administered 2023-03-15: 250 mL via INTRAVENOUS

## 2023-03-15 MED ORDER — MIDODRINE HCL 5 MG PO TABS: 5.0000 mg | ORAL_TABLET | Freq: Three times a day (TID) | ORAL | Status: DC

## 2023-03-15 MED ORDER — ACETAMINOPHEN 650 MG RE SUPP: 650.0000 mg | Freq: Four times a day (QID) | RECTAL | Status: DC | PRN

## 2023-03-15 MED ORDER — ACETAMINOPHEN 500 MG PO TABS
1000.0000 mg | ORAL_TABLET | Freq: Once | ORAL | Status: AC
  Administered 2023-03-15: 1000 mg via ORAL
  Filled 2023-03-15: qty 2

## 2023-03-15 MED ORDER — BUDESON-GLYCOPYRROL-FORMOTEROL 160-9-4.8 MCG/ACT IN AERO: 2.0000 | INHALATION_SPRAY | Freq: Two times a day (BID) | RESPIRATORY_TRACT | Status: DC

## 2023-03-15 MED ORDER — POLYETHYLENE GLYCOL 3350 17 G PO PACK
17.0000 g | PACK | Freq: Every day | ORAL | Status: DC | PRN
  Administered 2023-03-18: 17 g via ORAL
  Filled 2023-03-15: qty 1

## 2023-03-15 MED ORDER — VANCOMYCIN HCL 1250 MG/250ML IV SOLN
1250.0000 mg | Freq: Once | INTRAVENOUS | Status: AC
  Administered 2023-03-15: 1250 mg via INTRAVENOUS
  Filled 2023-03-15: qty 250

## 2023-03-15 MED ORDER — GABAPENTIN 100 MG PO CAPS
200.0000 mg | ORAL_CAPSULE | Freq: Two times a day (BID) | ORAL | Status: DC
  Administered 2023-03-15 – 2023-03-20 (×10): 200 mg via ORAL
  Filled 2023-03-15 (×10): qty 2

## 2023-03-15 MED ORDER — KETOROLAC TROMETHAMINE 15 MG/ML IJ SOLN
15.0000 mg | Freq: Once | INTRAMUSCULAR | Status: AC
  Administered 2023-03-15: 15 mg via INTRAVENOUS
  Filled 2023-03-15: qty 1

## 2023-03-15 MED ORDER — METRONIDAZOLE 500 MG/100ML IV SOLN
500.0000 mg | Freq: Two times a day (BID) | INTRAVENOUS | Status: DC
  Administered 2023-03-15 – 2023-03-16 (×2): 500 mg via INTRAVENOUS
  Filled 2023-03-15 (×2): qty 100

## 2023-03-15 MED ORDER — IPRATROPIUM-ALBUTEROL 0.5-2.5 (3) MG/3ML IN SOLN: 3.0000 mL | RESPIRATORY_TRACT | Status: DC | PRN

## 2023-03-15 MED ORDER — SODIUM CHLORIDE 0.9 % IV BOLUS: 1000.0000 mL | Freq: Once | INTRAVENOUS | Status: DC

## 2023-03-15 MED ORDER — ONDANSETRON HCL 4 MG/2ML IJ SOLN
4.0000 mg | Freq: Four times a day (QID) | INTRAMUSCULAR | Status: DC | PRN
  Administered 2023-03-16 – 2023-03-17 (×2): 4 mg via INTRAVENOUS
  Filled 2023-03-15 (×2): qty 2

## 2023-03-15 NOTE — Assessment & Plan Note (Addendum)
Cellulitis with septic shock.  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  Appears to have chronic stasis discoloration,  and likely acute infection.  Possible left lower extremity cellulitis also, both hard to tell how much of it is chronic discoloration. -Recent bilateral lower extremity venous Dopplers negative for DVT 02/24/23 -Continue broad-spectrum antibiotics IV vancomycin, cefepime and metronidazole for now -Follow-up blood cultures

## 2023-03-15 NOTE — Consult Note (Signed)
Pharmacy Antibiotic Note  Yvette Curry is a 67 y.o. female admitted on 03/15/2023 with sepsis. 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 Pharmacy has been consulted for cefepime and vancomycin dosing.  Plan: Day 1 of antibiotics Cefepime 2g IV every 12 hours Give vancomycin 1250 mg IV x1 followed by 750 mg IV Q24H. Goal AUC 400-550. Expected AUC: 421.9 Expected Css min: 11.3 SCr used: 1.05  Weight used: IBW, Vd used: 0.72 (BMI 27.4) Continue to monitor renal function and follow culture results    Temp (24hrs), Avg:100.2 F (37.9 C), Min:99.5 F (37.5 C), Max:101.5 F (38.6 C)  Recent Labs  Lab 03/11/23 1103 03/15/23 1843 03/15/23 2127  WBC 7.5 8.6  --   CREATININE 1.05* 1.12*  --   LATICACIDVEN  --  1.2 1.6    Estimated Creatinine Clearance: 43 mL/min (A) (by C-G formula based on SCr of 1.12 mg/dL (H)).    Allergies  Allergen Reactions   Lidocaine Shortness Of Breath and Anxiety    Patient felt like she "couldn't breathe", panicky Allergic to all " caines"   Mepivacaine Swelling    angioedema   Demerol Nausea And Vomiting   Prednisone Hives and Nausea And Vomiting    abd pain and vomiting, Hives    Sulfa Antibiotics Hives    Hives, swelling and itching    Antimicrobials this admission: 11/24 Cefepime >> 11/24 Vancomycin >>   Dose adjustments this admission: N/A  Microbiology results: 11/24 BCx: IP 11/24 UCx: ordered 11/24 MRSA PCR: ordered  Thank you for allowing pharmacy to be a part of this patient's care.  Arabella Merles, PharmD Clinical Pharmacist 03/15/2023 11:27 PM

## 2023-03-15 NOTE — Assessment & Plan Note (Addendum)
O2 sats down to 88% on room air.  Currently on 2 L.  Chest x-ray suggesting pulmonary edema, and increasing small pleural effusions. -Diurese when able -Patient asked that if thoracentesis is needed that we talk to her oncologist first.

## 2023-03-15 NOTE — Assessment & Plan Note (Addendum)
Meeting criteria with fever of 101.5, tachycardia heart rate 94-124, tachypnea respiratory rate 17-29, and hypotension blood pressure down to 70/59.  Lactic acid 1.2.  Also evidence of endorgan dysfunction acute hypoxic respiratory failure.  Etiology of infection likely from lower extremity recurrent right lower extremity cellulitis, possibly left also.  COVID test negative.  Immunocompromised on chemotherapy. -UA with trace leukocytes and rare bacteria not quite convincing for etiology of septic shock - ~~1.25 L bolus given. -Will start peripheral pressors , considering presence of pulmonary edema and hypoxia -Continue broad-spectrum antibiotics IV vancomycin, cefepime and metronidazole -Follow-up blood and urine cultures -She is on PRN midodrine 5 mg

## 2023-03-15 NOTE — Assessment & Plan Note (Deleted)
Last echo 06/2022 EF of 45 to 50% grade 1 DD.  CT chest- Small, right-greater-than-left pleural effusions, increased compared to prior examination. New interlobular septal thickening at the lung bases, consistent with pulmonary edema.

## 2023-03-15 NOTE — Assessment & Plan Note (Addendum)
Chest CT suggesting increasing small right greater than left pleural effusions, and pulmonary edema at the lung bases.  O2 sats dropped to 88% on room air currently on 2 L.  Not on diuretics.  Last echo 06/2022 EF of 40 to 45% with grade 1 DD. -Hold off on diuretics at this time , the setting of septic shock - BNP

## 2023-03-15 NOTE — Consult Note (Addendum)
Pharmacy Antibiotic Note  Yvette Curry is a 67 y.o. female admitted on 03/15/2023 with sepsis. 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 Pharmacy has been consulted for vancomycin dosing.  Plan: Day 1 of antibiotics Give vancomycin 1250 mg IV x1 followed by 750 mg IV Q24H. Goal AUC 400-550. Expected AUC: 421.9 Expected Css min: 11.3 SCr used: 1.05  Weight used: IBW, Vd used: 0.72 (BMI 27.4) Continue to monitor renal function and follow culture results      Temp (24hrs), Avg:99.5 F (37.5 C), Min:99.5 F (37.5 C), Max:99.5 F (37.5 C)  Recent Labs  Lab 03/11/23 1103 03/15/23 1843  WBC 7.5 8.6  CREATININE 1.05*  --     Estimated Creatinine Clearance: 45.8 mL/min (A) (by C-G formula based on SCr of 1.05 mg/dL (H)).    Allergies  Allergen Reactions   Lidocaine Shortness Of Breath and Anxiety    Patient felt like she "couldn't breathe", panicky Allergic to all " caines"   Mepivacaine Swelling    angioedema   Demerol Nausea And Vomiting   Prednisone Hives and Nausea And Vomiting    abd pain and vomiting, Hives    Sulfa Antibiotics Hives    Hives, swelling and itching    Antimicrobials this admission: 11/24 Cefepime x1 11/24 Vancomycin >>   Dose adjustments this admission: N/A  Microbiology results: 11/24 BCx: IP 11/24 UCx: ordered 11/24 MRSA PCR: ordered  Thank you for allowing pharmacy to be a part of this patient's care.  Celene Squibb, PharmD Clinical Pharmacist 03/15/2023 7:31 PM
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
## 2023-03-15 NOTE — ED Triage Notes (Signed)
Pt with fever 100.2 to 100.8 the other day, chills, dizziness today.  Decrease appetite. Pt last chemo this past Wednesday. Pt with recent cellulitis to right lower leg and has finished the antibiotic.

## 2023-03-15 NOTE — Assessment & Plan Note (Addendum)
Follows with Dr. Shirline Frees.  Underwent palliative radiation therapy to right upper lung pulmonary nodule. On Chemotherapy-Keytruda and Alimta.

## 2023-03-15 NOTE — Assessment & Plan Note (Deleted)
Severe sepsis.  Meeting criteria with tachycardia heart rate 94-100 greater than 24, tachypnea respiratory rate 17-29, and hypotension blood pressure down to 70/59.  Also evidence of endorgan dysfunction acute hypoxic respiratory failure.  Etiology of infection likely from lower extremity recurrent right lower extremity cellulitis, possibly left also. -1.25 L bolus given. -Hold off on further fluids, if persistently hypotensive will need pressors -Continue broad-spectrum antibiotics IV vancomycin and ceftriaxone

## 2023-03-16 ENCOUNTER — Encounter (INDEPENDENT_AMBULATORY_CARE_PROVIDER_SITE_OTHER): Payer: Medicare Other | Admitting: Ophthalmology

## 2023-03-16 ENCOUNTER — Encounter (INDEPENDENT_AMBULATORY_CARE_PROVIDER_SITE_OTHER): Payer: Self-pay

## 2023-03-16 ENCOUNTER — Other Ambulatory Visit: Payer: Self-pay

## 2023-03-16 DIAGNOSIS — R6521 Severe sepsis with septic shock: Secondary | ICD-10-CM | POA: Diagnosis not present

## 2023-03-16 DIAGNOSIS — J9601 Acute respiratory failure with hypoxia: Secondary | ICD-10-CM | POA: Diagnosis not present

## 2023-03-16 DIAGNOSIS — C3491 Malignant neoplasm of unspecified part of right bronchus or lung: Secondary | ICD-10-CM

## 2023-03-16 DIAGNOSIS — H353211 Exudative age-related macular degeneration, right eye, with active choroidal neovascularization: Secondary | ICD-10-CM

## 2023-03-16 DIAGNOSIS — H35711 Central serous chorioretinopathy, right eye: Secondary | ICD-10-CM

## 2023-03-16 DIAGNOSIS — I5043 Acute on chronic combined systolic (congestive) and diastolic (congestive) heart failure: Secondary | ICD-10-CM | POA: Diagnosis not present

## 2023-03-16 DIAGNOSIS — H04123 Dry eye syndrome of bilateral lacrimal glands: Secondary | ICD-10-CM

## 2023-03-16 DIAGNOSIS — A419 Sepsis, unspecified organism: Secondary | ICD-10-CM | POA: Diagnosis not present

## 2023-03-16 DIAGNOSIS — Z961 Presence of intraocular lens: Secondary | ICD-10-CM

## 2023-03-16 LAB — CBC
HCT: 28.8 % — ABNORMAL LOW (ref 36.0–46.0)
Hemoglobin: 9.4 g/dL — ABNORMAL LOW (ref 12.0–15.0)
MCH: 36.6 pg — ABNORMAL HIGH (ref 26.0–34.0)
MCHC: 32.6 g/dL (ref 30.0–36.0)
MCV: 112.1 fL — ABNORMAL HIGH (ref 80.0–100.0)
Platelets: 131 10*3/uL — ABNORMAL LOW (ref 150–400)
RBC: 2.57 MIL/uL — ABNORMAL LOW (ref 3.87–5.11)
RDW: 15.1 % (ref 11.5–15.5)
WBC: 6.8 10*3/uL (ref 4.0–10.5)
nRBC: 0 % (ref 0.0–0.2)

## 2023-03-16 LAB — BASIC METABOLIC PANEL
Anion gap: 7 (ref 5–15)
BUN: 23 mg/dL (ref 8–23)
CO2: 25 mmol/L (ref 22–32)
Calcium: 8.9 mg/dL (ref 8.9–10.3)
Chloride: 102 mmol/L (ref 98–111)
Creatinine, Ser: 1.34 mg/dL — ABNORMAL HIGH (ref 0.44–1.00)
GFR, Estimated: 44 mL/min — ABNORMAL LOW (ref >60)
Glucose, Bld: 140 mg/dL — ABNORMAL HIGH (ref 70–99)
Potassium: 4.2 mmol/L (ref 3.5–5.1)
Sodium: 134 mmol/L — ABNORMAL LOW (ref 135–145)

## 2023-03-16 LAB — GLUCOSE, CAPILLARY: Glucose-Capillary: 101 mg/dL — ABNORMAL HIGH (ref 70–99)

## 2023-03-16 MED ORDER — POTASSIUM CHLORIDE CRYS ER 20 MEQ PO TBCR
40.0000 meq | EXTENDED_RELEASE_TABLET | Freq: Once | ORAL | Status: AC
  Administered 2023-03-16: 40 meq via ORAL
  Filled 2023-03-16: qty 2

## 2023-03-16 MED ORDER — MIDODRINE HCL 5 MG PO TABS
10.0000 mg | ORAL_TABLET | Freq: Once | ORAL | Status: AC
  Administered 2023-03-16: 10 mg via ORAL

## 2023-03-16 MED ORDER — ORAL CARE MOUTH RINSE
15.0000 mL | OROMUCOSAL | Status: DC | PRN
  Administered 2023-03-19: 15 mL via OROMUCOSAL

## 2023-03-16 MED ORDER — FOLIC ACID 1 MG PO TABS
1.0000 mg | ORAL_TABLET | Freq: Every day | ORAL | Status: DC
  Administered 2023-03-16 – 2023-03-20 (×5): 1 mg via ORAL
  Filled 2023-03-16 (×6): qty 1

## 2023-03-16 MED ORDER — MIDODRINE HCL 5 MG PO TABS
10.0000 mg | ORAL_TABLET | Freq: Three times a day (TID) | ORAL | Status: DC
  Administered 2023-03-16 – 2023-03-20 (×13): 10 mg via ORAL
  Filled 2023-03-16 (×15): qty 2

## 2023-03-16 MED ORDER — LACTATED RINGERS IV BOLUS
1000.0000 mL | Freq: Once | INTRAVENOUS | Status: AC
  Administered 2023-03-16: 1000 mL via INTRAVENOUS

## 2023-03-16 MED ORDER — CHLORHEXIDINE GLUCONATE CLOTH 2 % EX PADS
6.0000 | MEDICATED_PAD | Freq: Every day | CUTANEOUS | Status: DC
  Administered 2023-03-16 – 2023-03-17 (×2): 6 via TOPICAL

## 2023-03-16 MED ORDER — NOREPINEPHRINE 4 MG/250ML-% IV SOLN
0.0000 ug/min | INTRAVENOUS | Status: DC
  Administered 2023-03-16: 11 ug/min via INTRAVENOUS
  Administered 2023-03-16: 13 ug/min via INTRAVENOUS
  Administered 2023-03-17: 10 ug/min via INTRAVENOUS
  Administered 2023-03-17: 12 ug/min via INTRAVENOUS
  Administered 2023-03-17: 13 ug/min via INTRAVENOUS
  Administered 2023-03-18: 4 ug/min via INTRAVENOUS
  Filled 2023-03-16 (×6): qty 250

## 2023-03-16 MED ORDER — VITAMIN C 500 MG PO TABS
500.0000 mg | ORAL_TABLET | Freq: Every day | ORAL | Status: DC
  Administered 2023-03-16 – 2023-03-18 (×3): 500 mg via ORAL
  Filled 2023-03-16 (×3): qty 1

## 2023-03-16 MED ORDER — LACTATED RINGERS IV SOLN: INTRAVENOUS | Status: AC

## 2023-03-16 NOTE — ED Notes (Signed)
Levophed drip increased by 1 mcg

## 2023-03-16 NOTE — ED Notes (Signed)
Levophed drip decreased by 1 mcg

## 2023-03-16 NOTE — ED Notes (Signed)
Levophed drip increased by 1

## 2023-03-16 NOTE — Progress Notes (Deleted)
On arrival patient belongings include:  2 black shoes, 1 blue tank top, 1 set of socks, 1 set of pajamas. Placed in patient belonging bag and given to family.   Payton Emerald RN

## 2023-03-16 NOTE — ED Notes (Signed)
Pt alert at this time, no other complaints

## 2023-03-16 NOTE — Progress Notes (Signed)
Pt arrived from [facility] via [facility] with the following belongings:  - 1 pair of jeans - 2 pink slippers - 1 black/tan cell phone  No family at bedside at this time

## 2023-03-16 NOTE — ED Provider Notes (Signed)
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 Specific Question:   Antibiotic Indication:    Answer:   Other Indication (list below)   ipratropium-albuterol (DUONEB) 0.5-2.5 (3) MG/3ML nebulizer solution 3 mL   ceFEPIme (MAXIPIME) 2 g in sodium chloride 0.9 % 100 mL IVPB    Order Specific Question:   Antibiotic Indication:    Answer:   Sepsis   midodrine (PROAMATINE) tablet 10 mg    Patient taking differently: Only when the top number drops below 90     lactated ringers bolus 1,000 mL   midodrine (PROAMATINE) tablet 10 mg    potassium chloride SA (KLOR-CON M) CR tablet 40 mEq   Chlorhexidine Gluconate Cloth 2 % PADS 6 each   Oral care mouth rinse   norepinephrine (LEVOPHED) 4mg  in (0.016 mg/mL) premix infusion   folic acid (FOLVITE) tablet 1 mg   ascorbic acid (VITAMIN C) tablet 500 mg    -I have reviewed the patients home medicines and have made adjustments as needed  Critical interventions Fluid resuscitation, broad-spectrum antibiotics   Cardiac Monitoring: The patient was maintained on a cardiac monitor.  I personally viewed and interpreted the cardiac monitored which showed an underlying rhythm of: NSR, sinus  tachycardia  Social Determinants of Health:  Factors impacting patients care include: none   Reevaluation: After the interventions noted above, I reevaluated the patient and found that they have :improved  Co morbidities that complicate the patient evaluation  Past Medical History:  Diagnosis Date   Anxiety    no current tx   Depression    no meds at present   Dyspnea    Family history of adverse reaction to anesthesia    pt states mom had allergic reaction to some unknown anesthesia   GERD (gastroesophageal reflux disease)    no tx since weight loss   Hypercholesteremia    Osteoarthritis    stage IV lung ca 11/2021      Dispostion: I considered admission for this patient, and patient require hospital admission for fever, immunosuppression and concern for underlying bacteremia     Final Clinical Impression(s) / ED Diagnoses Final diagnoses:  None     @PCDICTATION @    Glendora Score, MD 03/16/23 1156

## 2023-03-16 NOTE — ED Notes (Signed)
Levophed increased by 1 mcg
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
## 2023-03-16 NOTE — ED Notes (Signed)
Pt alert and oriented x4. No complaints at this time

## 2023-03-16 NOTE — Progress Notes (Signed)
Patient's rings removed with husband at bedside and he has them

## 2023-03-16 NOTE — ED Provider Notes (Signed)
ICU beds are not available here, I discussed case with Dr. Tonia Brooms of pulmonary critical care service who agrees to accept the patient at [facility].   Dione Booze, MD 03/16/23 [phone]

## 2023-03-16 NOTE — ED Notes (Addendum)
Pt to restroom in wheelchair. Pt ambulatory with assistance.

## 2023-03-17 DIAGNOSIS — A419 Sepsis, unspecified organism: Secondary | ICD-10-CM | POA: Diagnosis not present

## 2023-03-17 DIAGNOSIS — L03115 Cellulitis of right lower limb: Secondary | ICD-10-CM | POA: Diagnosis not present

## 2023-03-17 DIAGNOSIS — R6521 Severe sepsis with septic shock: Secondary | ICD-10-CM | POA: Diagnosis not present

## 2023-03-17 DIAGNOSIS — I5043 Acute on chronic combined systolic (congestive) and diastolic (congestive) heart failure: Secondary | ICD-10-CM | POA: Diagnosis not present

## 2023-03-17 LAB — URINE CULTURE

## 2023-03-17 MED ORDER — BUDESON-GLYCOPYRROL-FORMOTEROL 160-9-4.8 MCG/ACT IN AERO
2.0000 | INHALATION_SPRAY | Freq: Two times a day (BID) | RESPIRATORY_TRACT | Status: DC
  Administered 2023-03-17 – 2023-03-20 (×7): 2 via RESPIRATORY_TRACT

## 2023-03-17 MED ORDER — VANCOMYCIN HCL 1250 MG/250ML IV SOLN
1250.0000 mg | INTRAVENOUS | Status: DC
Start: 2023-03-17 — End: 2023-03-18
  Administered 2023-03-17: 1250 mg via INTRAVENOUS
  Filled 2023-03-17: qty 250

## 2023-03-17 MED ORDER — AZITHROMYCIN 500 MG PO TABS
500.0000 mg | ORAL_TABLET | Freq: Once | ORAL | Status: AC
Start: 2023-03-17 — End: 2023-03-17
  Administered 2023-03-17: 500 mg via ORAL
  Filled 2023-03-17: qty 1

## 2023-03-17 MED ORDER — AZITHROMYCIN 250 MG PO TABS
250.0000 mg | ORAL_TABLET | Freq: Every day | ORAL | Status: DC
  Administered 2023-03-18: 250 mg via ORAL
  Filled 2023-03-17: qty 1

## 2023-03-17 NOTE — Consult Note (Signed)
Pharmacy Antibiotic Note  Yvette Curry is a 67 y.o. female admitted on 03/15/2023 with sepsis from cellulitis. 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 Pharmacy has been consulted for cefepime and vancomycin dosing.  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  Day 3 of Cefepime and Vancomycin. Remains on pressors.  SCr up today - will adjust Vancomycin dosing.   Plan: Adjust Vancomycin to 1250 mg IV every 48 hours.  Continue Cefepime 2g IV every 12 hours.  Add Azithromycin 500mg  po x1 then 250mg  po daily for 4 days as discussed with Dr. Denese Killings.   Height: 5\' 1"  (154.9 cm) Weight: 61 kg (134 lb 7.7 oz) IBW/kg (Calculated) : 47.8 Temp (24hrs), Avg:98.7 F (37.1 C), Min:98.1 F (36.7 C), Max:99.8 F (37.7 C)  Recent Labs  Lab 03/11/23 1103 03/15/23 1843 03/15/23 2127 03/16/23 0505  WBC 7.5 8.6  --  6.8  CREATININE 1.05* 1.12*  --  1.34*  LATICACIDVEN  --  1.2 1.6  --     Estimated Creatinine Clearance: 34.6 mL/min (A) (by C-G formula based on SCr of 1.34 mg/dL (H)).    Allergies  Allergen Reactions   Lidocaine Shortness Of Breath and Anxiety    Patient felt like she "couldn't breathe", panicky Allergic to all " caines"   Mepivacaine Swelling    angioedema   Demerol Nausea And Vomiting   Prednisone Hives and Nausea And Vomiting    abd pain and vomiting, Hives    Sulfa Antibiotics Hives    Hives, swelling and itching    Antimicrobials this admission: 11/24 Cefepime >> 11/24 Vancomycin >>  11/26 Azithromycin >>  Dose adjustments this admission: N/A  Microbiology results: 11/24 BCx: ngtd x2 days 11/24 UCx: multiple species, suggest recollection 11/24 MRSA PCR: not detected  Thank you for allowing pharmacy to be a part of this patient's care.  Link Snuffer, PharmD, BCPS, BCCCP Please refer to [facility] for [facility]  numbers 03/17/2023 11:20 AM

## 2023-03-17 NOTE — TOC CM/SW Note (Signed)
Transition of Care [facility]) - Inpatient Brief Assessment   Patient Details  Name: Yvette Curry MRN: [id number] Date of Birth: 05-27-1955  Transition of Care [facility]) CM/SW Contact:    Tom-Johnson, Hershal Coria, RN Phone Number: 03/17/2023, 1:17 PM   Clinical Narrative:  Patient presented to the ED with a Temp of 100.8 Chills, Dizziness and Decrease Appetite. Current Temp today at 99.0. Patient has a hx of stage IV Lung Cancer and undergoing Palliative Radiation and Chemotherapy.  Patient is on IV abx for UTI and Cellulitis to Rt LE. Currently on 3L O2, does not use home O2.   From home with husband, has two supportive children. Has all necessary DME's at home.  PCP is [name], DO and uses [facility] in [place].   No TOC needs or recommendations noted at this time.  Patient not Medically ready for discharge.  CM will continue to follow as patient progresses with care towards discharge.        Transition of Care Asessment: Insurance and Status: Insurance coverage has been reviewed Patient has primary care physician: Yes Home environment has been reviewed: Yes Prior level of function:: Modified Independent Prior/Current Home Services: No current home services Social Determinants of Health Reivew: SDOH reviewed no interventions necessary Readmission risk has been reviewed: Yes Transition of care needs: transition of care needs identified, TOC will continue to follow

## 2023-03-17 NOTE — Progress Notes (Signed)
NAME:  Yvette Curry, MRN:  [id number], DOB:  Sep 02, 1955, LOS: 2 ADMISSION DATE:  03/15/2023, CONSULTATION DATE:  03/16/2023 REFERRING MD:  Preston Fleeting - ED [facility], CHIEF COMPLAINT:  Septic Shock   History of Present Illness:  67 year old woman who was transferred from [facility] for septic shock from suspected cellulitis.   She presented to [facility] on 11/24 with fever and lightheadedness for 2 days. Her right leg pain and erythema was worse than it had been earlier in the week.  She was treated as septic shock related to recurrent cellulitis with fluids, antibiotics and vasopressors.   She is currently undergoing chemotherapy for stage IV lung cancer with latest round last week, followed by Dr Shirline Frees.  She was admitted to [facility] with right leg cellulitis in late October and completed antibiotic therapy on 11/1. The pain and swelling were much improved and she was able to start ambulating unassisted again.   She may have sustained a small cat scratch on her right on 11/22.   On review of systems she reports some increased cough without a change in sputum production. Review is otherwise negative.   She was seen by Dr Myra Gianotti regarding her venous insufficiency on 11/18, and he had suggested laser vein ablation might help prevent further infection.   Pertinent  Medical History    has a past medical history of Anxiety, Depression, Dyspnea, Family history of adverse reaction to anesthesia, GERD (gastroesophageal reflux disease), Hypercholesteremia, Osteoarthritis, and stage IV lung ca (11/2021).    Significant Hospital Events: Including procedures, antibiotic start and stop dates in addition to other pertinent events   11/24 - admitted initially to the hospitalist service at [facility] 11/25 - transferred to [facility] once started on vasopressors.   Interim History / Subjective:  Feels a little better today. No longer lightheaded C/o RLE pain and forehead pain when coughing/sinus pressure.  Remains on NE.    Objective   Blood pressure (!) 82/66, pulse 90, temperature 99 F (37.2 C), temperature source Oral, resp. rate 20, height 5\' 1"  (1.549 m), weight 61 kg, SpO2 98%.        Intake/Output Summary (Last 24 hours) at 03/17/2023 1141 Last data filed at 03/17/2023 1019 Gross per 24 hour  Intake 2493.77 ml  Output 1450 ml  Net 1043.77 ml   Filed Weights   03/16/23 0653 03/17/23 0333  Weight: 68 kg 61 kg   Examination: General:Middle aged female in NAD HENT: [place]/AT, PERRL, no JVD Lungs: Clear bilateral breath sounds Cardiovascular: RRR, no MRG Abdomen: Soft, NT, ND Extremities: Bilateral lower extremity edema. R>L. Erythema of the RLE as well from ankle to knee. Outline marked. Tender to palpation. No crepitus.  Neuro: Alert, oriented, non-focal  Ancillary Tests:   CT chest shows small but increasing pleural effusions. Assessment & Plan:    Septic shock (HCC) due to cellulitis of the right leg. This is her third admission for the same issue. Never seems to fully resolve per the family. Also report of a cat scratch, but seems to have happened well after symptoms started.  - continue cefepime, vancomycin for empiric treatment - Will consult ID as infection is recurrent despite two hospitalizations and treatment courses.  - NE for MAP goal 65 - Midodrine - She has been offered laser ablation of the saphenous vein to reduce cellulitis risk by vascular surgery as an outpatient.   Emphysema lung (HCC) COPD without acute exacerbation Mild obstruction on PFT - continue home Breztri - PRN duoneb  Adenocarcinoma of right lung, stage 4 [facility]): widespread metastatic adenopathy to the ipsilateral hilum, bilateral mediastinum, bilateral neck, left axilla and left mesentery diagnosed in August 2023. S/p SBRT to the right upper lobe. Current therapy is carboplatin for AUC of 5, Alimta 500 Mg/M2 and Keytruda 200 Mg IV every 3 weeks.  Status post 21 cycles. Last tx 11/20. - Supportive care for  now  Acute hypoxic respiratory failure: small bilateral pleural effusions and COPD - Effusions quite small on prior imaging. Likely too small to tap - repeat CXR in AM  Chronic combined systolic (congestive) and diastolic (congestive) heart failure  - Careful with volume - holding home antihypertensives.    Best Practice (right click and "Reselect all SmartList Selections" daily)   Diet/type: Regular consistency (see orders) DVT prophylaxis: enoxaparin (LOVENOX) injection 40 mg Start: 03/16/23 1000   Pressure ulcer(s): not present on admission  GI prophylaxis: N/A Lines: N/A patient has Port-a-Cath Foley:  N/A Code Status:  full code Last date of multidisciplinary goals of care discussion [patient and husband were updated at the bedside ]  Critical care time 48 minutes    Joneen Roach, AGACNP-BC [facility]  See Amion for personal pager PCCM on call pager [phone] until 7pm. Please call Elink 7p-7a. [phone]  03/17/2023 11:41 AM

## 2023-03-17 NOTE — Plan of Care (Signed)
  Problem: Education: Goal: Knowledge of General Education information will improve Description: Including pain rating scale, medication(s)/side effects and non-pharmacologic comfort measures Outcome: Progressing   Problem: Clinical Measurements: Goal: Will remain free from infection Outcome: Progressing Goal: Diagnostic test results will improve Outcome: Progressing Goal: Respiratory complications will improve Outcome: Progressing   Problem: Activity: Goal: Risk for activity intolerance will decrease Outcome: Progressing   Problem: Health Behavior/Discharge Planning: Goal: Ability to manage health-related needs will improve Outcome: Not Progressing

## 2023-03-17 NOTE — H&P (Deleted)
NAME:  Yvette Curry, MRN:  [id number], DOB:  March 01, 1956, LOS: 2 ADMISSION DATE:  03/15/2023, CONSULTATION DATE:  03/16/2023 REFERRING MD:  Preston Fleeting - ED [facility], CHIEF COMPLAINT:  Septic Shock   History of Present Illness:  67 year old woman who was transferred from [facility] for septic shock from suspected cellulitis.   She presented to [facility] on 11/24 with fever and lightheadedness for 2 days. Her right leg pain and erythema was worse than it had been earlier in the week.  She was treated as septic shock related to recurrent cellulitis with fluids, antibiotics and vasopressors.   She is currently undergoing chemotherapy for stage IV lung cancer with latest round last week, followed by Dr Shirline Frees.  She was admitted to [facility] with right leg cellulitis in late October and completed antibiotic therapy on 11/1. The pain and swelling were much improved and she was able to start ambulating unassisted again.   She may have sustained a small cat scratch on her right on 11/22.   On review of systems she reports some increased cough without a change in sputum production. Review is otherwise negative.   She was seen by Dr Myra Gianotti regarding her venous insufficiency on 11/18, and he had suggested laser vein ablation might help prevent further infection.   Pertinent  Medical History    has a past medical history of Anxiety, Depression, Dyspnea, Family history of adverse reaction to anesthesia, GERD (gastroesophageal reflux disease), Hypercholesteremia, Osteoarthritis, and stage IV lung ca (11/2021).    Significant Hospital Events: Including procedures, antibiotic start and stop dates in addition to other pertinent events   11/24 - admitted initially to the hospitalist service at [facility] 11/25 - transferred to [facility] once started on vasopressors.   Interim History / Subjective:  Feels a little better today. No longer lightheaded C/o RLE pain and forehead pain when coughing/sinus pressure.  Remains on NE.    Objective   Blood pressure (!) 82/66, pulse 90, temperature 99 F (37.2 C), temperature source Oral, resp. rate 20, height 5\' 1"  (1.549 m), weight 61 kg, SpO2 98%.        Intake/Output Summary (Last 24 hours) at 03/17/2023 1107 Last data filed at 03/17/2023 1019 Gross per 24 hour  Intake 2493.77 ml  Output 1450 ml  Net 1043.77 ml   Filed Weights   03/16/23 0653 03/17/23 0333  Weight: 68 kg 61 kg   Examination: General:Middle aged female in NAD HENT: [place]/AT, PERRL, no JVD Lungs: Clear bilateral breath sounds Cardiovascular: RRR, no MRG Abdomen: Soft, NT, ND Extremities: Bilateral lower extremity edema. R>L. Erythema of the RLE as well from ankle to knee. Outline marked. Tender to palpation. No crepitus.  Neuro: Alert, oriented, non-focal  Ancillary Tests:   CT chest shows small but increasing pleural effusions. Assessment & Plan:    Septic shock (HCC) due to cellulitis of the right leg. This is her third admission for the same issue. Never seems to fully resolve per the family. Also report of a cat scratch, but seems to have happened well after symptoms started.  - continue cefepime, vancomycin for empiric treatment - Will consult ID as infection is recurrent despite two hospitalizations and treatment courses.  - NE for MAP goal 65 - Midodrine  Emphysema lung (HCC) COPD without acute exacerbation Mild obstruction on PFT - continue home Breztri - PRN duoneb  Adenocarcinoma of right lung, stage 4 [facility]): widespread metastatic adenopathy to the ipsilateral hilum, bilateral mediastinum, bilateral neck, left axilla and  left mesentery diagnosed in August 2023. S/p SBRT to the right upper lobe. Current therapy is carboplatin for AUC of 5, Alimta 500 Mg/M2 and Keytruda 200 Mg IV every 3 weeks.  Status post 21 cycles. Last tx 11/20. - Supportive care for now  Acute hypoxic respiratory failure: small bilateral pleural effusions and COPD - Effusions quite small on prior imaging.  Likely too small to tap - repeat CXR in AM  Chronic combined systolic (congestive) and diastolic (congestive) heart failure  - Careful with volume - holding home antihypertensives.    Best Practice (right click and "Reselect all SmartList Selections" daily)   Diet/type: Regular consistency (see orders) DVT prophylaxis: enoxaparin (LOVENOX) injection 40 mg Start: 03/16/23 1000   Pressure ulcer(s): not present on admission  GI prophylaxis: N/A Lines: N/A patient has Port-a-Cath Foley:  N/A Code Status:  full code Last date of multidisciplinary goals of care discussion [patient and husband were updated at the bedside ]  Critical care time 48 minutes    Joneen Roach, AGACNP-BC [facility]  See Amion for personal pager PCCM on call pager [phone] until 7pm. Please call Elink 7p-7a. [phone]  03/17/2023 11:31 AM

## 2023-03-18 ENCOUNTER — Inpatient Hospital Stay (HOSPITAL_COMMUNITY): Payer: Medicare Other

## 2023-03-18 DIAGNOSIS — J9601 Acute respiratory failure with hypoxia: Secondary | ICD-10-CM | POA: Diagnosis not present

## 2023-03-18 DIAGNOSIS — A419 Sepsis, unspecified organism: Secondary | ICD-10-CM | POA: Diagnosis not present

## 2023-03-18 DIAGNOSIS — N179 Acute kidney failure, unspecified: Secondary | ICD-10-CM

## 2023-03-18 DIAGNOSIS — R6521 Severe sepsis with septic shock: Secondary | ICD-10-CM | POA: Diagnosis not present

## 2023-03-18 DIAGNOSIS — I5043 Acute on chronic combined systolic (congestive) and diastolic (congestive) heart failure: Secondary | ICD-10-CM | POA: Diagnosis not present

## 2023-03-18 LAB — BASIC METABOLIC PANEL WITH GFR
Anion gap: 7 (ref 5–15)
BUN: 27 mg/dL — ABNORMAL HIGH (ref 8–23)
CO2: 21 mmol/L — ABNORMAL LOW (ref 22–32)
Calcium: 9 mg/dL (ref 8.9–10.3)
Chloride: 108 mmol/L (ref 98–111)
Creatinine, Ser: 1.6 mg/dL — ABNORMAL HIGH (ref 0.44–1.00)
GFR, Estimated: 35 mL/min — ABNORMAL LOW (ref >60)
Glucose, Bld: 129 mg/dL — ABNORMAL HIGH (ref 70–99)
Potassium: 4.7 mmol/L (ref 3.5–5.1)
Sodium: 136 mmol/L (ref 135–145)

## 2023-03-18 LAB — CBC
HCT: 33.2 % — ABNORMAL LOW (ref 36.0–46.0)
Hemoglobin: 11.1 g/dL — ABNORMAL LOW (ref 12.0–15.0)
MCH: 37 pg — ABNORMAL HIGH (ref 26.0–34.0)
MCHC: 33.4 g/dL (ref 30.0–36.0)
MCV: 110.7 fL — ABNORMAL HIGH (ref 80.0–100.0)
Platelets: 68 K/uL — ABNORMAL LOW (ref 150–400)
RBC: 3 MIL/uL — ABNORMAL LOW (ref 3.87–5.11)
RDW: 15.2 % (ref 11.5–15.5)
WBC: 1.8 K/uL — ABNORMAL LOW (ref 4.0–10.5)
nRBC: 1.1 % — ABNORMAL HIGH (ref 0.0–0.2)

## 2023-03-18 LAB — PHOSPHORUS: Phosphorus: 2.6 mg/dL (ref 2.5–4.6)

## 2023-03-18 LAB — GLUCOSE, CAPILLARY: Glucose-Capillary: 100 mg/dL — ABNORMAL HIGH (ref 70–99)

## 2023-03-18 LAB — MAGNESIUM: Magnesium: 1.6 mg/dL — ABNORMAL LOW (ref 1.7–2.4)

## 2023-03-18 MED ORDER — WHITE PETROLATUM EX OINT: TOPICAL_OINTMENT | CUTANEOUS | Status: DC | PRN

## 2023-03-18 MED ORDER — SODIUM CHLORIDE 0.9 % IV SOLN: 2.0000 g | INTRAVENOUS | Status: DC

## 2023-03-18 MED ORDER — ENOXAPARIN SODIUM 30 MG/0.3ML IJ SOSY
30.0000 mg | PREFILLED_SYRINGE | INTRAMUSCULAR | Status: DC
  Administered 2023-03-19 – 2023-03-20 (×2): 30 mg via SUBCUTANEOUS
  Filled 2023-03-18 (×2): qty 0.3

## 2023-03-18 MED ORDER — VITAMIN C 500 MG PO TABS
500.0000 mg | ORAL_TABLET | Freq: Every day | ORAL | Status: DC
  Administered 2023-03-18 – 2023-03-20 (×3): 500 mg via ORAL
  Filled 2023-03-18 (×3): qty 1

## 2023-03-18 MED ORDER — MEGESTROL ACETATE 40 MG PO TABS
40.0000 mg | ORAL_TABLET | Freq: Every day | ORAL | Status: DC
  Administered 2023-03-18: 40 mg via ORAL
  Filled 2023-03-18 (×2): qty 1

## 2023-03-18 MED ORDER — ENSURE ENLIVE PO LIQD
237.0000 mL | Freq: Three times a day (TID) | ORAL | Status: DC
  Administered 2023-03-18 – 2023-03-19 (×4): 237 mL via ORAL

## 2023-03-18 MED ORDER — MAGNESIUM SULFATE 2 GM/50ML IV SOLN
2.0000 g | Freq: Once | INTRAVENOUS | Status: AC
  Administered 2023-03-18: 2 g via INTRAVENOUS
  Filled 2023-03-18: qty 50

## 2023-03-18 MED ORDER — CEFADROXIL 500 MG PO CAPS
1000.0000 mg | ORAL_CAPSULE | Freq: Two times a day (BID) | ORAL | Status: DC
  Filled 2023-03-18: qty 2

## 2023-03-18 MED ORDER — ALBUMIN HUMAN 25 % IV SOLN
25.0000 g | Freq: Four times a day (QID) | INTRAVENOUS | Status: AC
  Administered 2023-03-18 – 2023-03-19 (×4): 25 g via INTRAVENOUS
  Filled 2023-03-18 (×4): qty 100

## 2023-03-18 MED ORDER — DOXYCYCLINE HYCLATE 100 MG PO TABS
100.0000 mg | ORAL_TABLET | Freq: Two times a day (BID) | ORAL | Status: DC
  Administered 2023-03-18 – 2023-03-20 (×5): 100 mg via ORAL
  Filled 2023-03-18 (×5): qty 1

## 2023-03-18 MED ORDER — HYDROXYZINE HCL 10 MG PO TABS: 10.0000 mg | ORAL_TABLET | Freq: Three times a day (TID) | ORAL | Status: DC | PRN

## 2023-03-18 MED ORDER — CEFADROXIL 500 MG PO CAPS
1000.0000 mg | ORAL_CAPSULE | Freq: Every day | ORAL | Status: DC
  Administered 2023-03-18: 1000 mg via ORAL
  Filled 2023-03-18 (×2): qty 2

## 2023-03-18 MED ORDER — HYDROCERIN EX CREA
TOPICAL_CREAM | Freq: Two times a day (BID) | CUTANEOUS | Status: DC
  Filled 2023-03-18: qty 113

## 2023-03-18 MED ORDER — VITAMIN C 500 MG PO TABS: 500.0000 mg | ORAL_TABLET | Freq: Every day | ORAL | Status: DC

## 2023-03-18 NOTE — Progress Notes (Signed)
This RN noticed port-a-cath dressing compromised. Stat IV team consult in for dressing exchange.

## 2023-03-18 NOTE — Consult Note (Addendum)
[facility]    Date of Admission:  03/15/2023   Total days of inpatient antibiotics 2        Reason for Consult: RLE cellulites    Principal Problem:   Septic shock (HCC) Active Problems:   Adenocarcinoma of right lung, stage 4 (HCC)   Emphysema lung (HCC)   CAD (coronary artery disease)   Cellulitis   Acute hypoxic respiratory failure (HCC)   Acute on chronic combined systolic (congestive) and diastolic (congestive) heart failure [facility])   Assessment: 67 year old female with history of stage IV lung cancer on chemotherapy follow-up with Dr. Arbutus Ped, right lower extremity cellulitis, vascular stasis follow-up with vascular admitted for #Right lower extremity cellulitis 9 suppressed patient - Started on cefepime and vancomycin.  Patient had a Tmax of 103.1. - On exam right leg does look erythematous. - Patient states that she had already started feels sick in the leg had gotten red prior to the cat scratching the wound. - She is concerned about repeated cellulitis episode on right lower extremities.  She does have some scaly's/broken skin in the setting of venous stasis.  Recommendations:  -Okay to continue azithromycin - If blood cultures are negative we can transition to Bactrim and Augmentin.  Bactrim will cover cat stretch and MRSA - Follow-up blood cultures - Monitor clinically - I discussed that prevention is needed this case.  Patient states that she has been seen by vascular for possible vascular procedure.  Continue wound care which is persistent and optimization from a vascular perspective would help in mention of future episodes cellulitis. Microbiology:   Antibiotics: Azithromycin 9/26 Cefepime 11/24-present  vancomycin 11/24-present Metronidazole 9/24-present Cultures: Blood 11/24 no growth Urine 1/24 multiple species cefepime  Other   HPI: Yvette Curry is a 67 y.o. female with past medical history emphysema, stage IV  small cell lung cancer on chemotherapy with (Carboplatin, Alimta, and Keytruda for 20 cycles ) prior history of cellulitis treated with Doxy and cefadroxil with vascular consult for venous stasis, followed by Dr. Arbutus Ped oncology, GERD, upper cholesterolemia admitted for right lower extremity cellulitis.  On arrival to ED Tmax nine 9.5.  Patient had WBC of 6.8 K.  ID engaged for antibiotic recommendations.   Review of Systems: Review of Systems  All other systems reviewed and are negative.   Past Medical History:  Diagnosis Date   Anxiety    no current tx   Depression    no meds at present   Dyspnea    Family history of adverse reaction to anesthesia    pt states mom had allergic reaction to some unknown anesthesia   GERD (gastroesophageal reflux disease)    no tx since weight loss   Hypercholesteremia    Osteoarthritis    stage IV lung ca 11/2021    Social History   Tobacco Use   Smoking status: Every Day    Current packs/day: 0.50    Average packs/day: 0.5 packs/day for 37.0 years (18.5 ttl pk-yrs)    Types: Cigarettes    Passive exposure: Current   Smokeless tobacco: Never   Tobacco comments:    Smokes half a pack of cigarettes a day. 12/18/2022 Tay  Vaping Use   Vaping status: Former  Substance Use Topics   Alcohol use: No   Drug use: No    Family History  Problem Relation Age of Onset   Heart disease Mother    Kidney cancer Mother  Emphysema Father    Heart disease Father    Colon cancer Neg Hx    Scheduled Meds:  ascorbic acid  500 mg Oral Daily   azithromycin  250 mg Oral Daily   Budeson-Glycopyrrol-Formoterol  2 puff Inhalation BID   Chlorhexidine Gluconate Cloth  6 each Topical Daily   enoxaparin (LOVENOX) injection  40 mg Subcutaneous Q24H   folic acid  1 mg Oral Daily   gabapentin  200 mg Oral BID   midodrine  10 mg Oral TID WC   Continuous Infusions:  albumin human     ceFEPime (MAXIPIME) IV Stopped (03/17/23 2103)   magnesium sulfate bolus  IVPB     norepinephrine (LEVOPHED) Adult infusion 4 mcg/min (03/18/23 0700)   vancomycin Stopped (03/17/23 2307)   PRN Meds:.acetaminophen **OR** acetaminophen, ipratropium-albuterol, ondansetron **OR** ondansetron (ZOFRAN) IV, mouth rinse, polyethylene glycol Allergies  Allergen Reactions   Lidocaine Shortness Of Breath and Anxiety    Patient felt like she "couldn't breathe", panicky Allergic to all " caines"   Mepivacaine Swelling    angioedema   Demerol Nausea And Vomiting   Prednisone Hives and Nausea And Vomiting    abd pain and vomiting, Hives    Sulfa Antibiotics Hives    Hives, swelling and itching    OBJECTIVE: Blood pressure (!) 92/52, pulse 88, temperature 98.8 F (37.1 C), temperature source Oral, resp. rate (!) 29, height 5\' 1"  (1.549 m), weight 61 kg, SpO2 100%.  Physical Exam Constitutional:      Appearance: Normal appearance.  HENT:     Head: Normocephalic and atraumatic.     Right Ear: Tympanic membrane normal.     Left Ear: Tympanic membrane normal.     Nose: Nose normal.     Mouth/Throat:     Mouth: Mucous membranes are moist.  Eyes:     Extraocular Movements: Extraocular movements intact.     Conjunctiva/sclera: Conjunctivae normal.     Pupils: Pupils are equal, round, and reactive to light.  Cardiovascular:     Rate and Rhythm: Normal rate and regular rhythm.     Heart sounds: No murmur heard.    No friction rub. No gallop.  Pulmonary:     Effort: Pulmonary effort is normal.     Breath sounds: Normal breath sounds.  Abdominal:     General: Abdomen is flat.     Palpations: Abdomen is soft.  Musculoskeletal:        General: Normal range of motion.  Skin:    General: Skin is warm and dry.  Neurological:     General: No focal deficit present.     Mental Status: She is alert and oriented to person, place, and time.  Psychiatric:        Mood and Affect: Mood normal.     Lab Results Lab Results  Component Value Date   WBC 1.8 (L) 03/18/2023    HGB 11.1 (L) 03/18/2023   HCT 33.2 (L) 03/18/2023   MCV 110.7 (H) 03/18/2023   PLT 68 (L) 03/18/2023    Lab Results  Component Value Date   CREATININE 1.60 (H) 03/18/2023   BUN 27 (H) 03/18/2023   NA 136 03/18/2023   K 4.7 03/18/2023   CL 108 03/18/2023   CO2 21 (L) 03/18/2023    Lab Results  Component Value Date   ALT 15 03/15/2023   AST 28 03/15/2023   ALKPHOS 49 03/15/2023   BILITOT 1.1 03/15/2023       Danelle Earthly, MD  [facility] 03/18/2023, 8:43 AM I have personally spent 82 minutes involved in face-to-face and non-face-to-face activities for this patient on the day of the visit. Professional time spent includes the following activities: Preparing to see the patient (review of tests), Obtaining and/or reviewing separately obtained history (admission/discharge record), Performing a medically appropriate examination and/or evaluation , Ordering medications/tests/procedures, referring and communicating with other health care professionals, Documenting clinical information in the EMR, Independently interpreting results (not separately reported), Communicating results to the patient/family/caregiver, Counseling and educating the patient/family/caregiver and Care coordination (not separately reported).

## 2023-03-18 NOTE — Progress Notes (Signed)
[facility]  Date of Admission:  03/15/2023      Total days of antibiotics 4  Vancomycin  Cefepime   Azithromycin 11/26           ASSESSMENT: Yvette Curry is a 67 y.o. female admitted with:  Cellulitis of RLE, Recurrent -  SIRS / Septic Shock -  ?Cat Scratch Trauma - This is her 3rd hospitalization for cellulitis of the right lower extremity. I am not sure her second hospital stay was truly refractory infection or more out of concern for an immunocompromised host with persistent pain. Current hospital stay more clear this is new infection of the RLE.  With her lipo dermatosclerosis/venous insufficiency and peripheral neuropathy it will likely be more difficult to discern infection from chronic medical conditions.  *Regarding the question for suppression - I have not seen suppression proven to be helpful and patients break through frequently regarding low dose oral antibiotics.  *I do think she probably needs a longer course of 10-14 days of antibiotics given significant stasis/lymphedema and immunocompromised state.  She may be a candidate for on demand therapy where she has access to Doxycycline + Cefadroxil 1gm BID to start at home at the onset of fevers/chills + local changes to the affected skin with close follow up with outpatient PCP team.  -Leg is still painful (stinging) but not nearly as red vs yesterday indicating a good response -ABX changed to Doxy + Cefadroxil. -Stop azithro as the doxy will cover CSD (though no real suspicion for this here) -Chronic swelling management measures have been undertaken to help prevent recurrent episodes.   Lipo dermatosclerosis, Lymphedema / Venous Insufficiency -  Working with vascular team outpatient. Has venous reflux and considering ablation to see if this helps to reduce the risk of recurrent cellulitis.  Current hospitalization has paused this for the moment but she is considering moving  forward after discussions with oncology team more.  -I don't think it will reverse the skin changes as these are often permanent.  -I do have confidence that it may help the swelling and in turn decrease the risk of recurrent cellulitis.  -Continue supportive care for skin barrier with topical emmollient / lotions.   Diffuse Coalesced Red Rash -  Possible antibiotic related vs CHG bath dermatitis (though redness is on her face and I don't think we use CHG cloths on patients face.  -Switching off vancomycin (which she has tolerated well in the past) to doxycycline PO -Switching off cefepime (she received this at second hospital stay in October as well) to unasyn IV -?azithromycin maybe   Stage IV Lung Cancer -  In care with Dr. Amador Cunas a cath in place -> blood cultures remain negative.  -per oncology team   Pancytopenia -  Infection, chemotherapy related. Initial WBC 6.8 and down to 1.8. PLT < 100K as well -linezolid is unfortunately not a good option for her and with CKD would be high risk for myelosuppressive s/e    PLAN: Doxycyline + Cefadroxil 500 mg (renally adjusted) BID to finish out 14 day course EOT 12/7 Would consider on demand therapy for her to have on hand of the above after she completes so she can start antibiotics quickly in hopes to avoid hospitalization  (PCP can help manage with ID support outpatient if needed as they would prefer)  Supportive care for leg - elevation, cool compresses, analgesics as currently managed. Emollients / unscented thick lotions  for skin barrier  Compression as she can tolerate  FU with vascular outpatient for further ablation discussion    Principal Problem:   Septic shock (HCC) Active Problems:   Adenocarcinoma of right lung, stage 4 (HCC)   Emphysema lung (HCC)   CAD (coronary artery disease)   Cellulitis   Acute hypoxic respiratory failure (HCC)   Acute on chronic combined systolic (congestive) and diastolic (congestive) heart  failure (HCC)    ascorbic acid  500 mg Oral Daily   Budeson-Glycopyrrol-Formoterol  2 puff Inhalation BID   cefadroxil  1,000 mg Oral Daily   Chlorhexidine Gluconate Cloth  6 each Topical Daily   doxycycline  100 mg Oral Q12H   [START ON 03/19/2023] enoxaparin (LOVENOX) injection  30 mg Subcutaneous Q24H   folic acid  1 mg Oral Daily   gabapentin  200 mg Oral BID   midodrine  10 mg Oral TID WC    SUBJECTIVE: More diffusely red all over today. Blanches. Sandpaper texture. Itchy. Unclear as to when it started but it seems worse now. Still painful on the RLE.   Received Vancomycin + Rocephin with good effect on cellulitis October 16 and was discharged on Doxy + Omnicef.  Quick re-admission 10/22 - 10/25 with persistent tachycardia, leukopenia pain and redness. No fevers described in this encounter. She completed a course of oral doxycycline + cefadroxil EOT 11/1 with improvement. Swelling never 100% resolves and the flaking to the skin has been worse lately and quite itchy. Has been using Udder Cream topically to help with skin barrier.    Review of Systems: Review of Systems  Constitutional:  Negative for chills and fever.  Musculoskeletal:        TTP RLE  Skin:  Positive for itching and rash.  Neurological:  Positive for sensory change.     Allergies  Allergen Reactions   Lidocaine Shortness Of Breath and Anxiety    Patient felt like she "couldn't breathe", panicky Allergic to all " caines"   Mepivacaine Swelling    angioedema   Demerol Nausea And Vomiting   Prednisone Hives and Nausea And Vomiting    abd pain and vomiting, Hives    Sulfa Antibiotics Hives    Hives, swelling and itching    OBJECTIVE: Vitals:   03/18/23 1100 03/18/23 1112 03/18/23 1115 03/18/23 1130  BP: 116/65  101/71 115/70  Pulse: 98  99 97  Resp: 19  15 16   Temp:  99.1 F (37.3 C)    TempSrc:  Oral    SpO2: 99%  97% 99%  Weight:      Height:       Body mass index is 25.41 kg/m.  Physical  Exam Vitals reviewed.  Constitutional:      Appearance: Normal appearance. She is not ill-appearing.  HENT:     Mouth/Throat:     Mouth: Mucous membranes are moist.     Pharynx: Oropharynx is clear.  Eyes:     General: No scleral icterus. Cardiovascular:     Rate and Rhythm: Normal rate and regular rhythm.  Pulmonary:     Effort: Pulmonary effort is normal.  Musculoskeletal:        General: Swelling and tenderness present.     Right lower leg: Edema present.     Left lower leg: Edema present.     Comments: RLE with patches of hyperpigmented scales on anterior shin, posterior calf and hyperpigmented skin around right lateral ankle c/w lymphedema.  Diffuse blanchable redness all over  which seems pretty equal in the RLE specifically in the context of possible diffuse drug reaction.   Neurological:     Mental Status: She is oriented to person, place, and time.  Psychiatric:        Mood and Affect: Mood normal.        Thought Content: Thought content normal.     Lab Results Lab Results  Component Value Date   WBC 1.8 (L) 03/18/2023   HGB 11.1 (L) 03/18/2023   HCT 33.2 (L) 03/18/2023   MCV 110.7 (H) 03/18/2023   PLT 68 (L) 03/18/2023    Lab Results  Component Value Date   CREATININE 1.60 (H) 03/18/2023   BUN 27 (H) 03/18/2023   NA 136 03/18/2023   K 4.7 03/18/2023   CL 108 03/18/2023   CO2 21 (L) 03/18/2023    Lab Results  Component Value Date   ALT 15 03/15/2023   AST 28 03/15/2023   ALKPHOS 49 03/15/2023   BILITOT 1.1 03/15/2023     Microbiology: Recent Results (from the past 240 hour(s))  Culture, blood (Routine x 2)     Status: None (Preliminary result)   Collection Time: 03/15/23  6:43 PM   Specimen: Right Antecubital; Blood  Result Value Ref Range Status   Specimen Description RIGHT ANTECUBITAL  Final   Special Requests   Final    BOTTLES DRAWN AEROBIC AND ANAEROBIC Blood Culture results may not be optimal due to an excessive volume of blood received in  culture bottles   Culture   Final    NO GROWTH 2 DAYS Performed at [facility], [address] [place], [place] [address]    Report Status PENDING  Incomplete  Culture, blood (Routine x 2)     Status: None (Preliminary result)   Collection Time: 03/15/23  6:55 PM   Specimen: Porta Cath; Blood  Result Value Ref Range Status   Specimen Description PORTA CATH  Final   Special Requests   Final    BOTTLES DRAWN AEROBIC AND ANAEROBIC Blood Culture adequate volume   Culture   Final    NO GROWTH 2 DAYS Performed at [facility], [address], [place], [place] [address]    Report Status PENDING  Incomplete  Urine Culture (for pregnant, neutropenic or urologic patients or patients with an indwelling urinary catheter)     Status: Abnormal   Collection Time: 03/15/23  8:08 PM   Specimen: Urine, Clean Catch  Result Value Ref Range Status   Specimen Description   Final    URINE, CLEAN CATCH Performed at [facility], [address] [place], [place] [address]    Special Requests   Final    NONE Performed at [facility], [address], [place], [place] [address]    Culture MULTIPLE SPECIES PRESENT, SUGGEST RECOLLECTION (A)  Final   Report Status 03/17/2023 FINAL  Final  MRSA Next Gen by PCR, Nasal     Status: None   Collection Time: 03/15/23  8:29 PM   Specimen: Nasal Mucosa; Nasal Swab  Result Value Ref Range Status   MRSA by PCR Next Gen NOT DETECTED NOT DETECTED Final    Comment: (NOTE) The GeneXpert MRSA Assay (FDA approved for NASAL specimens only), is one component of a comprehensive MRSA colonization surveillance program. It is not intended to diagnose MRSA infection nor to guide or monitor treatment for MRSA infections. Test performance is not FDA approved in patients less than 27 years old. Performed at [facility],  [address], [place], [place] [address]   Resp panel by RT-PCR (RSV, Flu A&B, Covid) Anterior Nasal Swab     Status: None   Collection Time: 03/15/23  9:30 PM    Specimen: Anterior Nasal Swab  Result Value Ref Range Status   SARS Coronavirus 2 by RT PCR NEGATIVE NEGATIVE Final    Comment: (NOTE) SARS-CoV-2 target nucleic acids are NOT DETECTED.  The SARS-CoV-2 RNA is generally detectable in upper respiratory specimens during the acute phase of infection. The lowest concentration of SARS-CoV-2 viral copies this assay can detect is 138 copies/mL. A negative result does not preclude SARS-Cov-2 infection and should not be used as the sole basis for treatment or other patient management decisions. A negative result may occur with  improper specimen collection/handling, submission of specimen other than nasopharyngeal swab, presence of viral mutation(s) within the areas targeted by this assay, and inadequate number of viral copies(<138 copies/mL). A negative result must be combined with clinical observations, patient history, and epidemiological information. The expected result is Negative.  Fact Sheet for Patients:  BloggerCourse.com  Fact Sheet for Healthcare Providers:  SeriousBroker.it  This test is no t yet approved or cleared by the [place] FDA and  has been authorized for detection and/or diagnosis of SARS-CoV-2 by FDA under an Emergency Use Authorization (EUA). This EUA will remain  in effect (meaning this test can be used) for the duration of the COVID-19 declaration under Section 564(b)(1) of the Act, 21 U.S.C.section 360bbb-3(b)(1), unless the authorization is terminated  or revoked sooner.       Influenza A by PCR NEGATIVE NEGATIVE Final   Influenza B by PCR NEGATIVE NEGATIVE Final    Comment: (NOTE) The Xpert Xpress SARS-CoV-2/FLU/RSV plus assay is intended as an aid in the diagnosis of influenza from Nasopharyngeal swab specimens and should not be used as a sole basis for treatment. Nasal washings and aspirates are unacceptable for Xpert Xpress  SARS-CoV-2/FLU/RSV testing.  Fact Sheet for Patients: BloggerCourse.com  Fact Sheet for Healthcare Providers: SeriousBroker.it  This test is not yet approved or cleared by the [place] FDA and has been authorized for detection and/or diagnosis of SARS-CoV-2 by FDA under an Emergency Use Authorization (EUA). This EUA will remain in effect (meaning this test can be used) for the duration of the COVID-19 declaration under Section 564(b)(1) of the Act, 21 U.S.C. section 360bbb-3(b)(1), unless the authorization is terminated or revoked.     Resp Syncytial Virus by PCR NEGATIVE NEGATIVE Final    Comment: (NOTE) Fact Sheet for Patients: BloggerCourse.com  Fact Sheet for Healthcare Providers: SeriousBroker.it  This test is not yet approved or cleared by the [place] FDA and has been authorized for detection and/or diagnosis of SARS-CoV-2 by FDA under an Emergency Use Authorization (EUA). This EUA will remain in effect (meaning this test can be used) for the duration of the COVID-19 declaration under Section 564(b)(1) of the Act, 21 U.S.C. section 360bbb-3(b)(1), unless the authorization is terminated or revoked.  Performed at [facility], [address] [place], [place] [address]     Rexene Alberts, MSN, NP-C [facility] Pager: [phone]  @TODAY @ 1:09 PM

## 2023-03-18 NOTE — Progress Notes (Addendum)
NAME:  Yvette Curry, MRN:  [id number], DOB:  January 02, 1956, LOS: 3 ADMISSION DATE:  03/15/2023, CONSULTATION DATE:  03/16/2023 REFERRING MD:  Preston Fleeting - ED [facility], CHIEF COMPLAINT:  Septic Shock   History of Present Illness:  67 year old woman who was transferred from [facility] for septic shock from suspected cellulitis.   She presented to [facility] on 11/24 with fever and lightheadedness for 2 days. Her right leg pain and erythema was worse than it had been earlier in the week. She was treated as septic shock related to recurrent cellulitis with fluids, antibiotics and vasopressors.   She is currently undergoing chemotherapy for stage IV lung cancer with latest round last week, followed by Dr Shirline Frees.  She was admitted to [facility] with right leg cellulitis in late October and completed antibiotic therapy on 11/1. The pain and swelling were much improved and she was able to start ambulating unassisted again. She may have sustained a small cat scratch on her right on 11/22.   On review of systems she reports some increased cough without a change in sputum production. Review is otherwise negative.   She was seen by Dr Myra Gianotti regarding her venous insufficiency on 11/18, and he had suggested laser vein ablation might help prevent further infection.   Pertinent  Medical History    has a past medical history of Anxiety, Depression, Dyspnea, Family history of adverse reaction to anesthesia, GERD (gastroesophageal reflux disease), Hypercholesteremia, Osteoarthritis, and stage IV lung ca (11/2021).   Significant Hospital Events: Including procedures, antibiotic start and stop dates in addition to other pertinent events   11/24 - admitted initially to the hospitalist service at [facility] 11/25 - transferred to [facility] once started on vasopressors.  11/27 -no acute events overnight, remains on low-dose vasopressors  Interim History / Subjective:  Alert and interactive this morning, continues to complain of right lower extremity  tenderness and warmth  Objective   Blood pressure (!) 92/52, pulse 88, temperature 98.8 F (37.1 C), temperature source Oral, resp. rate (!) 29, height 5\' 1"  (1.549 m), weight 61 kg, SpO2 100%.        Intake/Output Summary (Last 24 hours) at 03/18/2023 0750 Last data filed at 03/18/2023 0700 Gross per 24 hour  Intake 1488.48 ml  Output 1150 ml  Net 338.48 ml   Filed Weights   03/16/23 0653 03/17/23 0333  Weight: 68 kg 61 kg   Examination: General: Acute on chronic ill-appearing middle-aged female lying in bed in no acute distress HEENT: [place]/AT, MM pink/moist, PERRL,  Neuro: Alert and oriented x 3, nonfocal CV: s1s2 regular rate and rhythm, no murmur, rubs, or gallops,  PULM: Clear to auscultation bilaterally, no increased work of breathing, no added breath sounds GI: soft, bowel sounds active in all 4 quadrants, non-tender, non-distended, tolerating oral diet Extremities: Erythema and warmth to right lower extremity mostly posterior Skin: no rashes or lesions  Resolved problems    Assessment & Plan:   Septic shock (HCC) due to cellulitis of the right leg.  -This is her third admission for the same issue. Never seems to fully resolve per the family. Also report of a cat scratch, but seems to have happened well after symptoms started.  -She has been offered laser ablation of the saphenous vein to reduce cellulitis risk by vascular surgery as an outpatient.  P: ID following, appreciate assistance Continue cefepime and vancomycin Wean pressors for MAP goal greater than 65 Continue oral midodrine Add scheduled albumin  Acute hypoxic respiratory failure: -  Small bilateral pleural effusions  COPD without acute exacerbation -Mild obstruction on PFT Adenocarcinoma of right lung, stage 4 (HCC) -Widespread metastatic adenopathy to the ipsilateral hilum, bilateral mediastinum, bilateral neck, left axilla and left mesentery diagnosed in August 2023. S/p SBRT to the right upper lobe.  Current therapy is carboplatin for AUC of 5, Alimta 500 Mg/M2 and Keytruda 200 Mg IV every 3 weeks.  Status post 21 cycles. Last tx 11/20. P: Continue home Breztri As needed DuoNebs Aspiration precautions Supportive care Oncology follow-up Follow as needed chest x-rays  Chronic combined systolic (congestive) and diastolic (congestive) heart failure  P: Strict intake and output Daily weight Assess for need to diurese GDMT as able Continuous telemetry  Acute Kidney Injury  -Creatinine 1.60 with GFR 35 as of 11/27, compared to creatinine 0.95 with GFR greater than 50 October 2024 P: Follow renal function  Monitor urine output Trend Bmet Avoid nephrotoxins Ensure adequate renal perfusion  Encourage oral hydration  Hypomagnesemia P: Supplement  Thrombocytopenia  -Likely secondary to Keytruda  P: Continue prophylactic Lovenox Hold if Plt drop below 10 Monitor for bleeding   Best Practice (right click and "Reselect all SmartList Selections" daily)   Diet/type: Regular consistency (see orders) DVT prophylaxis: enoxaparin (LOVENOX) injection 40 mg Start: 03/16/23 1000   Pressure ulcer(s): not present on admission  GI prophylaxis: N/A Lines: N/A patient has Port-a-Cath Foley:  N/A Code Status:  full code Last date of multidisciplinary goals of care discussion [patient and husband were updated at the bedside ]   CRITICAL CARE Performed by: Calah Gershman D. Harris  Total critical care time: 37 minutes  Critical care time was exclusive of separately billable procedures and treating other patients.  Critical care was necessary to treat or prevent imminent or life-threatening deterioration.  Critical care was time spent personally by me on the following activities: development of treatment plan with patient and/or surrogate as well as nursing, discussions with consultants, evaluation of patient's response to treatment, examination of patient, obtaining history from patient or  surrogate, ordering and performing treatments and interventions, ordering and review of laboratory studies, ordering and review of radiographic studies, pulse oximetry and re-evaluation of patient's condition.  Luva Metzger D. Harris, NP-C [facility] Personal contact information can be found on Amion  If no contact or response made please call [phone] 03/18/2023, 7:55 AM

## 2023-03-19 DIAGNOSIS — I5043 Acute on chronic combined systolic (congestive) and diastolic (congestive) heart failure: Secondary | ICD-10-CM | POA: Diagnosis not present

## 2023-03-19 DIAGNOSIS — A419 Sepsis, unspecified organism: Secondary | ICD-10-CM | POA: Diagnosis not present

## 2023-03-19 DIAGNOSIS — J9601 Acute respiratory failure with hypoxia: Secondary | ICD-10-CM | POA: Diagnosis not present

## 2023-03-19 DIAGNOSIS — R6521 Severe sepsis with septic shock: Secondary | ICD-10-CM | POA: Diagnosis not present

## 2023-03-19 LAB — CBC WITH DIFFERENTIAL/PLATELET
Abs Immature Granulocytes: 0 10*3/uL (ref 0.00–0.07)
Abs Immature Granulocytes: 0.01 10*3/uL (ref 0.00–0.07)
Basophils Absolute: 0 10*3/uL (ref 0.0–0.1)
Basophils Absolute: 0 10*3/uL (ref 0.0–0.1)
Basophils Relative: 0 %
Basophils Relative: 0 %
Eosinophils Absolute: 0.2 10*3/uL (ref 0.0–0.5)
Eosinophils Absolute: 0.2 10*3/uL (ref 0.0–0.5)
Eosinophils Relative: 11 %
Eosinophils Relative: 13 %
HCT: 23.6 % — ABNORMAL LOW (ref 36.0–46.0)
HCT: 26.2 % — ABNORMAL LOW (ref 36.0–46.0)
Hemoglobin: 7.8 g/dL — ABNORMAL LOW (ref 12.0–15.0)
Hemoglobin: 8.6 g/dL — ABNORMAL LOW (ref 12.0–15.0)
Immature Granulocytes: 1 %
Lymphocytes Relative: 34 %
Lymphocytes Relative: 31 %
Lymphs Abs: 0.5 10*3/uL — ABNORMAL LOW (ref 0.7–4.0)
Lymphs Abs: 0.5 10*3/uL — ABNORMAL LOW (ref 0.7–4.0)
MCH: 36.8 pg — ABNORMAL HIGH (ref 26.0–34.0)
MCH: 36.8 pg — ABNORMAL HIGH (ref 26.0–34.0)
MCHC: 33.1 g/dL (ref 30.0–36.0)
MCHC: 32.8 g/dL (ref 30.0–36.0)
MCV: 111.3 fL — ABNORMAL HIGH (ref 80.0–100.0)
MCV: 112 fL — ABNORMAL HIGH (ref 80.0–100.0)
Monocytes Absolute: 0.1 10*3/uL (ref 0.1–1.0)
Monocytes Absolute: 0.3 10*3/uL (ref 0.1–1.0)
Monocytes Relative: 4 %
Monocytes Relative: 15 %
Neutro Abs: 0.8 10*3/uL — ABNORMAL LOW (ref 1.7–7.7)
Neutro Abs: 0.7 10*3/uL — ABNORMAL LOW (ref 1.7–7.7)
Neutrophils Relative %: 51 %
Neutrophils Relative %: 40 %
Platelets: 61 10*3/uL — ABNORMAL LOW (ref 150–400)
Platelets: 63 10*3/uL — ABNORMAL LOW (ref 150–400)
RBC: 2.12 MIL/uL — ABNORMAL LOW (ref 3.87–5.11)
RBC: 2.34 MIL/uL — ABNORMAL LOW (ref 3.87–5.11)
RDW: 15.3 % (ref 11.5–15.5)
RDW: 15.3 % (ref 11.5–15.5)
WBC: 1.6 10*3/uL — ABNORMAL LOW (ref 4.0–10.5)
WBC: 1.6 10*3/uL — ABNORMAL LOW (ref 4.0–10.5)
nRBC: 0 % (ref 0.0–0.2)
nRBC: 0 /100{WBCs}
nRBC: 0 % (ref 0.0–0.2)

## 2023-03-19 LAB — BASIC METABOLIC PANEL
Anion gap: 5 (ref 5–15)
BUN: 24 mg/dL — ABNORMAL HIGH (ref 8–23)
CO2: 23 mmol/L (ref 22–32)
Calcium: 9.5 mg/dL (ref 8.9–10.3)
Chloride: 110 mmol/L (ref 98–111)
Creatinine, Ser: 1.7 mg/dL — ABNORMAL HIGH (ref 0.44–1.00)
GFR, Estimated: 33 mL/min — ABNORMAL LOW (ref >60)
Glucose, Bld: 113 mg/dL — ABNORMAL HIGH (ref 70–99)
Potassium: 4.5 mmol/L (ref 3.5–5.1)
Sodium: 138 mmol/L (ref 135–145)

## 2023-03-19 LAB — GLUCOSE, CAPILLARY
Glucose-Capillary: 104 mg/dL — ABNORMAL HIGH (ref 70–99)
Glucose-Capillary: 109 mg/dL — ABNORMAL HIGH (ref 70–99)

## 2023-03-19 MED ORDER — SODIUM CHLORIDE 0.9 % IV SOLN: 1.0000 g | INTRAVENOUS | Status: DC

## 2023-03-19 MED ORDER — FUROSEMIDE 10 MG/ML IJ SOLN
60.0000 mg | Freq: Once | INTRAMUSCULAR | Status: AC
  Administered 2023-03-19: 60 mg via INTRAVENOUS
  Filled 2023-03-19: qty 6

## 2023-03-19 MED ORDER — CEFADROXIL 500 MG PO CAPS: 1000.0000 mg | ORAL_CAPSULE | Freq: Every day | ORAL | Status: DC

## 2023-03-19 MED ORDER — CEFADROXIL 500 MG PO CAPS
1000.0000 mg | ORAL_CAPSULE | Freq: Two times a day (BID) | ORAL | Status: DC
  Administered 2023-03-19: 1000 mg via ORAL

## 2023-03-19 MED ORDER — AMOXICILLIN-POT CLAVULANATE 500-125 MG PO TABS
1.0000 | ORAL_TABLET | Freq: Two times a day (BID) | ORAL | Status: DC
  Administered 2023-03-19 – 2023-03-20 (×2): 1 via ORAL
  Filled 2023-03-19 (×3): qty 1

## 2023-03-19 NOTE — Progress Notes (Signed)
Pt transferred to 5W room 30, without any complication, all belongings place at bed side.

## 2023-03-19 NOTE — Progress Notes (Addendum)
Patient with progressive pain on LLE, similar to RLE cellulitis pain. This RN noted more redness/discoloration to LLE than in beginning of the shift, also similar to RLE redness and discoloration. Pictures taken and uploaded in chart. Patient also with progressive cough throughout the night. Had produced a very small amount of blood tinged mucus once and then clear mucus every time following. Elink MD notifed of all changes.

## 2023-03-19 NOTE — Progress Notes (Signed)
NAME:  Yvette MANDATO, MRN:  [id number], DOB:  29-Dec-1955, LOS: 4 ADMISSION DATE:  03/15/2023, CONSULTATION DATE:  03/16/2023 REFERRING MD:  Preston Fleeting - ED [facility], CHIEF COMPLAINT:  Septic Shock   History of Present Illness:  67 year old woman who was transferred from [facility] for septic shock from suspected cellulitis.   She presented to [facility] on 11/24 with fever and lightheadedness for 2 days. Her right leg pain and erythema was worse than it had been earlier in the week. She was treated as septic shock related to recurrent cellulitis with fluids, antibiotics and vasopressors.   She is currently undergoing chemotherapy for stage IV lung cancer with latest round last week, followed by Dr Shirline Frees.  She was admitted to [facility] with right leg cellulitis in late October and completed antibiotic therapy on 11/1. The pain and swelling were much improved and she was able to start ambulating unassisted again. She may have sustained a small cat scratch on her right on 11/22.   On review of systems she reports some increased cough without a change in sputum production. Review is otherwise negative.   She was seen by Dr Myra Gianotti regarding her venous insufficiency on 11/18, and he had suggested laser vein ablation might help prevent further infection.   Pertinent  Medical History    has a past medical history of Anxiety, Depression, Dyspnea, Family history of adverse reaction to anesthesia, GERD (gastroesophageal reflux disease), Hypercholesteremia, Osteoarthritis, and stage IV lung ca (11/2021).   Significant Hospital Events: Including procedures, antibiotic start and stop dates in addition to other pertinent events   11/24 - admitted initially to the hospitalist service at [facility] 11/25 - transferred to [facility] once started on vasopressors.  11/27 -no acute events overnight, remains on low-dose vasopressors  Interim History / Subjective:  Patient came off of vasopressor support Serum creatinine continue to rise No  overnight issues  Objective   Blood pressure 106/62, pulse 89, temperature 97.7 F (36.5 C), temperature source Oral, resp. rate 18, height 5\' 1"  (1.549 m), weight 61 kg, SpO2 93%.        Intake/Output Summary (Last 24 hours) at 03/19/2023 9562 Last data filed at 03/19/2023 0500 Gross per 24 hour  Intake 527.52 ml  Output --  Net 527.52 ml   Filed Weights   03/16/23 0653 03/17/23 0333  Weight: 68 kg 61 kg   Examination: General: Acute on chronically ill-appearing female, lying on the bed HEENT: [place]/AT, eyes anicteric.  moist mucus membranes Neuro: Alert, awake following commands Chest: Coarse breath sounds, no wheezes or rhonchi Heart: Regular rate and rhythm, no murmurs or gallops Abdomen: Soft, nontender, nondistended, bowel sounds present Skin: Stable erythema noted on right lower extremity mostly on the posterior aspect  Labs and images were reviewed  Resolved problems    Assessment & Plan:  Septic shock (HCC) due to acute cellulitis of the right leg.  This is her third admission for the same issue. Never seems to fully resolve per the family. Also report of a cat scratch, but seems to have happened well after symptoms started.  She has been offered laser ablation of the saphenous vein to reduce cellulitis risk by vascular surgery as an outpatient.  Came off of Levophed Continue midodrine to maintain MAP of 65 Appreciate infectious disease follow-up, recommend switching antibiotics to doxycycline and cefadroxil to complete 10 days therapy Closely monitor erythematous area  Acute hypoxic respiratory failure: Small bilateral pleural effusions  COPD without acute exacerbation Adenocarcinoma of right  lung, stage 4 (HCC) Widespread metastatic adenopathy to the ipsilateral hilum, bilateral mediastinum, bilateral neck, left axilla and left mesentery diagnosed in August 2023. S/p SBRT to the right upper lobe. Current therapy is carboplatin for AUC of 5, Alimta 500 Mg/M2 and  Keytruda 200 Mg IV every 3 weeks.  Status post 21 cycles. Last tx 11/20. Continue home Breztri As needed DuoNebs Aspiration precautions Supportive care Oncology follow-up as an outpatient  Chronic combined systolic (congestive) and diastolic (congestive) heart failure  Will try Lasix 60 mg once Monitor intake and output Not ready for GDMT because of borderline low blood pressure  Acute Kidney Injury  Serum creatinine continue to trend up Avoid nephrotoxic agent Trial of Lasix Monitor serum creatinine in the morning  Hypomagnesemia Continue aggressive electrolyte replacement  Pancytopenia Likely secondary to Keytruda  Repeat CBC because hemoglobin dropped from 11-7 All other counts remain low including thrombocytopenia and leukopenia, closely monitor  Best Practice (right click and "Reselect all SmartList Selections" daily)   Diet/type: Regular consistency (see orders) DVT prophylaxis: enoxaparin (LOVENOX) injection 30 mg Start: 03/19/23 1000   Pressure ulcer(s): not present on admission  GI prophylaxis: N/A Lines: N/A patient has Port-a-Cath Foley:  N/A Code Status:  full code Last date of multidisciplinary goals of care discussion [patient and husband were updated at the bedside     Cheri Fowler, MD [facility] See Amion for pager If no response to pager, please call [phone] until 7pm After 7pm, Please call E-link [phone]

## 2023-03-20 ENCOUNTER — Inpatient Hospital Stay (HOSPITAL_COMMUNITY): Payer: Medicare Other

## 2023-03-20 ENCOUNTER — Other Ambulatory Visit (HOSPITAL_COMMUNITY): Payer: Self-pay

## 2023-03-20 ENCOUNTER — Encounter: Payer: Self-pay | Admitting: Physician Assistant

## 2023-03-20 ENCOUNTER — Encounter: Payer: Self-pay | Admitting: Internal Medicine

## 2023-03-20 DIAGNOSIS — A419 Sepsis, unspecified organism: Secondary | ICD-10-CM | POA: Diagnosis not present

## 2023-03-20 DIAGNOSIS — R6521 Severe sepsis with septic shock: Secondary | ICD-10-CM | POA: Diagnosis not present

## 2023-03-20 LAB — CBC WITH DIFFERENTIAL/PLATELET
Abs Immature Granulocytes: 0.01 10*3/uL (ref 0.00–0.07)
Basophils Absolute: 0 10*3/uL (ref 0.0–0.1)
Basophils Relative: 1 %
Eosinophils Absolute: 0.3 10*3/uL (ref 0.0–0.5)
Eosinophils Relative: 14 %
HCT: 28.2 % — ABNORMAL LOW (ref 36.0–46.0)
Hemoglobin: 9.1 g/dL — ABNORMAL LOW (ref 12.0–15.0)
Immature Granulocytes: 1 %
Lymphocytes Relative: 44 %
Lymphs Abs: 0.9 10*3/uL (ref 0.7–4.0)
MCH: 35.7 pg — ABNORMAL HIGH (ref 26.0–34.0)
MCHC: 32.3 g/dL (ref 30.0–36.0)
MCV: 110.6 fL — ABNORMAL HIGH (ref 80.0–100.0)
Monocytes Absolute: 0.4 10*3/uL (ref 0.1–1.0)
Monocytes Relative: 18 %
Neutro Abs: 0.5 10*3/uL — ABNORMAL LOW (ref 1.7–7.7)
Neutrophils Relative %: 22 %
Platelets: 64 10*3/uL — ABNORMAL LOW (ref 150–400)
RBC: 2.55 MIL/uL — ABNORMAL LOW (ref 3.87–5.11)
RDW: 15 % (ref 11.5–15.5)
WBC: 2.1 10*3/uL — ABNORMAL LOW (ref 4.0–10.5)
nRBC: 0 % (ref 0.0–0.2)

## 2023-03-20 LAB — GLUCOSE, CAPILLARY
Glucose-Capillary: 108 mg/dL — ABNORMAL HIGH (ref 70–99)
Glucose-Capillary: 86 mg/dL (ref 70–99)
Glucose-Capillary: 115 mg/dL — ABNORMAL HIGH (ref 70–99)

## 2023-03-20 LAB — CULTURE, BLOOD (ROUTINE X 2)
Culture: NO GROWTH
Culture: NO GROWTH
Special Requests: ADEQUATE

## 2023-03-20 LAB — BASIC METABOLIC PANEL
Anion gap: 7 (ref 5–15)
BUN: 26 mg/dL — ABNORMAL HIGH (ref 8–23)
CO2: 23 mmol/L (ref 22–32)
Calcium: 9.9 mg/dL (ref 8.9–10.3)
Chloride: 108 mmol/L (ref 98–111)
Creatinine, Ser: 1.61 mg/dL — ABNORMAL HIGH (ref 0.44–1.00)
GFR, Estimated: 35 mL/min — ABNORMAL LOW (ref >60)
Glucose, Bld: 93 mg/dL (ref 70–99)
Potassium: 3.8 mmol/L (ref 3.5–5.1)
Sodium: 138 mmol/L (ref 135–145)

## 2023-03-20 LAB — PATHOLOGIST SMEAR REVIEW

## 2023-03-20 LAB — PROCALCITONIN: Procalcitonin: 4.68 ng/mL

## 2023-03-20 LAB — CORTISOL: Cortisol, Plasma: 5.8 ug/dL

## 2023-03-20 MED ORDER — SODIUM CHLORIDE 0.9% FLUSH: 10.0000 mL | INTRAVENOUS | Status: DC | PRN

## 2023-03-20 MED ORDER — AMOXICILLIN-POT CLAVULANATE 500-125 MG PO TABS
1.0000 | ORAL_TABLET | Freq: Two times a day (BID) | ORAL | 0 refills | Status: AC
  Filled 2023-03-20: qty 8, 4d supply, fill #0

## 2023-03-20 MED ORDER — MIDODRINE HCL 5 MG PO TABS: 5.0000 mg | ORAL_TABLET | Freq: Three times a day (TID) | ORAL | Status: DC

## 2023-03-20 MED ORDER — HEPARIN SOD (PORK) LOCK FLUSH 100 UNIT/ML IV SOLN
500.0000 [IU] | INTRAVENOUS | Status: AC | PRN
  Administered 2023-03-20: 500 [IU]

## 2023-03-20 MED ORDER — POLYETHYLENE GLYCOL 3350 17 G PO PACK
34.0000 g | PACK | Freq: Once | ORAL | Status: AC
  Administered 2023-03-20: 34 g via ORAL
  Filled 2023-03-20: qty 2

## 2023-03-20 MED ORDER — BISACODYL 5 MG PO TBEC: 10.0000 mg | DELAYED_RELEASE_TABLET | Freq: Every day | ORAL | Status: DC | PRN

## 2023-03-20 MED ORDER — DOXYCYCLINE HYCLATE 100 MG PO TABS
100.0000 mg | ORAL_TABLET | Freq: Two times a day (BID) | ORAL | 0 refills | Status: AC
  Filled 2023-03-20: qty 8, 4d supply, fill #0

## 2023-03-20 MED ORDER — BUTALBITAL-APAP-CAFFEINE 50-325-40 MG PO TABS
1.0000 | ORAL_TABLET | Freq: Four times a day (QID) | ORAL | Status: DC | PRN
  Administered 2023-03-20: 1 via ORAL
  Filled 2023-03-20: qty 1

## 2023-03-20 NOTE — Progress Notes (Signed)
PROGRESS NOTE    Yvette Curry  ZOX:[id number] DOB: 1956/01/11 DOA: 03/15/2023 PCP: [name], DO   Chief Complaint  Patient presents with   Fever    Brief Narrative:    67 year old woman who was transferred from [facility] for septic shock from suspected cellulitis.    She presented to [facility] on 11/24 with fever and lightheadedness for 2 days. Her right leg pain and erythema was worse than it had been earlier in the week. She was treated as septic shock related to recurrent cellulitis with fluids, antibiotics and vasopressors.    She is currently undergoing chemotherapy for stage IV lung cancer with latest round last week, followed by Dr Shirline Frees.  She was admitted to [facility] with right leg cellulitis in late October and completed antibiotic therapy on 11/1. The pain and swelling were much improved and she was able to start ambulating unassisted again. She may have sustained a small cat scratch on her right on 11/22.    On review of systems she reports some increased cough without a change in sputum production. Review is otherwise negative.    She was seen by Dr Myra Gianotti regarding her venous insufficiency on 11/18, and he had suggested laser vein ablation might help prevent further infection.   Significant Hospital Events: Including procedures, antibiotic start and stop dates in addition to other pertinent events   11/24 - admitted initially to the hospitalist service at [facility] 11/25 - transferred to [facility] once started on vasopressors.  11/27 -no acute events overnight, remains on low-dose vasopresso  Assessment & Plan:   Principal Problem:   Septic shock (HCC) Active Problems:   Cellulitis   Acute hypoxic respiratory failure (HCC)   Acute on chronic combined systolic (congestive) and diastolic (congestive) heart failure (HCC)   Adenocarcinoma of right lung, stage 4 (HCC)   Emphysema lung (HCC)   CAD (coronary artery disease)   Septic shock (HCC) due to acute cellulitis of the right  leg.  This is her third admission for the same issue. Never seems to fully resolve per the family. Also report of a cat scratch, but seems to have happened well after symptoms started.  She has been offered laser ablation of the saphenous vein to reduce cellulitis risk by vascular surgery as an outpatient.  Came off of Levophed Continue midodrine to maintain MAP of 65, BP on the higher side, so will decrease to 5 mg po TID with holding parameters Appreciate infectious disease follow-up, patient has been switched doxycycline and Augmentin  Closely monitor erythematous area   Acute hypoxic respiratory failure: Small bilateral pleural effusions, repeat x-ray this morning with improving effusions, she was encouraged use incentive spirometry and flutter valve  COPD without acute exacerbation Adenocarcinoma of right lung, stage 4 (HCC) Widespread metastatic adenopathy to the ipsilateral hilum, bilateral mediastinum, bilateral neck, left axilla and left mesentery diagnosed in August 2023. S/p SBRT to the right upper lobe. Current therapy is carboplatin for AUC of 5, Alimta 500 Mg/M2 and Keytruda 200 Mg IV every 3 weeks.  Status post 21 cycles. Last tx 11/20. Continue home Breztri As needed DuoNebs Aspiration precautions Supportive care Oncology follow-up as an outpatient   Chronic combined systolic (congestive) and diastolic (congestive) heart failure  Lasix and respiratory status improving with Lasix, will give another dose of Lasix today, but will monitor her blood pressure and renal function closely. Monitor intake and output Not ready for GDMT because of borderline low blood pressure   Acute Kidney  Injury  Serum creatinine continue to trend up Avoid nephrotoxic agent 10 remained stable, will give another dose of Lasix today.   Hypomagnesemia Continue aggressive electrolyte replacement   Pancytopenia Likely secondary to Keytruda  Repeat CBC because hemoglobin dropped from 11-7 All  other counts remain low including thrombocytopenia and leukopenia, closely monitor   DVT prophylaxis: Lovenox Code Status: Full code Family Communication: Discussed with husband at bedside Disposition:   Status is: Inpatient    Consultants:  PCCM Infectious disease Vascular surgery  Subjective:  Patient denies any chest pain or shortness of breath today, she does report some headache.  Objective: Vitals:   03/20/23 0030 03/20/23 0500 03/20/23 0802 03/20/23 1217  BP: 117/75 115/63 (!) 98/58 (!) 130/93  Pulse: 100 97 96 97  Resp: 18 17 20 17   Temp: 99 F (37.2 C) 98.9 F (37.2 C) 98.8 F (37.1 C) 98 F (36.7 C)  TempSrc: Oral Oral Oral Oral  SpO2: 93% 96% 93% 96%  Weight:      Height:        Intake/Output Summary (Last 24 hours) at 03/20/2023 1258 Last data filed at 03/20/2023 0200 Gross per 24 hour  Intake --  Output 1150 ml  Net -1150 ml   Filed Weights   03/16/23 0653 03/17/23 0333  Weight: 68 kg 61 kg    Examination:  Awake Alert, Oriented X 3, No new F.N deficits, Normal affect Symmetrical Chest wall movement, Good air movement bilaterally, CTAB RRR,No Gallops,Rubs or new Murmurs, No Parasternal Heave +ve B.Sounds, Abd Soft, No tenderness, No rebound - guarding or rigidity. No Cyanosis, Clubbing, patient with lower extremity erythema     Data Reviewed: I have personally reviewed following labs and imaging studies  CBC: Recent Labs  Lab 03/15/23 1843 03/16/23 0505 03/18/23 0435 03/19/23 0420 03/19/23 1038 03/20/23 0510  WBC 8.6 6.8 1.8* 1.6* 1.6* 2.1*  NEUTROABS 7.8*  --   --  0.8* 0.7* 0.5*  HGB 11.3* 9.4* 11.1* 7.8* 8.6* 9.1*  HCT 34.0* 28.8* 33.2* 23.6* 26.2* 28.2*  MCV 110.7* 112.1* 110.7* 111.3* 112.0* 110.6*  PLT 165 131* 68* 61* 63* 64*    Basic Metabolic Panel: Recent Labs  Lab 03/15/23 1843 03/16/23 0505 03/18/23 0435 03/19/23 0420 03/20/23 0510  NA 133* 134* 136 138 138  K 3.6 4.2 4.7 4.5 3.8  CL 100 102 108 110 108   CO2 25 25 21* 23 23  GLUCOSE 128* 140* 129* 113* 93  BUN 21 23 27* 24* 26*  CREATININE 1.12* 1.34* 1.60* 1.70* 1.61*  CALCIUM 9.5 8.9 9.0 9.5 9.9  MG  --   --  1.6*  --   --   PHOS  --   --  2.6  --   --     GFR: Estimated Creatinine Clearance: 28.8 mL/min (A) (by C-G formula based on SCr of 1.61 mg/dL (H)).  Liver Function Tests: Recent Labs  Lab 03/15/23 1843  AST 28  ALT 15  ALKPHOS 49  BILITOT 1.1  PROT 7.1  ALBUMIN 2.9*    CBG: Recent Labs  Lab 03/18/23 2333 03/19/23 1151 03/19/23 2212 03/20/23 0800 03/20/23 1214  GLUCAP 100* 104* 109* 86 115*     Recent Results (from the past 240 hour(s))  Culture, blood (Routine x 2)     Status: None   Collection Time: 03/15/23  6:43 PM   Specimen: Right Antecubital; Blood  Result Value Ref Range Status   Specimen Description RIGHT ANTECUBITAL  Final   Special  Requests   Final    BOTTLES DRAWN AEROBIC AND ANAEROBIC Blood Culture results may not be optimal due to an excessive volume of blood received in culture bottles   Culture   Final    NO GROWTH 5 DAYS Performed at [facility], [address], [place], [place] [address]    Report Status 03/20/2023 FINAL  Final  Culture, blood (Routine x 2)     Status: None   Collection Time: 03/15/23  6:55 PM   Specimen: Porta Cath; Blood  Result Value Ref Range Status   Specimen Description PORTA CATH  Final   Special Requests   Final    BOTTLES DRAWN AEROBIC AND ANAEROBIC Blood Culture adequate volume   Culture   Final    NO GROWTH 5 DAYS Performed at [facility], [address], [place], [place] [address]    Report Status 03/20/2023 FINAL  Final  Urine Culture (for pregnant, neutropenic or urologic patients or patients with an indwelling urinary catheter)     Status: Abnormal   Collection Time: 03/15/23  8:08 PM   Specimen: Urine, Clean Catch  Result Value Ref Range Status   Specimen Description   Final    URINE, CLEAN CATCH Performed at [facility], [address] [place], [place] [address]    Special Requests   Final    NONE Performed at [facility], [address], [place], [place] [address]    Culture MULTIPLE SPECIES PRESENT, SUGGEST RECOLLECTION (A)  Final   Report Status 03/17/2023 FINAL  Final  MRSA Next Gen by PCR, Nasal     Status: None   Collection Time: 03/15/23  8:29 PM   Specimen: Nasal Mucosa; Nasal Swab  Result Value Ref Range Status   MRSA by PCR Next Gen NOT DETECTED NOT DETECTED Final    Comment: (NOTE) The GeneXpert MRSA Assay (FDA approved for NASAL specimens only), is one component of a comprehensive MRSA colonization surveillance program. It is not intended to diagnose MRSA infection nor to guide or monitor treatment for MRSA infections. Test performance is not FDA approved in patients less than 24 years old. Performed at [facility], [address] [place], [place] [address]   Resp panel by RT-PCR (RSV, Flu A&B, Covid) Anterior Nasal Swab     Status: None   Collection Time: 03/15/23  9:30 PM   Specimen: Anterior Nasal Swab  Result Value Ref Range Status   SARS Coronavirus 2 by RT PCR NEGATIVE NEGATIVE Final    Comment: (NOTE) SARS-CoV-2 target nucleic acids are NOT DETECTED.  The SARS-CoV-2 RNA is generally detectable in upper respiratory specimens during the acute phase of infection. The lowest concentration of SARS-CoV-2 viral copies this assay can detect is 138 copies/mL. A negative result does not preclude SARS-Cov-2 infection and should not be used as the sole basis for treatment or other patient management decisions. A negative result may occur with  improper specimen collection/handling, submission of specimen other than nasopharyngeal swab, presence of viral mutation(s) within the areas targeted by this assay, and inadequate number of viral copies(<138 copies/mL). A negative result must be combined with clinical observations, patient history, and epidemiological information. The expected result is  Negative.  Fact Sheet for Patients:  BloggerCourse.com  Fact Sheet for Healthcare Providers:  SeriousBroker.it  This test is no t yet approved or cleared by the [place] FDA and  has been authorized for detection and/or diagnosis of SARS-CoV-2 by FDA under an Emergency Use Authorization (EUA). This EUA  will remain  in effect (meaning this test can be used) for the duration of the COVID-19 declaration under Section 564(b)(1) of the Act, 21 U.S.C.section 360bbb-3(b)(1), unless the authorization is terminated  or revoked sooner.       Influenza A by PCR NEGATIVE NEGATIVE Final   Influenza B by PCR NEGATIVE NEGATIVE Final    Comment: (NOTE) The Xpert Xpress SARS-CoV-2/FLU/RSV plus assay is intended as an aid in the diagnosis of influenza from Nasopharyngeal swab specimens and should not be used as a sole basis for treatment. Nasal washings and aspirates are unacceptable for Xpert Xpress SARS-CoV-2/FLU/RSV testing.  Fact Sheet for Patients: BloggerCourse.com  Fact Sheet for Healthcare Providers: SeriousBroker.it  This test is not yet approved or cleared by the [place] FDA and has been authorized for detection and/or diagnosis of SARS-CoV-2 by FDA under an Emergency Use Authorization (EUA). This EUA will remain in effect (meaning this test can be used) for the duration of the COVID-19 declaration under Section 564(b)(1) of the Act, 21 U.S.C. section 360bbb-3(b)(1), unless the authorization is terminated or revoked.     Resp Syncytial Virus by PCR NEGATIVE NEGATIVE Final    Comment: (NOTE) Fact Sheet for Patients: BloggerCourse.com  Fact Sheet for Healthcare Providers: SeriousBroker.it  This test is not yet approved or cleared by the [place] FDA and has been authorized for detection and/or diagnosis of  SARS-CoV-2 by FDA under an Emergency Use Authorization (EUA). This EUA will remain in effect (meaning this test can be used) for the duration of the COVID-19 declaration under Section 564(b)(1) of the Act, 21 U.S.C. section 360bbb-3(b)(1), unless the authorization is terminated or revoked.  Performed at [facility], [address] [place], [place] [address]          Radiology Studies: [facility] [address] View  Result Date: 03/20/2023 CLINICAL DATA:  Cough EXAM: PORTABLE CHEST 1 VIEW COMPARISON:  Chest radiograph dated 03/18/2023 FINDINGS: Lines/tubes: Right chest wall port tip projects over the SVC. Lungs: Well inflated lungs. Minimal bibasilar patchy opacities. Pleura: Decreased blunting of the bilateral costophrenic angles. No pneumothorax. Heart/mediastinum: The heart size and mediastinal contours are within normal limits. Bones: No acute osseous abnormality. IMPRESSION: 1. Minimal bibasilar patchy opacities, likely atelectasis. Aspiration or pneumonia can be considered in the appropriate clinical setting. 2. Decreased blunting of the bilateral costophrenic angles, which may represent decreased trace pleural effusions. Electronically Signed   By: Agustin Cree M.D.   On: 03/20/2023 12:34        Scheduled Meds:  amoxicillin-clavulanate  1 tablet Oral BID   ascorbic acid  500 mg Oral Daily   Budeson-Glycopyrrol-Formoterol  2 puff Inhalation BID   Chlorhexidine Gluconate Cloth  6 each Topical Daily   doxycycline  100 mg Oral Q12H   enoxaparin (LOVENOX) injection  30 mg Subcutaneous Q24H   feeding supplement  237 mL Oral TID BM   folic acid  1 mg Oral Daily   gabapentin  200 mg Oral BID   hydrocerin   Topical BID   midodrine  10 mg Oral TID WC   Continuous Infusions:   LOS: 5 days        Huey Bienenstock, MD [facility]   To contact the attending provider between 7A-7P or the covering provider during after hours 7P-7A, please log into the web site [email or web address] and  access using universal [place] password for that web site. If you do not have the password, please call the hospital operator.  03/20/2023, 12:58  PM

## 2023-03-20 NOTE — Evaluation (Signed)
Physical Therapy Brief Evaluation and Discharge Note Patient Details Name: Yvette Curry MRN: [id number] DOB: 04-Nov-1955 Today's Date: 03/20/2023   History of Present Illness  Yvette Curry is a 67 y.o. female with medical history significant for emphysema, stage IV lung cancer. Patient presented to the ED with complaints of dizziness, and poor appetite that started yesterday. Pt was then transferred from [facility] for septic shock from suspected cellulitis on 11/25.  Clinical Impression  Pt presents with admitting diagnosis above. Pt today was able to ambulate in hallway independently and navigate stairs. PTA pt was mostly independent with no AD however occasionally used RW after sustaining ankle sprain from fall earlier this month. Pt presents at or near baseline mobility. Pt has no further acute PT needs and will be signing off. Re consult PT if mobility status changes. Pt would like HHPT upon DC. Pt would benefit from continued mobility with mobility specialist during acute stay.      PT Assessment All further PT needs can be met in the next venue of care  Assistance Needed at Discharge  PRN    Equipment Recommendations None recommended by PT  Recommendations for Other Services       Precautions/Restrictions Precautions Precautions: Fall Restrictions Weight Bearing Restrictions: No        Mobility  Bed Mobility   Supine/Sidelying to sit: Independent Sit to supine/sidelying: Independent    Transfers Overall transfer level: Independent Equipment used: None                    Ambulation/Gait Ambulation/Gait assistance: Independent Gait Distance (Feet): 3503 Feet Assistive device: None Gait Pattern/deviations: WFL(Within Functional Limits), Drifts right/left Gait Speed: Pace WFL General Gait Details: no LOB noted. Occasionally drifted L/R  Home Activity Instructions    Stairs Stairs: Yes Stairs assistance: Supervision Stair Management: One rail Right,  One rail Left Number of Stairs: 5 General stair comments: no LOB noted  Modified Rankin (Stroke Patients Only)        Balance Overall balance assessment: No apparent balance deficits (not formally assessed)                        Pertinent Vitals/Pain PT - Brief Vital Signs All Vital Signs Stable: Yes Pain Assessment Pain Assessment: 0-10 Pain Score: 5  Pain Location: BLE Pain Descriptors / Indicators: Throbbing, Pounding, Constant, Discomfort Pain Intervention(s): Monitored during session     Home Living Family/patient expects to be discharged to:: Private residence Living Arrangements: Spouse/significant other Available Help at Discharge: Family;Available 24 hours/day Home Environment: Stairs to enter;Stairs in home  Stairs-Number of Steps: 2 Home Equipment: Rolling Walker (2 wheels);Shower seat;Wheelchair - manual;Crutches;Cane - single point        Prior Function Level of Independence: Independent      UE/LE Assessment   UE ROM/Strength/Tone/Coordination: WFL    LE ROM/Strength/Tone/Coordination: [facility]      Communication   Communication Communication: No apparent difficulties     Cognition Overall Cognitive Status: Appears within functional limits for tasks assessed/performed       General Comments General comments (skin integrity, edema, etc.): BP: 130/74 supine, BP: 124/88 seated, BP: 124/79 standing    Exercises     Assessment/Plan    PT Problem List         PT Visit Diagnosis Other abnormalities of gait and mobility (R26.89)    No Skilled PT Patient at baseline level of functioning;Patient is independent with all acitivity/mobility   Co-evaluation  AMPAC 6 Clicks Help needed turning from your back to your side while in a flat bed without using bedrails?: None Help needed moving from lying on your back to sitting on the side of a flat bed without using bedrails?: None Help needed moving to and from a bed to  a chair (including a wheelchair)?: None Help needed standing up from a chair using your arms (e.g., wheelchair or bedside chair)?: None Help needed to walk in hospital room?: None Help needed climbing 3-5 steps with a railing? : A Little 6 Click Score: 23      End of Session Equipment Utilized During Treatment: Gait belt Activity Tolerance: Patient tolerated treatment well Patient left: Other (comment) (Handoff to mobility) Nurse Communication: Mobility status PT Visit Diagnosis: Other abnormalities of gait and mobility (R26.89)     Time: 9629-5284 PT Time Calculation (min) (ACUTE ONLY): 33 min  Charges:   PT Evaluation $PT Eval Moderate Complexity: 1 Mod PT Treatments $Gait Training: 8-22 mins    Shela Nevin, PT, DPT Acute Rehab Services [phone]   Gladys Damme  03/20/2023, 1:04 PM

## 2023-03-20 NOTE — Discharge Instructions (Signed)
Follow with Primary MD [name], DO in 7 days   Get CBC, CMP, 2 view Chest X ray checked  by Primary MD next visit.    Activity: As tolerated with Full fall precautions use walker/cane & assistance as needed   Disposition Home    Diet: Regular diet   On your next visit with your primary care physician please Get Medicines reviewed and adjusted.   Please request your Prim.MD to go over all Hospital Tests and Procedure/Radiological results at the follow up, please get all Hospital records sent to your Prim MD by signing hospital release before you go home.   If you experience worsening of your admission symptoms, develop shortness of breath, life threatening emergency, suicidal or homicidal thoughts you must seek medical attention immediately by calling 911 or calling your MD immediately  if symptoms less severe.  You Must read complete instructions/literature along with all the possible adverse reactions/side effects for all the Medicines you take and that have been prescribed to you. Take any new Medicines after you have completely understood and accpet all the possible adverse reactions/side effects.   Do not drive, operating heavy machinery, perform activities at heights, swimming or participation in water activities or provide baby sitting services if your were admitted for syncope or siezures until you have seen by Primary MD or a Neurologist and advised to do so again.  Do not drive when taking Pain medications.    Do not take more than prescribed Pain, Sleep and Anxiety Medications  Special Instructions: If you have smoked or chewed Tobacco  in the last 2 yrs please stop smoking, stop any regular Alcohol  and or any Recreational drug use.  Wear Seat belts while driving.   Please note  You were cared for by a hospitalist during your hospital stay. If you have any questions about your discharge medications or the care you received while you were in the hospital after  you are discharged, you can call the unit and asked to speak with the hospitalist on call if the hospitalist that took care of you is not available. Once you are discharged, your primary care physician will handle any further medical issues. Please note that NO REFILLS for any discharge medications will be authorized once you are discharged, as it is imperative that you return to your primary care physician (or establish a relationship with a primary care physician if you do not have one) for your aftercare needs so that they can reassess your need for medications and monitor your lab values.

## 2023-03-20 NOTE — Discharge Summary (Signed)
Physician Discharge Summary  Yvette Curry YQI:[id number] DOB: 05-Mar-1956 DOA: 03/15/2023  PCP: [name], DO  Admit date: 03/15/2023 Discharge date: 03/20/2023  Admitted From: (Home) Disposition:  (Home )  Recommendations for Outpatient Follow-up:  Follow up with PCP in 1-2 weeks Please obtain BMP/CBC in one week    Diet recommendation: Regular  Brief/Interim Summary:   67 year old woman who was transferred from [facility] for septic shock from suspected cellulitis.    She presented to [facility] on 11/24 with fever and lightheadedness for 2 days. Her right leg pain and erythema was worse than it had been earlier in the week. She was treated as septic shock related to recurrent cellulitis with fluids, antibiotics and vasopressors.    She is currently undergoing chemotherapy for stage IV lung cancer with latest round last week, followed by Dr Shirline Frees.  She was admitted to [facility] with right leg cellulitis in late October and completed antibiotic therapy on 11/1. The pain and swelling were much improved and she was able to start ambulating unassisted again. She may have sustained a small cat scratch on her right on 11/22.    On review of systems she reports some increased cough without a change in sputum production. Review is otherwise negative.    She was seen by Dr Myra Gianotti regarding her venous insufficiency on 11/18, and he had suggested laser vein ablation might help prevent further infection.    Significant Hospital Events: Including procedures, antibiotic start and stop dates in addition to other pertinent events   11/24 - admitted initially to the hospitalist service at [facility] 11/25 - transferred to [facility] once started on vasopressors.  11/27 -no acute events overnight, remains on low-dose vasopresso 11/29 blood pressure has been stable, breathing in the hallway with no hypertension, stable for discharge    Septic shock (HCC) due to acute cellulitis of the right leg.  This is her third  admission for the same issue. Never seems to fully resolve per the family. Also report of a cat scratch, but seems to have happened well after symptoms started.  She has been offered laser ablation of the saphenous vein to reduce cellulitis risk by vascular surgery as an outpatient.  Came off of Levophed Continue midodrine to maintain MAP of 65, BP on the higher side, so will decrease to 5 mg po TID with holding parameters Appreciate infectious disease follow-up, patient has been switched doxycycline and Augmentin, she will be discharged for total 10 days treatment. Closely monitor erythematous area   Acute hypoxic respiratory failure: Small bilateral pleural effusions, repeat x-ray this morning with improving effusions, she was encouraged use incentive spirometry and flutter valve -He is with no further hypoxia or oxygen requirement today.   COPD without acute exacerbation Adenocarcinoma of right lung, stage 4 (HCC) Widespread metastatic adenopathy to the ipsilateral hilum, bilateral mediastinum, bilateral neck, left axilla and left mesentery diagnosed in August 2023. S/p SBRT to the right upper lobe. Current therapy is carboplatin for AUC of 5, Alimta 500 Mg/M2 and Keytruda 200 Mg IV every 3 weeks.  Status post 21 cycles. Last tx 11/20. Continue home Breztri As needed DuoNebs Aspiration precautions Supportive care Oncology follow-up as an outpatient   Chronic combined systolic (congestive) and diastolic (congestive) heart failure y. No evidence of volume overload currently, so no need for diuresis on discharge Not ready for GDMT because of borderline low blood pressure   Acute Kidney Injury  Serum creatinine continue to trend up Avoid nephrotoxic agent Fattening  stable at 1.6 during hospital stay, avoid nephrotoxic medications, repeat CBC as an outpatient   Hypomagnesemia Replaced   Pancytopenia Likely secondary to [facility]  Repeat CBC because hemoglobin dropped from 11-7 All  other counts remain low including thrombocytopenia and leukopenia, closely monitor      Discharge Diagnoses:  Principal Problem:   Septic shock (HCC) Active Problems:   Cellulitis   Acute hypoxic respiratory failure (HCC)   Acute on chronic combined systolic (congestive) and diastolic (congestive) heart failure (HCC)   Adenocarcinoma of right lung, stage 4 (HCC)   Emphysema lung (HCC)   CAD (coronary artery disease)    Discharge Instructions  Discharge Instructions     Diet - low sodium heart healthy   Complete by: As directed    Discharge instructions   Complete by: As directed    Follow with Primary MD [name], DO in 7 days   Get CBC, CMP, 2 view Chest X ray checked  by Primary MD next visit.    Activity: As tolerated with Full fall precautions use walker/cane & assistance as needed   Disposition Home    Diet: Regular diet   On your next visit with your primary care physician please Get Medicines reviewed and adjusted.   Please request your Prim.MD to go over all Hospital Tests and Procedure/Radiological results at the follow up, please get all Hospital records sent to your Prim MD by signing hospital release before you go home.   If you experience worsening of your admission symptoms, develop shortness of breath, life threatening emergency, suicidal or homicidal thoughts you must seek medical attention immediately by calling 911 or calling your MD immediately  if symptoms less severe.  You Must read complete instructions/literature along with all the possible adverse reactions/side effects for all the Medicines you take and that have been prescribed to you. Take any new Medicines after you have completely understood and accpet all the possible adverse reactions/side effects.   Do not drive, operating heavy machinery, perform activities at heights, swimming or participation in water activities or provide baby sitting services if your were admitted for syncope  or siezures until you have seen by Primary MD or a Neurologist and advised to do so again.  Do not drive when taking Pain medications.    Do not take more than prescribed Pain, Sleep and Anxiety Medications  Special Instructions: If you have smoked or chewed Tobacco  in the last 2 yrs please stop smoking, stop any regular Alcohol  and or any Recreational drug use.  Wear Seat belts while driving.   Please note  You were cared for by a hospitalist during your hospital stay. If you have any questions about your discharge medications or the care you received while you were in the hospital after you are discharged, you can call the unit and asked to speak with the hospitalist on call if the hospitalist that took care of you is not available. Once you are discharged, your primary care physician will handle any further medical issues. Please note that NO REFILLS for any discharge medications will be authorized once you are discharged, as it is imperative that you return to your primary care physician (or establish a relationship with a primary care physician if you do not have one) for your aftercare needs so that they can reassess your need for medications and monitor your lab values.   Increase activity slowly   Complete by: As directed       Allergies  as of 03/20/2023       Reactions   Lidocaine Shortness Of Breath, Anxiety   Patient felt like she "couldn't breathe", panicky Allergic to all " caines"   Mepivacaine Swelling   angioedema   Demerol Nausea And Vomiting   Prednisone Hives, Nausea And Vomiting   abd pain and vomiting, Hives    Sulfa Antibiotics Hives   Hives, swelling and itching        Medication List     TAKE these medications    acetaminophen 500 MG tablet Commonly known as: TYLENOL Take 500 mg by mouth as needed for mild pain (pain score 1-3), moderate pain (pain score 4-6) or fever. What changed: Another medication with the same name was changed. Make sure you  understand how and when to take each.   acetaminophen 325 MG tablet Commonly known as: TYLENOL Take 2 tablets (650 mg total) by mouth every 6 (six) hours as needed for mild pain (pain score 1-3) (or Fever >/= 101). What changed:  how much to take reasons to take this   amoxicillin-clavulanate 500-125 MG tablet Commonly known as: AUGMENTIN Take 1 tablet by mouth 2 (two) times daily for 4 days.   ascorbic acid 500 MG tablet Commonly known as: VITAMIN C Take 1,000 mg by mouth daily.   Breztri Aerosphere 160-9-4.8 MCG/ACT Aero Generic drug: Budeson-Glycopyrrol-Formoterol Inhale 2 puffs into the lungs in the morning and at bedtime. What changed: when to take this   carboxymethylcellulose 1 % ophthalmic solution Apply 1-2 drops to eye as needed (dry eye).   doxycycline 100 MG tablet Commonly known as: VIBRA-TABS Take 1 tablet (100 mg total) by mouth every 12 (twelve) hours for 4 days.   ferrous sulfate 324 MG Tbec Take 1 tablet (324 mg total) by mouth daily with breakfast.   folic acid 1 MG tablet Commonly known as: FOLVITE Take 1 tablet (1 mg total) by mouth daily.   gabapentin 100 MG capsule Commonly known as: NEURONTIN Take 2 capsules (200 mg total) by mouth 2 (two) times daily. What changed: how much to take   lidocaine 4 % cream Commonly known as: LMX Apply topically 3 (three) times daily as needed (leg pain). What changed:  how much to take when to take this   midodrine 5 MG tablet Commonly known as: PROAMATINE Take 1 tablet (5 mg total) by mouth 3 (three) times daily with meals. What changed:  when to take this reasons to take this   Omron 3 Series BP Monitor Devi Use as directed   oxyCODONE 5 MG immediate release tablet Commonly known as: Oxy IR/ROXICODONE Take 0.5-1 tablets (2.5-5 mg total) by mouth every 4 (four) hours as needed for severe pain (pain score 7-10) or moderate pain (pain score 4-6) (2.5 mg for moderate pain and 5 mg for severe pain).    pantoprazole 40 MG tablet Commonly known as: PROTONIX Take 40 mg by mouth daily.   PROBIOTIC PO Take 1 capsule by mouth daily.   prochlorperazine 10 MG tablet Commonly known as: COMPAZINE Take 10 mg by mouth as needed for nausea or vomiting. Every 3 weeks before chemo        Allergies  Allergen Reactions   Lidocaine Shortness Of Breath and Anxiety    Patient felt like she "couldn't breathe", panicky Allergic to all " caines"   Mepivacaine Swelling    angioedema   Demerol Nausea And Vomiting   Prednisone Hives and Nausea And Vomiting    abd pain and  vomiting, Hives    Sulfa Antibiotics Hives    Hives, swelling and itching    Consultations: Infectious disease Vascular surgery PCCM   Procedures/Studies: DG Chest Port 1 View  Result Date: 03/20/2023 CLINICAL DATA:  Cough EXAM: PORTABLE CHEST 1 VIEW COMPARISON:  Chest radiograph dated 03/18/2023 FINDINGS: Lines/tubes: Right chest wall port tip projects over the SVC. Lungs: Well inflated lungs. Minimal bibasilar patchy opacities. Pleura: Decreased blunting of the bilateral costophrenic angles. No pneumothorax. Heart/mediastinum: The heart size and mediastinal contours are within normal limits. Bones: No acute osseous abnormality. IMPRESSION: 1. Minimal bibasilar patchy opacities, likely atelectasis. Aspiration or pneumonia can be considered in the appropriate clinical setting. 2. Decreased blunting of the bilateral costophrenic angles, which may represent decreased trace pleural effusions. Electronically Signed   By: Agustin Cree M.D.   On: 03/20/2023 12:34   DG CHEST PORT 1 VIEW  Result Date: 03/18/2023 CLINICAL DATA:  67 year old female with history of fever and hypoxia. EXAM: PORTABLE CHEST 1 VIEW COMPARISON:  Chest x-ray 03/15/2023. FINDINGS: Right internal jugular single-lumen Port-A-Cath with tip terminating in the distal superior vena cava. Lung volumes are normal. Bibasilar opacities which may reflect areas of atelectasis  and/or consolidation with superimposed small bilateral pleural effusions. No pneumothorax. No evidence of pulmonary edema. Heart size is normal. Upper mediastinal contours are within normal limits. Atherosclerotic calcifications are noted in the thoracic aorta. IMPRESSION: 1. Similar appearance of the chest with bibasilar areas of atelectasis and/or consolidation and small bilateral pleural effusions. 2. Aortic atherosclerosis. Electronically Signed   By: Trudie Reed M.D.   On: 03/18/2023 06:22   DG Chest 2 View  Result Date: 03/15/2023 CLINICAL DATA:  Suspected sepsis.  History of lung cancer. EXAM: CHEST - 2 VIEW COMPARISON:  Chest radiograph dated 02/12/2023. FINDINGS: The heart size and mediastinal contours are within normal limits. A right internal jugular central venous port catheter tip overlies the superior vena cava. There are small bilateral pleural effusions with associated atelectasis. A right apical pulmonary nodule is better seen on same day chest CT. No pneumothorax. Degenerative changes are seen in the spine. IMPRESSION: Small bilateral pleural effusions with associated atelectasis. Electronically Signed   By: Romona Curls M.D.   On: 03/15/2023 20:17   CT CHEST ABDOMEN PELVIS W CONTRAST  Result Date: 03/15/2023 CLINICAL DATA:  Sepsis, lung cancer, on chemotherapy * Tracking Code: BO * EXAM: CT CHEST, ABDOMEN, AND PELVIS WITH CONTRAST TECHNIQUE: Multidetector CT imaging of the chest, abdomen and pelvis was performed following the standard protocol during bolus administration of intravenous contrast. RADIATION DOSE REDUCTION: This exam was performed according to the departmental dose-optimization program which includes automated exposure control, adjustment of the mA and/or kV according to patient size and/or use of iterative reconstruction technique. CONTRAST:  OMNIPAQUE IOHEXOL 300 MG/ML  SOLN COMPARISON:  CT chest, 01/21/2023, CT abdomen pelvis, 12/10/2022 FINDINGS: CT CHEST  FINDINGS Cardiovascular: Right chest port catheter. Aortic atherosclerosis. Normal heart size. Left coronary artery calcifications. Unchanged small pericardial effusion. Mediastinum/Nodes: Unchanged enlarged left axillary lymph nodes measuring up to 1.0 x 0.9 cm (series 2, image 16). No other enlarged mediastinal, hilar, or axillary lymph nodes. Thyroid gland, trachea, and esophagus demonstrate no significant findings. Lungs/Pleura: Small, right-greater-than-left pleural effusions, increased compared to prior examination. Unchanged spiculated nodule of the right pulmonary apex measuring 1.3 x 0.9 cm (series 3, image 39). Mild underlying centrilobular emphysema. New interlobular septal thickening at the lung bases. Musculoskeletal: No chest wall abnormality. No acute osseous findings. CT ABDOMEN PELVIS  FINDINGS Hepatobiliary: No solid liver abnormality is seen. No gallstones, gallbladder wall thickening, or biliary dilatation. Pancreas: Unremarkable. No pancreatic ductal dilatation or surrounding inflammatory changes. Spleen: Normal in size without significant abnormality. Adrenals/Urinary Tract: Adrenal glands are unremarkable. Kidneys are normal, without renal calculi, solid lesion, or hydronephrosis. Bladder is unremarkable. Stomach/Bowel: Stomach is within normal limits. Appendix appears normal. No evidence of bowel wall thickening, distention, or inflammatory changes. Sigmoid diverticulosis. Vascular/Lymphatic: Severe aortic atherosclerosis. No enlarged abdominal or pelvic lymph nodes. Reproductive: No mass or other abnormality. Other: No abdominal wall hernia or abnormality. No ascites. Musculoskeletal: No acute osseous findings. IMPRESSION: 1. Unchanged spiculated nodule of the right pulmonary apex measuring 1.3 x 0.9 cm, consistent with known primary lung malignancy. 2. Unchanged enlarged left axillary lymph nodes. 3. No evidence of lymphadenopathy or metastatic disease in the abdomen or pelvis. 4. Small,  right-greater-than-left pleural effusions, increased compared to prior examination. New interlobular septal thickening at the lung bases, consistent with pulmonary edema. 5. Emphysema. 6. Coronary artery disease. Aortic Atherosclerosis (ICD10-I70.0) and Emphysema (ICD10-J43.9). Electronically Signed   By: Jearld Lesch M.D.   On: 03/15/2023 20:12   VAS [place] LOWER EXTREMITY VENOUS (DVT)  Result Date: 02/25/2023  Lower Venous DVT Study Patient Name:  ARDEN BEEL  Date of Exam:   02/24/2023 Medical Rec #: [id number]        Accession #:    [id number] Date of Birth: December 01, 1955        Patient Gender: F Patient Age:   53 years Exam Location:  [facility] Procedure:      VAS [place] LOWER EXTREMITY VENOUS (DVT) Referring Phys: DANIEL CAFFREY --------------------------------------------------------------------------------  Indications: Increase lower leg swelling and pain, right worse than left, with h/o cellulities in the right lower leg. Patient has a h/o adenocarcinoma of the right lung, stage 4. Recent sprain to left foot on yesterday. She denies any unusual SOB.  Risk Factors: Cancer to the right lung; adenocarcinoma of the right lung Trauma to the left foot on 02/23/2023. Performing Technologist: Tyna Jaksch RVT  Examination Guidelines: A complete evaluation includes B-mode imaging, spectral Doppler, color Doppler, and power Doppler as needed of all accessible portions of each vessel. Bilateral testing is considered an integral part of a complete examination. Limited examinations for reoccurring indications may be performed as noted. The reflux portion of the exam is performed with the patient in reverse Trendelenburg.  +---------+---------------+---------+-----------+----------+--------------+ RIGHT    CompressibilityPhasicitySpontaneityPropertiesThrombus Aging +---------+---------------+---------+-----------+----------+--------------+ CFV      Full           Yes      Yes                                  +---------+---------------+---------+-----------+----------+--------------+ SFJ      Full           Yes      Yes                                 +---------+---------------+---------+-----------+----------+--------------+ FV Prox  Full           Yes      Yes                                 +---------+---------------+---------+-----------+----------+--------------+ FV Mid   Full                                                        +---------+---------------+---------+-----------+----------+--------------+  FV DistalFull           Yes      Yes                                 +---------+---------------+---------+-----------+----------+--------------+ PFV      Full                    Yes                                 +---------+---------------+---------+-----------+----------+--------------+ POP      Full           Yes      Yes                                 +---------+---------------+---------+-----------+----------+--------------+ Gastroc  Full                                                        +---------+---------------+---------+-----------+----------+--------------+ GSV      Full           Yes      Yes                                 +---------+---------------+---------+-----------+----------+--------------+ TPT      Full           Yes      Yes                                 +---------+---------------+---------+-----------+----------+--------------+   Right Technical Findings: Limited visualization of the PTVs and peroneal veins for compression due to body habitus and increase lower leg swelling; however, there is evidence of flow with distal augmentation. Specifically, the popliteal vein and tibioperoneal confluence are without thrombus.  +---------+---------------+---------+-----------+----------+--------------+ LEFT     CompressibilityPhasicitySpontaneityPropertiesThrombus Aging  +---------+---------------+---------+-----------+----------+--------------+ CFV      Full           Yes      Yes                                 +---------+---------------+---------+-----------+----------+--------------+ SFJ      Full           Yes      Yes                                 +---------+---------------+---------+-----------+----------+--------------+ FV Prox  Full           Yes      Yes                                 +---------+---------------+---------+-----------+----------+--------------+ FV Mid   Full                                                        +---------+---------------+---------+-----------+----------+--------------+  FV DistalFull           Yes      Yes                                 +---------+---------------+---------+-----------+----------+--------------+ PFV      Full                    Yes                                 +---------+---------------+---------+-----------+----------+--------------+ POP      Full           Yes      Yes                                 +---------+---------------+---------+-----------+----------+--------------+ Gastroc  Full                                                        +---------+---------------+---------+-----------+----------+--------------+ GSV      Full           Yes      Yes                                 +---------+---------------+---------+-----------+----------+--------------+ TPT      Full           Yes      Yes                                 +---------+---------------+---------+-----------+----------+--------------+   Left Technical Findings: Limited visualization of the PTVs and peroneal veins for compression due to body habitus and increase lower leg swelling; however, there is evidence of flow with distal augmentation. Specifically, the popliteal vein and tibioperoneal confluence are without thrombus.   Summary: BILATERAL: - No evidence of deep vein thrombosis  seen in the lower extremities, bilaterally. - No evidence of superficial venous thrombosis in the lower extremities, bilaterally. -No evidence of popliteal cyst, bilaterally.   *See table(s) above for measurements and observations. Electronically signed by Coral Else MD on 02/25/2023 at 9:30:53 PM.    Final    (Echo, Carotid, EGD, Colonoscopy, ERCP)    Subjective:  Patient denies any chest pain or shortness of breath today, she does report some headache.   Discharge Exam: Vitals:   03/20/23 0802 03/20/23 1217  BP: (!) 98/58 (!) 130/93  Pulse: 96 97  Resp: 20 17  Temp: 98.8 F (37.1 C) 98 F (36.7 C)  SpO2: 93% 96%   Vitals:   03/20/23 0030 03/20/23 0500 03/20/23 0802 03/20/23 1217  BP: 117/75 115/63 (!) 98/58 (!) 130/93  Pulse: 100 97 96 97  Resp: 18 17 20 17   Temp: 99 F (37.2 C) 98.9 F (37.2 C) 98.8 F (37.1 C) 98 F (36.7 C)  TempSrc: Oral Oral Oral Oral  SpO2: 93% 96% 93% 96%  Weight:      Height:         Awake Alert, Oriented X 3, No new F.N deficits,  Normal affect Symmetrical Chest wall movement, Good air movement bilaterally, CTAB RRR,No Gallops,Rubs or new Murmurs, No Parasternal Heave +ve B.Sounds, Abd Soft, No tenderness, No rebound - guarding or rigidity. No Cyanosis, Clubbing, patient with lower extremity erythema     The results of significant diagnostics from this hospitalization (including imaging, microbiology, ancillary and laboratory) are listed below for reference.     Microbiology: Recent Results (from the past 240 hour(s))  Culture, blood (Routine x 2)     Status: None   Collection Time: 03/15/23  6:43 PM   Specimen: Right Antecubital; Blood  Result Value Ref Range Status   Specimen Description RIGHT ANTECUBITAL  Final   Special Requests   Final    BOTTLES DRAWN AEROBIC AND ANAEROBIC Blood Culture results may not be optimal due to an excessive volume of blood received in culture bottles   Culture   Final    NO GROWTH 5 DAYS Performed  at [facility], [address], [place], [place] [address]    Report Status 03/20/2023 FINAL  Final  Culture, blood (Routine x 2)     Status: None   Collection Time: 03/15/23  6:55 PM   Specimen: Porta Cath; Blood  Result Value Ref Range Status   Specimen Description PORTA CATH  Final   Special Requests   Final    BOTTLES DRAWN AEROBIC AND ANAEROBIC Blood Culture adequate volume   Culture   Final    NO GROWTH 5 DAYS Performed at [facility] [address] [place], [place] [address]    Report Status 03/20/2023 FINAL  Final  Urine Culture (for pregnant, neutropenic or urologic patients or patients with an indwelling urinary catheter)     Status: Abnormal   Collection Time: 03/15/23  8:08 PM   Specimen: Urine, Clean Catch  Result Value Ref Range Status   Specimen Description   Final    URINE, CLEAN CATCH Performed at [facility], [address], [place], [place] [address]    Special Requests   Final    NONE Performed at [facility], [address] [place], [place] [address]    Culture MULTIPLE SPECIES PRESENT, SUGGEST RECOLLECTION (A)  Final   Report Status 03/17/2023 FINAL  Final  MRSA Next Gen by PCR, Nasal     Status: None   Collection Time: 03/15/23  8:29 PM   Specimen: Nasal Mucosa; Nasal Swab  Result Value Ref Range Status   MRSA by PCR Next Gen NOT DETECTED NOT DETECTED Final    Comment: (NOTE) The GeneXpert MRSA Assay (FDA approved for NASAL specimens only), is one component of a comprehensive MRSA colonization surveillance program. It is not intended to diagnose MRSA infection nor to guide or monitor treatment for MRSA infections. Test performance is not FDA approved in patients less than 27 years old. Performed at [facility], [address] [place], [place] [address]   Resp panel by RT-PCR (RSV, Flu A&B, Covid) Anterior Nasal Swab     Status: None   Collection Time: 03/15/23  9:30 PM   Specimen: Anterior Nasal Swab  Result Value Ref Range Status   SARS  Coronavirus 2 by RT PCR NEGATIVE NEGATIVE Final    Comment: (NOTE) SARS-CoV-2 target nucleic acids are NOT DETECTED.  The SARS-CoV-2 RNA is generally detectable in upper respiratory specimens during the acute phase of infection. The lowest concentration of SARS-CoV-2 viral copies this assay can detect is 138 copies/mL. A negative result does not preclude SARS-Cov-2 infection and should not be  used as the sole basis for treatment or other patient management decisions. A negative result may occur with  improper specimen collection/handling, submission of specimen other than nasopharyngeal swab, presence of viral mutation(s) within the areas targeted by this assay, and inadequate number of viral copies(<138 copies/mL). A negative result must be combined with clinical observations, patient history, and epidemiological information. The expected result is Negative.  Fact Sheet for Patients:  BloggerCourse.com  Fact Sheet for Healthcare Providers:  SeriousBroker.it  This test is no t yet approved or cleared by the [place] FDA and  has been authorized for detection and/or diagnosis of SARS-CoV-2 by FDA under an Emergency Use Authorization (EUA). This EUA will remain  in effect (meaning this test can be used) for the duration of the COVID-19 declaration under Section 564(b)(1) of the Act, 21 U.S.C.section 360bbb-3(b)(1), unless the authorization is terminated  or revoked sooner.       Influenza A by PCR NEGATIVE NEGATIVE Final   Influenza B by PCR NEGATIVE NEGATIVE Final    Comment: (NOTE) The Xpert Xpress SARS-CoV-2/FLU/RSV plus assay is intended as an aid in the diagnosis of influenza from Nasopharyngeal swab specimens and should not be used as a sole basis for treatment. Nasal washings and aspirates are unacceptable for Xpert Xpress SARS-CoV-2/FLU/RSV testing.  Fact Sheet for  Patients: BloggerCourse.com  Fact Sheet for Healthcare Providers: SeriousBroker.it  This test is not yet approved or cleared by the [place] FDA and has been authorized for detection and/or diagnosis of SARS-CoV-2 by FDA under an Emergency Use Authorization (EUA). This EUA will remain in effect (meaning this test can be used) for the duration of the COVID-19 declaration under Section 564(b)(1) of the Act, 21 U.S.C. section 360bbb-3(b)(1), unless the authorization is terminated or revoked.     Resp Syncytial Virus by PCR NEGATIVE NEGATIVE Final    Comment: (NOTE) Fact Sheet for Patients: BloggerCourse.com  Fact Sheet for Healthcare Providers: SeriousBroker.it  This test is not yet approved or cleared by the [place] FDA and has been authorized for detection and/or diagnosis of SARS-CoV-2 by FDA under an Emergency Use Authorization (EUA). This EUA will remain in effect (meaning this test can be used) for the duration of the COVID-19 declaration under Section 564(b)(1) of the Act, 21 U.S.C. section 360bbb-3(b)(1), unless the authorization is terminated or revoked.  Performed at [facility], [address], [place], [place] [address]      Labs: BNP (last 3 results) Recent Labs    10/20/22 2059 03/15/23 1843  BNP 10.0 25.0   Basic Metabolic Panel: Recent Labs  Lab 03/15/23 1843 03/16/23 0505 03/18/23 0435 03/19/23 0420 03/20/23 0510  NA 133* 134* 136 138 138  K 3.6 4.2 4.7 4.5 3.8  CL 100 102 108 110 108  CO2 25 25 21* 23 23  GLUCOSE 128* 140* 129* 113* 93  BUN 21 23 27* 24* 26*  CREATININE 1.12* 1.34* 1.60* 1.70* 1.61*  CALCIUM 9.5 8.9 9.0 9.5 9.9  MG  --   --  1.6*  --   --   PHOS  --   --  2.6  --   --    Liver Function Tests: Recent Labs  Lab 03/15/23 1843  AST 28  ALT 15  ALKPHOS 49  BILITOT 1.1  PROT 7.1  ALBUMIN 2.9*   No results for  input(s): "LIPASE", "AMYLASE" in the last 168 hours. No results for input(s): "AMMONIA" in the last 168 hours. CBC: Recent Labs  Lab 03/15/23  1843 03/16/23 0505 03/18/23 0435 03/19/23 0420 03/19/23 1038 03/20/23 0510  WBC 8.6 6.8 1.8* 1.6* 1.6* 2.1*  NEUTROABS 7.8*  --   --  0.8* 0.7* 0.5*  HGB 11.3* 9.4* 11.1* 7.8* 8.6* 9.1*  HCT 34.0* 28.8* 33.2* 23.6* 26.2* 28.2*  MCV 110.7* 112.1* 110.7* 111.3* 112.0* 110.6*  PLT 165 131* 68* 61* 63* 64*   Cardiac Enzymes: No results for input(s): "CKTOTAL", "CKMB", "CKMBINDEX", "TROPONINI" in the last 168 hours. BNP: Invalid input(s): "POCBNP" CBG: Recent Labs  Lab 03/19/23 0722 03/19/23 1151 03/19/23 2212 03/20/23 0800 03/20/23 1214  GLUCAP 108* 104* 109* 86 115*   D-Dimer No results for input(s): "DDIMER" in the last 72 hours. Hgb A1c No results for input(s): "HGBA1C" in the last 72 hours. Lipid Profile No results for input(s): "CHOL", "HDL", "LDLCALC", "TRIG", "CHOLHDL", "LDLDIRECT" in the last 72 hours. Thyroid function studies No results for input(s): "TSH", "T4TOTAL", "T3FREE", "THYROIDAB" in the last 72 hours.  Invalid input(s): "FREET3" Anemia work up No results for input(s): "VITAMINB12", "FOLATE", "FERRITIN", "TIBC", "IRON", "RETICCTPCT" in the last 72 hours. Urinalysis    Component Value Date/Time   COLORURINE YELLOW 03/15/2023 2000   APPEARANCEUR HAZY (A) 03/15/2023 2000   LABSPEC 1.033 (H) 03/15/2023 2000   PHURINE 6.0 03/15/2023 2000   GLUCOSEU NEGATIVE 03/15/2023 2000   HGBUR NEGATIVE 03/15/2023 2000   BILIRUBINUR NEGATIVE 03/15/2023 2000   KETONESUR NEGATIVE 03/15/2023 2000   PROTEINUR 30 (A) 03/15/2023 2000   NITRITE NEGATIVE 03/15/2023 2000   LEUKOCYTESUR TRACE (A) 03/15/2023 2000   Sepsis Labs Recent Labs  Lab 03/18/23 0435 03/19/23 0420 03/19/23 1038 03/20/23 0510  WBC 1.8* 1.6* 1.6* 2.1*   Microbiology Recent Results (from the past 240 hour(s))  Culture, blood (Routine x 2)     Status:  None   Collection Time: 03/15/23  6:43 PM   Specimen: Right Antecubital; Blood  Result Value Ref Range Status   Specimen Description RIGHT ANTECUBITAL  Final   Special Requests   Final    BOTTLES DRAWN AEROBIC AND ANAEROBIC Blood Culture results may not be optimal due to an excessive volume of blood received in culture bottles   Culture   Final    NO GROWTH 5 DAYS Performed at [facility], [address], [place], [place] [address]    Report Status 03/20/2023 FINAL  Final  Culture, blood (Routine x 2)     Status: None   Collection Time: 03/15/23  6:55 PM   Specimen: Porta Cath; Blood  Result Value Ref Range Status   Specimen Description PORTA CATH  Final   Special Requests   Final    BOTTLES DRAWN AEROBIC AND ANAEROBIC Blood Culture adequate volume   Culture   Final    NO GROWTH 5 DAYS Performed at [facility], [address], [place], [place] [address]    Report Status 03/20/2023 FINAL  Final  Urine Culture (for pregnant, neutropenic or urologic patients or patients with an indwelling urinary catheter)     Status: Abnormal   Collection Time: 03/15/23  8:08 PM   Specimen: Urine, Clean Catch  Result Value Ref Range Status   Specimen Description   Final    URINE, CLEAN CATCH Performed at [facility], [address] [place], [place] [address]    Special Requests   Final    NONE Performed at [facility], [address], [place], [place] [address]    Culture MULTIPLE SPECIES PRESENT, SUGGEST RECOLLECTION (A)  Final   Report Status 03/17/2023  FINAL  Final  MRSA Next Gen by PCR, Nasal     Status: None   Collection Time: 03/15/23  8:29 PM   Specimen: Nasal Mucosa; Nasal Swab  Result Value Ref Range Status   MRSA by PCR Next Gen NOT DETECTED NOT DETECTED Final    Comment: (NOTE) The GeneXpert MRSA Assay (FDA approved for NASAL specimens only), is one component of a comprehensive MRSA colonization surveillance program. It is not intended to diagnose MRSA infection nor to  guide or monitor treatment for MRSA infections. Test performance is not FDA approved in patients less than 11 years old. Performed at [facility], [address] [place], [place] [address]   Resp panel by RT-PCR (RSV, Flu A&B, Covid) Anterior Nasal Swab     Status: None   Collection Time: 03/15/23  9:30 PM   Specimen: Anterior Nasal Swab  Result Value Ref Range Status   SARS Coronavirus 2 by RT PCR NEGATIVE NEGATIVE Final    Comment: (NOTE) SARS-CoV-2 target nucleic acids are NOT DETECTED.  The SARS-CoV-2 RNA is generally detectable in upper respiratory specimens during the acute phase of infection. The lowest concentration of SARS-CoV-2 viral copies this assay can detect is 138 copies/mL. A negative result does not preclude SARS-Cov-2 infection and should not be used as the sole basis for treatment or other patient management decisions. A negative result may occur with  improper specimen collection/handling, submission of specimen other than nasopharyngeal swab, presence of viral mutation(s) within the areas targeted by this assay, and inadequate number of viral copies(<138 copies/mL). A negative result must be combined with clinical observations, patient history, and epidemiological information. The expected result is Negative.  Fact Sheet for Patients:  BloggerCourse.com  Fact Sheet for Healthcare Providers:  SeriousBroker.it  This test is no t yet approved or cleared by the [place] FDA and  has been authorized for detection and/or diagnosis of SARS-CoV-2 by FDA under an Emergency Use Authorization (EUA). This EUA will remain  in effect (meaning this test can be used) for the duration of the COVID-19 declaration under Section 564(b)(1) of the Act, 21 U.S.C.section 360bbb-3(b)(1), unless the authorization is terminated  or revoked sooner.       Influenza A by PCR NEGATIVE NEGATIVE Final   Influenza B by PCR NEGATIVE  NEGATIVE Final    Comment: (NOTE) The Xpert Xpress SARS-CoV-2/FLU/RSV plus assay is intended as an aid in the diagnosis of influenza from Nasopharyngeal swab specimens and should not be used as a sole basis for treatment. Nasal washings and aspirates are unacceptable for Xpert Xpress SARS-CoV-2/FLU/RSV testing.  Fact Sheet for Patients: BloggerCourse.com  Fact Sheet for Healthcare Providers: SeriousBroker.it  This test is not yet approved or cleared by the [place] FDA and has been authorized for detection and/or diagnosis of SARS-CoV-2 by FDA under an Emergency Use Authorization (EUA). This EUA will remain in effect (meaning this test can be used) for the duration of the COVID-19 declaration under Section 564(b)(1) of the Act, 21 U.S.C. section 360bbb-3(b)(1), unless the authorization is terminated or revoked.     Resp Syncytial Virus by PCR NEGATIVE NEGATIVE Final    Comment: (NOTE) Fact Sheet for Patients: BloggerCourse.com  Fact Sheet for Healthcare Providers: SeriousBroker.it  This test is not yet approved or cleared by the [place] FDA and has been authorized for detection and/or diagnosis of SARS-CoV-2 by FDA under an Emergency Use Authorization (EUA). This EUA will remain in effect (meaning this test can be used) for the  duration of the COVID-19 declaration under Section 564(b)(1) of the Act, 21 U.S.C. section 360bbb-3(b)(1), unless the authorization is terminated or revoked.  Performed at [facility], [address], [place], [place] [address]      Time coordinating discharge: Over 30 minutes  SIGNED:   Huey Bienenstock, MD  [facility] 03/20/2023, 1:32 PM Pager   If 7PM-7AM, please contact night-coverage [email or web address] Password [email or web address]

## 2023-03-20 NOTE — Plan of Care (Signed)

## 2023-03-20 NOTE — Progress Notes (Signed)
Mobility Specialist Progress Note;   03/20/23 1140  Mobility  Activity Ambulated with assistance in hallway  Level of Assistance Standby assist, set-up cues, supervision of patient - no hands on  Assistive Device Other (Comment) (Handrails)  Distance Ambulated (ft) 350 ft  Activity Response Tolerated well  Mobility Referral Yes  $Mobility charge 1 Mobility  Mobility Specialist Start Time (ACUTE ONLY) 1140  Mobility Specialist Stop Time (ACUTE ONLY) 1155  Mobility Specialist Time Calculation (min) (ACUTE ONLY) 15 min   Pt picked up in hallway from PT as pt was eager to walk more. Required no physical assistance during ambulation, SV. Took 1x standing rest break d/t fatigued legs, otherwise no c/o. Pt returned back to bed with all needs met.   Yvette Curry Mobility Specialist Please contact via SecureChat or Rehab Office [phone]

## 2023-03-20 NOTE — Progress Notes (Addendum)
[facility]  Date of Admission:  03/15/2023   Total days of inpatient antibiotics 3  Principal Problem:   Septic shock (HCC) Active Problems:   Adenocarcinoma of right lung, stage 4 (HCC)   Emphysema lung (HCC)   CAD (coronary artery disease)   Cellulitis   Acute hypoxic respiratory failure (HCC)   Acute on chronic combined systolic (congestive) and diastolic (congestive) heart failure [facility])          Assessment: 67 year old female with history of stage IV lung cancer on chemotherapy follow-up with Dr. Arbutus Ped, right lower extremity cellulitis, vascular stasis follow-up with vascular admitted for #Right lower extremity cellulitis in suppressed patient #Concern for drug secondary to antibiotics versus CHG bath - Pt was transitioned to doxy and cefadroxil as tolerated in the past for cellulitis, given concern for drug rash secondary to antibiotics versus CHG bath.  Of note rash was on her face as well. - Today 11/28 patient has worsening redness on her legs bilaterally and her chest, face looks somewhat better.  I will change her cefadroxil to Augmentin.  It is more likely that she had  reaction to cephlasporin rather than vancomycin. - Hold CHG baths(last given on 11/26. Plan: - DC cefadroxil - Start Augmentin - Continue doxycycline   Microbiology:   Antibiotics: Doxycycline and cefadroxil   SUBJECTIVE: Resting in bed.  Husband at bedside. Interval: Afebrile overnight.  Review of Systems: Review of Systems  All other systems reviewed and are negative.    Scheduled Meds:  amoxicillin-clavulanate  1 tablet Oral BID   ascorbic acid  500 mg Oral Daily   Budeson-Glycopyrrol-Formoterol  2 puff Inhalation BID   Chlorhexidine Gluconate Cloth  6 each Topical Daily   doxycycline  100 mg Oral Q12H   enoxaparin (LOVENOX) injection  30 mg Subcutaneous Q24H   feeding supplement  237 mL Oral TID BM   folic acid  1 mg Oral Daily   gabapentin   200 mg Oral BID   hydrocerin   Topical BID   midodrine  10 mg Oral TID WC   Continuous Infusions: PRN Meds:.acetaminophen **OR** acetaminophen, hydrOXYzine, ipratropium-albuterol, ondansetron **OR** ondansetron (ZOFRAN) IV, mouth rinse, polyethylene glycol, white petrolatum Allergies  Allergen Reactions   Lidocaine Shortness Of Breath and Anxiety    Patient felt like she "couldn't breathe", panicky Allergic to all " caines"   Mepivacaine Swelling    angioedema   Demerol Nausea And Vomiting   Prednisone Hives and Nausea And Vomiting    abd pain and vomiting, Hives    Sulfa Antibiotics Hives    Hives, swelling and itching    OBJECTIVE: Vitals:   03/19/23 1215 03/19/23 1220 03/19/23 1225 03/19/23 2005  BP: 131/71     Pulse: 82 87 91   Resp: (!) 22 16 (!) 28 20  Temp:    99.3 F (37.4 C)  TempSrc:    Oral  SpO2: 94%     Weight:      Height:       Body mass index is 25.41 kg/m.  Physical Exam Constitutional:      Appearance: Normal appearance.  HENT:     Head: Normocephalic and atraumatic.     Right Ear: Tympanic membrane normal.     Left Ear: Tympanic membrane normal.     Nose: Nose normal.     Mouth/Throat:     Mouth: Mucous membranes are moist.  Eyes:     Extraocular Movements:  Extraocular movements intact.     Conjunctiva/sclera: Conjunctivae normal.     Pupils: Pupils are equal, round, and reactive to light.  Cardiovascular:     Rate and Rhythm: Normal rate and regular rhythm.     Heart sounds: No murmur heard.    No friction rub. No gallop.  Pulmonary:     Effort: Pulmonary effort is normal.     Breath sounds: Normal breath sounds.  Abdominal:     General: Abdomen is flat.     Palpations: Abdomen is soft.  Musculoskeletal:        General: Normal range of motion.  Skin:    General: Skin is warm and dry.  Neurological:     General: No focal deficit present.     Mental Status: She is alert and oriented to person, place, and time.  Psychiatric:         Mood and Affect: Mood normal.       Lab Results Lab Results  Component Value Date   WBC 1.6 (L) 03/19/2023   HGB 8.6 (L) 03/19/2023   HCT 26.2 (L) 03/19/2023   MCV 112.0 (H) 03/19/2023   PLT 63 (L) 03/19/2023    Lab Results  Component Value Date   CREATININE 1.70 (H) 03/19/2023   BUN 24 (H) 03/19/2023   NA 138 03/19/2023   K 4.5 03/19/2023   CL 110 03/19/2023   CO2 23 03/19/2023    Lab Results  Component Value Date   ALT 15 03/15/2023   AST 28 03/15/2023   ALKPHOS 49 03/15/2023   BILITOT 1.1 03/15/2023        Danelle Earthly, MD [facility] 03/20/2023, 12:11 AM I have personally spent 52 minutes involved in face-to-face and non-face-to-face activities for this patient on the day of the visit. Professional time spent includes the following activities: Preparing to see the patient (review of tests), Obtaining and/or reviewing separately obtained history (admission/discharge record), Performing a medically appropriate examination and/or evaluation , Ordering medications/tests/procedures, referring and communicating with other health care professionals, Documenting clinical information in the EMR, Independently interpreting results (not separately reported), Communicating results to the patient/family/caregiver, Counseling and educating the patient/family/caregiver and Care coordination (not separately reported).

## 2023-03-20 NOTE — Plan of Care (Signed)

## 2023-03-23 ENCOUNTER — Encounter: Payer: Self-pay | Admitting: Internal Medicine

## 2023-03-23 ENCOUNTER — Telehealth: Payer: Self-pay | Admitting: Medical Oncology

## 2023-03-23 ENCOUNTER — Telehealth: Payer: Self-pay | Admitting: Family Medicine

## 2023-03-23 ENCOUNTER — Ambulatory Visit: Payer: Medicare Other | Attending: Nurse Practitioner | Admitting: Nurse Practitioner

## 2023-03-23 ENCOUNTER — Encounter: Payer: Self-pay | Admitting: Nurse Practitioner

## 2023-03-23 ENCOUNTER — Telehealth: Payer: Self-pay | Admitting: *Deleted

## 2023-03-23 VITALS — BP 110/60 | HR 105 | Ht 61.0 in | Wt 146.0 lb

## 2023-03-23 DIAGNOSIS — I5022 Chronic systolic (congestive) heart failure: Secondary | ICD-10-CM | POA: Diagnosis not present

## 2023-03-23 DIAGNOSIS — R000 Tachycardia, unspecified: Secondary | ICD-10-CM

## 2023-03-23 DIAGNOSIS — R5383 Other fatigue: Secondary | ICD-10-CM

## 2023-03-23 DIAGNOSIS — R600 Localized edema: Secondary | ICD-10-CM

## 2023-03-23 DIAGNOSIS — I5020 Unspecified systolic (congestive) heart failure: Secondary | ICD-10-CM | POA: Diagnosis not present

## 2023-03-23 DIAGNOSIS — N179 Acute kidney failure, unspecified: Secondary | ICD-10-CM

## 2023-03-23 DIAGNOSIS — L0390 Cellulitis, unspecified: Secondary | ICD-10-CM

## 2023-03-23 DIAGNOSIS — R531 Weakness: Secondary | ICD-10-CM

## 2023-03-23 DIAGNOSIS — A419 Sepsis, unspecified organism: Secondary | ICD-10-CM

## 2023-03-23 DIAGNOSIS — R0609 Other forms of dyspnea: Secondary | ICD-10-CM

## 2023-03-23 DIAGNOSIS — I959 Hypotension, unspecified: Secondary | ICD-10-CM | POA: Diagnosis not present

## 2023-03-23 DIAGNOSIS — I3139 Other pericardial effusion (noninflammatory): Secondary | ICD-10-CM

## 2023-03-23 DIAGNOSIS — C3491 Malignant neoplasm of unspecified part of right bronchus or lung: Secondary | ICD-10-CM

## 2023-03-23 DIAGNOSIS — D61818 Other pancytopenia: Secondary | ICD-10-CM

## 2023-03-23 MED ORDER — MIDODRINE HCL 5 MG PO TABS: 5.0000 mg | ORAL_TABLET | Freq: Three times a day (TID) | ORAL | 1 refills | Status: AC | PRN

## 2023-03-23 NOTE — Telephone Encounter (Signed)
PT IS CALLING IN NEEDING TO HAVE LABS AND A CHEST XRAY SHE WAS IN THE HOSPITAL FOR 6 DAYS DUE TO BEING SEPSIS SHE WOULD LIKE A CALL BACK REGARDING THIS

## 2023-03-23 NOTE — Transitions of Care (Post Inpatient/ED Visit) (Signed)
   03/23/2023  Name: Yvette Curry MRN: [id number] DOB: [birth year]  Today's TOC FU Call Status: Today's TOC FU Call Status:: Unsuccessful Call (1st Attempt) Unsuccessful Call (1st Attempt) Date: 03/23/23  Attempted to reach the patient regarding the most recent Inpatient/ED visit.  Follow Up Plan: Additional outreach attempts will be made to reach the patient to complete the Transitions of Care (Post Inpatient/ED visit) call.   [facility], BSN RN Care Manager/ Transition of Care [place]/ [facility] [phone]

## 2023-03-23 NOTE — Patient Instructions (Addendum)
Medication Instructions:  Your physician recommends that you continue on your current medications as directed. Please refer to the Current Medication list given to you today.  Labwork: None   Testing/Procedures: Your physician has requested that you have an echocardiogram. Echocardiography is a painless test that uses sound waves to create images of your heart. It provides your doctor with information about the size and shape of your heart and how well your heart's chambers and valves are working. This procedure takes approximately one hour. There are no restrictions for this procedure. Please do NOT wear cologne, perfume, aftershave, or lotions (deodorant is allowed). Please arrive 15 minutes prior to your appointment time.  Please note: We ask at that you not bring children with you during ultrasound (echo/ vascular) testing. Due to room size and safety concerns, children are not allowed in the ultrasound rooms during exams. Our front office staff cannot provide observation of children in our lobby area while testing is being conducted. An adult accompanying a patient to their appointment will only be allowed in the ultrasound room at the discretion of the ultrasound technician under special circumstances. We apologize for any inconvenience.  Follow-Up: Your physician recommends that you schedule a follow-up appointment in: keep f/u as scheduled   Any Other Special Instructions Will Be Listed Below (If Applicable).  If you need a refill on your cardiac medications before your next appointment, please call your pharmacy.

## 2023-03-23 NOTE — Telephone Encounter (Signed)
Yvette Curry provided update on pts recent hospitalization. She has PCP hospital f/u this week for cellulitis and sepsis.

## 2023-03-23 NOTE — Progress Notes (Unsigned)
Cardiology Office Note:  .   Date: 01/12/2023 ID:  Yvette Curry, DOB 1955/06/27, MRN [id number] PCP: [name], DO  [facility] Providers Cardiologist:  Marjo Bicker, MD    History of Present Illness: .   Yvette Curry is a 67 y.o. female with a PMH of HFmrEF, HTN, dizziness, swelling, and stage 4 lung cancer on chemotherapy, who presents today for swelling evaluation.   Diagnosed with stage IV lung cancer with metastasis in August 2023.  It was noted she was having chronic DOE, was referred to cardiology for evaluation.  Echocardiogram revealed mildly reduced EF at 45 to 50%, started on Lopressor 12.5 mg daily.  GDMT limited due to soft blood pressures.  Last seen by Dr. Jenene Slicker on October 09, 2022.  At this office visit, she noted dizziness with standing and sometimes with walking.  Orthostatics were negative for orthostatic hypotension/POTS.  Evaluated by vascular surgery for leg swelling likely due to chronic venous insufficiency and deemed to be secondary due to chemotherapy. 1 week monitor benign.  PFTs revealed minimal COPD and moderate diffusion defect which explained to her DOE.  Last seen for follow-up on December 01, 2022 for swelling evaluation with her husband.  Presented with multiple chief concerns.  Reported feeling fatigued, patient and husband stated BP had been labile, and husband reported SBP dropping to 70 at one time, had to receive IV fluids at [facility], also admitted to slightly fast heart rate, reported hx of [facility]. Had not been checking/logging BP consistently. Admitted to some dizziness at times.  Noted chronic leg edema, was on several diuretics that did not make a difference according to her report. Was wearing compression stockings and elevating her legs, said this did not help. Denied any chest pain, palpitations, syncope, presyncope, orthopnea, PND, significant weight changes, acute bleeding, or claudication. She also had some concerns/questions about her  recent lab work results. Admitted to chronic, stable DOE and denied any worsening symptoms related to her breathing.  01/12/2023- Presents today for follow-up with husband. Doesn't feel good. Has been getting chemo and recently has been having nausea. Presents BP and HR log. Overall shows HR in upper 90's/ low 100's. HR today averaging 120's- 130's with BP hypotensive today. BP log overall shows low normal SBP readings. Says she has been trying to stay well hydrated. Denies any chest pain, palpitations, syncope, presyncope, dizziness, orthopnea, PND, swelling or significant weight changes, acute bleeding, or claudication.  Does admit to feeling weaker and having intermittent shortness of breath more noticed with exertion, unsure if this appears worse for her. Has not taken midodrine at all according to her report.    Left ankle - sprained on rug 02/23/2023.    Kidney function is up. Lasix?  Midodrine - BP is much better. Not taking Midodrine scheduled.  BP is not great but better than it was.         FH: CHF, biological mother had LVAD and ICD. Biological sister had SSS, s/p PPM in her 40s LVEF 45%.   Studies Reviewed: Marland Kitchen    EKG:      Cardiac monitor 11/2022:    Patch wear time was 8 days.   Normal sinus rhythm predominantly ranging from 62 to 137 bpm with an average HR 91 bpm.   No atrial or ventricular arrhythmias.   No AV block or pauses.   <1% PAC burden and <1% PVC burden.   Patient triggered events correlated with NSR (80-130 BPM) and ectopy.  Vascular ultrasound lower right extremity 06/2022:  Summary:  Right:  - No evidence of deep vein thrombosis seen in the right lower extremity,  from the common femoral through the popliteal veins.  - No evidence of superficial venous thrombosis in the right lower  extremity.    - The deep venous system is incompetent.  - The great saphenous vein is incompetent.  - The small saphenous vein is incompetent.   *See table(s) above for  measurements and observations.  Echo 06/2022:  1. Left ventricular ejection fraction, by estimation, is 45 to 50%. Left  ventricular ejection fraction by 3D volume is 45 %. The left ventricle has  mildly decreased function. The left ventricle demonstrates global  hypokinesis. Left ventricular diastolic   parameters are consistent with Grade I diastolic dysfunction (impaired  relaxation).   2. Right ventricular systolic function is normal. The right ventricular  size is normal. There is normal pulmonary artery systolic pressure. The  estimated right ventricular systolic pressure is 16.0 mmHg.   3. The mitral valve is normal in structure. No evidence of mitral valve  regurgitation. No evidence of mitral stenosis.   4. The aortic valve is tricuspid. Aortic valve regurgitation is mild. No  aortic stenosis is present.   5. The inferior vena cava is normal in size with greater than 50%  respiratory variability, suggesting right atrial pressure of 3 mmHg.  Physical Exam:   VS:  There were no vitals taken for this visit.   Wt Readings from Last 3 Encounters:  03/17/23 134 lb 7.7 oz (61 kg)  03/11/23 145 lb 9.6 oz (66 kg)  03/09/23 146 lb (66.2 kg)    Repeat BP: 90/60  GEN: Well nourished, well developed in no acute distress NECK: No JVD; No carotid bruits CARDIAC: S1/S2, regular rhythm and fast rate, no murmurs, rubs, gallops RESPIRATORY:  Clear to auscultation without rales, wheezing or rhonchi  ABDOMEN: Soft, non-tender, non-distended EXTREMITIES:  Nonpitting edema to BLE; No deformity   ASSESSMENT AND PLAN: .    HFmrEF Stage C, NYHA class I-II symptoms. EF 45-50%. Euvolemic and well compensated on exam. Weight is stable. GDMT limited d/t BP today. Metoprolol has been on hold, pt has not taken midodrine per her report. Case d/w Dr. Jenene Slicker in office today, Dr. Jenene Slicker recommended to start Midodrine 5 mg TID and hold off GDMT at this time to prevent dizziness, which I am in  agreement with. Will continue to monitor her HR and BP and let us know her readings via MyChart. Hesitant to start SGLT2 inhibitor due to the fact that she has poor appetite currently while receiving chemo treatments. Low sodium diet, fluid restriction <2L, and daily weights encouraged. Educated to contact our office for weight gain of 2 lbs overnight or 5 lbs in one week. ED precautions discussed.  Hypotension, Hx of HTN, fatigue, weakness, tachycardia Etiology multifactorial. BP is hypotensive today.  Past orthostatics were negative.  HR elevated today. Will continue to hold metoprolol succinate d/t hypotension. Starting midodrine 5 mg TID as mentioned above. Discussed to monitor BP at home at least 2 hours after medications and sitting for 5-10 minutes. Given BP log. ED precautions discussed.  Leg edema Nonpitting, stable symptoms on exam. This is secondary due to chronic venous insufficiency.  No evidence of DVT on Doppler from January 2024.  Vascular ultrasound study in March 2024 showed incompetent right lower extremity veins with no evidence of DVT along the right lower extremity.  Hesitant to start diuretic  at this time, she states this has not been beneficial for her in the past and also d/t hx of drops in SBP.  Continue compression stockings and leg elevation. Low salt, heart healthy diet and regular cardiovascular exercise encouraged.   4. Pericardial effusion Noted on CT scan 10/2022. Noted to be new small pericardial effusion without evidence of cardiac tamponade. Will continue to monitor over time.   5. Stage 4 lung cancer, chronic DOE Chronic, overall symptoms appear stable. Continue to follow-up with Oncology, Pulmonology, and PCP as scheduled.   Dispo: Care and ED precautions discussed. Follow-up with me or APP in 4-6 weeks or sooner if anything changes.   Signed, Sharlene Dory, NP

## 2023-03-24 ENCOUNTER — Ambulatory Visit (HOSPITAL_COMMUNITY)
Admission: RE | Admit: 2023-03-24 | Discharge: 2023-03-24 | Disposition: A | Discharge status: Home / Self Care | Payer: Medicare Other | Source: Ambulatory Visit | Attending: Family Medicine | Admitting: Family Medicine

## 2023-03-24 ENCOUNTER — Telehealth: Payer: Self-pay | Admitting: *Deleted

## 2023-03-24 DIAGNOSIS — A419 Sepsis, unspecified organism: Secondary | ICD-10-CM | POA: Diagnosis present

## 2023-03-24 NOTE — Telephone Encounter (Signed)
Copied from CRM [id number]. Topic: Clinical - Request for Lab/Test Order >> Mar 24, 2023  9:01 AM Amy B wrote: Reason for CRM: Patient's husband called stating that the patient has a hospital followup scheduled on 12/6 and wants to know if the patient needs to have a chest x-ray and lab work done prior to her appointment. Please advise

## 2023-03-24 NOTE — Telephone Encounter (Signed)
Blood work ordered in [employer], CXR in [employer]

## 2023-03-24 NOTE — Transitions of Care (Post Inpatient/ED Visit) (Signed)
03/24/2023  Name: Yvette Curry MRN: [id number] DOB: [birth year]  Today's TOC FU Call Status: Today's TOC FU Call Status:: Successful TOC FU Call Completed TOC FU Call Complete Date: 03/24/23 Patient's Name and Date of Birth confirmed.  Transition Care Management Follow-up Telephone Call Date of Discharge: 03/20/23 Discharge Facility: [facility]) Type of Discharge: Inpatient Admission Primary Inpatient Discharge Diagnosis:: Septic Shock How have you been since you were released from the hospital?:  (pt states she is feeling better, continues to have chronic cellulitis, has stage 4 lung CA and receives chemo Q 3 weeks) Any questions or concerns?: No  Items Reviewed: Did you receive and understand the discharge instructions provided?: No Medications obtained,verified, and reconciled?: Yes (Medications Reviewed) Any new allergies since your discharge?: No Dietary orders reviewed?: Yes Type of Diet Ordered:: low sodium heart healthy Do you have support at home?: Yes People in Home: spouse Name of Support/Comfort Primary Source: Yvette Curry Reviewed signs/ symptoms of infection/ sepsis  Medications Reviewed Today: Medications Reviewed Today     Reviewed by Audrie Gallus, RN (Registered Nurse) on 03/24/23 at 1148  Med List Status: <None>   Medication Order Taking? Sig Documenting Provider Last Dose Status Informant  acetaminophen (TYLENOL) 500 MG tablet [id number] Yes Take 500 mg by mouth as needed for mild pain (pain score 1-3), moderate pain (pain score 4-6) or fever. [provider] Taking Active Self, Spouse/Significant Other, Pharmacy Records  amoxicillin-clavulanate (AUGMENTIN) 500-125 MG tablet [id number] No Take 1 tablet by mouth 2 (two) times daily for 4 days.  Patient not taking: Reported on 03/24/2023   Danelle Earthly, MD Not Taking Active   ascorbic acid (VITAMIN C) 500 MG tablet [id number] Yes Take 1,000 mg by mouth daily. [provider] Taking  Active Self, Spouse/Significant Other, Pharmacy Records  Blood Pressure Monitoring (OMRON 3 SERIES BP MONITOR) DEVI [id number] Yes Use as directed Sharlene Dory, NP Taking Active Self, Spouse/Significant Other, Pharmacy Records  Budeson-Glycopyrrol-Formoterol (BREZTRI AEROSPHERE) 160-9-4.8 MCG/ACT AERO [id number] Yes Inhale 2 puffs into the lungs in the morning and at bedtime.  Patient taking differently: Inhale 2 puffs into the lungs daily.   Josephine Igo, DO Taking Active Self, Spouse/Significant Other, Pharmacy Records  carboxymethylcellulose 1 % ophthalmic solution [id number] Yes Apply 1-2 drops to eye as needed (dry eye). [provider] Taking Active Self, Spouse/Significant Other, Pharmacy Records  doxycycline (VIBRA-TABS) 100 MG tablet [id number] No Take 1 tablet (100 mg total) by mouth every 12 (twelve) hours for 4 days.  Patient not taking: Reported on 03/24/2023   Danelle Earthly, MD Not Taking Active   ferrous sulfate 324 MG TBEC [id number] Yes Take 1 tablet (324 mg total) by mouth daily with breakfast. Shon Hale, MD Taking Active Self, Spouse/Significant Other, Pharmacy Records  folic acid (FOLVITE) 1 MG tablet [id number] Yes Take 1 tablet (1 mg total) by mouth daily. Shon Hale, MD Taking Active Self, Spouse/Significant Other, Pharmacy Records  gabapentin (NEURONTIN) 100 MG capsule [id number] Yes Take 2 capsules (200 mg total) by mouth 2 (two) times daily.  Patient taking differently: Take 100 mg by mouth 2 (two) times daily.   [name], DO Taking Active Self, Spouse/Significant Other, Pharmacy Records  lidocaine (LMX) 4 % cream [id number] Yes Apply topically 3 (three) times daily as needed (leg pain).  Patient taking differently: Apply 1 Application topically as needed (leg pain).   Jonah Blue, MD Taking Active Self, Spouse/Significant Other, Pharmacy Records  midodrine (PROAMATINE) 5 MG tablet [id number] Yes Take  1 tablet (5 mg total) by mouth 3 (three)  times daily as needed (If your Systolic Blood Pressure is less than 90). Sharlene Dory, NP Taking Active   oxyCODONE (OXY IR/ROXICODONE) 5 MG immediate release tablet [id number] No Take 0.5-1 tablets (2.5-5 mg total) by mouth every 4 (four) hours as needed for severe pain (pain score 7-10) or moderate pain (pain score 4-6) (2.5 mg for moderate pain and 5 mg for severe pain).  Patient not taking: Reported on 03/16/2023   Jonah Blue, MD Not Taking Active Self, Spouse/Significant Other, Pharmacy Records  pantoprazole (PROTONIX) 40 MG tablet [id number] Yes Take 40 mg by mouth daily. [provider] Taking Active Self, Spouse/Significant Other, Pharmacy Records  Probiotic Product (PROBIOTIC PO) [id number] Yes Take 1 capsule by mouth daily. [provider] Taking Active Self, Spouse/Significant Other, Pharmacy Records  prochlorperazine (COMPAZINE) 10 MG tablet [id number] Yes Take 10 mg by mouth as needed for nausea or vomiting. Every 3 weeks before chemo [provider] Taking Active Self, Spouse/Significant Other, Pharmacy Records  Med List Note (Ward, Angelica, CPhT 02/04/23 1552): Also receives eye injections for swelling posterior to retina            Home Care and Equipment/Supplies: Were Home Health Services Ordered?: No Any new equipment or medical supplies ordered?: No  Functional Questionnaire: Do you need assistance with bathing/showering or dressing?: No Do you need assistance with meal preparation?: No Do you need assistance with eating?: No Do you have difficulty maintaining continence: No Do you need assistance with getting out of bed/getting out of a chair/moving?: No Do you have difficulty managing or taking your medications?: No  Follow up appointments reviewed: PCP Follow-up appointment confirmed?: Yes Date of PCP follow-up appointment?: 03/27/23 Follow-up Provider: primary care  @ 110 pm Specialist Hospital Follow-up appointment confirmed?:  Yes Date of Specialist follow-up appointment?: 03/30/23 Follow-Up Specialty Provider:: pulmonary  @ 9 am     saw cardiologist 03/23/23 Do you need transportation to your follow-up appointment?: No (spouse provides transportation) Do you understand care options if your condition(s) worsen?: Yes-patient verbalized understanding  SDOH Interventions Today    Flowsheet Row Most Recent Value  SDOH Interventions   Food Insecurity Interventions Intervention Not Indicated  Housing Interventions Intervention Not Indicated  Transportation Interventions Intervention Not Indicated  Utilities Interventions Intervention Not Indicated       Irving Shows [facility], BSN RN Care Manager/ Transition of Care [place]/ [facility] Population Health [phone]

## 2023-03-24 NOTE — Telephone Encounter (Signed)
Blood work is CBC, CMP and a two view chest x-ray prior to seeing the primary care doctor

## 2023-03-24 NOTE — Telephone Encounter (Signed)
[name], DO     Go ahead and place the orders please.

## 2023-03-24 NOTE — Telephone Encounter (Signed)
Patient notified

## 2023-03-25 ENCOUNTER — Encounter: Payer: Self-pay | Admitting: Internal Medicine

## 2023-03-25 LAB — CBC WITH DIFFERENTIAL/PLATELET
Basophils Absolute: 0 10*3/uL (ref 0.0–0.2)
Basos: 0 %
EOS (ABSOLUTE): 0.2 10*3/uL (ref 0.0–0.4)
Eos: 3 %
Hematocrit: 31.8 % — ABNORMAL LOW (ref 34.0–46.6)
Hemoglobin: 10.5 g/dL — ABNORMAL LOW (ref 11.1–15.9)
Immature Grans (Abs): 0.1 10*3/uL (ref 0.0–0.1)
Immature Granulocytes: 1 %
Lymphocytes Absolute: 1.7 10*3/uL (ref 0.7–3.1)
Lymphs: 34 %
MCH: 35.5 pg — ABNORMAL HIGH (ref 26.6–33.0)
MCHC: 33 g/dL (ref 31.5–35.7)
MCV: 107 fL — ABNORMAL HIGH (ref 79–97)
Monocytes Absolute: 0.9 10*3/uL (ref 0.1–0.9)
Monocytes: 18 %
Neutrophils Absolute: 2.2 10*3/uL (ref 1.4–7.0)
Neutrophils: 44 %
Platelets: 103 10*3/uL — ABNORMAL LOW (ref 150–450)
RBC: 2.96 x10E6/uL — ABNORMAL LOW (ref 3.77–5.28)
RDW: 14.5 % (ref 11.7–15.4)
WBC: 5 10*3/uL (ref 3.4–10.8)

## 2023-03-25 LAB — COMPREHENSIVE METABOLIC PANEL
ALT: 20 [IU]/L (ref 0–32)
AST: 33 [IU]/L (ref 0–40)
Albumin: 4.1 g/dL (ref 3.9–4.9)
Alkaline Phosphatase: 77 [IU]/L (ref 44–121)
BUN/Creatinine Ratio: 26 (ref 12–28)
BUN: 33 mg/dL — ABNORMAL HIGH (ref 8–27)
Bilirubin Total: 0.2 mg/dL (ref 0.0–1.2)
CO2: 23 mmol/L (ref 20–29)
Calcium: 10.4 mg/dL — ABNORMAL HIGH (ref 8.7–10.3)
Chloride: 108 mmol/L — ABNORMAL HIGH (ref 96–106)
Creatinine, Ser: 1.28 mg/dL — ABNORMAL HIGH (ref 0.57–1.00)
Globulin, Total: 2.7 g/dL (ref 1.5–4.5)
Glucose: 99 mg/dL (ref 70–99)
Potassium: 5.1 mmol/L (ref 3.5–5.2)
Sodium: 144 mmol/L (ref 134–144)
Total Protein: 6.8 g/dL (ref 6.0–8.5)
eGFR: 46 mL/min/{1.73_m2} — ABNORMAL LOW (ref >59)

## 2023-03-26 ENCOUNTER — Ambulatory Visit (INDEPENDENT_AMBULATORY_CARE_PROVIDER_SITE_OTHER): Payer: Medicare Other | Admitting: Surgery

## 2023-03-26 ENCOUNTER — Encounter: Payer: Self-pay | Admitting: Surgery

## 2023-03-26 ENCOUNTER — Ambulatory Visit (HOSPITAL_COMMUNITY)
Admission: RE | Admit: 2023-03-26 | Discharge: 2023-03-26 | Disposition: A | Discharge status: Home / Self Care | Payer: Medicare Other | Source: Ambulatory Visit | Attending: Physician Assistant | Admitting: Physician Assistant

## 2023-03-26 VITALS — BP 137/86 | HR 102 | Temp 98.0°F | Wt 146.0 lb

## 2023-03-26 DIAGNOSIS — I83891 Varicose veins of right lower extremities with other complications: Secondary | ICD-10-CM | POA: Diagnosis not present

## 2023-03-26 DIAGNOSIS — C3491 Malignant neoplasm of unspecified part of right bronchus or lung: Secondary | ICD-10-CM | POA: Insufficient documentation

## 2023-03-26 MED ORDER — HEPARIN SOD (PORK) LOCK FLUSH 100 UNIT/ML IV SOLN
500.0000 [IU] | Freq: Once | INTRAVENOUS | Status: AC
  Administered 2023-03-26: 500 [IU] via INTRAVENOUS

## 2023-03-26 MED ORDER — IOHEXOL 300 MG/ML  SOLN
100.0000 mL | Freq: Once | INTRAMUSCULAR | Status: AC | PRN
  Administered 2023-03-26: 80 mL via INTRAVENOUS

## 2023-03-26 NOTE — Progress Notes (Signed)
[facility] [place]  Patient name: Yvette Curry MRN: [id number] DOB: 15-Dec-1955 Sex: female   REASON FOR VISIT:    Follow up  HISOTRY OF PRESENT ILLNESS:    Yvette Curry is a 67 y.o. female who I have evaluated in the past for right leg swelling.  She has failed conservative measures including diuretics, elevation, and compression.  She has had multiple bouts of right leg cellulitis.  She has no history of DVT.  We had contemplated laser ablation however apparently she has a allergy to local anesthetics.  She has had recurrent episodes of sepsis secondary to right leg cellulitis, having recently been hospitalized.   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 This was diagnosed in August 2023 she has undergone chemotherapy   PAST MEDICAL HISTORY:   Past Medical History:  Diagnosis Date   Anxiety    no current tx   Depression    no meds at present   Dyspnea    Family history of adverse reaction to anesthesia    pt states mom had allergic reaction to some unknown anesthesia   GERD (gastroesophageal reflux disease)    no tx since weight loss   Hypercholesteremia    Osteoarthritis    stage IV lung ca 11/2021     FAMILY HISTORY:   Family History  Problem Relation Age of Onset   Heart disease Mother    Kidney cancer Mother    Emphysema Father    Heart disease Father    Colon cancer Neg Hx     SOCIAL HISTORY:   Social History   Tobacco Use   Smoking status: Every Day    Current packs/day: 0.50    Average packs/day: 0.5 packs/day for 37.0 years (18.5 ttl pk-yrs)    Types: Cigarettes    Passive exposure: Current   Smokeless tobacco: Never   Tobacco comments:    Smokes half a pack of cigarettes a day.  12/18/2022 Tay  Substance Use Topics   Alcohol use: No     ALLERGIES:   Allergies  Allergen Reactions   Lidocaine Shortness Of Breath and Anxiety    Patient felt like she "couldn't breathe", panicky Allergic to all " caines"   Mepivacaine Swelling    angioedema   Demerol Nausea And Vomiting   Prednisone Hives and Nausea And Vomiting    abd pain and vomiting, Hives    Sulfa Antibiotics Hives    Hives, swelling and itching     CURRENT MEDICATIONS:   Current Outpatient Medications  Medication Sig Dispense Refill   acetaminophen (TYLENOL) 500 MG tablet Take 500 mg by mouth as needed for mild pain (pain score 1-3), moderate pain (pain score 4-6) or fever.     ascorbic acid (VITAMIN C) 500 MG tablet Take 1,000 mg by mouth daily.     Blood Pressure Monitoring (OMRON 3 SERIES BP MONITOR) DEVI Use as directed 1 each 1   Budeson-Glycopyrrol-Formoterol (BREZTRI AEROSPHERE) 160-9-4.8 MCG/ACT AERO Inhale 2 puffs into the lungs in the morning and at bedtime. (Patient taking differently: Inhale 2 puffs into the lungs daily.) 10.7 g 3   carboxymethylcellulose 1 % ophthalmic solution Apply 1-2 drops to eye as needed (dry eye).     ferrous sulfate 324 MG TBEC Take 1 tablet (324 mg total) by  mouth daily with breakfast. 30 tablet 4   folic acid (FOLVITE) 1 MG tablet Take 1 tablet (1 mg total) by mouth daily. 90 tablet 1   gabapentin (NEURONTIN) 100 MG capsule Take 2 capsules (200 mg total) by mouth 2 (two) times daily. (Patient taking differently: Take 100 mg by mouth 2 (two) times daily.) 360 capsule 1   lidocaine (LMX) 4 % cream Apply topically 3 (three) times daily as needed (leg pain). (Patient taking differently: Apply 1 Application topically as needed (leg pain).) 30 g 0   midodrine (PROAMATINE) 5 MG tablet Take 1 tablet (5 mg total) by mouth 3 (three) times daily as needed (If your Systolic Blood Pressure is less than 90). 90 tablet 1   pantoprazole (PROTONIX) 40 MG tablet Take 40 mg by mouth  daily.     Probiotic Product (PROBIOTIC PO) Take 1 capsule by mouth daily.     prochlorperazine (COMPAZINE) 10 MG tablet Take 10 mg by mouth as needed for nausea or vomiting. Every 3 weeks before chemo     No current facility-administered medications for this visit.    REVIEW OF SYSTEMS:   [X]  denotes positive finding, [ ]  denotes negative finding Cardiac  Comments:  Chest pain or chest pressure:    Shortness of breath upon exertion:    Short of breath when lying flat:    Irregular heart rhythm:        Vascular    Pain in calf, thigh, or hip brought on by ambulation:    Pain in feet at night that wakes you up from your sleep:     Blood clot in your veins:    Leg swelling:  xx       Pulmonary    Oxygen at home:    Productive cough:     Wheezing:         Neurologic    Sudden weakness in arms or legs:     Sudden numbness in arms or legs:     Sudden onset of difficulty speaking or slurred speech:    Temporary loss of vision in one eye:     Problems with dizziness:         Gastrointestinal    Blood in stool:     Vomited blood:         Genitourinary    Burning when urinating:     Blood in urine:        Psychiatric    Major depression:         Hematologic    Bleeding problems:    Problems with blood clotting too easily:        Skin    Rashes or ulcers:        Constitutional    Fever or chills:      PHYSICAL EXAM:   Vitals:   03/26/23 1023  BP: 137/86  Pulse: (!) 102  Temp: 98 F (36.7 C)  TempSrc: Temporal  SpO2: 99%  Weight: 146 lb (66.2 kg)    GENERAL: The patient is a well-nourished female, in no acute distress. The vital signs are documented above. CARDIAC: There is a regular rate and rhythm.  VASCULAR: Bilateral lower extremity edema.  Brawny discoloration of the right leg PULMONARY: Non-labored respirations ABDOMEN: Soft and non-tender with normal pitched bowel sounds.  MUSCULOSKELETAL: There are no major deformities or cyanosis. NEUROLOGIC:  No focal weakness or paresthesias are detected. SKIN: There are no ulcers or rashes noted. PSYCHIATRIC: The patient has a  normal affect.  STUDIES:   I have reviewed her vascular lab studies: Venous Reflux Times  +--------------+---------+------+-----------+------------+-----------------  ----+  RIGHT        Reflux NoRefluxReflux TimeDiameter cmsComments                                        Yes                                                 +--------------+---------+------+-----------+------------+-----------------  ----+  CFV                    yes   >1 second                                     +--------------+---------+------+-----------+------------+-----------------  ----+  FV mid                  yes   >1 second                                     +--------------+---------+------+-----------+------------+-----------------  ----+  Popliteal    no                                                            +--------------+---------+------+-----------+------------+-----------------  ----+  GSV at [facility]              yes    >500 ms      0.98                            +--------------+---------+------+-----------+------------+-----------------  ----+  GSV prox thigh          yes    >500 ms      0.52                            +--------------+---------+------+-----------+------------+-----------------  ----+  GSV mid thigh           yes    >500 ms      0.53                            +--------------+---------+------+-----------+------------+-----------------  ----+  GSV dist thigh          yes    >500 ms      0.50                            +--------------+---------+------+-----------+------------+-----------------  ----+  GSV at knee             yes    >500 ms      0.42                            +--------------+---------+------+-----------+------------+-----------------  ----+  GSV prox calf            yes    >500 ms      0.36                            +--------------+---------+------+-----------+------------+-----------------  ----+  GSV mid calf            yes    >500 ms      0.28                            +--------------+---------+------+-----------+------------+-----------------  ----+  SSV Pop Fossa no                            0.20    Does not drain  into                                                        popliteal vein          +--------------+---------+------+-----------+------------+-----------------  ----+  SSV prox calf           yes    >500 ms      0.21                            +--------------+---------+------+-----------+------------+-----------------  ----+  SSV mid calf            yes    >500 ms      0.21                            +--------------+---------+------+-----------+------------+-----------------  ----+       MEDICAL ISSUES:   CEAP class IV, right leg: I had a lengthy discussion with the patient and her husband.  This is a relatively complicated situation for multiple reasons.  We had originally contemplated laser ablation of the right saphenous vein to minimize the risk of recurrence of her cellulitis as well as her significant symptoms.  Unfortunately, we found out that she has an allergy to lidocaine which is a component of our tumescent solution and local anesthesia therefore, I do not think that she can have a laser ablation in the office.  Thus, our only option to treat her venous reflux and recurrent cellulitis is to do this in the operating room with a vein stripping.  She is dealing with stage IV lung cancer.  Her oncology notes suggest that in order to do the procedure she would need to be off of her chemotherapy 2 weeks prior and 2 weeks postprocedure.  This certainly could play a role in the progression of her lung cancer.  In addition, she would be at risk for wound complications given her  immunocompromise.  She is scheduled for repeat CT scan imaging of her lung cancer and follow-up this week.  She is going to determine at that time whether or not she wants to proceed.  I advised her that she could also be fitted for custom compression stockings since she is having difficulty with her current once due to poor fitting.  If she cannot tolerate compression therapy alone, that would probably be  our best option however she may decide to proceed with vein stripping which would need to be done in the operating room under general anesthesia.  Talked about the risks of general anesthesia and risk of pneumonia as well as possible wound complications, and delay in cancer treatment.  She will contact me with her final decision.    Charlena Cross, MD, FACS [facility] [phone] Pager [phone]

## 2023-03-27 ENCOUNTER — Ambulatory Visit: Payer: Medicare Other | Admitting: Family Medicine

## 2023-03-27 VITALS — BP 110/70 | HR 98 | Ht 61.0 in | Wt 146.0 lb

## 2023-03-27 DIAGNOSIS — A419 Sepsis, unspecified organism: Secondary | ICD-10-CM | POA: Diagnosis not present

## 2023-03-27 DIAGNOSIS — N179 Acute kidney failure, unspecified: Secondary | ICD-10-CM

## 2023-03-27 DIAGNOSIS — B370 Candidal stomatitis: Secondary | ICD-10-CM | POA: Diagnosis not present

## 2023-03-27 DIAGNOSIS — R6521 Severe sepsis with septic shock: Secondary | ICD-10-CM | POA: Diagnosis not present

## 2023-03-27 DIAGNOSIS — C3491 Malignant neoplasm of unspecified part of right bronchus or lung: Secondary | ICD-10-CM | POA: Diagnosis not present

## 2023-03-27 MED ORDER — NYSTATIN 100000 UNIT/ML MT SUSP: 5.0000 mL | Freq: Four times a day (QID) | OROMUCOSAL | 3 refills | Status: AC

## 2023-03-27 NOTE — Patient Instructions (Signed)
Continue your medications.  Follow up in 1 month.  Take care  Dr. Adriana Simas

## 2023-03-29 DIAGNOSIS — B370 Candidal stomatitis: Secondary | ICD-10-CM | POA: Insufficient documentation

## 2023-03-29 DIAGNOSIS — N179 Acute kidney failure, unspecified: Secondary | ICD-10-CM | POA: Insufficient documentation

## 2023-03-29 NOTE — Assessment & Plan Note (Signed)
I ordered labs post admission. Creatinine improved.

## 2023-03-29 NOTE — Assessment & Plan Note (Signed)
Recent CT shows stability/no worsening.

## 2023-03-29 NOTE — Assessment & Plan Note (Signed)
Due to cellulitis. Now resolved. Doing well at this time after admission.  Advised to continue to wear compression stockings regularly.

## 2023-03-29 NOTE — Assessment & Plan Note (Signed)
Nystatin as directed

## 2023-03-29 NOTE — Progress Notes (Signed)
Subjective:  Patient ID: Yvette Curry, female    DOB: 1955-08-09  Age: 67 y.o. MRN: [id number]  CC:   Chief Complaint  Patient presents with   Hospitalization Follow-up    HPI:  67 year old female with Lung Cancer (stage 4), recurrent cellulitis, COPD, CAD, HFrEF presents for hospital follow up.  Patient recently admitted from 11/24 to 11/29.  Hospital course, discharge summary, labs, imaging reviewed.  In summary: Presented with lightheadedness and pain and erythema of the right lower extremity.  Was febrile, tachycardic, and hypotensive.  Diagnosed with septic shock secondary to cellulitis and transferred to the ICU. Patient was also hypoxic.  She was treated with antibiotics, pressors, oxygen therapy.  Improved during admission and came off pressors.  Discharged home on antibiotics and midodrine. During admission there was evidence of pleural effusions on x-ray.  Per the discharge summary, no evidence of volume overload at the time of discharge.  Treatment of congestive heart failure limited due to BP.  Patient presents today for follow-up.  Has had recent follow-up labs and imaging. She is feeling improved.  She has been seen by vascular.  Wearing compression stockings currently.  There has been discussions of surgery for varicosity of the lower extremity but this is limited in the setting of her current cancer treatment.  Patient reports that she is concerned that she has thrush.  She would like me to examine this today.  BP currently stable.  She is only using midodrine if needed.  Patient Active Problem List   Diagnosis Date Noted   AKI (acute kidney injury) (HCC) 03/29/2023   Thrush 03/29/2023   Septic shock (HCC) 03/15/2023   Acute on chronic combined systolic (congestive) and diastolic (congestive) heart failure (HCC) 03/15/2023   Encounter for antineoplastic immunotherapy 03/11/2023   CAD (coronary artery disease) 02/17/2023   Ascending aorta dilation (HCC)  02/17/2023   Macrocytic anemia 12/10/2022   Loss of weight 12/10/2022   Pancytopenia (HCC) 10/20/2022   HFrEF (heart failure with reduced ejection fraction) (HCC) 07/01/2022   Aortic atherosclerosis (HCC) 06/04/2022   Emphysema lung (HCC) 06/04/2022   Neutropenia (HCC) 03/27/2022   Encounter for antineoplastic chemotherapy 03/19/2022   Adenocarcinoma of right lung, stage 4 (HCC) 12/12/2021   Tobacco abuse 11/06/2014    Social Hx   Social History   Socioeconomic History   Marital status: Married    Spouse name: Adela Lank   Number of children: Y   Years of education: Not on file   Highest education level: Not on file  Occupational History   Occupation: Personnel officer: UNEMPLOYED  Tobacco Use   Smoking status: Every Day    Current packs/day: 0.50    Average packs/day: 0.5 packs/day for 37.0 years (18.5 ttl pk-yrs)    Types: Cigarettes    Passive exposure: Current   Smokeless tobacco: Never   Tobacco comments:    Smokes half a pack of cigarettes a day. 12/18/2022 Tay  Vaping Use   Vaping status: Former  Substance and Sexual Activity   Alcohol use: No   Drug use: No   Sexual activity: Yes    Comment: hysterectomy  Other Topics Concern   Not on file  Social History Narrative   Not on file   Social Determinants of Health   Financial Resource Strain: Not on file  Food Insecurity: No Food Insecurity (03/24/2023)   Hunger Vital Sign    Worried About Running Out of Food in the Last Year: Never true  Ran Out of Food in the Last Year: Never true  Transportation Needs: No Transportation Needs (03/24/2023)   PRAPARE - Administrator, Civil Service (Medical): No    Lack of Transportation (Non-Medical): No  Physical Activity: Not on file  Stress: Not on file  Social Connections: Not on file    Review of Systems Per HPI  Objective:  BP 110/70   Pulse 98   Ht 5\' 1"  (1.549 m)   Wt 146 lb (66.2 kg)   SpO2 98%   BMI 27.59 kg/m      03/27/2023    1:16  PM 03/26/2023   10:23 AM 03/23/2023    3:28 PM  BP/Weight  Systolic BP 110 137 110  Diastolic BP 70 86 60  Wt. (Lbs) 146 146 146  BMI 27.59 kg/m2 27.59 kg/m2 27.59 kg/m2    Physical Exam Vitals and nursing note reviewed.  Constitutional:      General: She is not in acute distress. HENT:     Head: Normocephalic and atraumatic.  Eyes:     General:        Right eye: No discharge.        Left eye: No discharge.     Conjunctiva/sclera: Conjunctivae normal.  Cardiovascular:     Rate and Rhythm: Normal rate and regular rhythm.  Pulmonary:     Effort: Pulmonary effort is normal. No respiratory distress.  Neurological:     Mental Status: She is alert.  Psychiatric:        Mood and Affect: Mood normal.        Behavior: Behavior normal.     Lab Results  Component Value Date   WBC 5.0 03/24/2023   HGB 10.5 (L) 03/24/2023   HCT 31.8 (L) 03/24/2023   PLT 103 (L) 03/24/2023   GLUCOSE 99 03/24/2023   ALT 20 03/24/2023   AST 33 03/24/2023   NA 144 03/24/2023   K 5.1 03/24/2023   CL 108 (H) 03/24/2023   CREATININE 1.28 (H) 03/24/2023   BUN 33 (H) 03/24/2023   CO2 23 03/24/2023   TSH 1.995 02/18/2023   INR 1.0 03/15/2023     Assessment & Plan:   Problem List Items Addressed This Visit       Respiratory   Adenocarcinoma of right lung, stage 4 (HCC) (Chronic)    Recent CT shows stability/no worsening.        Digestive   Thrush    Nystatin as directed.      Relevant Medications   nystatin (MYCOSTATIN) 100000 UNIT/ML suspension     Genitourinary   AKI (acute kidney injury) (HCC)    I ordered labs post admission. Creatinine improved.        Other   Septic shock (HCC) - Primary    Due to cellulitis. Now resolved. Doing well at this time after admission.  Advised to continue to wear compression stockings regularly.       Relevant Medications   nystatin (MYCOSTATIN) 100000 UNIT/ML suspension    Meds ordered this encounter  Medications   nystatin  (MYCOSTATIN) 100000 UNIT/ML suspension    Sig: Take 5 mLs (500,000 Units total) by mouth 4 (four) times daily for 7 days. Swish and swallow.    Dispense:  140 mL    Refill:  3    Follow-up:  1 month  Leobardo Granlund DO [facility]

## 2023-03-30 ENCOUNTER — Encounter: Payer: Self-pay | Admitting: Internal Medicine

## 2023-03-30 ENCOUNTER — Ambulatory Visit: Payer: Medicare Other | Admitting: Nurse Practitioner

## 2023-03-30 ENCOUNTER — Encounter: Payer: Self-pay | Admitting: Nurse Practitioner

## 2023-03-30 VITALS — BP 128/84 | HR 96 | Ht 61.0 in | Wt 146.6 lb

## 2023-03-30 DIAGNOSIS — L03115 Cellulitis of right lower limb: Secondary | ICD-10-CM

## 2023-03-30 DIAGNOSIS — J449 Chronic obstructive pulmonary disease, unspecified: Secondary | ICD-10-CM | POA: Diagnosis not present

## 2023-03-30 DIAGNOSIS — C3491 Malignant neoplasm of unspecified part of right bronchus or lung: Secondary | ICD-10-CM

## 2023-03-30 DIAGNOSIS — J90 Pleural effusion, not elsewhere classified: Secondary | ICD-10-CM | POA: Diagnosis not present

## 2023-03-30 DIAGNOSIS — I5020 Unspecified systolic (congestive) heart failure: Secondary | ICD-10-CM | POA: Diagnosis not present

## 2023-03-30 DIAGNOSIS — J439 Emphysema, unspecified: Secondary | ICD-10-CM

## 2023-03-30 MED ORDER — BREZTRI AEROSPHERE 160-9-4.8 MCG/ACT IN AERO: 2.0000 | INHALATION_SPRAY | Freq: Two times a day (BID) | RESPIRATORY_TRACT | 3 refills | Status: DC

## 2023-03-30 MED ORDER — FUROSEMIDE 40 MG PO TABS: 40.0000 mg | ORAL_TABLET | Freq: Every day | ORAL | 0 refills | Status: DC

## 2023-03-30 MED ORDER — ALBUTEROL SULFATE HFA 108 (90 BASE) MCG/ACT IN AERS: 2.0000 | INHALATION_SPRAY | Freq: Four times a day (QID) | RESPIRATORY_TRACT | 2 refills | Status: DC | PRN

## 2023-03-30 NOTE — Assessment & Plan Note (Signed)
Currently on immunotherapy with Keytruda. Her concurrent chemo has been on hold due to cellulitis. She has upcoming appointment Wednesday, 12/11, to discuss plan with oncology. Recent restaging CT with stable right lung mass, improving pulmonary edema, and b/l effusions. See above. Extensive discussion surrounding disease processes today.

## 2023-03-30 NOTE — Assessment & Plan Note (Addendum)
Clinically improved. No residual O2 requirements following discharge. Bilateral pleural effusions with cardiomegaly, evidence of pulmonary edema and BLE edema. She also had a 4 lb weight gain upon admission; back down to 146 lb. She feels her dry weight is 144 lb. Suspect worsening right sided effusion and new left effusion are related to volume overload from HFrEF. She has had issues with labile BP in the past; however, seems to be related to concurrent use of Entresto and metoprolol. She has not had any issues since she was discharged home. Will try to diurese her with lasix 40 mg for 3 days and reassess response with repeat CXR at follow up.  We did discuss that there is a possibility of these being malignant effusions with her stage IV adenocarcinoma of the right lung. If effusions fail to resolve or increase, may need further evaluation.   Patient Instructions  Continue Albuterol inhaler 2 puffs every 6 hours as needed for shortness of breath or wheezing. Notify if symptoms persist despite rescue inhaler/neb use.  Continue Breztri 2 puffs Twice daily. Brush tongue and rinse mouth afterwards  Lasix 40 mg daily for the next 3 days. Take in AM. Ok to wait until after your echo tomorrow. I will reach out to the oncology team about Wednesday. They may want you to hold it and resume Thursday. Monitor your BP in the mornings and if you are <100/60, hold the lasix and let me know. If you have trouble with low blood pressures or dizziness/lightheadedness with the 40 mg, we could try 20 mg instead. Stop and seek emergency care if you were to develop any swelling of the face or tongue.   Hopefully this will help pull some of the fluid off from around your lungs and your leg  Attend appointments with oncology and your heart doctor  Follow up in 2-3 weeks with Dr. Tonia Brooms or [facility] with CXR beforehand. Ok to double book [facility] on day not already double booked. If symptoms do not improve or worsen, please contact office  for sooner follow up or seek emergency care.

## 2023-03-30 NOTE — Progress Notes (Unsigned)
@Patient  ID: Yvette Curry, female    DOB: April 10, 1956, 67 y.o.   MRN: [id number]  Chief Complaint  Patient presents with   Follow-up    Pt is here for SOB F/U visit.    Referring provider: [name], DO  HPI: 67 year old female, active smoker followed for emphysema, stage IV adenocarcinoma of right lung.  She is a patient of Dr. Myrlene Broker and last seen in office 12/18/2022.  Past medical history significant for HFrEF, CAD, CKD  TEST/EVENTS:  10/27/2022 PFT: FVC 65, FEV1 68, ratio 73, TLC 89, DLCOunc 56. No BD 03/26/2023 CT chest: similar small pericardial effusion. Left axillary nodes. Unchanged right apical spiculated nodule, 1.1x 0.9 cm. Emphysema. RLL atelectasis/scarring. Slightly decreased bibasilar interlobular septal thickening, consistent with improving pulm edema. Similar large right and small left pleural effusion  12/18/2022: OV with Dr. Tonia Brooms for consultation due to shortness of breath.  PFT July 2024 with ratio 73, FEV1 69% and decreased diffusing capacity.  Not currently on any inhalers.  Started on [place].  03/30/2023: Today-follow-up Patient presents today for follow-up with her husband.  She has had a relatively complicated course since she was last here.  She started having trouble with the right lower extremity swelling and developed sepsis from cellulitis in October 2024.  She was admitted for 2 days from 10/16-10/18.  Since then she has had 2 additional hospitalizations, most recently 11/24-11/29 for acute respiratory failure and septic shock from suspected cellulitis.  She was treated with fluids, antibiotics and vasopressors.  She had a reaction to one of her antibiotics.  Infectious disease felt as though this was probably related to a cephalosporin allergy then vancomycin.  They transitioned her to Augmentin and continue doxycycline.  Prior to this, she had been seen by Dr. Myra Gianotti, who had recommended laser vein ablation to prevent further infections.  She did also  have evidence of volume overload and acute CHF exacerbation with pulmonary edema and bilateral effusions.  She was diuresed with Lasix 60 mg x 1; however, BP was labile during her stay and per the note, did not have any evidence of volume overload upon discharge so she was not discharged on any diuretics. She tells me today that she is overall feeling much better.  Her right leg has shown significant improvement since her admission.  She has completed her antibiotics.  She did see Dr. Myra Gianotti again 12/5.  Unfortunately, an ablation procedure is no longer able on the table because she has an allergy to lidocaine which is required with this type of procedure.  They did discuss alternative options including vein stripping in the operating room; however, she would have to be off chemo 2 weeks prior and 2 weeks postprocedure.  There was also some concern for wound complications given immunocompromise status.  The current plan is to await her next appoint with oncology, obtain custom compression stockings and then decide what the next best option would be based on her response to this.  Today, she tells me that she is likely leaning towards just doing the compression stockings, which she was able to receive and fit her much better. Regarding her breathing, she feels like she is mostly at her baseline.  She does have a little bit of an increased cough since her hospital stay, which is primarily nonproductive but she will occasionally get up some clear phlegm.  She also has swelling in her left lower leg that has been ongoing for over a month now.  She does  not feel like it is necessarily any worse.  She does feel like the compression stockings are helping some.  She has been tried on a fluid pill in the past but they had difficulties with her blood pressure as she was also on metoprolol and Entresto at the time.  She has not had any issues with low blood pressure since her discharge and has not had to take any of the  midodrine that she is prescribed.  Not noticing any wheezing or chest congestion.  Has not had any fevers, chills, hemoptysis, new/worsening calf pain, orthopnea, palpitations, CP.   She does note that prior to being diagnosed with heart failure and having the difficulties with her legs, her weight was 144 pounds.  Has not seem to be able to to get below 146 pounds even though her appetite has not changed.  When she was first admitted, she was up to 149.6 lb.   Allergies  Allergen Reactions   Lidocaine Shortness Of Breath and Anxiety    Patient felt like she "couldn't breathe", panicky Allergic to all " caines"   Mepivacaine Swelling    angioedema   Demerol Nausea And Vomiting   Prednisone Hives and Nausea And Vomiting    abd pain and vomiting, Hives    Sulfa Antibiotics Hives    Hives, swelling and itching    Immunization History  Administered Date(s) Administered   Fluad Quad(high Dose 65+) 04/08/2022   PNEUMOCOCCAL CONJUGATE-20 06/04/2022    Past Medical History:  Diagnosis Date   Anxiety    no current tx   Depression    no meds at present   Dyspnea    Family history of adverse reaction to anesthesia    pt states mom had allergic reaction to some unknown anesthesia   GERD (gastroesophageal reflux disease)    no tx since weight loss   Hypercholesteremia    Osteoarthritis    stage IV lung ca 11/2021    Tobacco History: Social History   Tobacco Use  Smoking Status Every Day   Current packs/day: 0.50   Average packs/day: 0.5 packs/day for 37.0 years (18.5 ttl pk-yrs)   Types: Cigarettes   Passive exposure: Current  Smokeless Tobacco Never  Tobacco Comments   Smokes half a pack of cigarettes a day. 12/18/2022 [place]   Ready to quit: Not Answered Counseling given: Not Answered Tobacco comments: Smokes half a pack of cigarettes a day. 12/18/2022 Tay   Outpatient Medications Prior to Visit  Medication Sig Dispense Refill   acetaminophen (TYLENOL) 500 MG tablet Take  500 mg by mouth as needed for mild pain (pain score 1-3), moderate pain (pain score 4-6) or fever.     ascorbic acid (VITAMIN C) 500 MG tablet Take 1,000 mg by mouth daily.     Blood Pressure Monitoring (OMRON 3 SERIES BP MONITOR) DEVI Use as directed 1 each 1   carboxymethylcellulose 1 % ophthalmic solution Apply 1-2 drops to eye as needed (dry eye).     ferrous sulfate 324 MG TBEC Take 1 tablet (324 mg total) by mouth daily with breakfast. 30 tablet 4   folic acid (FOLVITE) 1 MG tablet Take 1 tablet (1 mg total) by mouth daily. 90 tablet 1   gabapentin (NEURONTIN) 100 MG capsule Take 2 capsules (200 mg total) by mouth 2 (two) times daily. (Patient taking differently: Take 100 mg by mouth 2 (two) times daily.) 360 capsule 1   lidocaine (LMX) 4 % cream Apply topically 3 (three) times daily  as needed (leg pain). (Patient taking differently: Apply 1 Application topically as needed (leg pain).) 30 g 0   midodrine (PROAMATINE) 5 MG tablet Take 1 tablet (5 mg total) by mouth 3 (three) times daily as needed (If your Systolic Blood Pressure is less than 90). 90 tablet 1   nystatin (MYCOSTATIN) 100000 UNIT/ML suspension Take 5 mLs (500,000 Units total) by mouth 4 (four) times daily for 7 days. Swish and swallow. 140 mL 3   pantoprazole (PROTONIX) 40 MG tablet Take 40 mg by mouth daily.     Probiotic Product (PROBIOTIC PO) Take 1 capsule by mouth daily.     prochlorperazine (COMPAZINE) 10 MG tablet Take 10 mg by mouth as needed for nausea or vomiting. Every 3 weeks before chemo     Budeson-Glycopyrrol-Formoterol (BREZTRI AEROSPHERE) 160-9-4.8 MCG/ACT AERO Inhale 2 puffs into the lungs in the morning and at bedtime. (Patient taking differently: Inhale 2 puffs into the lungs daily.) 10.7 g 3   No facility-administered medications prior to visit.     Review of Systems:   Constitutional: No night sweats, fevers, chills, or lassitude. +fatigue , weight gain HEENT: No headaches, difficulty swallowing,  tooth/dental problems, or sore throat. No sneezing, itching, ear ache, nasal congestion, or post nasal drip CV:  +swelling in lower extremities. No chest pain, orthopnea, PND, anasarca, dizziness, palpitations, syncope Resp: + shortness of breath with exertion; productive cough. No excess mucus or change in color of mucus. No hemoptysis. No wheezing.  No chest wall deformity GI:  No heartburn, indigestion, abdominal pain, nausea, vomiting, diarrhea, change in bowel habits, loss of appetite, bloody stools.  GU: No dysuria, change in color of urine, urgency or frequency.  No flank pain, no hematuria  Skin: No rash, lesions, ulcerations MSK:  No joint pain or swelling.  No decreased range of motion.  No back pain. Neuro: No dizziness or lightheadedness.  Psych: No depression or anxiety. Mood stable.     Physical Exam:  BP 128/84 (BP Location: Left Arm, Cuff Size: Normal)   Pulse 96   Ht 5\' 1"  (1.549 m)   Wt 146 lb 9.6 oz (66.5 kg)   SpO2 96%   BMI 27.70 kg/m   GEN: Pleasant, interactive, well-appearing; in no acute distress HEENT:  Normocephalic and atraumatic. PERRLA. Sclera white. Nasal turbinates pink, moist and patent bilaterally. No rhinorrhea present. Oropharynx pink and moist, without exudate or edema. No lesions, ulcerations, or postnasal drip.  NECK:  Supple w/ fair ROM. No JVD present. Normal carotid impulses w/o bruits. Thyroid symmetrical with no goiter or nodules palpated. No lymphadenopathy.   CV: RRR, no m/r/g. +1 BLE edema. Pulses intact, +2 bilaterally. No cyanosis, pallor or clubbing. PULMONARY:  Unlabored, regular breathing. Diminished bilaterally A&P R>L. No accessory muscle use.  GI: BS present and normoactive. Soft, non-tender to palpation. No organomegaly or masses detected.  MSK: No erythema, warmth or tenderness. Cap refil <2 sec all extrem. No deformities or joint swelling noted.  Neuro: A/Ox3. No focal deficits noted.   Skin: Warm, no lesions or rashes. Erythema  to right lower leg (improving per pt); no exudate Psych: Normal affect and behavior. Judgement and thought content appropriate.     Lab Results:  CBC    Component Value Date/Time   WBC 5.5 04/01/2023 1110   WBC 2.1 (L) 03/20/2023 0510   RBC 2.69 (L) 04/01/2023 1110   HGB 9.8 (L) 04/01/2023 1110   HGB 10.5 (L) 03/24/2023 1444   HCT 29.4 (L) 04/01/2023 1110  HCT 31.8 (L) 03/24/2023 1444   PLT 266 04/01/2023 1110   PLT 103 (L) 03/24/2023 1444   MCV 109.3 (H) 04/01/2023 1110   MCV 107 (H) 03/24/2023 1444   MCH 36.4 (H) 04/01/2023 1110   MCHC 33.3 04/01/2023 1110   RDW 16.1 (H) 04/01/2023 1110   RDW 14.5 03/24/2023 1444   LYMPHSABS 1.2 04/01/2023 1110   LYMPHSABS 1.7 03/24/2023 1444   MONOABS 0.8 04/01/2023 1110   EOSABS 0.1 04/01/2023 1110   EOSABS 0.2 03/24/2023 1444   BASOSABS 0.1 04/01/2023 1110   BASOSABS 0.0 03/24/2023 1444    BMET    Component Value Date/Time   NA 142 04/01/2023 1110   NA 144 03/24/2023 1444   K 4.0 04/01/2023 1110   CL 107 04/01/2023 1110   CO2 30 04/01/2023 1110   GLUCOSE 87 04/01/2023 1110   BUN 23 04/01/2023 1110   BUN 33 (H) 03/24/2023 1444   CREATININE 1.14 (H) 04/01/2023 1110   CREATININE 0.84 11/21/2021 1447   CALCIUM 9.5 04/01/2023 1110   GFRNONAA 53 (L) 04/01/2023 1110    BNP    Component Value Date/Time   BNP 25.0 03/15/2023 1843     Imaging:  ECHOCARDIOGRAM LIMITED Result Date: 03/31/2023    ECHOCARDIOGRAM LIMITED REPORT   Patient Name:   CALLAGHAN ARA Date of Exam: 03/31/2023 Medical Rec #:  [id number]       Height:       61.0 in Accession #:    [id number]      Weight:       146.6 lb Date of Birth:  1955/06/08       BSA:          1.655 m Patient Age:    67 years        BP:           137/83 mmHg Patient Gender: F               HR:           89 bpm. [facility] Procedure: Limited Echo, Cardiac Doppler, Limited Color Doppler and 3D Echo Indications:    CHF  History:        Patient has prior history of  Echocardiogram examinations, most                 recent 06/27/2022. CHF, CAD, Signs/Symptoms:Shortness of Breath                 and Dyspnea; Risk Factors:Dyslipidemia.  Sonographer:    Vern Claude Referring Phys: [id number] ELIZABETH PECK IMPRESSIONS  1. Left ventricular ejection fraction, by estimation, is 55%. The left ventricle has normal function. Left ventricular endocardial border not optimally defined to evaluate regional wall motion. Left ventricular diastolic function could not be evaluated.  2. Right ventricular systolic function is normal. The right ventricular size is normal. Tricuspid regurgitation signal is inadequate for assessing PA pressure.  3. A small circumferential pericardial effusion and moderate pericardial effusion anteriorly is present. There is no evidence of cardiac tamponade.  4. The aortic valve was not well visualized. Aortic valve regurgitation is mild. Aortic regurgitation PHT measures 947 msec.  5. The inferior vena cava is normal in size with greater than 50% respiratory variability, suggesting right atrial pressure of 3 mmHg. Comparison(s): Changes from prior study are noted. LVEF is normal now. Stable mild AR. FINDINGS  Left Ventricle: Left ventricular ejection fraction, by estimation, is 55%. The left ventricle has normal  function. Left ventricular endocardial border not optimally defined to evaluate regional wall motion. The left ventricular internal cavity size was normal in size. There is no left ventricular hypertrophy. Left ventricular diastolic function could not be evaluated. Right Ventricle: The right ventricular size is normal. Right vetricular wall thickness was not well visualized. Right ventricular systolic function is normal. Tricuspid regurgitation signal is inadequate for assessing PA pressure. Pericardium: A small pericardial effusion is present. The pericardial effusion is circumferential. There is no evidence of cardiac tamponade. Presence of epicardial fat  layer. Mitral Valve: The mitral valve is normal in structure. Tricuspid Valve: The tricuspid valve is normal in structure. Aortic Valve: The aortic valve was not well visualized. Aortic valve regurgitation is mild. Aortic regurgitation PHT measures 947 msec. Pulmonic Valve: The pulmonic valve was not well visualized. Venous: The inferior vena cava is normal in size with greater than 50% respiratory variability, suggesting right atrial pressure of 3 mmHg. LEFT VENTRICLE PLAX 2D LVIDd:         4.70 cm LVIDs:         3.30 cm LV PW:         0.70 cm LV IVS:        0.60 cm                             3D Volume EF:                             3D EF:        47 % LV Volumes (MOD)            LV EDV:       111 ml LV vol d, MOD A2C: 124.0 ml LV ESV:       59 ml LV vol d, MOD A4C: 108.0 ml LV SV:        52 ml LV vol s, MOD A2C: 51.7 ml LV SV MOD A2C:     72.3 ml LV SV MOD A4C:     108.0 ml IVC IVC diam: 1.40 cm AORTIC VALVE AI PHT:      947 msec Vishnu Priya Mallipeddi Electronically signed by Winfield Rast Mallipeddi Signature Date/Time: 03/31/2023/2:56:05 PM    Final    CT CHEST ABDOMEN PELVIS W CONTRAST Result Date: 03/27/2023 CLINICAL DATA:  Right upper lobe adenocarcinoma. Shortness of breath. * Tracking Code: BO * EXAM: CT CHEST, ABDOMEN, AND PELVIS WITH CONTRAST TECHNIQUE: Multidetector CT imaging of the chest, abdomen and pelvis was performed following the standard protocol during bolus administration of intravenous contrast. RADIATION DOSE REDUCTION: This exam was performed according to the departmental dose-optimization program which includes automated exposure control, adjustment of the mA and/or kV according to patient size and/or use of iterative reconstruction technique. CONTRAST:  80mL OMNIPAQUE IOHEXOL 300 MG/ML  SOLN COMPARISON:  CT chest, abdomen, and pelvis dated 03/15/2023 FINDINGS: CT CHEST FINDINGS Cardiovascular: Right chest wall port terminates at the superior cavoatrial junction. Normal heart size.  Similar small pericardial effusion. Great vessels are normal in course and caliber. No central pulmonary emboli. Coronary artery calcifications. Mediastinum/Nodes: Imaged thyroid gland without nodules meeting criteria for imaging follow-up by size. Normal esophagus. Unchanged left axillary lymph nodes measuring up to 9 mm (2:17). No additional mediastinal 4 hilar lymphadenopathy. Lungs/Pleura: The central airways are patent. Unchanged right apical spiculated nodule measuring 1.1 x 0.9 cm (7:32). Upper lobe predominant centrilobular  emphysema. Right lower lobe linear atelectasis/scarring. Similar to slightly decreased bibasilar interlobular septal thickening. No pneumothorax. Similar large right and small left pleural effusions. Musculoskeletal: No acute or abnormal lytic or blastic osseous lesions. Multilevel degenerative changes of the thoracic spine. CT ABDOMEN PELVIS FINDINGS Hepatobiliary: No focal hepatic lesions. No intra or extrahepatic biliary ductal dilation. Normal gallbladder. Pancreas: No focal lesions or main ductal dilation. Spleen: Normal in size without focal abnormality. Adrenals/Urinary Tract: No adrenal nodules. No suspicious renal mass, calculi, or hydronephrosis. Underdistended urinary bladder. Stomach/Bowel: Normal appearance of the stomach. No evidence of bowel wall thickening, distention, or inflammatory changes. Colonic diverticulosis without acute diverticulitis. Normal appendix. Vascular/Lymphatic: Aortic atherosclerosis. No enlarged abdominal or pelvic lymph nodes. Reproductive: No adnexal masses. Other: Trace pericholecystic free fluid. No free air or fluid collection. Mild presacral edema. Musculoskeletal: No acute or abnormal lytic or blastic osseous findings. IMPRESSION: 1. Unchanged right apical spiculated nodule measuring 1.1 x 0.9 cm, consistent with known primary lung malignancy. 2. Unchanged left axillary lymph nodes measuring up to 9 mm. 3. Similar large right and small left  pleural effusions with adjacent atelectasis/scarring. 4. Similar to slightly decreased bibasilar interlobular septal thickening, likely pulmonary edema. 5. No evidence of metastatic disease in the abdomen or pelvis. 6. Aortic Atherosclerosis (ICD10-I70.0) and Emphysema (ICD10-J43.9). Coronary artery calcifications. Assessment for potential risk factor modification, dietary therapy or pharmacologic therapy may be warranted, if clinically indicated. Electronically Signed   By: Agustin Cree M.D.   On: 03/27/2023 13:44   DG Chest 2 View Result Date: 03/27/2023 CLINICAL DATA:  Sepsis follow-up. EXAM: CHEST - 2 VIEW COMPARISON:  March 20, 2023. FINDINGS: Stable cardiomediastinal silhouette. Right internal jugular Port-A-Cath is unchanged. Small right pleural effusion is noted which is slightly increased compared to prior exam. Minimal left pleural effusion is noted. Minimal right basilar atelectasis or infiltrate is noted. Bony thorax is unremarkable. IMPRESSION: Minimal right basilar atelectasis or infiltrate is noted with small right pleural effusion which is slightly increased compared to prior exam. Minimal left pleural effusion. Electronically Signed   By: Lupita Raider M.D.   On: 03/27/2023 13:21   DG Chest Port 1 View Result Date: 03/20/2023 CLINICAL DATA:  Cough EXAM: PORTABLE CHEST 1 VIEW COMPARISON:  Chest radiograph dated 03/18/2023 FINDINGS: Lines/tubes: Right chest wall port tip projects over the SVC. Lungs: Well inflated lungs. Minimal bibasilar patchy opacities. Pleura: Decreased blunting of the bilateral costophrenic angles. No pneumothorax. Heart/mediastinum: The heart size and mediastinal contours are within normal limits. Bones: No acute osseous abnormality. IMPRESSION: 1. Minimal bibasilar patchy opacities, likely atelectasis. Aspiration or pneumonia can be considered in the appropriate clinical setting. 2. Decreased blunting of the bilateral costophrenic angles, which may represent decreased  trace pleural effusions. Electronically Signed   By: Agustin Cree M.D.   On: 03/20/2023 12:34   DG CHEST PORT 1 VIEW Result Date: 03/18/2023 CLINICAL DATA:  67 year old female with history of fever and hypoxia. EXAM: PORTABLE CHEST 1 VIEW COMPARISON:  Chest x-ray 03/15/2023. FINDINGS: Right internal jugular single-lumen Port-A-Cath with tip terminating in the distal superior vena cava. Lung volumes are normal. Bibasilar opacities which may reflect areas of atelectasis and/or consolidation with superimposed small bilateral pleural effusions. No pneumothorax. No evidence of pulmonary edema. Heart size is normal. Upper mediastinal contours are within normal limits. Atherosclerotic calcifications are noted in the thoracic aorta. IMPRESSION: 1. Similar appearance of the chest with bibasilar areas of atelectasis and/or consolidation and small bilateral pleural effusions. 2. Aortic atherosclerosis. Electronically Signed  By: Trudie Reed M.D.   On: 03/18/2023 06:22   DG Chest 2 View Result Date: 03/15/2023 CLINICAL DATA:  Suspected sepsis.  History of lung cancer. EXAM: CHEST - 2 VIEW COMPARISON:  Chest radiograph dated 02/12/2023. FINDINGS: The heart size and mediastinal contours are within normal limits. A right internal jugular central venous port catheter tip overlies the superior vena cava. There are small bilateral pleural effusions with associated atelectasis. A right apical pulmonary nodule is better seen on same day chest CT. No pneumothorax. Degenerative changes are seen in the spine. IMPRESSION: Small bilateral pleural effusions with associated atelectasis. Electronically Signed   By: Romona Curls M.D.   On: 03/15/2023 20:17   CT CHEST ABDOMEN PELVIS W CONTRAST Result Date: 03/15/2023 CLINICAL DATA:  Sepsis, lung cancer, on chemotherapy * Tracking Code: BO * EXAM: CT CHEST, ABDOMEN, AND PELVIS WITH CONTRAST TECHNIQUE: Multidetector CT imaging of the chest, abdomen and pelvis was performed  following the standard protocol during bolus administration of intravenous contrast. RADIATION DOSE REDUCTION: This exam was performed according to the departmental dose-optimization program which includes automated exposure control, adjustment of the mA and/or kV according to patient size and/or use of iterative reconstruction technique. CONTRAST:  OMNIPAQUE IOHEXOL 300 MG/ML  SOLN COMPARISON:  CT chest, 01/21/2023, CT abdomen pelvis, 12/10/2022 FINDINGS: CT CHEST FINDINGS Cardiovascular: Right chest port catheter. Aortic atherosclerosis. Normal heart size. Left coronary artery calcifications. Unchanged small pericardial effusion. Mediastinum/Nodes: Unchanged enlarged left axillary lymph nodes measuring up to 1.0 x 0.9 cm (series 2, image 16). No other enlarged mediastinal, hilar, or axillary lymph nodes. Thyroid gland, trachea, and esophagus demonstrate no significant findings. Lungs/Pleura: Small, right-greater-than-left pleural effusions, increased compared to prior examination. Unchanged spiculated nodule of the right pulmonary apex measuring 1.3 x 0.9 cm (series 3, image 39). Mild underlying centrilobular emphysema. New interlobular septal thickening at the lung bases. Musculoskeletal: No chest wall abnormality. No acute osseous findings. CT ABDOMEN PELVIS FINDINGS Hepatobiliary: No solid liver abnormality is seen. No gallstones, gallbladder wall thickening, or biliary dilatation. Pancreas: Unremarkable. No pancreatic ductal dilatation or surrounding inflammatory changes. Spleen: Normal in size without significant abnormality. Adrenals/Urinary Tract: Adrenal glands are unremarkable. Kidneys are normal, without renal calculi, solid lesion, or hydronephrosis. Bladder is unremarkable. Stomach/Bowel: Stomach is within normal limits. Appendix appears normal. No evidence of bowel wall thickening, distention, or inflammatory changes. Sigmoid diverticulosis. Vascular/Lymphatic: Severe aortic atherosclerosis. No  enlarged abdominal or pelvic lymph nodes. Reproductive: No mass or other abnormality. Other: No abdominal wall hernia or abnormality. No ascites. Musculoskeletal: No acute osseous findings. IMPRESSION: 1. Unchanged spiculated nodule of the right pulmonary apex measuring 1.3 x 0.9 cm, consistent with known primary lung malignancy. 2. Unchanged enlarged left axillary lymph nodes. 3. No evidence of lymphadenopathy or metastatic disease in the abdomen or pelvis. 4. Small, right-greater-than-left pleural effusions, increased compared to prior examination. New interlobular septal thickening at the lung bases, consistent with pulmonary edema. 5. Emphysema. 6. Coronary artery disease. Aortic Atherosclerosis (ICD10-I70.0) and Emphysema (ICD10-J43.9). Electronically Signed   By: Jearld Lesch M.D.   On: 03/15/2023 20:12    Bevacizumab (AVASTIN) SOLN 1.25 mg     Date Action Dose Route User   Discharged on 03/20/2023   Admitted on 03/15/2023   02/16/2023 1345 Given 1.25 mg Intravitreal (Right Eye) Rennis Chris, MD      cyanocobalamin (VITAMIN B12) injection 1,000 mcg     Date Action Dose Route User   Discharged on 03/20/2023   Admitted on 03/15/2023  02/18/2023 1201 Given 1,000 mcg Intramuscular (Right Deltoid) Garwin Brothers A, RN      heparin lock flush 100 unit/mL     Date Action Dose Route User   Discharged on 03/20/2023   Admitted on 03/15/2023   02/18/2023 1246 Given 500 Units Intracatheter Garwin Brothers A, RN      heparin lock flush 100 unit/mL     Date Action Dose Route User   Discharged on 03/20/2023   Admitted on 03/15/2023   03/11/2023 1445 Given 500 Units Intracatheter Edger House, RN      heparin lock flush 100 unit/mL     Date Action Dose Route User   04/01/2023 1344 Given 500 Units Intracatheter Rolanda Jay, RN      pembrolizumab [facility]) 200 mg in sodium chloride 0.9 % 50 mL chemo infusion     Date Action Dose Route User   Discharged on 03/20/2023    Admitted on 03/15/2023   02/18/2023 1243 Rate/Dose Change (none) Intravenous Bradd Burner, RN   02/18/2023 1243 Rate/Dose Change (none) Intravenous Bradd Burner, RN   02/18/2023 1213 Rate/Dose Change (none) Intravenous Bradd Burner, RN   02/18/2023 1213 New Bag/Given 200 mg Intravenous Bradd Burner, RN      pembrolizumab (KEYTRUDA) 200 mg in sodium chloride 0.9 % 50 mL chemo infusion     Date Action Dose Route User   Discharged on 03/20/2023   Admitted on 03/15/2023   03/11/2023 1420 Rate/Dose Change (none) Intravenous Edger House, RN   03/11/2023 1420 Rate/Dose Change (none) Intravenous Edger House, RN   03/11/2023 1350 Rate/Dose Change (none) Intravenous Edger House, RN   03/11/2023 1350 New Bag/Given 200 mg Intravenous Leanne Lovely M, RN      pembrolizumab [facility]) 200 mg in sodium chloride 0.9 % 50 mL chemo infusion     Date Action Dose Route User   04/01/2023 1300 Infusion Verify (none) Intravenous Rolanda Jay, RN   04/01/2023 1300 Rate/Dose Change (none) Intravenous Rolanda Jay, RN   04/01/2023 1300 New Bag/Given 200 mg Intravenous Rolanda Jay, RN      PEMEtrexed (ALIMTA) 700 mg in sodium chloride 0.9 % 100 mL chemo infusion     Date Action Dose Route User   Discharged on 03/20/2023   Admitted on 03/15/2023   03/11/2023 1431 Infusion Verify (none) Intravenous Edger House, RN   03/11/2023 1431 Infusion Verify (none) Intravenous Edger House, RN   03/11/2023 1431 New Bag/Given 700 mg Intravenous Leanne Lovely M, RN      0.9 %  sodium chloride infusion     Date Action Dose Route User   Discharged on 03/20/2023   Admitted on 03/15/2023   02/18/2023 1251 Rate/Dose Change (none) Intravenous Bradd Burner, RN   02/18/2023 1248 Rate/Dose Change (none) Intravenous Bradd Burner, RN   02/18/2023 1139 Rate/Dose Change (none) Intravenous Bradd Burner, RN   02/18/2023 1139 New Bag/Given (none) Intravenous Garwin Brothers A, RN       0.9 %  sodium chloride infusion     Date Action Dose Route User   Discharged on 03/20/2023   Admitted on 03/15/2023   03/11/2023 1445 Rate/Dose Change (none) Intravenous Edger House, RN   03/11/2023 1443 Rate/Dose Change (none) Intravenous Edger House, RN   03/11/2023 1432 Rate/Dose Change (none) Intravenous Edger House, RN   03/11/2023 1432 Rate/Dose Change (none) Intravenous Edger House, RN   03/11/2023 1428 Rate/Dose Change (none)  Intravenous Edger House, RN      0.9 %  sodium chloride infusion     Date Action Dose Route User   04/01/2023 1342 Rate/Dose Change (none) Intravenous Rolanda Jay, RN   04/01/2023 1339 Rate/Dose Change (none) Intravenous Rolanda Jay, RN   04/01/2023 1239 Rate/Dose Change (none) Intravenous Rolanda Jay, RN   04/01/2023 1239 New Bag/Given (none) Intravenous Rolanda Jay, RN      sodium chloride flush (NS) 0.9 % injection 10 mL     Date Action Dose Route User   Discharged on 03/20/2023   Admitted on 03/15/2023   02/18/2023 1037 Given 10 mL Intracatheter Tommy Medal, RN      sodium chloride flush (NS) 0.9 % injection 10 mL     Date Action Dose Route User   Discharged on 03/20/2023   Admitted on 03/15/2023   02/18/2023 1246 Given 10 mL Intracatheter Garwin Brothers A, RN      sodium chloride flush (NS) 0.9 % injection 10 mL     Date Action Dose Route User   Discharged on 03/20/2023   Admitted on 03/15/2023   03/11/2023 1110 Given 10 mL Intracatheter Cates, Porsche L, LPN      sodium chloride flush (NS) 0.9 % injection 10 mL     Date Action Dose Route User   Discharged on 03/20/2023   Admitted on 03/15/2023   03/11/2023 1446 Given 10 mL Intracatheter Leanne Lovely M, RN      sodium chloride flush (NS) 0.9 % injection 10 mL     Date Action Dose Route User   04/01/2023 1112 Given 10 mL Intracatheter Wagner, Alissa H, LPN      sodium chloride flush (NS) 0.9 % injection 10 mL     Date  Action Dose Route User   04/01/2023 1344 Given 10 mL Intracatheter Rolanda Jay, RN          Latest Ref Rng & Units 10/27/2022   11:42 AM  PFT Results  FVC-Pre L 1.85   FVC-Predicted Pre % 65   FVC-Post L 2.03   FVC-Predicted Post % 72   Pre FEV1/FVC % % 80   Post FEV1/FCV % % 73   FEV1-Pre L 1.47   FEV1-Predicted Pre % 68   FEV1-Post L 1.49   DLCO uncorrected ml/min/mmHg 9.92   DLCO UNC% % 56   DLVA Predicted % 68   TLC L 4.10   TLC % Predicted % 89   RV % Predicted % 98     No results found for: "NITRICOXIDE"      Assessment & Plan:   Bilateral pleural effusion Clinically improved. No residual O2 requirements following discharge. Bilateral pleural effusions with cardiomegaly, evidence of pulmonary edema and BLE edema. She also had a 4 lb weight gain upon admission; back down to 146 lb. She feels her dry weight is 144 lb. Suspect worsening right sided effusion and new left effusion are related to volume overload from HFrEF. She has had issues with labile BP in the past; however, seems to be related to concurrent use of Entresto and metoprolol. She has not had any issues since she was discharged home. Will try to diurese her with lasix 40 mg for 3 days and reassess response with repeat CXR at follow up.  We did discuss that there is a possibility of these being malignant effusions with her stage IV adenocarcinoma of the right lung. If effusions fail to resolve or increase, may  need further evaluation.   Patient Instructions  Continue Albuterol inhaler 2 puffs every 6 hours as needed for shortness of breath or wheezing. Notify if symptoms persist despite rescue inhaler/neb use.  Continue Breztri 2 puffs Twice daily. Brush tongue and rinse mouth afterwards  Lasix 40 mg daily for the next 3 days. Take in AM. Ok to wait until after your echo tomorrow. I will reach out to the oncology team about Wednesday. They may want you to hold it and resume Thursday. Monitor your BP in  the mornings and if you are <100/60, hold the lasix and let me know. If you have trouble with low blood pressures or dizziness/lightheadedness with the 40 mg, we could try 20 mg instead. Stop and seek emergency care if you were to develop any swelling of the face or tongue.   Hopefully this will help pull some of the fluid off from around your lungs and your leg  Attend appointments with oncology and your heart doctor  Follow up in 2-3 weeks with Dr. Tonia Brooms or [facility] with CXR beforehand. Ok to double book [facility] on day not already double booked. If symptoms do not improve or worsen, please contact office for sooner follow up or seek emergency care.    HFrEF (heart failure with reduced ejection fraction) (HCC) EF 45-50%. See above. Follow up with cardiology as scheduled Monitor weights at home. Notify of weight gain of 2-3 lb overnight or 5 lb in a week.   Adenocarcinoma of right lung, stage 4 (HCC) Currently on immunotherapy with Keytruda. Her concurrent chemo has been on hold due to cellulitis. She has upcoming appointment Wednesday, 12/11, to discuss plan with oncology. Recent restaging CT with stable right lung mass, improving pulmonary edema, and b/l effusions. See above. Extensive discussion surrounding disease processes today.   Emphysema lung (HCC) Stable on current regimen. Does not appear to be in acute exacerbation. Action plan in place.   Cellulitis RLE cellulitis. Improving. No evidence of infection in LLE. Follow up with vascular as scheduled.    Advised if symptoms do not improve or worsen, to please contact office for sooner follow up or seek emergency care.   I spent 60 minutes of dedicated to the care of this patient on the date of this encounter to include pre-visit review of records, face-to-face time with the patient discussing conditions above, post visit ordering of testing, clinical documentation with the electronic health record, making appropriate referrals as documented, and  communicating necessary findings to members of the patients care team.  Noemi Chapel, NP 04/02/2023  Pt aware and understands NP's role.

## 2023-03-30 NOTE — Patient Instructions (Addendum)
Continue Albuterol inhaler 2 puffs every 6 hours as needed for shortness of breath or wheezing. Notify if symptoms persist despite rescue inhaler/neb use.  Continue Breztri 2 puffs Twice daily. Brush tongue and rinse mouth afterwards  Lasix 40 mg daily for the next 3 days. Take in AM. Ok to wait until after your echo tomorrow. I will reach out to the oncology team about Wednesday. They may want you to hold it and resume Thursday. Monitor your BP in the mornings and if you are <100/60, hold the lasix and let me know. If you have trouble with low blood pressures or dizziness/lightheadedness with the 40 mg, we could try 20 mg instead. Stop and seek emergency care if you were to develop any swelling of the face or tongue.   Hopefully this will help pull some of the fluid off from around your lungs and your leg  Attend appointments with oncology and your heart doctor  Follow up in 2-3 weeks with Dr. Tonia Brooms or [facility] with CXR beforehand. Ok to double book [facility] on day not already double booked. If symptoms do not improve or worsen, please contact office for sooner follow up or seek emergency care.

## 2023-03-30 NOTE — Assessment & Plan Note (Signed)
Stable on current regimen. Does not appear to be in acute exacerbation. Action plan in place.

## 2023-03-30 NOTE — Assessment & Plan Note (Signed)
EF 45-50%. See above. Follow up with cardiology as scheduled Monitor weights at home. Notify of weight gain of 2-3 lb overnight or 5 lb in a week.

## 2023-03-30 NOTE — Assessment & Plan Note (Addendum)
RLE cellulitis. Improving. No evidence of infection in LLE. Follow up with vascular as scheduled.

## 2023-03-31 ENCOUNTER — Telehealth: Payer: Self-pay | Admitting: Student

## 2023-03-31 ENCOUNTER — Ambulatory Visit (HOSPITAL_COMMUNITY)
Admission: RE | Admit: 2023-03-31 | Discharge: 2023-03-31 | Disposition: A | Discharge status: Home / Self Care | Payer: Medicare Other | Source: Ambulatory Visit | Attending: Nurse Practitioner | Admitting: Nurse Practitioner

## 2023-03-31 DIAGNOSIS — I5022 Chronic systolic (congestive) heart failure: Secondary | ICD-10-CM | POA: Diagnosis present

## 2023-03-31 LAB — ECHOCARDIOGRAM LIMITED
Est EF: 55
P 1/2 time: 947 ms
S' Lateral: 3.3 cm
Single Plane A2C EF: 58.3 %

## 2023-03-31 NOTE — Telephone Encounter (Signed)
Pt called and was told to take Lasix if her BP was over 100 over 60 not to take it.  Yesterday it was 100 over 66  Today it is 105 over 68  Please call to advise what Yvette Curry would like her to do. As of now she has not taken any Lasix.   AVS:  Lasix 40 mg daily for the next 3 days. Take in AM. Ok to wait until after your echo tomorrow. I will reach out to the oncology team about Wednesday. They may want you to hold it and resume Thursday. Monitor your BP in the mornings and if you are <100/60, hold the lasix and let me know. If you have trouble with low blood pressures or dizziness/lightheadedness with the 40 mg, we could try 20 mg instead. Stop and seek emergency care if you were to develop any swelling of the face or tongue.

## 2023-03-31 NOTE — Telephone Encounter (Signed)
She should go ahead and take the dose today. Monitor for any dizziness/lightheadedness with this. Recheck BP tomorrow. If she is above 100/60, ok to take again. Thanks.

## 2023-03-31 NOTE — Telephone Encounter (Signed)
Called and spoke with pt about lasix, she states it was 134/74 and she took it earlier. She verbalized understanding about taking lasix nfn

## 2023-03-31 NOTE — Progress Notes (Unsigned)
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 03/31/23  ADDENDUM: Hematology/Oncology Attending: I had a face-to-face encounter with the patient today.  I reviewed her record, lab, scan and recommended her care plan.  This is a very pleasant 67 years old white female with a stage IV non-small cell lung cancer, adenocarcinoma diagnosed in August 2023 with no actionable mutations.  She is status post systemic chemotherapy initially with carboplatin, Alimta and Keytruda for 4 cycles and she has been on maintenance treatment with Alimta at a reduced dose in addition to [facility] for 18 more cycles.  Patient also underwent SBRT to enlarging right upper lobe pulmonary nodule. She has been tolerating her treatment well but she had few hospitalizations recently for cellulitis and sepsis mainly cellulitis of the lower extremities with prominent vascular issues in the lower extremity and venous insufficiency.  She has been by vascular surgeon in the past and they recommended conservative  measures at this point. She had repeat CT scan of the chest, abdomen and pelvis performed recently.  I personally and independently reviewed the scan and discussed the result with the patient today. Her scan showed no concerning findings for disease progression. I recommended for her to continue her current treatment but I will hold her treatment with Alimta for the next few cycles because of the frequent hospitalization recently with cellulitis and sepsis.  She will be treated with single agent Keytruda every 3 weeks for the next 3 cycles before reevaluation with imaging studies.  If she develop any disease progression on single agent Keytruda, I may consider adding Alimta again to her regimen. The patient and her husband are in agreement with the current plan. She was advised to call immediately if she has any other concerning symptoms in the interval. The total time spent in the appointment was 30 minutes. Disclaimer: This note was dictated with voice recognition software. Similar sounding words can inadvertently be transcribed and may be missed upon review.  Lajuana Matte, MD

## 2023-03-31 NOTE — Telephone Encounter (Signed)
See above

## 2023-03-31 NOTE — Progress Notes (Signed)
  Echocardiogram 2D Echocardiogram has been performed.  Ocie Doyne RDCS 03/31/2023, 11:24 AM

## 2023-04-01 ENCOUNTER — Inpatient Hospital Stay (HOSPITAL_BASED_OUTPATIENT_CLINIC_OR_DEPARTMENT_OTHER): Payer: Medicare Other | Admitting: Physician Assistant

## 2023-04-01 ENCOUNTER — Inpatient Hospital Stay: Payer: Medicare Other | Attending: Internal Medicine

## 2023-04-01 ENCOUNTER — Inpatient Hospital Stay: Payer: Medicare Other

## 2023-04-01 ENCOUNTER — Encounter: Payer: Self-pay | Admitting: *Deleted

## 2023-04-01 ENCOUNTER — Inpatient Hospital Stay (HOSPITAL_BASED_OUTPATIENT_CLINIC_OR_DEPARTMENT_OTHER): Payer: Medicare Other | Admitting: Nurse Practitioner

## 2023-04-01 VITALS — BP 133/85 | HR 98 | Temp 97.6°F | Resp 17 | Wt 143.1 lb

## 2023-04-01 DIAGNOSIS — K5900 Constipation, unspecified: Secondary | ICD-10-CM | POA: Diagnosis not present

## 2023-04-01 DIAGNOSIS — F17210 Nicotine dependence, cigarettes, uncomplicated: Secondary | ICD-10-CM | POA: Insufficient documentation

## 2023-04-01 DIAGNOSIS — M792 Neuralgia and neuritis, unspecified: Secondary | ICD-10-CM | POA: Diagnosis not present

## 2023-04-01 DIAGNOSIS — C3491 Malignant neoplasm of unspecified part of right bronchus or lung: Secondary | ICD-10-CM | POA: Diagnosis not present

## 2023-04-01 DIAGNOSIS — Z5112 Encounter for antineoplastic immunotherapy: Secondary | ICD-10-CM | POA: Diagnosis present

## 2023-04-01 DIAGNOSIS — T451X5A Adverse effect of antineoplastic and immunosuppressive drugs, initial encounter: Secondary | ICD-10-CM

## 2023-04-01 DIAGNOSIS — C778 Secondary and unspecified malignant neoplasm of lymph nodes of multiple regions: Secondary | ICD-10-CM | POA: Diagnosis not present

## 2023-04-01 DIAGNOSIS — Z515 Encounter for palliative care: Secondary | ICD-10-CM

## 2023-04-01 DIAGNOSIS — D649 Anemia, unspecified: Secondary | ICD-10-CM | POA: Insufficient documentation

## 2023-04-01 DIAGNOSIS — C3411 Malignant neoplasm of upper lobe, right bronchus or lung: Secondary | ICD-10-CM | POA: Insufficient documentation

## 2023-04-01 DIAGNOSIS — I3139 Other pericardial effusion (noninflammatory): Secondary | ICD-10-CM | POA: Diagnosis not present

## 2023-04-01 DIAGNOSIS — R530 Neoplastic (malignant) related fatigue: Secondary | ICD-10-CM

## 2023-04-01 DIAGNOSIS — Z9221 Personal history of antineoplastic chemotherapy: Secondary | ICD-10-CM | POA: Diagnosis not present

## 2023-04-01 DIAGNOSIS — Z79899 Other long term (current) drug therapy: Secondary | ICD-10-CM | POA: Diagnosis not present

## 2023-04-01 LAB — CMP (CANCER CENTER ONLY)
ALT: 15 U/L (ref 0–44)
AST: 24 U/L (ref 15–41)
Albumin: 3.5 g/dL (ref 3.5–5.0)
Alkaline Phosphatase: 57 U/L (ref 38–126)
Anion gap: 5 (ref 5–15)
BUN: 23 mg/dL (ref 8–23)
CO2: 30 mmol/L (ref 22–32)
Calcium: 9.5 mg/dL (ref 8.9–10.3)
Chloride: 107 mmol/L (ref 98–111)
Creatinine: 1.14 mg/dL — ABNORMAL HIGH (ref 0.44–1.00)
GFR, Estimated: 53 mL/min — ABNORMAL LOW (ref >60)
Glucose, Bld: 87 mg/dL (ref 70–99)
Potassium: 4 mmol/L (ref 3.5–5.1)
Sodium: 142 mmol/L (ref 135–145)
Total Bilirubin: 0.3 mg/dL (ref <1.2)
Total Protein: 6.9 g/dL (ref 6.5–8.1)

## 2023-04-01 LAB — CBC WITH DIFFERENTIAL (CANCER CENTER ONLY)
Abs Immature Granulocytes: 0.03 10*3/uL (ref 0.00–0.07)
Basophils Absolute: 0.1 10*3/uL (ref 0.0–0.1)
Basophils Relative: 1 %
Eosinophils Absolute: 0.1 10*3/uL (ref 0.0–0.5)
Eosinophils Relative: 2 %
HCT: 29.4 % — ABNORMAL LOW (ref 36.0–46.0)
Hemoglobin: 9.8 g/dL — ABNORMAL LOW (ref 12.0–15.0)
Immature Granulocytes: 1 %
Lymphocytes Relative: 22 %
Lymphs Abs: 1.2 10*3/uL (ref 0.7–4.0)
MCH: 36.4 pg — ABNORMAL HIGH (ref 26.0–34.0)
MCHC: 33.3 g/dL (ref 30.0–36.0)
MCV: 109.3 fL — ABNORMAL HIGH (ref 80.0–100.0)
Monocytes Absolute: 0.8 10*3/uL (ref 0.1–1.0)
Monocytes Relative: 15 %
Neutro Abs: 3.3 10*3/uL (ref 1.7–7.7)
Neutrophils Relative %: 59 %
Platelet Count: 266 10*3/uL (ref 150–400)
RBC: 2.69 MIL/uL — ABNORMAL LOW (ref 3.87–5.11)
RDW: 16.1 % — ABNORMAL HIGH (ref 11.5–15.5)
WBC Count: 5.5 10*3/uL (ref 4.0–10.5)
nRBC: 0 % (ref 0.0–0.2)

## 2023-04-01 MED ORDER — SODIUM CHLORIDE 0.9 % IV SOLN: Freq: Once | INTRAVENOUS | Status: AC

## 2023-04-01 MED ORDER — SODIUM CHLORIDE 0.9% FLUSH
10.0000 mL | Freq: Once | INTRAVENOUS | Status: AC
Start: 2023-04-01 — End: 2023-04-01
  Administered 2023-04-01: 10 mL

## 2023-04-01 MED ORDER — HEPARIN SOD (PORK) LOCK FLUSH 100 UNIT/ML IV SOLN
500.0000 [IU] | Freq: Once | INTRAVENOUS | Status: AC | PRN
Start: 2023-04-01 — End: 2023-04-01
  Administered 2023-04-01: 500 [IU]

## 2023-04-01 MED ORDER — PEG 3350-KCL-NA BICARB-NACL 420 G PO SOLR: 8000.0000 mL | ORAL | 0 refills | Status: DC

## 2023-04-01 MED ORDER — SODIUM CHLORIDE 0.9 % IV SOLN
200.0000 mg | Freq: Once | INTRAVENOUS | Status: AC
  Administered 2023-04-01: 200 mg via INTRAVENOUS
  Filled 2023-04-01: qty 200

## 2023-04-01 MED ORDER — SODIUM CHLORIDE 0.9% FLUSH
10.0000 mL | INTRAVENOUS | Status: DC | PRN
  Administered 2023-04-01: 10 mL

## 2023-04-01 NOTE — Progress Notes (Signed)
Palliative Medicine [facility]  [phone] [phone]   Name: Yvette Curry Date: 04/01/2023 MRN: [id number]  DOB: 1955-12-08  Patient Care Team: [name], DO as PCP - General (Family Medicine) Mallipeddi, Orion Modest, MD as PCP - Cardiology (Cardiology) Crista Elliot, MD as Consulting Physician (Urology) Pickenpack-Cousar, Arty Baumgartner, NP as Nurse Practitioner (Hospice and Palliative Medicine)    INTERVAL HISTORY: Yvette Curry is a 67 y.o. 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 Palliative ask to see for symptom management and goals of care.   SOCIAL HISTORY:     reports that she has been smoking cigarettes. She has a 18.5 pack-year smoking history. She has been exposed to tobacco smoke. She has never used smokeless tobacco. She reports that she does not drink alcohol and does not use drugs.  ADVANCE DIRECTIVES:  None on file  CODE STATUS: Full code  PAST MEDICAL HISTORY: Past Medical History:  Diagnosis Date   Anxiety    no current tx   Depression    no meds at present   Dyspnea    Family history of adverse reaction to anesthesia    pt states mom had allergic reaction to some unknown anesthesia   GERD (gastroesophageal reflux disease)    no tx since weight loss   Hypercholesteremia    Osteoarthritis    stage IV lung ca 11/2021    ALLERGIES:  is allergic to lidocaine, mepivacaine, demerol, prednisone, and sulfa antibiotics.  MEDICATIONS:  Current Outpatient Medications  Medication Sig Dispense Refill   acetaminophen (TYLENOL) 500 MG tablet Take 500 mg by mouth as needed for mild pain (pain score 1-3), moderate pain (pain score 4-6) or fever.     albuterol (VENTOLIN HFA) 108 (90 Base) MCG/ACT inhaler Inhale 2 puffs into the  lungs every 6 (six) hours as needed for wheezing or shortness of breath. 8 g 2   ascorbic acid (VITAMIN C) 500 MG tablet Take 1,000 mg by mouth daily.     Blood Pressure Monitoring (OMRON 3 SERIES BP MONITOR) DEVI Use as directed 1 each 1   Budeson-Glycopyrrol-Formoterol (BREZTRI AEROSPHERE) 160-9-4.8 MCG/ACT AERO Inhale 2 puffs into the lungs in the morning and at bedtime. 10.7 g 3   carboxymethylcellulose 1 % ophthalmic solution Apply 1-2 drops to eye as needed (dry eye).     ferrous sulfate 324 MG TBEC Take 1 tablet (324 mg total) by mouth daily with breakfast. 30 tablet 4   folic acid (FOLVITE) 1 MG tablet Take 1 tablet (1 mg total) by mouth daily. 90 tablet 1   furosemide (LASIX) 40 MG tablet Take 1 tablet (40 mg total) by mouth daily for 3 days. 3 tablet 0   gabapentin (NEURONTIN) 100 MG capsule Take 2 capsules (200 mg total) by mouth 2 (two) times daily. (Patient taking differently: Take 100 mg by mouth 2 (two) times daily.) 360 capsule 1   lidocaine (LMX) 4 % cream Apply topically 3 (three) times daily as needed (leg pain). (Patient taking differently: Apply 1 Application topically as needed (leg pain).) 30 g 0   midodrine (PROAMATINE) 5 MG tablet Take 1 tablet (5 mg total) by mouth 3 (three) times daily as needed (If your Systolic Blood Pressure is less than 90). 90 tablet 1   nystatin (MYCOSTATIN) 100000 UNIT/ML suspension Take 5 mLs (  500,000 Units total) by mouth 4 (four) times daily for 7 days. Swish and swallow. 140 mL 3   pantoprazole (PROTONIX) 40 MG tablet Take 40 mg by mouth daily.     Probiotic Product (PROBIOTIC PO) Take 1 capsule by mouth daily.     prochlorperazine (COMPAZINE) 10 MG tablet Take 10 mg by mouth as needed for nausea or vomiting. Every 3 weeks before chemo     No current facility-administered medications for this visit.    VITAL SIGNS: There were no vitals taken for this visit. There were no vitals filed for this visit.  Estimated body mass index is 27.7 kg/m  as calculated from the following:   Height as of 03/30/23: 5\' 1"  (1.549 m).   Weight as of 03/30/23: 146 lb 9.6 oz (66.5 kg).   PERFORMANCE STATUS (ECOG) : 1 - Symptomatic but completely ambulatory   Physical Exam General: NAD Cardiovascular: regular rate and rhythm Pulmonary: normal breathing pattern  Extremities: left lower edema, no joint deformities Skin: no rashes Neurological: AAO x3  Discussed the use of AI scribe software for clinical note transcription with the patient, who gave verbal consent to proceed.  IMPRESSION:  Mrs. Albertini presents to clinic today for follow-up. No acute distress. Her husband is present. Patient recently hospitalized due to Sepsis requiring ICU stay.  She reports a significant loss of strength following the hospitalization, which she has been working to regain. She also reports a recent decision with Oncologist to hold chemotherapy due to concerns about side effects, including fluid behind the retina and recurrent cellulitis/infections. Despite recent health challenges thankfully she and her husband recently celebrated several memorable milestones in their life: 40 years of marriage, 41 years of relationship/first date.   The patient's appetite has improved recently, with an ability to finish meals without needing to take leftovers home. Snacking in between meals. Enjoying [employer]. She reports daily bowel movements, though not of the usual consistency. She denies straining or discomfort during bowel movements. She also denies nausea, vomiting, headaches, and dizziness, though she does report occasional lightheadedness when lying down or standing up. She knows to continue to closely monitor and report any significant changes or seek medical attention immediately.   Mrs. Lubow has been managing her neuropathic pain with gabapentin, taken twice daily. She reports that the pain has improved to the point where she can tolerate having lotion applied to  her legs by her husband. She also reports a recent sprain to her ankle, which has been causing intermittent pain and swelling. She is scheduled to follow up with an orthopedic surgeon for this issue. Some scaly dryness to her right leg affected by cellulitis. She is wearing compression socks.   The patient's spouse is heavily involved in her care and has been encouraging her. The patient acknowledges the importance of his role and support throughout this journey.  Patient and husband knows I am available as needed for support.  I discussed the importance of continued conversation with family and their medical providers regarding overall plan of care and treatment options, ensuring decisions are within the context of the patients values and GOCs.  PLAN: Assessment & Plan Cancer Treatment Recent hospitalization for sepsis secondary to cellulitis. Chemotherapy has been held for the next three rounds due to concerns of it causing infectious reactions and other side effects per Oncology. Patient is currently on Keytruda treatment. -Continue Keytruda treatment as planned per Dr. Arbutus Ped.   Hypotension Recent episode of hypotension in the setting  of sepsis causing lightheadedness. Patient was advised to seek immediate medical attention if this occurs again. -Advise patient to seek immediate medical attention if blood pressure drops significantly or if lightheadedness occurs.  Constipation Patient reports daily bowel movements, but not of the usual consistency. Currently taking Colace and has Miralax and Milk of Magnesia available if needed. -Continue current bowel regimen as needed.  Neuropathy Patient reports improvement in neuropathic pain and is currently taking Gabapentin 100mg  twice daily. -Continue Gabapentin 100mg  twice daily. May increase to three times daily if tolerated.   Ankle Sprain Patient reports intermittent pain and swelling in the ankle. She has a follow-up appointment with the  orthopedic surgeon. -Advise patient to continue icing and elevating the ankle as needed. Follow up with orthopedic surgeon as planned.  General Health Maintenance / Followup Plans -Continue to monitor appetite and encourage nutritional intake. -Encourage patient to maintain hydration, especially while on Lasix. -Check in with patient to ensure things are going well. - Patient knows to contact office as needed. Patient expressed understanding and was in agreement with this plan. She also understands that She can call the clinic at any time with any questions, concerns, or complaints.   Any controlled substances utilized were prescribed in the context of palliative care. PDMP has been reviewed.    Visit consisted of counseling and education dealing with the complex and emotionally intense issues of symptom management and palliative care in the setting of serious and potentially life-threatening illness.  Willette Alma, AGPCNP-BC  Palliative Medicine Team/[facility]

## 2023-04-01 NOTE — Addendum Note (Signed)
Addended by: Armstead Peaks on: 04/01/2023 10:50 AM   Modules accepted: Orders

## 2023-04-01 NOTE — Telephone Encounter (Signed)
Called pt to schedule TCS with possible EGD with Dr. Levon Hedger, ASA 3 on 05/05/23. Aware will send instructions to her and pre-op. Rx to be sent to [facility]. Aware she is getting extended prep per Kristen's orders.

## 2023-04-01 NOTE — Patient Instructions (Signed)

## 2023-04-02 ENCOUNTER — Encounter: Payer: Self-pay | Admitting: Nurse Practitioner

## 2023-04-02 ENCOUNTER — Encounter: Payer: Self-pay | Admitting: Physician Assistant

## 2023-04-02 ENCOUNTER — Encounter: Payer: Self-pay | Admitting: Internal Medicine

## 2023-04-07 ENCOUNTER — Ambulatory Visit: Payer: Medicare Other | Attending: Internal Medicine | Admitting: Internal Medicine

## 2023-04-07 VITALS — BP 122/84 | HR 91 | Ht 61.0 in | Wt 144.6 lb

## 2023-04-07 DIAGNOSIS — I3139 Other pericardial effusion (noninflammatory): Secondary | ICD-10-CM | POA: Diagnosis not present

## 2023-04-07 DIAGNOSIS — I959 Hypotension, unspecified: Secondary | ICD-10-CM | POA: Diagnosis not present

## 2023-04-07 NOTE — Patient Instructions (Addendum)

## 2023-04-08 ENCOUNTER — Ambulatory Visit (INDEPENDENT_AMBULATORY_CARE_PROVIDER_SITE_OTHER): Payer: Medicare Other | Admitting: Ophthalmology

## 2023-04-08 ENCOUNTER — Encounter (INDEPENDENT_AMBULATORY_CARE_PROVIDER_SITE_OTHER): Payer: Self-pay | Admitting: Ophthalmology

## 2023-04-08 DIAGNOSIS — H35711 Central serous chorioretinopathy, right eye: Secondary | ICD-10-CM | POA: Diagnosis not present

## 2023-04-08 DIAGNOSIS — C3491 Malignant neoplasm of unspecified part of right bronchus or lung: Secondary | ICD-10-CM

## 2023-04-08 DIAGNOSIS — H04123 Dry eye syndrome of bilateral lacrimal glands: Secondary | ICD-10-CM | POA: Diagnosis not present

## 2023-04-08 DIAGNOSIS — H353211 Exudative age-related macular degeneration, right eye, with active choroidal neovascularization: Secondary | ICD-10-CM

## 2023-04-08 DIAGNOSIS — Z961 Presence of intraocular lens: Secondary | ICD-10-CM

## 2023-04-08 MED ORDER — BEVACIZUMAB CHEMO INJECTION 1.25MG/0.05ML SYRINGE FOR KALEIDOSCOPE
1.2500 mg | INTRAVITREAL | Status: AC | PRN
  Administered 2023-04-08: 1.25 mg via INTRAVITREAL
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
## 2023-04-10 DIAGNOSIS — I3139 Other pericardial effusion (noninflammatory): Secondary | ICD-10-CM | POA: Insufficient documentation

## 2023-04-10 DIAGNOSIS — I959 Hypotension, unspecified: Secondary | ICD-10-CM | POA: Insufficient documentation
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
## 2023-04-21 ENCOUNTER — Encounter: Payer: Self-pay | Admitting: Nurse Practitioner

## 2023-04-21 ENCOUNTER — Ambulatory Visit (INDEPENDENT_AMBULATORY_CARE_PROVIDER_SITE_OTHER): Payer: Medicare Other | Admitting: Nurse Practitioner

## 2023-04-21 ENCOUNTER — Telehealth: Payer: Self-pay | Admitting: *Deleted

## 2023-04-21 ENCOUNTER — Ambulatory Visit (INDEPENDENT_AMBULATORY_CARE_PROVIDER_SITE_OTHER): Payer: Medicare Other

## 2023-04-21 VITALS — BP 122/88 | HR 94

## 2023-04-21 DIAGNOSIS — C3491 Malignant neoplasm of unspecified part of right bronchus or lung: Secondary | ICD-10-CM

## 2023-04-21 DIAGNOSIS — J439 Emphysema, unspecified: Secondary | ICD-10-CM | POA: Diagnosis not present

## 2023-04-21 DIAGNOSIS — I5032 Chronic diastolic (congestive) heart failure: Secondary | ICD-10-CM

## 2023-04-21 DIAGNOSIS — J90 Pleural effusion, not elsewhere classified: Secondary | ICD-10-CM

## 2023-04-21 DIAGNOSIS — I959 Hypotension, unspecified: Secondary | ICD-10-CM

## 2023-04-21 NOTE — Progress Notes (Signed)
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 Recent CXR showed some small right pleural effusion, stable or slightly smaller since prior Study  Lower Extremity Edema Persistent swelling in legs, worse in the right leg. No signs of deep vein thrombosis (DVT) on recent ultrasound. -Continue Lasix  as prescribed. -Elevate legs and compression stockings.  -We discussed repeat DVT study since she had improvement in her right leg but not her left. She is interested in this. Will place order and see if this can be performed at [facility].   Dental Health Upcoming root canal procedure. No contraindications with current cancer treatment  with immunotherapy. -Advise dentist that antibiotics or other medications as needed for procedure are acceptable.  Kidney Function Stable creatinine levels, improved from previous readings. -Encourage hydration and maintenance of normal blood pressure to support kidney health.  Follow-up -Order CT scan at next visit. -Schedule follow-up appointment for May 14, 2023   Anemia Mild anemia noted. -Monitor  hemoglobin levels. Improving from today's labs.    Pericardial Effusion Noted on recent echocardiogram. -Continue monitoring by cardiology.   Constipation Managed with daily Colace as needed -Continue current management.   Edema Noted in feet. -Monitor and manage as per cardiology, PCP, and pulmonology recommendations. -She is monitoring her daily weights at home.   The patient was advised to call immediately if she has any concerning symptoms in the interval. The patient voices understanding of current disease status and treatment options and is in agreement with the current care plan. All questions were answered. The patient knows to call the clinic with any problems, questions or concerns. We can certainly see the patient much sooner if necessary    No orders of the defined types were placed in this encounter.    The total time spent in the appointment was 30-39 minutes  Toma Erichsen L Lastacia Solum, PA-C 04/23/23

## 2023-04-21 NOTE — Patient Instructions (Addendum)
 Continue Albuterol  inhaler 2 puffs every 6 hours as needed for shortness of breath or wheezing. Notify if symptoms persist despite rescue inhaler/neb use.  Continue Breztri  2 puffs Twice daily. Brush tongue and rinse mouth afterwards   Monitor weights at home and notify cardiology of weight gain 2-3 lb overnight or 5 lb in a week    Repeat chest x ray today   Glad you are feeling better!   Follow up in 3 months with Dr. Brenna or Izetta Malachy PIETY. If symptoms do not improve or worsen, please contact office for sooner follow up or seek emergency care.

## 2023-04-21 NOTE — Telephone Encounter (Unsigned)
 Copied from CRM [id number]. Topic: Clinical - Medical Advice >> Apr 21, 2023  8:36 AM Powell B wrote: Reason for CRM: Patient spouse Emil calling because patient has several follow up appointments scheduled in January. Would like to speak to PCP or care team about what is needed for patient. [phone]

## 2023-04-21 NOTE — Telephone Encounter (Signed)
 Pt called and states she is concerned about the trilyte  prep. She says the pharmacist told her that it could cause a drop in her B/P and HR and cause dehydration. She has Midodrine  in case her systolic blood pressure drops to 90. She says she will be keeping an eye on her blood pressure but she is concerned because she is doing 1/2 day extra bowel prep. Please advise. Thank you

## 2023-04-21 NOTE — Progress Notes (Signed)
 @Patient  ID: Yvette Curry, female    DOB: Jun 07, 1955, 67 y.o.   MRN: [id number]  Chief Complaint  Patient presents with   Follow-up    Pt denies any concerns, does have questions about meds before COLONOSCOPY     Referring provider: Cook, [name], DO  HPI: 67 year old female, active smoker followed for emphysema, stage IV adenocarcinoma of right lung.  She is a patient of Dr. Harriet and last seen in office 03/30/2023 by [facility] NP.  Past medical history significant for HFrEF, CAD, CKD  TEST/EVENTS:  10/27/2022 PFT: FVC 65, FEV1 68, ratio 73, TLC 89, DLCOunc 56. No BD 03/26/2023 CT chest: similar small pericardial effusion. Left axillary nodes. Unchanged right apical spiculated nodule, 1.1x 0.9 cm. Emphysema. RLL atelectasis/scarring. Slightly decreased bibasilar interlobular septal thickening, consistent with improving pulm edema. Similar large right and small left pleural effusion  12/18/2022: OV with Dr. Brenna for consultation due to shortness of breath.  PFT July 2024 with ratio 73, FEV1 69% and decreased diffusing capacity.  Not currently on any inhalers.  Started on Breztri .  03/30/2023: OV with Arantxa Piercey NP for follow-up with her husband.  She has had a relatively complicated course since she was last here.  She started having trouble with the right lower extremity swelling and developed sepsis from cellulitis in October 2024.  She was admitted for 2 days from 10/16-10/18.  Since then she has had 2 additional hospitalizations, most recently 11/24-11/29 for acute respiratory failure and septic shock from suspected cellulitis.  She was treated with fluids, antibiotics and vasopressors.  She had a reaction to one of her antibiotics.  Infectious disease felt as though this was probably related to a cephalosporin allergy then vancomycin .  They transitioned her to Augmentin  and continue doxycycline .  Prior to this, she had been seen by Dr. Serene, who had recommended laser vein ablation to prevent  further infections.  She did also have evidence of volume overload and acute CHF exacerbation with pulmonary edema and bilateral effusions.  She was diuresed with Lasix  60 mg x 1; however, BP was labile during her stay and per the note, did not have any evidence of volume overload upon discharge so she was not discharged on any diuretics. She tells me today that she is overall feeling much better.  Her right leg has shown significant improvement since her admission.  She has completed her antibiotics.  She did see Dr. Serene again 12/5.  Unfortunately, an ablation procedure is no longer able on the table because she has an allergy to lidocaine  which is required with this type of procedure.  They did discuss alternative options including vein stripping in the operating room; however, she would have to be off chemo 2 weeks prior and 2 weeks postprocedure.  There was also some concern for wound complications given immunocompromise status.  The current plan is to await her next appoint with oncology, obtain custom compression stockings and then decide what the next best option would be based on her response to this.  Today, she tells me that she is likely leaning towards just doing the compression stockings, which she was able to receive and fit her much better. Regarding her breathing, she feels like she is mostly at her baseline.  She does have a little bit of an increased cough since her hospital stay, which is primarily nonproductive but she will occasionally get up some clear phlegm.  She also has swelling in her left lower leg that has been ongoing  for over a month now.  She does not feel like it is necessarily any worse.  She does feel like the compression stockings are helping some.  She has been tried on a fluid pill in the past but they had difficulties with her blood pressure as she was also on metoprolol  and Entresto at the time.  She has not had any issues with low blood pressure since her discharge and  has not had to take any of the midodrine  that she is prescribed.  Not noticing any wheezing or chest congestion.  Has not had any fevers, chills, hemoptysis, new/worsening calf pain, orthopnea, palpitations, CP.   She does note that prior to being diagnosed with heart failure and having the difficulties with her legs, her weight was 144 pounds.  Has not seem to be able to to get below 146 pounds even though her appetite has not changed.  When she was first admitted, she was up to 149.6 lb.   04/21/2023: Today - follow up Patient presents today for follow up with her husband. At her last visit, we treated her with lasix  for volume overload. She tolerated this well. Her weight has decreased to 144 lb, which is her baseline. She feels like she is breathing better. She doesn't have much of a cough. Her swelling in the right leg is resolved. She still has some in the left leg, which has been constant since her ankle injury in November. She does feel like the swelling went down with the lasix . She has a follow up with ortho coming up. She had a recent echo which showed improvement in her EF to normal. She is using Breztri  twice a day. Hasn't had to use the albuterol . She's not taking the lasix  right now. She does have a colonoscopy coming up. She's worried about the prep and her blood pressure. She does have midodrine  available, which she has not had to use recently.   Allergies  Allergen Reactions   Lidocaine  Shortness Of Breath and Anxiety    Patient felt like she couldn't breathe, panicky Allergic to all  caines   Mepivacaine Swelling    angioedema   Demerol  Nausea And Vomiting   Prednisone Hives and Nausea And Vomiting    abd pain and vomiting, Hives    Sulfa Antibiotics Hives    Hives, swelling and itching    Immunization History  Administered Date(s) Administered   Fluad Quad(high Dose 65+) 04/08/2022   PNEUMOCOCCAL CONJUGATE-20 06/04/2022    Past Medical History:  Diagnosis Date    Anxiety    no current tx   Depression    no meds at present   Dyspnea    Family history of adverse reaction to anesthesia    pt states mom had allergic reaction to some unknown anesthesia   GERD (gastroesophageal reflux disease)    no tx since weight loss   Hypercholesteremia    Osteoarthritis    stage IV lung ca 11/2021    Tobacco History: Social History   Tobacco Use  Smoking Status Every Day   Current packs/day: 0.50   Average packs/day: 0.5 packs/day for 37.0 years (18.5 ttl pk-yrs)   Types: Cigarettes   Passive exposure: Current  Smokeless Tobacco Never  Tobacco Comments   Smokes half a pack of cigarettes a day. 12/18/2022 Tay   Ready to quit: Not Answered Counseling given: Not Answered Tobacco comments: Smokes half a pack of cigarettes a day. 12/18/2022 Tay   Outpatient Medications Prior to Visit  Medication Sig Dispense Refill   acetaminophen  (TYLENOL ) 500 MG tablet Take 500 mg by mouth as needed for mild pain (pain score 1-3), moderate pain (pain score 4-6) or fever.     albuterol  (VENTOLIN  HFA) 108 (90 Base) MCG/ACT inhaler Inhale 2 puffs into the lungs every 6 (six) hours as needed for wheezing or shortness of breath. 8 g 2   ascorbic acid  (VITAMIN C ) 500 MG tablet Take 1,000 mg by mouth daily.     Blood Pressure Monitoring (OMRON 3 SERIES BP MONITOR) DEVI Use as directed 1 each 1   Budeson-Glycopyrrol-Formoterol  (BREZTRI  AEROSPHERE) 160-9-4.8 MCG/ACT AERO Inhale 2 puffs into the lungs in the morning and at bedtime. 10.7 g 3   carboxymethylcellulose 1 % ophthalmic solution Apply 1-2 drops to eye as needed (dry eye).     Cholecalciferol (VITAMIN D ) 50 MCG (2000 UT) CAPS Take by mouth.     ferrous sulfate  324 MG TBEC Take 1 tablet (324 mg total) by mouth daily with breakfast. 30 tablet 4   folic acid  (FOLVITE ) 1 MG tablet Take 1 tablet (1 mg total) by mouth daily. 90 tablet 1   gabapentin  (NEURONTIN ) 100 MG capsule Take 2 capsules (200 mg total) by mouth 2 (two) times  daily. (Patient taking differently: Take 100 mg by mouth 2 (two) times daily.) 360 capsule 1   lidocaine  (LMX) 4 % cream Apply topically 3 (three) times daily as needed (leg pain). (Patient taking differently: Apply 1 Application topically as needed (leg pain).) 30 g 0   midodrine  (PROAMATINE ) 5 MG tablet Take 1 tablet (5 mg total) by mouth 3 (three) times daily as needed (If your Systolic Blood Pressure is less than 90). 90 tablet 1   pantoprazole  (PROTONIX ) 40 MG tablet Take 40 mg by mouth daily.     polyethylene glycol-electrolytes (NULYTELY ) 420 g solution Take 8,000 mLs by mouth as directed. 8000 mL 0   Probiotic Product (PROBIOTIC PO) Take 1 capsule by mouth daily.     prochlorperazine  (COMPAZINE ) 10 MG tablet Take 10 mg by mouth as needed for nausea or vomiting. Every 3 weeks before chemo     Zinc 10 MG LOZG Use as directed in the mouth or throat.     furosemide  (LASIX ) 40 MG tablet Take 1 tablet (40 mg total) by mouth daily for 3 days. 3 tablet 0   No facility-administered medications prior to visit.     Review of Systems:   Constitutional: No night sweats, fevers, chills, or lassitude. +fatigue, weight loss HEENT: No headaches, difficulty swallowing, tooth/dental problems, or sore throat. No sneezing, itching, ear ache, nasal congestion, or post nasal drip CV:  +swelling in lower extremities (improved). No chest pain, orthopnea, PND, anasarca, dizziness, palpitations, syncope Resp: + shortness of breath with exertion (improved); chronic productive cough. No excess mucus or change in color of mucus. No hemoptysis. No wheezing.  No chest wall deformity GI:  No heartburn, indigestion, abdominal pain, nausea, vomiting, diarrhea, change in bowel habits, loss of appetite, bloody stools.  GU: No dysuria, change in color of urine, urgency or frequency.  No flank pain, no hematuria  Skin: No rash, lesions, ulcerations MSK:  No joint pain or swelling.  No decreased range of motion.  No back  pain. Neuro: No dizziness or lightheadedness.  Psych: No depression or anxiety. Mood stable.     Physical Exam:  BP 122/88   Pulse 94   SpO2 97%   GEN: Pleasant, interactive, well-appearing; in no acute distress HEENT:  Normocephalic and atraumatic. PERRLA. Sclera white. Nasal turbinates pink, moist and patent bilaterally. No rhinorrhea present. Oropharynx pink and moist, without exudate or edema. No lesions, ulcerations, or postnasal drip.  NECK:  Supple w/ fair ROM. No JVD present. Normal carotid impulses w/o bruits. Thyroid  symmetrical with no goiter or nodules palpated. No lymphadenopathy.   CV: RRR, no m/r/g. +1 LLE edema. Pulses intact, +2 bilaterally. No cyanosis, pallor or clubbing. PULMONARY:  Unlabored, regular breathing. Diminished bibasilar airflow R>L otherwise clear w/o wheezes/rales/rhonchi. No accessory muscle use.  GI: BS present and normoactive. Soft, non-tender to palpation. No organomegaly or masses detected.  MSK: No erythema, warmth or tenderness. Cap refil <2 sec all extrem. No deformities or joint swelling noted.  Neuro: A/Ox3. No focal deficits noted.   Skin: Warm, no lesions or rashes. Erythema to right lower leg (improving per pt); no exudate Psych: Normal affect and behavior. Judgement and thought content appropriate.     Lab Results:  CBC    Component Value Date/Time   WBC 6.8 04/23/2023 0835   WBC 2.1 (L) 03/20/2023 0510   RBC 3.10 (L) 04/23/2023 0835   HGB 11.3 (L) 04/23/2023 0835   HGB 10.5 (L) 03/24/2023 1444   HCT 33.4 (L) 04/23/2023 0835   HCT 31.8 (L) 03/24/2023 1444   PLT 210 04/23/2023 0835   PLT 103 (L) 03/24/2023 1444   MCV 107.7 (H) 04/23/2023 0835   MCV 107 (H) 03/24/2023 1444   MCH 36.5 (H) 04/23/2023 0835   MCHC 33.8 04/23/2023 0835   RDW 14.3 04/23/2023 0835   RDW 14.5 03/24/2023 1444   LYMPHSABS 2.0 04/23/2023 0835   LYMPHSABS 1.7 03/24/2023 1444   MONOABS 0.8 04/23/2023 0835   EOSABS 0.2 04/23/2023 0835   EOSABS 0.2  03/24/2023 1444   BASOSABS 0.1 04/23/2023 0835   BASOSABS 0.0 03/24/2023 1444    BMET    Component Value Date/Time   NA 145 04/23/2023 0835   NA 144 03/24/2023 1444   K 3.9 04/23/2023 0835   CL 109 04/23/2023 0835   CO2 29 04/23/2023 0835   GLUCOSE 90 04/23/2023 0835   BUN 29 (H) 04/23/2023 0835   BUN 33 (H) 03/24/2023 1444   CREATININE 1.21 (H) 04/23/2023 0835   CREATININE 0.84 11/21/2021 1447   CALCIUM 9.7 04/23/2023 0835   GFRNONAA 49 (L) 04/23/2023 0835    BNP    Component Value Date/Time   BNP 25.0 03/15/2023 1843     Imaging:  VAS US  LOWER EXTREMITY VENOUS (DVT) Result Date: 04/24/2023  Lower Venous DVT Study Patient Name:  Darcella E Stallworth  Date of Exam:   04/24/2023 Medical Rec #: [id number]        Accession #:    [id number] Date of Birth: Mar 21, 1956        Patient Gender: F Patient Age:   79 years Exam Location:  [facility] Procedure:      VAS US  LOWER EXTREMITY VENOUS (DVT) Referring Phys: CALTON DIENER --------------------------------------------------------------------------------  Indications: Swelling.  Risk Factors: Lung cancer / recent chemotherapy. Comparison Study: Previous exam on 02/24/2023 was negative for DVT Performing Technologist: Ezzie Potters RVT, RDMS  Examination Guidelines: A complete evaluation includes B-mode imaging, spectral Doppler, color Doppler, and power Doppler as needed of all accessible portions of each vessel. Bilateral testing is considered an integral part of a complete examination. Limited examinations for reoccurring indications may be performed as noted. The reflux portion of the exam is performed with the patient in reverse Trendelenburg.  +-----+---------------+---------+-----------+----------+--------------+  RIGHTCompressibilityPhasicitySpontaneityPropertiesThrombus Aging +-----+---------------+---------+-----------+----------+--------------+ CFV  Full           Yes      Yes                                  +-----+---------------+---------+-----------+----------+--------------+   +---------+---------------+---------+-----------+----------+-------------------+ LEFT     CompressibilityPhasicitySpontaneityPropertiesThrombus Aging      +---------+---------------+---------+-----------+----------+-------------------+ CFV      Full           Yes      Yes                                      +---------+---------------+---------+-----------+----------+-------------------+ SFJ      Full                                                             +---------+---------------+---------+-----------+----------+-------------------+ FV Prox  Full           Yes      Yes                                      +---------+---------------+---------+-----------+----------+-------------------+ FV Mid   Full           Yes      Yes                                      +---------+---------------+---------+-----------+----------+-------------------+ FV DistalFull           Yes      Yes                                      +---------+---------------+---------+-----------+----------+-------------------+ PFV      Full                                                             +---------+---------------+---------+-----------+----------+-------------------+ POP      Full           Yes      Yes                                      +---------+---------------+---------+-----------+----------+-------------------+ PTV      Full                                         Not well visualized +---------+---------------+---------+-----------+----------+-------------------+ PERO     Full  Not well visualized +---------+---------------+---------+-----------+----------+-------------------+    Summary: RIGHT: - No evidence of common femoral vein obstruction.  LEFT: - There is no evidence of deep vein thrombosis in the lower extremity. - No cystic structure found  in the popliteal fossa. Subcutaneous edema seen in area of calf and ankle.  *See table(s) above for measurements and observations. Electronically signed by Norman Serve on 04/24/2023 at 1:21:36 PM.    Final    DG Chest 2 View Result Date: 04/21/2023 CLINICAL DATA:  Follow-up pleural effusion EXAM: CHEST - 2 VIEW COMPARISON:  03/24/2023 FINDINGS: Right Port-A-Cath remains in place, unchanged. Small right pleural effusion, stable or slightly smaller since prior study. Minimal right basilar opacity, favor atelectasis. Left lung clear. Heart and mediastinal contours within normal limits. No acute bony abnormality. IMPRESSION: Small right pleural effusion, stable or slightly smaller since prior study. Right base atelectasis. Electronically Signed   By: Franky Crease M.D.   On: 04/21/2023 14:28   Intravitreal Injection, Pharmacologic Agent - OD - Right Eye Result Date: 04/08/2023 Time Out 04/08/2023. 2:33 PM. Confirmed correct patient, procedure, site, and patient consented. Anesthesia Topical anesthesia was used. Anesthetic medications included Lidocaine  2%, Proparacaine 0.5%. Procedure Preparation included 5% betadine to ocular surface, eyelid speculum. A supplied (32g) needle was used. Injection: 1.25 mg Bevacizumab  1.25mg /0.22ml   Route: Intravitreal, Site: Right Eye   NDC: H525437, Lot: [id number], Expiration date: 06/29/2023 Post-op Post injection exam found visual acuity of at least counting fingers. The patient tolerated the procedure well. There were no complications. The patient received written and verbal post procedure care education.   OCT, Retina - OU - Both Eyes Result Date: 04/08/2023 Right Eye Quality was good. Central Foveal Thickness: 260. Progression has improved. Findings include no IRF, abnormal foveal contour, pigment epithelial detachment, subretinal fluid, vitreomacular adhesion (Interval improvement central SRF and foveal contour ). Left Eye Quality was good. Central Foveal Thickness:  280. Progression has been stable. Findings include normal foveal contour, no IRF, no SRF, vitreomacular adhesion . Notes *Images captured and stored on drive Diagnosis / Impression: OD: Interval improvement central SRF and foveal contour OS: NFP, no IRF/SRF Clinical management: See below Abbreviations: NFP - Normal foveal profile. CME - cystoid macular edema. PED - pigment epithelial detachment. IRF - intraretinal fluid. SRF - subretinal fluid. EZ - ellipsoid zone. ERM - epiretinal membrane. ORA - outer retinal atrophy. ORT - outer retinal tubulation. SRHM - subretinal hyper-reflective material. IRHM - intraretinal hyper-reflective material   ECHOCARDIOGRAM LIMITED Result Date: 03/31/2023    ECHOCARDIOGRAM LIMITED REPORT   Patient Name:   Yvette Curry Date of Exam: 03/31/2023 Medical Rec #:  [id number]       Height:       61.0 in Accession #:    [id number]      Weight:       146.6 lb Date of Birth:  12/04/1955       BSA:          1.655 m Patient Age:    67 years        BP:           137/83 mmHg Patient Gender: F               HR:           89 bpm. Exam Location:  [facility] Procedure: Limited Echo, Cardiac Doppler, Limited Color Doppler and 3D Echo Indications:    CHF  History:        Patient has  prior history of Echocardiogram examinations, most                 recent 06/27/2022. CHF, CAD, Signs/Symptoms:Shortness of Breath                 and Dyspnea; Risk Factors:Dyslipidemia.  Sonographer:    Sabrina Gentry Referring Phys: [id number] ELIZABETH PECK IMPRESSIONS  1. Left ventricular ejection fraction, by estimation, is 55%. The left ventricle has normal function. Left ventricular endocardial border not optimally defined to evaluate regional wall motion. Left ventricular diastolic function could not be evaluated.  2. Right ventricular systolic function is normal. The right ventricular size is normal. Tricuspid regurgitation signal is inadequate for assessing PA pressure.  3. A small circumferential pericardial  effusion and moderate pericardial effusion anteriorly is present. There is no evidence of cardiac tamponade.  4. The aortic valve was not well visualized. Aortic valve regurgitation is mild. Aortic regurgitation PHT measures 947 msec.  5. The inferior vena cava is normal in size with greater than 50% respiratory variability, suggesting right atrial pressure of 3 mmHg. Comparison(s): Changes from prior study are noted. LVEF is normal now. Stable mild AR. FINDINGS  Left Ventricle: Left ventricular ejection fraction, by estimation, is 55%. The left ventricle has normal function. Left ventricular endocardial border not optimally defined to evaluate regional wall motion. The left ventricular internal cavity size was normal in size. There is no left ventricular hypertrophy. Left ventricular diastolic function could not be evaluated. Right Ventricle: The right ventricular size is normal. Right vetricular wall thickness was not well visualized. Right ventricular systolic function is normal. Tricuspid regurgitation signal is inadequate for assessing PA pressure. Pericardium: A small pericardial effusion is present. The pericardial effusion is circumferential. There is no evidence of cardiac tamponade. Presence of epicardial fat layer. Mitral Valve: The mitral valve is normal in structure. Tricuspid Valve: The tricuspid valve is normal in structure. Aortic Valve: The aortic valve was not well visualized. Aortic valve regurgitation is mild. Aortic regurgitation PHT measures 947 msec. Pulmonic Valve: The pulmonic valve was not well visualized. Venous: The inferior vena cava is normal in size with greater than 50% respiratory variability, suggesting right atrial pressure of 3 mmHg. LEFT VENTRICLE PLAX 2D LVIDd:         4.70 cm LVIDs:         3.30 cm LV PW:         0.70 cm LV IVS:        0.60 cm                             3D Volume EF:                             3D EF:        47 % LV Volumes (MOD)            LV EDV:       111  ml LV vol d, MOD A2C: 124.0 ml LV ESV:       59 ml LV vol d, MOD A4C: 108.0 ml LV SV:        52 ml LV vol s, MOD A2C: 51.7 ml LV SV MOD A2C:     72.3 ml LV SV MOD A4C:     108.0 ml IVC IVC diam: 1.40 cm AORTIC VALVE AI PHT:      947 msec  Vishnu Priya Mallipeddi Electronically signed by Diannah Late Mallipeddi Signature Date/Time: 03/31/2023/2:56:05 PM    Final    CT CHEST ABDOMEN PELVIS W CONTRAST Result Date: 03/27/2023 CLINICAL DATA:  Right upper lobe adenocarcinoma. Shortness of breath. * Tracking Code: BO * EXAM: CT CHEST, ABDOMEN, AND PELVIS WITH CONTRAST TECHNIQUE: Multidetector CT imaging of the chest, abdomen and pelvis was performed following the standard protocol during bolus administration of intravenous contrast. RADIATION DOSE REDUCTION: This exam was performed according to the departmental dose-optimization program which includes automated exposure control, adjustment of the mA and/or kV according to patient size and/or use of iterative reconstruction technique. CONTRAST:  80mL OMNIPAQUE  IOHEXOL  300 MG/ML  SOLN COMPARISON:  CT chest, abdomen, and pelvis dated 03/15/2023 FINDINGS: CT CHEST FINDINGS Cardiovascular: Right chest wall port terminates at the superior cavoatrial junction. Normal heart size. Similar small pericardial effusion. Great vessels are normal in course and caliber. No central pulmonary emboli. Coronary artery calcifications. Mediastinum/Nodes: Imaged thyroid  gland without nodules meeting criteria for imaging follow-up by size. Normal esophagus. Unchanged left axillary lymph nodes measuring up to 9 mm (2:17). No additional mediastinal 4 hilar lymphadenopathy. Lungs/Pleura: The central airways are patent. Unchanged right apical spiculated nodule measuring 1.1 x 0.9 cm (7:32). Upper lobe predominant centrilobular emphysema. Right lower lobe linear atelectasis/scarring. Similar to slightly decreased bibasilar interlobular septal thickening. No pneumothorax. Similar large right and  small left pleural effusions. Musculoskeletal: No acute or abnormal lytic or blastic osseous lesions. Multilevel degenerative changes of the thoracic spine. CT ABDOMEN PELVIS FINDINGS Hepatobiliary: No focal hepatic lesions. No intra or extrahepatic biliary ductal dilation. Normal gallbladder. Pancreas: No focal lesions or main ductal dilation. Spleen: Normal in size without focal abnormality. Adrenals/Urinary Tract: No adrenal nodules. No suspicious renal mass, calculi, or hydronephrosis. Underdistended urinary bladder. Stomach/Bowel: Normal appearance of the stomach. No evidence of bowel wall thickening, distention, or inflammatory changes. Colonic diverticulosis without acute diverticulitis. Normal appendix. Vascular/Lymphatic: Aortic atherosclerosis. No enlarged abdominal or pelvic lymph nodes. Reproductive: No adnexal masses. Other: Trace pericholecystic free fluid. No free air or fluid collection. Mild presacral edema. Musculoskeletal: No acute or abnormal lytic or blastic osseous findings. IMPRESSION: 1. Unchanged right apical spiculated nodule measuring 1.1 x 0.9 cm, consistent with known primary lung malignancy. 2. Unchanged left axillary lymph nodes measuring up to 9 mm. 3. Similar large right and small left pleural effusions with adjacent atelectasis/scarring. 4. Similar to slightly decreased bibasilar interlobular septal thickening, likely pulmonary edema. 5. No evidence of metastatic disease in the abdomen or pelvis. 6. Aortic Atherosclerosis (ICD10-I70.0) and Emphysema (ICD10-J43.9). Coronary artery calcifications. Assessment for potential risk factor modification, dietary therapy or pharmacologic therapy may be warranted, if clinically indicated. Electronically Signed   By: Limin  Xu M.D.   On: 03/27/2023 13:44    Bevacizumab  (AVASTIN ) SOLN 1.25 mg     Date Action Dose Route User   04/08/2023 1548 Given 1.25 mg Intravitreal (Right Eye) Valdemar Rogue, MD      cyanocobalamin  (VITAMIN B12)  injection 1,000 mcg     Date Action Dose Route User   04/23/2023 1035 Given 1,000 mcg Intramuscular (Right Deltoid) Gladis Odella MATSU, RN      heparin  lock flush 100 unit/mL     Date Action Dose Route User   Discharged on 03/20/2023   Admitted on 03/15/2023   03/11/2023 1445 Given 500 Units Intracatheter Voss, Shana M, RN      heparin  lock flush 100 unit/mL     Date Action Dose Route User   04/01/2023  1344 Given 500 Units Intracatheter Tan, Elizabeth M, RN      heparin  lock flush 100 unit/mL     Date Action Dose Route User   04/23/2023 1149 Given 500 Units Intracatheter Martin, Monica G, [place]      pembrolizumab  (KEYTRUDA ) 200 mg in sodium chloride  0.9 % 50 mL chemo infusion     Date Action Dose Route User   Discharged on 03/20/2023   Admitted on 03/15/2023   03/11/2023 1420 Rate/Dose Change (none) Intravenous Christiana Kerri HERO, RN   03/11/2023 1420 Rate/Dose Change (none) Intravenous Christiana Kerri HERO, RN   03/11/2023 1350 Rate/Dose Change (none) Intravenous Christiana Kerri HERO, RN   03/11/2023 1350 New Bag/Given 200 mg Intravenous Christiana Kerri M, RN      pembrolizumab  (KEYTRUDA ) 200 mg in sodium chloride  0.9 % 50 mL chemo infusion     Date Action Dose Route User   04/01/2023 1300 Infusion Verify (none) Intravenous Tan, Elizabeth M, RN   04/01/2023 1300 Rate/Dose Change (none) Intravenous Tan, Elizabeth M, RN   04/01/2023 1300 New Bag/Given 200 mg Intravenous Tan, Elizabeth M, RN      pembrolizumab  (KEYTRUDA ) 200 mg in sodium chloride  0.9 % 50 mL chemo infusion     Date Action Dose Route User   04/23/2023 1137 Infusion Verify (none) Intravenous Gladis Odella MATSU, RN   04/23/2023 1113 Rate/Dose Change (none) Intravenous Gladis Odella MATSU, RN   04/23/2023 1113 New Bag/Given 200 mg Intravenous Gladis Odella MATSU, RN      PEMEtrexed  (ALIMTA ) 700 mg in sodium chloride  0.9 % 100 mL chemo infusion     Date Action Dose Route User   Discharged on 03/20/2023   Admitted on 03/15/2023    03/11/2023 1431 Infusion Verify (none) Intravenous Christiana Kerri HERO, RN   03/11/2023 1431 Infusion Verify (none) Intravenous Christiana Kerri HERO, RN   03/11/2023 1431 New Bag/Given 700 mg Intravenous Christiana Kerri HERO, RN      0.9 %  sodium chloride  infusion     Date Action Dose Route User   Discharged on 03/20/2023   Admitted on 03/15/2023   03/11/2023 1445 Rate/Dose Change (none) Intravenous Christiana Kerri HERO, RN   03/11/2023 1443 Rate/Dose Change (none) Intravenous Christiana Kerri HERO, RN   03/11/2023 1432 Rate/Dose Change (none) Intravenous Christiana Kerri HERO, RN   03/11/2023 1432 Rate/Dose Change (none) Intravenous Christiana Kerri HERO, RN   03/11/2023 1428 Rate/Dose Change (none) Intravenous Christiana Kerri HERO, RN      0.9 %  sodium chloride  infusion     Date Action Dose Route User   04/01/2023 1342 Rate/Dose Change (none) Intravenous Waymond Almarie HERO, RN   04/01/2023 1339 Rate/Dose Change (none) Intravenous Tan, Elizabeth M, RN   04/01/2023 1239 Rate/Dose Change (none) Intravenous Waymond Almarie HERO, RN   04/01/2023 1239 New Bag/Given (none) Intravenous Waymond Almarie HERO, RN      0.9 %  sodium chloride  infusion     Date Action Dose Route User   04/23/2023 1147 Rate/Dose Change (none) Intravenous Gladis Odella MATSU, RN   04/23/2023 1025 New Bag/Given (none) Intravenous Gladis Odella MATSU, [place]      sodium chloride  flush (NS) 0.9 % injection 10 mL     Date Action Dose Route User   Discharged on 03/20/2023   Admitted on 03/15/2023   03/11/2023 1110 Given 10 mL Intracatheter Cates, Porsche L, LPN      sodium chloride  flush (NS) 0.9 % injection 10 mL     Date Action Dose Route User  Discharged on 03/20/2023   Admitted on 03/15/2023   03/11/2023 1446 Given 10 mL Intracatheter Christiana Kerri HERO, RN      sodium chloride  flush (NS) 0.9 % injection 10 mL     Date Action Dose Route User   04/01/2023 1112 Given 10 mL Intracatheter Alona Rhyme H, LPN      sodium chloride  flush (NS) 0.9 % injection 10 mL      Date Action Dose Route User   04/01/2023 1344 Given 10 mL Intracatheter Waymond Almarie HERO, RN      sodium chloride  flush (NS) 0.9 % injection 10 mL     Date Action Dose Route User   04/23/2023 0840 Given 10 mL Intracatheter Sherryle Hadassah CROME, LPN      sodium chloride  flush (NS) 0.9 % injection 10 mL     Date Action Dose Route User   04/23/2023 1149 Given 10 mL Intracatheter Gladis Odella MATSU, RN          Latest Ref Rng & Units 10/27/2022   11:42 AM  PFT Results  FVC-Pre L 1.85   FVC-Predicted Pre % 65   FVC-Post L 2.03   FVC-Predicted Post % 72   Pre FEV1/FVC % % 80   Post FEV1/FCV % % 73   FEV1-Pre L 1.47   FEV1-Predicted Pre % 68   FEV1-Post L 1.49   DLCO uncorrected ml/min/mmHg 9.92   DLCO UNC% % 56   DLVA Predicted % 68   TLC L 4.10   TLC % Predicted % 89   RV % Predicted % 98     No results found for: NITRICOXIDE      Assessment & Plan:   Bilateral pleural effusion Suspect related to volume overload given constellation of symptoms with weight gain, BLE edema, and new/enlarging b/l pleural effusions. She was treated with diuretic therapy and clinically improved. She is back to her dry weight of 144 lb. Recent echo with improved EF; unable to read diastolic parameters. Will recheck CXR today to ensure effusions have improved/resolved.   Patient Instructions  Continue Albuterol  inhaler 2 puffs every 6 hours as needed for shortness of breath or wheezing. Notify if symptoms persist despite rescue inhaler/neb use.  Continue Breztri  2 puffs Twice daily. Brush tongue and rinse mouth afterwards   Monitor weights at home and notify cardiology of weight gain 2-3 lb overnight or 5 lb in a week    Repeat chest x ray today   Glad you are feeling better!   Follow up in 3 months with Dr. Brenna or Izetta Malachy PIETY. If symptoms do not improve or worsen, please contact office for sooner follow up or seek emergency care.    Emphysema lung (HCC) Compensated on current regimen.  Encouraged to remain active. Smoking cessation advised. Action plan in place  Heart failure with improved ejection fraction (HFimpEF) (HCC) Follow up with cardiology as scheduled Monitor weights at home. Notify of weight gain of 2-3 lb overnight or 5 lb in a week.   Adenocarcinoma of right lung, stage 4 (HCC) Currently on immunotherapy with Keytruda . Alimta  d/c due to recurrent cellulitis. Recent restaging CT with stable right lung mass, improving pulmonary edema, and b/l effusions. See above. Plan to repeat in 3 months with oncology.   Hypotension Hx of hypotension following septic shock. She also had difficulties with labile BP due to multiple BP medications. She has not required any midodrine  at home. Advised her to push clear fluids, as directed by GI, with colonoscopy prep  and keep an eye on BP/associated symptoms. Notify GI if she has difficulties tolerating.  Follow up with cardiology as scheduled..     Advised if symptoms do not improve or worsen, to please contact office for sooner follow up or seek emergency care.   I spent 35 minutes of dedicated to the care of this patient on the date of this encounter to include pre-visit review of records, face-to-face time with the patient discussing conditions above, post visit ordering of testing, clinical documentation with the electronic health record, making appropriate referrals as documented, and communicating necessary findings to members of the patients care team.  Comer LULLA Rouleau, NP 04/24/2023  Pt aware and understands NP's role.

## 2023-04-22 NOTE — Telephone Encounter (Addendum)
 She has previously failed Suprep.  She did well with TriLyte  with her last colonoscopy.  If MiraLAX  is working well for her and she is having adequate bowel movements on a daily basis, we can take away the extra one half bowel prep.  Tammy, can you reach out to her cardiologist to see if they have any specific recommendations in regards to her blood pressure during colon prep/perioperatively.

## 2023-04-23 ENCOUNTER — Telehealth: Payer: Self-pay | Admitting: Internal Medicine

## 2023-04-23 ENCOUNTER — Inpatient Hospital Stay: Payer: Medicare Other

## 2023-04-23 ENCOUNTER — Inpatient Hospital Stay: Payer: Medicare Other | Admitting: Physician Assistant

## 2023-04-23 ENCOUNTER — Inpatient Hospital Stay: Payer: Medicare Other | Attending: Internal Medicine

## 2023-04-23 VITALS — BP 130/85 | HR 96 | Temp 98.0°F | Resp 17 | Wt 146.6 lb

## 2023-04-23 VITALS — BP 107/73 | HR 89 | Temp 98.1°F | Resp 18

## 2023-04-23 DIAGNOSIS — M7989 Other specified soft tissue disorders: Secondary | ICD-10-CM | POA: Diagnosis not present

## 2023-04-23 DIAGNOSIS — D649 Anemia, unspecified: Secondary | ICD-10-CM | POA: Insufficient documentation

## 2023-04-23 DIAGNOSIS — T451X5A Adverse effect of antineoplastic and immunosuppressive drugs, initial encounter: Secondary | ICD-10-CM

## 2023-04-23 DIAGNOSIS — Z5112 Encounter for antineoplastic immunotherapy: Secondary | ICD-10-CM | POA: Diagnosis not present

## 2023-04-23 DIAGNOSIS — I509 Heart failure, unspecified: Secondary | ICD-10-CM | POA: Insufficient documentation

## 2023-04-23 DIAGNOSIS — C3491 Malignant neoplasm of unspecified part of right bronchus or lung: Secondary | ICD-10-CM | POA: Diagnosis not present

## 2023-04-23 DIAGNOSIS — C778 Secondary and unspecified malignant neoplasm of lymph nodes of multiple regions: Secondary | ICD-10-CM | POA: Diagnosis present

## 2023-04-23 DIAGNOSIS — I3139 Other pericardial effusion (noninflammatory): Secondary | ICD-10-CM | POA: Diagnosis not present

## 2023-04-23 DIAGNOSIS — C3411 Malignant neoplasm of upper lobe, right bronchus or lung: Secondary | ICD-10-CM | POA: Insufficient documentation

## 2023-04-23 DIAGNOSIS — Z79899 Other long term (current) drug therapy: Secondary | ICD-10-CM | POA: Diagnosis not present

## 2023-04-23 DIAGNOSIS — K5900 Constipation, unspecified: Secondary | ICD-10-CM | POA: Diagnosis not present

## 2023-04-23 LAB — CMP (CANCER CENTER ONLY)
ALT: 13 U/L (ref 0–44)
AST: 20 U/L (ref 15–41)
Albumin: 3.7 g/dL (ref 3.5–5.0)
Alkaline Phosphatase: 73 U/L (ref 38–126)
Anion gap: 7 (ref 5–15)
BUN: 29 mg/dL — ABNORMAL HIGH (ref 8–23)
CO2: 29 mmol/L (ref 22–32)
Calcium: 9.7 mg/dL (ref 8.9–10.3)
Chloride: 109 mmol/L (ref 98–111)
Creatinine: 1.21 mg/dL — ABNORMAL HIGH (ref 0.44–1.00)
GFR, Estimated: 49 mL/min — ABNORMAL LOW (ref >60)
Glucose, Bld: 90 mg/dL (ref 70–99)
Potassium: 3.9 mmol/L (ref 3.5–5.1)
Sodium: 145 mmol/L (ref 135–145)
Total Bilirubin: 0.3 mg/dL (ref 0.0–1.2)
Total Protein: 6.8 g/dL (ref 6.5–8.1)

## 2023-04-23 LAB — CBC WITH DIFFERENTIAL (CANCER CENTER ONLY)
Abs Immature Granulocytes: 0.03 10*3/uL (ref 0.00–0.07)
Basophils Absolute: 0.1 10*3/uL (ref 0.0–0.1)
Basophils Relative: 1 %
Eosinophils Absolute: 0.2 10*3/uL (ref 0.0–0.5)
Eosinophils Relative: 3 %
HCT: 33.4 % — ABNORMAL LOW (ref 36.0–46.0)
Hemoglobin: 11.3 g/dL — ABNORMAL LOW (ref 12.0–15.0)
Immature Granulocytes: 0 %
Lymphocytes Relative: 29 %
Lymphs Abs: 2 10*3/uL (ref 0.7–4.0)
MCH: 36.5 pg — ABNORMAL HIGH (ref 26.0–34.0)
MCHC: 33.8 g/dL (ref 30.0–36.0)
MCV: 107.7 fL — ABNORMAL HIGH (ref 80.0–100.0)
Monocytes Absolute: 0.8 10*3/uL (ref 0.1–1.0)
Monocytes Relative: 12 %
Neutro Abs: 3.7 10*3/uL (ref 1.7–7.7)
Neutrophils Relative %: 55 %
Platelet Count: 210 10*3/uL (ref 150–400)
RBC: 3.1 MIL/uL — ABNORMAL LOW (ref 3.87–5.11)
RDW: 14.3 % (ref 11.5–15.5)
WBC Count: 6.8 10*3/uL (ref 4.0–10.5)
nRBC: 0 % (ref 0.0–0.2)

## 2023-04-23 LAB — TSH: TSH: 2.409 u[IU]/mL (ref 0.350–4.500)

## 2023-04-23 MED ORDER — SODIUM CHLORIDE 0.9 % IV SOLN
200.0000 mg | Freq: Once | INTRAVENOUS | Status: AC
  Administered 2023-04-23: 200 mg via INTRAVENOUS
  Filled 2023-04-23: qty 200

## 2023-04-23 MED ORDER — SODIUM CHLORIDE 0.9 % IV SOLN: Freq: Once | INTRAVENOUS | Status: AC

## 2023-04-23 MED ORDER — SODIUM CHLORIDE 0.9% FLUSH
10.0000 mL | Freq: Once | INTRAVENOUS | Status: AC
  Administered 2023-04-23: 10 mL

## 2023-04-23 MED ORDER — CYANOCOBALAMIN 1000 MCG/ML IJ SOLN
1000.0000 ug | Freq: Once | INTRAMUSCULAR | Status: AC
  Administered 2023-04-23: 1000 ug via INTRAMUSCULAR
  Filled 2023-04-23: qty 1

## 2023-04-23 MED ORDER — SODIUM CHLORIDE 0.9% FLUSH
10.0000 mL | INTRAVENOUS | Status: DC | PRN
  Administered 2023-04-23: 10 mL

## 2023-04-23 MED ORDER — HEPARIN SOD (PORK) LOCK FLUSH 100 UNIT/ML IV SOLN
500.0000 [IU] | Freq: Once | INTRAVENOUS | Status: AC | PRN
  Administered 2023-04-23: 500 [IU]

## 2023-04-23 NOTE — Progress Notes (Signed)
 Pt. states she took her own compazine at home this am.

## 2023-04-23 NOTE — Telephone Encounter (Signed)
 Spoke to [place] at [facility]. She took down information and will forward it to the pre-op team in regards for the recommendations of pt's blood pressure during colon prep/perioperatively.

## 2023-04-23 NOTE — Patient Instructions (Signed)
 [facility] - A DEPT OF [facility]  Discharge Instructions: Thank you for choosing [facility] to provide your oncology and hematology care.   If you have a lab appointment with the Cancer Center, please go directly to the Cancer Center and check in at the registration area.   Wear comfortable clothing and clothing appropriate for easy access to any Portacath or PICC line.   We strive to give you quality time with your provider. You may need to reschedule your appointment if you arrive late (15 or more minutes).  Arriving late affects you and other patients whose appointments are after yours.  Also, if you miss three or more appointments without notifying the office, you may be dismissed from the clinic at the provider's discretion.      For prescription refill requests, have your pharmacy contact our office and allow 72 hours for refills to be completed.    Today you received the following chemotherapy and/or immunotherapy agent: Pembrolizumab  (Keytruda )      To help prevent nausea and vomiting after your treatment, we encourage you to take your nausea medication as directed.  BELOW ARE SYMPTOMS THAT SHOULD BE REPORTED IMMEDIATELY: *FEVER GREATER THAN 100.4 F (38 C) OR HIGHER *CHILLS OR SWEATING *NAUSEA AND VOMITING THAT IS NOT CONTROLLED WITH YOUR NAUSEA MEDICATION *UNUSUAL SHORTNESS OF BREATH *UNUSUAL BRUISING OR BLEEDING *URINARY PROBLEMS (pain or burning when urinating, or frequent urination) *BOWEL PROBLEMS (unusual diarrhea, constipation, pain near the anus) TENDERNESS IN MOUTH AND THROAT WITH OR WITHOUT PRESENCE OF ULCERS (sore throat, sores in mouth, or a toothache) UNUSUAL RASH, SWELLING OR PAIN  UNUSUAL VAGINAL DISCHARGE OR ITCHING   Items with * indicate a potential emergency and should be followed up as soon as possible or go to the Emergency Department if any problems should occur.  Please show the CHEMOTHERAPY ALERT CARD or  IMMUNOTHERAPY ALERT CARD at check-in to the Emergency Department and triage nurse.  Should you have questions after your visit or need to cancel or reschedule your appointment, please contact [facility] - A DEPT OF [facility]  Dept: [phone]  and follow the prompts.  Office hours are 8:00 a.m. to 4:30 p.m. Monday - Friday. Please note that voicemails left after 4:00 p.m. may not be returned until the following business day.  We are closed weekends and major holidays. You have access to a nurse at all times for urgent questions. Please call the main number to the clinic Dept: [phone] and follow the prompts.   For any non-urgent questions, you may also contact your provider using MyChart. We now offer e-Visits for anyone 40 and older to request care online for non-urgent symptoms. For details visit [email or web address].   Also download the MyChart app! Go to the app store, search MyChart, open the app, select [place], and log in with your MyChart username and password.  Pembrolizumab  Injection What is this medication? PEMBROLIZUMAB  (PEM broe LIZ ue mab) treats some types of cancer. It works by helping your immune system slow or stop the spread of cancer cells. It is a monoclonal antibody. This medicine may be used for other purposes; ask your health care provider or pharmacist if you have questions. COMMON BRAND NAME(S): Keytruda  What should I tell my care team before I take this medication? They need to know if you have any of these conditions: Allogeneic stem cell transplant (uses someone else's stem  cells) Autoimmune diseases, such as Crohn disease, ulcerative colitis, lupus History of chest radiation Nervous system problems, such as Guillain-Barre syndrome, myasthenia gravis Organ transplant An unusual or allergic reaction to pembrolizumab , other medications, foods, dyes, or preservatives Pregnant or trying to get  pregnant Breast-feeding How should I use this medication? This medication is injected into a vein. It is given by your care team in a hospital or clinic setting. A special MedGuide will be given to you before each treatment. Be sure to read this information carefully each time. Talk to your care team about the use of this medication in children. While it may be prescribed for children as young as 6 months for selected conditions, precautions do apply. Overdosage: If you think you have taken too much of this medicine contact a poison control center or emergency room at once. NOTE: This medicine is only for you. Do not share this medicine with others. What if I miss a dose? Keep appointments for follow-up doses. It is important not to miss your dose. Call your care team if you are unable to keep an appointment. What may interact with this medication? Interactions have not been studied. This list may not describe all possible interactions. Give your health care provider a list of all the medicines, herbs, non-prescription drugs, or dietary supplements you use. Also tell them if you smoke, drink alcohol , or use illegal drugs. Some items may interact with your medicine. What should I watch for while using this medication? Your condition will be monitored carefully while you are receiving this medication. You may need blood work while taking this medication. This medication may cause serious skin reactions. They can happen weeks to months after starting the medication. Contact your care team right away if you notice fevers or flu-like symptoms with a rash. The rash may be red or purple and then turn into blisters or peeling of the skin. You may also notice a red rash with swelling of the face, lips, or lymph nodes in your neck or under your arms. Tell your care team right away if you have any change in your eyesight. Talk to your care team if you may be pregnant. Serious birth defects can occur if you  take this medication during pregnancy and for 4 months after the last dose. You will need a negative pregnancy test before starting this medication. Contraception is recommended while taking this medication and for 4 months after the last dose. Your care team can help you find the option that works for you. Do not breastfeed while taking this medication and for 4 months after the last dose. What side effects may I notice from receiving this medication? Side effects that you should report to your care team as soon as possible: Allergic reactions--skin rash, itching, hives, swelling of the face, lips, tongue, or throat Dry cough, shortness of breath or trouble breathing Eye pain, redness, irritation, or discharge with blurry or decreased vision Heart muscle inflammation--unusual weakness or fatigue, shortness of breath, chest pain, fast or irregular heartbeat, dizziness, swelling of the ankles, feet, or hands Hormone gland problems--headache, sensitivity to light, unusual weakness or fatigue, dizziness, fast or irregular heartbeat, increased sensitivity to cold or heat, excessive sweating, constipation, hair loss, increased thirst or amount of urine, tremors or shaking, irritability Infusion reactions--chest pain, shortness of breath or trouble breathing, feeling faint or lightheaded Kidney injury (glomerulonephritis)--decrease in the amount of urine, red or dark brown urine, foamy or bubbly urine, swelling of the ankles, hands, or  feet Liver injury--right upper belly pain, loss of appetite, nausea, light-colored stool, dark yellow or brown urine, yellowing skin or eyes, unusual weakness or fatigue Pain, tingling, or numbness in the hands or feet, muscle weakness, change in vision, confusion or trouble speaking, loss of balance or coordination, trouble walking, seizures Rash, fever, and swollen lymph nodes Redness, blistering, peeling, or loosening of the skin, including inside the mouth Sudden or  severe stomach pain, bloody diarrhea, fever, nausea, vomiting Side effects that usually do not require medical attention (report to your care team if they continue or are bothersome): Bone, joint, or muscle pain Diarrhea Fatigue Loss of appetite Nausea Skin rash This list may not describe all possible side effects. Call your doctor for medical advice about side effects. You may report side effects to FDA at 1-800-FDA-1088. Where should I keep my medication? This medication is given in a hospital or clinic. It will not be stored at home. NOTE: This sheet is a summary. It may not cover all possible information. If you have questions about this medicine, talk to your doctor, pharmacist, or health care provider.  2024 Elsevier/Gold Standard (2021-08-20 00:00:00) Vitamin B12 Injection What is this medication? Vitamin B12 (VAHY tuh min B12) prevents and treats low vitamin B12 levels in your body. It is used in people who do not get enough vitamin B12 from their diet or when their digestive tract does not absorb enough. Vitamin B12 plays an important role in maintaining the health of your nervous system and red blood cells. This medicine may be used for other purposes; ask your health care provider or pharmacist if you have questions. COMMON BRAND NAME(S): B-12 Compliance Kit, B-12 Injection Kit, Cyomin, Dodex , LA-12, Nutri-Twelve, Physicians EZ Use B-12, Primabalt, Vitamin Deficiency Injectable System - B12 What should I tell my care team before I take this medication? They need to know if you have any of these conditions: Kidney disease Leber's disease Megaloblastic anemia An unusual or allergic reaction to cyanocobalamin , cobalt, other medications, foods, dyes, or preservatives Pregnant or trying to get pregnant Breast-feeding How should I use this medication? This medication is injected into a muscle or deeply under the skin. It is usually given in a clinic or care team's office. However,  your care team may teach you how to inject yourself. Follow all instructions. Talk to your care team about the use of this medication in children. Special care may be needed. Overdosage: If you think you have taken too much of this medicine contact a poison control center or emergency room at once. NOTE: This medicine is only for you. Do not share this medicine with others. What if I miss a dose? If you are given your dose at a clinic or care team's office, call to reschedule your appointment. If you give your own injections, and you miss a dose, take it as soon as you can. If it is almost time for your next dose, take only that dose. Do not take double or extra doses. What may interact with this medication? Alcohol  Colchicine This list may not describe all possible interactions. Give your health care provider a list of all the medicines, herbs, non-prescription drugs, or dietary supplements you use. Also tell them if you smoke, drink alcohol , or use illegal drugs. Some items may interact with your medicine. What should I watch for while using this medication? Visit your care team regularly. You may need blood work done while you are taking this medication. You may need to follow  a special diet. Talk to your care team. Limit your alcohol  intake and avoid smoking to get the best benefit. What side effects may I notice from receiving this medication? Side effects that you should report to your care team as soon as possible: Allergic reactions--skin rash, itching, hives, swelling of the face, lips, tongue, or throat Swelling of the ankles, hands, or feet Trouble breathing Side effects that usually do not require medical attention (report to your care team if they continue or are bothersome): Diarrhea This list may not describe all possible side effects. Call your doctor for medical advice about side effects. You may report side effects to FDA at 1-800-FDA-1088. Where should I keep my  medication? Keep out of the reach of children. Store at room temperature between 15 and 30 degrees C (59 and 85 degrees F). Protect from light. Throw away any unused medication after the expiration date. NOTE: This sheet is a summary. It may not cover all possible information. If you have questions about this medicine, talk to your doctor, pharmacist, or health care provider.  2024 Elsevier/Gold Standard (2020-12-18 00:00:00)

## 2023-04-23 NOTE — Telephone Encounter (Signed)
 Pt informed that if Mirlax is working well for her and having good bm's on daily basis, she can hold off on the extra day of bowel prep. Pt states she thinks she is having good bm's but she is just going to leave the bowel prep the way it is because she doesn't want to be told that she is not cleaned out and have to do another one.

## 2023-04-23 NOTE — Telephone Encounter (Signed)
   Pre-operative Risk Assessment    Patient Name: Yvette Curry  DOB: 20-Aug-1955 MRN: [id number]   Date of last office visit: 04/07/23 Date of next office visit: 10/12/2023   Request for Surgical Clearance    Procedure:   Colonoscopy  Date of Surgery:  Clearance 05/05/23                                Surgeon:  Dr. Toribio Fortune Surgeon's Group or Practice Name:  [facility] Phone number:  [phone]  Fax number:  [phone]   Type of Clearance Requested:   - Medical  - Pharmacy:  Hold        Type of Anesthesia:   Propofol    Additional requests/questions:   Caller (Tammy) stated they will need pharmacy clearance and if patient will need peri-operative recommendations  Signed, Herma KATHEE Blush   04/23/2023, 8:56 AM

## 2023-04-23 NOTE — Telephone Encounter (Signed)
   Primary Cardiologist: Vishnu P Mallipeddi, MD  Chart reviewed as part of pre-operative protocol coverage. Given past medical history and time since last visit, based on ACC/AHA guidelines, Yvette Curry would be at acceptable risk for the planned procedure without further cardiovascular testing.   Patient was advised that if she develops new symptoms prior to surgery to contact our office to arrange a follow-up appointment.    There are no cardiac medications indicated to be held.   I will route this recommendation to the requesting party via Epic fax function and remove from pre-op pool.  Please call with questions.  Yvette EMERSON Bane, NP-C  04/23/2023, 11:10 AM [address] Office [phone] Fax [phone]

## 2023-04-24 ENCOUNTER — Ambulatory Visit (HOSPITAL_COMMUNITY)
Admission: RE | Admit: 2023-04-24 | Discharge: 2023-04-24 | Disposition: A | Discharge status: Home / Self Care | Payer: Medicare Other | Source: Ambulatory Visit | Attending: Physician Assistant | Admitting: Physician Assistant

## 2023-04-24 ENCOUNTER — Telehealth: Payer: Self-pay | Admitting: Physician Assistant

## 2023-04-24 ENCOUNTER — Encounter: Payer: Self-pay | Admitting: Nurse Practitioner

## 2023-04-24 ENCOUNTER — Telehealth: Payer: Self-pay | Admitting: Gastroenterology

## 2023-04-24 DIAGNOSIS — M7989 Other specified soft tissue disorders: Secondary | ICD-10-CM | POA: Diagnosis present

## 2023-04-24 LAB — T4: T4, Total: 9.5 ug/dL (ref 4.5–12.0)

## 2023-04-24 NOTE — Assessment & Plan Note (Signed)
 Hx of hypotension following septic shock. She also had difficulties with labile BP due to multiple BP medications. She has not required any midodrine  at home. Advised her to push clear fluids, as directed by GI, with colonoscopy prep and keep an eye on BP/associated symptoms. Notify GI if she has difficulties tolerating.  Follow up with cardiology as scheduled.SABRA

## 2023-04-24 NOTE — Telephone Encounter (Signed)
 Please see telephone encounter 04/23/23 from cardiology.

## 2023-04-24 NOTE — Assessment & Plan Note (Signed)
 Compensated on current regimen. Encouraged to remain active. Smoking cessation advised. Action plan in place

## 2023-04-24 NOTE — Assessment & Plan Note (Signed)
 Suspect related to volume overload given constellation of symptoms with weight gain, BLE edema, and new/enlarging b/l pleural effusions. She was treated with diuretic therapy and clinically improved. She is back to her dry weight of 144 lb. Recent echo with improved EF; unable to read diastolic parameters. Will recheck CXR today to ensure effusions have improved/resolved.   Patient Instructions  Continue Albuterol  inhaler 2 puffs every 6 hours as needed for shortness of breath or wheezing. Notify if symptoms persist despite rescue inhaler/neb use.  Continue Breztri  2 puffs Twice daily. Brush tongue and rinse mouth afterwards   Monitor weights at home and notify cardiology of weight gain 2-3 lb overnight or 5 lb in a week    Repeat chest x ray today   Glad you are feeling better!   Follow up in 3 months with Dr. Brenna or Izetta Malachy PIETY. If symptoms do not improve or worsen, please contact office for sooner follow up or seek emergency care.

## 2023-04-24 NOTE — Assessment & Plan Note (Signed)
 Follow up with cardiology as scheduled Monitor weights at home. Notify of weight gain of 2-3 lb overnight or 5 lb in a week.

## 2023-04-24 NOTE — Telephone Encounter (Signed)
 They have cleared patient for procedure. However, I am not sure they were aware of the specific concerns regarding patient's concern for hypotension during colon prep. I imaging they will just tell her to monitor her BP closely and take her midodrine  as needed, but would be nice to have their recs on the chart as the patient expressed concern about this.

## 2023-04-24 NOTE — Telephone Encounter (Signed)
 Yvette Curry, please let patient know that I discussed her case with Dr. Eartha who suspects that she does have Crohn's disease and recommends that we go ahead and get started on a biologic medication.  He is okay with her starting Humira if we can get this approved by her insurance company.  Please start the approval process for Humira.  Let me know which pharmacy I should send this to for her insurance.   Once Humira is approved, we will need to get her enrolled in the nurse ambassador program as well.  Mandy:  Please arrange 2 month follow-up.

## 2023-04-24 NOTE — Assessment & Plan Note (Signed)
 Currently on immunotherapy with Keytruda. Alimta d/c due to recurrent cellulitis. Recent restaging CT with stable right lung mass, improving pulmonary edema, and b/l effusions. See above. Plan to repeat in 3 months with oncology.

## 2023-04-24 NOTE — Telephone Encounter (Signed)
 I called the patient to let her know her left leg was negative for DVT. She will continue to follow with PCP, vascular, and cardiology for the swelling.

## 2023-04-27 NOTE — Telephone Encounter (Signed)
 Noted.

## 2023-04-27 NOTE — Telephone Encounter (Signed)
 Spoke to pt, informed her of recommendations, Pt voiced that she did not understand how this diagnose came about. She states she would like to speak to provider before she starts any medications. A prescription needs to be sent to [facility].

## 2023-04-27 NOTE — Telephone Encounter (Addendum)
 This was a mistake. She doesn't have Crohn's disease. This note was for another patient. I have called the patient an explained this situation. She was very understanding and forgiving.   I will send you a separate note for the patient this was intended for.   Mandy:  Please disregard the prior note in regards to arranging follow-up.

## 2023-04-28 ENCOUNTER — Telehealth: Payer: Self-pay | Admitting: *Deleted

## 2023-04-28 ENCOUNTER — Encounter (HOSPITAL_COMMUNITY)
Admission: RE | Admit: 2023-04-28 | Discharge: 2023-04-28 | Disposition: A | Discharge status: Home / Self Care | Payer: Medicare Other | Source: Ambulatory Visit | Attending: Gastroenterology | Admitting: Gastroenterology

## 2023-04-28 ENCOUNTER — Ambulatory Visit: Payer: Medicare Other | Admitting: Family Medicine

## 2023-04-28 ENCOUNTER — Encounter (HOSPITAL_COMMUNITY): Payer: Self-pay

## 2023-04-28 NOTE — Patient Instructions (Signed)
 Yvette Curry  04/28/2023     @PREFPERIOPPHARMACY @   Your procedure is scheduled on  05/05/2023.   Report to [facility] at  0600 A.M.   Call this number if you have problems the morning of surgery:  [phone]  If you experience any cold or flu symptoms such as cough, fever, chills, shortness of breath, etc. between now and your scheduled surgery, please notify us  at the above number.   Remember:        Use your inhalers before you come and bring your rescue inhaler with you.    Follow the diet and prep instructions given to you by the office.    You may drink clear liquids until 0330 am on 05/05/2023.       Clear liquids allowed are:                    Water , Juice (No red color; non-citric and without pulp; diabetics please choose diet or no sugar options), Carbonated beverages (diabetics please choose diet or no sugar options), Clear Tea (No creamer, milk, or cream, including half & half and powdered creamer), Black Coffee Only (No creamer, milk or cream, including half & half and powdered creamer), and Clear Sports drink (No red color; diabetics please choose diet or no sugar options)    Take these medicines the morning of surgery with A SIP OF WATER             gabapentin , pantoprazole . Midodrine  and prochlorperazine (if needed).     Do not wear jewelry, make-up or nail polish, including gel polish,  artificial nails, or any other type of covering on natural nails (fingers and  toes).  Do not wear lotions, powders, or perfumes, or deodorant.  Do not shave 48 hours prior to surgery.  Men may shave face and neck.  Do not bring valuables to the hospital.  [facility] is not responsible for any belongings or valuables.  Contacts, dentures or bridgework may not be worn into surgery.  Leave your suitcase in the car.  After surgery it may be brought to your room.  For patients admitted to the hospital, discharge time will be determined by your treatment  team.  Patients discharged the day of surgery will not be allowed to drive home and must have someone with them for 24 hours.    Special instructions:   DO NOT smoke tobacco or vape for 24 hours before your procedure.  Please read over the following fact sheets that you were given. Anesthesia Post-op Instructions and Care and Recovery After Surgery      Upper Endoscopy, Adult, Care After After the procedure, it is common to have a sore throat. It is also common to have: Mild stomach pain or discomfort. Bloating. Nausea. Follow these instructions at home: The instructions below may help you care for yourself at home. Your health care provider may give you more instructions. If you have questions, ask your health care provider. If you were given a sedative during the procedure, it can affect you for several hours. Do not drive or operate machinery until your health care provider says that it is safe. If you will be going home right after the procedure, plan to have a responsible adult: Take you home from the hospital or clinic. You will not be allowed to drive. Care for you for the time you are told. Follow instructions from your health care provider about what you may  eat and drink. Return to your normal activities as told by your health care provider. Ask your health care provider what activities are safe for you. Take over-the-counter and prescription medicines only as told by your health care provider. Contact a health care provider if you: Have a sore throat that lasts longer than one day. Have trouble swallowing. Have a fever. Get help right away if you: Vomit blood or your vomit looks like coffee grounds. Have bloody, black, or tarry stools. Have a very bad sore throat or you cannot swallow. Have difficulty breathing or very bad pain in your chest or abdomen. These symptoms may be an emergency. Get help right away. Call 911. Do not wait to see if the symptoms will go  away. Do not drive yourself to the hospital. Summary After the procedure, it is common to have a sore throat, mild stomach discomfort, bloating, and nausea. If you were given a sedative during the procedure, it can affect you for several hours. Do not drive until your health care provider says that it is safe. Follow instructions from your health care provider about what you may eat and drink. Return to your normal activities as told by your health care provider. This information is not intended to replace advice given to you by your health care provider. Make sure you discuss any questions you have with your health care provider. Document Revised: 07/17/2021 Document Reviewed: 07/17/2021 Elsevier Patient Education  2024 Elsevier Inc. Colonoscopy, Adult, Care After The following information offers guidance on how to care for yourself after your procedure. Your health care provider may also give you more specific instructions. If you have problems or questions, contact your health care provider. What can I expect after the procedure? After the procedure, it is common to have: A small amount of blood in your stool for 24 hours after the procedure. Some gas. Mild cramping or bloating of your abdomen. Follow these instructions at home: Eating and drinking  Drink enough fluid to keep your urine pale yellow. Follow instructions from your health care provider about eating or drinking restrictions. Resume your normal diet as told by your health care provider. Avoid heavy or fried foods that are hard to digest. Activity Rest as told by your health care provider. Avoid sitting for a long time without moving. Get up to take short walks every 1-2 hours. This is important to improve blood flow and breathing. Ask for help if you feel weak or unsteady. Return to your normal activities as told by your health care provider. Ask your health care provider what activities are safe for you. Managing cramping  and bloating  Try walking around when you have cramps or feel bloated. If directed, apply heat to your abdomen as told by your health care provider. Use the heat source that your health care provider recommends, such as a moist heat pack or a heating pad. Place a towel between your skin and the heat source. Leave the heat on for 20-30 minutes. Remove the heat if your skin turns bright red. This is especially important if you are unable to feel pain, heat, or cold. You have a greater risk of getting burned. General instructions If you were given a sedative during the procedure, it can affect you for several hours. Do not drive or operate machinery until your health care provider says that it is safe. For the first 24 hours after the procedure: Do not sign important documents. Do not drink alcohol . Do your regular daily  activities at a slower pace than normal. Eat soft foods that are easy to digest. Take over-the-counter and prescription medicines only as told by your health care provider. Keep all follow-up visits. This is important. Contact a health care provider if: You have blood in your stool 2-3 days after the procedure. Get help right away if: You have more than a small spotting of blood in your stool. You have large blood clots in your stool. You have swelling of your abdomen. You have nausea or vomiting. You have a fever. You have increasing pain in your abdomen that is not relieved with medicine. These symptoms may be an emergency. Get help right away. Call 911. Do not wait to see if the symptoms will go away. Do not drive yourself to the hospital. Summary After the procedure, it is common to have a small amount of blood in your stool. You may also have mild cramping and bloating of your abdomen. If you were given a sedative during the procedure, it can affect you for several hours. Do not drive or operate machinery until your health care provider says that it is safe. Get help  right away if you have a lot of blood in your stool, nausea or vomiting, a fever, or increased pain in your abdomen. This information is not intended to replace advice given to you by your health care provider. Make sure you discuss any questions you have with your health care provider. Document Revised: 05/20/2022 Document Reviewed: 11/28/2020 Elsevier Patient Education  2024 Elsevier Inc. Monitored Anesthesia Care, Care After The following information offers guidance on how to care for yourself after your procedure. Your health care provider may also give you more specific instructions. If you have problems or questions, contact your health care provider. What can I expect after the procedure? After the procedure, it is common to have: Tiredness. Little or no memory about what happened during or after the procedure. Impaired judgment when it comes to making decisions. Nausea or vomiting. Some trouble with balance. Follow these instructions at home: For the time period you were told by your health care provider:  Rest. Do not participate in activities where you could fall or become injured. Do not drive or use machinery. Do not drink alcohol . Do not take sleeping pills or medicines that cause drowsiness. Do not make important decisions or sign legal documents. Do not take care of children on your own. Medicines Take over-the-counter and prescription medicines only as told by your health care provider. If you were prescribed antibiotics, take them as told by your health care provider. Do not stop using the antibiotic even if you start to feel better. Eating and drinking Follow instructions from your health care provider about what you may eat and drink. Drink enough fluid to keep your urine pale yellow. If you vomit: Drink clear fluids slowly and in small amounts as you are able. Clear fluids include water , ice chips, low-calorie sports drinks, and fruit juice that has water  added to  it (diluted fruit juice). Eat light and bland foods in small amounts as you are able. These foods include bananas, applesauce, rice, lean meats, toast, and crackers. General instructions  Have a responsible adult stay with you for the time you are told. It is important to have someone help care for you until you are awake and alert. If you have sleep apnea, surgery and some medicines can increase your risk for breathing problems. Follow instructions from your health care provider about wearing  your sleep device: When you are sleeping. This includes during daytime naps. While taking prescription pain medicines, sleeping medicines, or medicines that make you drowsy. Do not use any products that contain nicotine  or tobacco. These products include cigarettes, chewing tobacco, and vaping devices, such as e-cigarettes. If you need help quitting, ask your health care provider. Contact a health care provider if: You feel nauseous or vomit every time you eat or drink. You feel light-headed. You are still sleepy or having trouble with balance after 24 hours. You get a rash. You have a fever. You have redness or swelling around the IV site. Get help right away if: You have trouble breathing. You have new confusion after you get home. These symptoms may be an emergency. Get help right away. Call 911. Do not wait to see if the symptoms will go away. Do not drive yourself to the hospital. This information is not intended to replace advice given to you by your health care provider. Make sure you discuss any questions you have with your health care provider. Document Revised: 09/02/2021 Document Reviewed: 09/02/2021 Elsevier Patient Education  2024 [employer].

## 2023-04-28 NOTE — Telephone Encounter (Signed)
 Pt did not have to have a root canal. So she is good to go for both procedures. FYI

## 2023-04-28 NOTE — Telephone Encounter (Signed)
 Noted.

## 2023-04-28 NOTE — Telephone Encounter (Signed)
 Pt says she was instructed by cardiology to keep close check on B/P while doing the colon prep and told to take the midorine as needed.

## 2023-04-28 NOTE — Telephone Encounter (Signed)
 The EGD would be difficult as we will place a bite block to protect her teeth but if she unconsciously bites may damage the dental work.   She can proceed with colonoscopy now, no contraindication for this

## 2023-04-28 NOTE — Telephone Encounter (Signed)
 Thanks

## 2023-04-28 NOTE — Telephone Encounter (Signed)
 Pt says she forgot to ask at pre-op this morning would she still be able to have her colonoscopy/EGD on 05/05/23 if she has a root canal this morning. Please advise. Thank you

## 2023-05-04 NOTE — Anesthesia Preprocedure Evaluation (Addendum)
 Anesthesia Evaluation  Patient identified by MRN, date of birth, ID band Patient awake    Reviewed: Allergy & Precautions, NPO status , Patient's Chart, lab work & pertinent test results  History of Anesthesia Complications (+) Family history of anesthesia reaction  Airway Mallampati: II  TM Distance: >3 FB Neck ROM: Full    Dental  (+) Dental Advisory Given, Caps Cap has come off right molar lower.:   Pulmonary shortness of breath, COPD, Current Smoker and Patient abstained from smoking. Stage 4 lung cancer   Pulmonary exam normal breath sounds clear to auscultation       Cardiovascular + CAD and +CHF  Normal cardiovascular exam Rhythm:Regular Rate:Normal  EF 55% on echo but CHF and CAD listed on echo report as the history   Neuro/Psych  PSYCHIATRIC DISORDERS Anxiety Depression    negative neurological ROS     GI/Hepatic Neg liver ROS,GERD  Medicated,,  Endo/Other  negative endocrine ROS    Renal/GU negative Renal ROS  negative genitourinary   Musculoskeletal  (+) Arthritis , Osteoarthritis,    Abdominal   Peds negative pediatric ROS (+)  Hematology negative hematology ROS (+)   Anesthesia Other Findings   Reproductive/Obstetrics negative OB ROS                             Anesthesia Physical Anesthesia Plan  ASA: 3  Anesthesia Plan: General   Post-op Pain Management: Minimal or no pain anticipated   Induction: Intravenous  PONV Risk Score and Plan: Propofol  infusion  Airway Management Planned: Nasal Cannula and Natural Airway  Additional Equipment: None  Intra-op Plan:   Post-operative Plan:   Informed Consent: I have reviewed the patients History and Physical, chart, labs and discussed the procedure including the risks, benefits and alternatives for the proposed anesthesia with the patient or authorized representative who has indicated his/her understanding and  acceptance.     Dental advisory given  Plan Discussed with: CRNA  Anesthesia Plan Comments:         Anesthesia Quick Evaluation

## 2023-05-05 ENCOUNTER — Ambulatory Visit (HOSPITAL_BASED_OUTPATIENT_CLINIC_OR_DEPARTMENT_OTHER): Payer: Medicare Other | Admitting: Anesthesiology

## 2023-05-05 ENCOUNTER — Ambulatory Visit (HOSPITAL_COMMUNITY): Payer: Medicare Other | Admitting: Anesthesiology

## 2023-05-05 ENCOUNTER — Encounter (HOSPITAL_COMMUNITY)
Admission: RE | Disposition: A | Discharge status: Home / Self Care | Payer: Self-pay | Source: Home / Self Care | Attending: Gastroenterology

## 2023-05-05 ENCOUNTER — Encounter (HOSPITAL_COMMUNITY): Payer: Self-pay | Admitting: Gastroenterology

## 2023-05-05 ENCOUNTER — Ambulatory Visit (HOSPITAL_COMMUNITY)
Admission: RE | Admit: 2023-05-05 | Discharge: 2023-05-05 | Disposition: A | Discharge status: Home / Self Care | Payer: Medicare Other | Attending: Gastroenterology | Admitting: Gastroenterology

## 2023-05-05 DIAGNOSIS — F419 Anxiety disorder, unspecified: Secondary | ICD-10-CM | POA: Insufficient documentation

## 2023-05-05 DIAGNOSIS — Z860100 Personal history of colon polyps, unspecified: Secondary | ICD-10-CM

## 2023-05-05 DIAGNOSIS — F17210 Nicotine dependence, cigarettes, uncomplicated: Secondary | ICD-10-CM | POA: Insufficient documentation

## 2023-05-05 DIAGNOSIS — Z860101 Personal history of adenomatous and serrated colon polyps: Secondary | ICD-10-CM | POA: Insufficient documentation

## 2023-05-05 DIAGNOSIS — K219 Gastro-esophageal reflux disease without esophagitis: Secondary | ICD-10-CM | POA: Diagnosis not present

## 2023-05-05 DIAGNOSIS — J449 Chronic obstructive pulmonary disease, unspecified: Secondary | ICD-10-CM | POA: Insufficient documentation

## 2023-05-05 DIAGNOSIS — I509 Heart failure, unspecified: Secondary | ICD-10-CM | POA: Insufficient documentation

## 2023-05-05 DIAGNOSIS — Z79899 Other long term (current) drug therapy: Secondary | ICD-10-CM | POA: Diagnosis not present

## 2023-05-05 DIAGNOSIS — E7800 Pure hypercholesterolemia, unspecified: Secondary | ICD-10-CM | POA: Insufficient documentation

## 2023-05-05 DIAGNOSIS — D122 Benign neoplasm of ascending colon: Secondary | ICD-10-CM | POA: Insufficient documentation

## 2023-05-05 DIAGNOSIS — Z1211 Encounter for screening for malignant neoplasm of colon: Secondary | ICD-10-CM | POA: Insufficient documentation

## 2023-05-05 DIAGNOSIS — K5730 Diverticulosis of large intestine without perforation or abscess without bleeding: Secondary | ICD-10-CM | POA: Insufficient documentation

## 2023-05-05 DIAGNOSIS — I2510 Atherosclerotic heart disease of native coronary artery without angina pectoris: Secondary | ICD-10-CM | POA: Insufficient documentation

## 2023-05-05 DIAGNOSIS — K2980 Duodenitis without bleeding: Secondary | ICD-10-CM | POA: Insufficient documentation

## 2023-05-05 DIAGNOSIS — C3490 Malignant neoplasm of unspecified part of unspecified bronchus or lung: Secondary | ICD-10-CM | POA: Insufficient documentation

## 2023-05-05 DIAGNOSIS — R634 Abnormal weight loss: Secondary | ICD-10-CM | POA: Insufficient documentation

## 2023-05-05 DIAGNOSIS — D124 Benign neoplasm of descending colon: Secondary | ICD-10-CM | POA: Diagnosis not present

## 2023-05-05 DIAGNOSIS — R1319 Other dysphagia: Secondary | ICD-10-CM

## 2023-05-05 DIAGNOSIS — D123 Benign neoplasm of transverse colon: Secondary | ICD-10-CM

## 2023-05-05 DIAGNOSIS — F32A Depression, unspecified: Secondary | ICD-10-CM | POA: Insufficient documentation

## 2023-05-05 DIAGNOSIS — R1310 Dysphagia, unspecified: Secondary | ICD-10-CM | POA: Insufficient documentation

## 2023-05-05 DIAGNOSIS — R0602 Shortness of breath: Secondary | ICD-10-CM | POA: Insufficient documentation

## 2023-05-05 DIAGNOSIS — M1990 Unspecified osteoarthritis, unspecified site: Secondary | ICD-10-CM | POA: Insufficient documentation

## 2023-05-05 DIAGNOSIS — D175 Benign lipomatous neoplasm of intra-abdominal organs: Secondary | ICD-10-CM | POA: Diagnosis not present

## 2023-05-05 HISTORY — PX: BIOPSY: SHX5522

## 2023-05-05 HISTORY — PX: POLYPECTOMY: SHX149

## 2023-05-05 HISTORY — PX: ESOPHAGOGASTRODUODENOSCOPY (EGD) WITH PROPOFOL: SHX5813

## 2023-05-05 HISTORY — PX: COLONOSCOPY WITH PROPOFOL: SHX5780

## 2023-05-05 HISTORY — PX: SAVORY DILATION: SHX5439

## 2023-05-05 LAB — HM COLONOSCOPY

## 2023-05-05 SURGERY — COLONOSCOPY WITH PROPOFOL
Anesthesia: General

## 2023-05-05 MED ORDER — STERILE WATER FOR IRRIGATION IR SOLN
Status: DC | PRN
  Administered 2023-05-05: 60 mL

## 2023-05-05 MED ORDER — MIDAZOLAM HCL 2 MG/2ML IJ SOLN
INTRAMUSCULAR | Status: AC
Start: 2023-05-05 — End: ?
  Filled 2023-05-05: qty 2

## 2023-05-05 MED ORDER — PROPOFOL 10 MG/ML IV BOLUS
INTRAVENOUS | Status: DC | PRN
  Administered 2023-05-05: 30 mg via INTRAVENOUS
  Administered 2023-05-05: 50 mg via INTRAVENOUS
  Administered 2023-05-05: 100 mg via INTRAVENOUS

## 2023-05-05 MED ORDER — PHENYLEPHRINE 80 MCG/ML (10ML) SYRINGE FOR IV PUSH (FOR BLOOD PRESSURE SUPPORT)
PREFILLED_SYRINGE | INTRAVENOUS | Status: DC | PRN
  Administered 2023-05-05 (×7): 80 ug via INTRAVENOUS

## 2023-05-05 MED ORDER — PROPOFOL 500 MG/50ML IV EMUL
INTRAVENOUS | Status: DC | PRN
  Administered 2023-05-05: 150 ug/kg/min via INTRAVENOUS

## 2023-05-05 MED ORDER — MIDAZOLAM HCL 2 MG/2ML IJ SOLN
INTRAMUSCULAR | Status: DC | PRN
  Administered 2023-05-05: 2 mg via INTRAVENOUS

## 2023-05-05 MED ORDER — LACTATED RINGERS IV SOLN: INTRAVENOUS | Status: DC | PRN

## 2023-05-05 MED ORDER — PHENYLEPHRINE 80 MCG/ML (10ML) SYRINGE FOR IV PUSH (FOR BLOOD PRESSURE SUPPORT)
PREFILLED_SYRINGE | INTRAVENOUS | Status: AC
  Filled 2023-05-05: qty 10

## 2023-05-05 NOTE — Discharge Instructions (Addendum)
You are being discharged to home.  Resume your previous diet.  We are waiting for your pathology results.  Continue your present medications.  Your physician has recommended a repeat colonoscopy in three years for surveillance.

## 2023-05-05 NOTE — Op Note (Addendum)
 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 Shirlean Balm, Technician Referring MD:              Medicines:                Monitored Anesthesia Care Complications:            No immediate complications. Estimated Blood Loss:     Estimated blood loss: none. Procedure:                Pre-Anesthesia Assessment:                           - Prior to the procedure, a History and Physical                            was performed, and patient medications, allergies                            and sensitivities were reviewed. The patient's                            tolerance of previous anesthesia was reviewed.                           - The risks and benefits of the procedure and the                            sedation options and risks were discussed with the                            patient. All questions were answered and informed                            consent was obtained.                           - ASA Grade Assessment: III - A patient with severe                            systemic disease.                           After obtaining informed consent, the colonoscope  was passed under direct vision. Throughout the                            procedure, the patient's blood pressure, pulse, and                            oxygen  saturations were monitored continuously. The                            [id number]-[id number] ([id number]) scope was introduced through                            the anus and advanced to the the cecum, identified                             by appendiceal orifice and ileocecal valve. The                            colonoscopy was performed without difficulty. The                            patient tolerated the procedure well. The quality                            of the bowel preparation was excellent. Scope In: 7:58:49 AM Scope Out: 8:27:49 AM Scope Withdrawal Time: 0 hours 18 minutes 22 seconds  Total Procedure Duration: 0 hours 29 minutes 0 seconds  Findings:      The perianal and digital rectal examinations were normal.      A 1 mm polyp was found in the ascending colon. The polyp was sessile.       The polyp was removed with a cold biopsy forceps. Resection and       retrieval were complete.      Five sessile polyps were found in the transverse colon and ascending       colon. The polyps were 3 to 10 mm in size. These polyps were removed       with a cold snare. Resection and retrieval were complete.      Two sessile polyps were found in the descending colon. The polyps were 3       to 5 mm in size. These polyps were removed with a cold snare. Resection       and retrieval were complete.      There was a medium-sized lipoma, at the hepatic flexure.      A few small-mouthed diverticula were found in the sigmoid colon and       ascending colon.      The retroflexed view of the distal rectum and anal verge was normal and       showed no anal or rectal abnormalities. Impression:               - One 1 mm polyp in the ascending colon, removed                            with a cold biopsy forceps. Resected and retrieved.                           -  Five 3 to 10 mm polyps in the transverse colon                            and in the ascending colon, removed with a cold                            snare. Resected and retrieved.                           - Two 3 to 5 mm polyps in the descending colon,                            removed with a cold snare. Resected and retrieved.                            - Medium-sized lipoma at the hepatic flexure.                           - Diverticulosis in the sigmoid colon and in the                            ascending colon.                           - The distal rectum and anal verge are normal on                            retroflexion view. Moderate Sedation:      Per Anesthesia Care Recommendation:           - Discharge patient to home (ambulatory).                           - Resume previous diet.                           - Await pathology results.                           - Repeat colonoscopy in 3 years for surveillance. Procedure Code(s):        --- Professional ---                           [id number], Colonoscopy, flexible; with removal of                            tumor(s), polyp(s), or other lesion(s) by snare                            technique                           45380, 59, Colonoscopy, flexible; with biopsy,                            single or  multiple Diagnosis Code(s):        --- Professional ---                           Z86.010, Personal history of colonic polyps                           D12.2, Benign neoplasm of ascending colon                           D12.3, Benign neoplasm of transverse colon (hepatic                            flexure or splenic flexure)                           D12.4, Benign neoplasm of descending colon                           D17.5, Benign lipomatous neoplasm of                            intra-abdominal organs                           K57.30, Diverticulosis of large intestine without                            perforation or abscess without bleeding CPT copyright 2022 American Medical Association. All rights reserved. The codes documented in this report are preliminary and upon coder review may  be revised to meet current compliance requirements. Toribio Fortune, MD Toribio Fortune,  05/05/2023 8:39:57 AM This report has been signed electronically. Number of Addenda: 0

## 2023-05-05 NOTE — Anesthesia Postprocedure Evaluation (Signed)
 Anesthesia Post Note  Patient: Yvette Curry  Procedure(s) Performed: COLONOSCOPY WITH PROPOFOL  ESOPHAGOGASTRODUODENOSCOPY (EGD) WITH PROPOFOL  SAVORY DILATION BIOPSY POLYPECTOMY INTESTINAL  Patient location during evaluation: PACU Anesthesia Type: General Level of consciousness: awake and alert Pain management: pain level controlled Vital Signs Assessment: post-procedure vital signs reviewed and stable Respiratory status: spontaneous breathing, nonlabored ventilation, respiratory function stable and patient connected to nasal cannula oxygen  Cardiovascular status: blood pressure returned to baseline and stable Postop Assessment: no apparent nausea or vomiting Anesthetic complications: no   There were no known notable events for this encounter.   Last Vitals:  Vitals:   05/05/23 0700 05/05/23 0831  BP:  108/68  Pulse: 86 78  Resp: 16 13  Temp:  (!) 36.1 C  SpO2: 99% 97%    Last Pain:  Vitals:   05/05/23 0831  TempSrc: Axillary  PainSc: 0-No pain                 Zyad Boomer L Huntleigh Doolen

## 2023-05-05 NOTE — Op Note (Addendum)
 [facility] Patient Name: Yvette Curry Procedure Date: 05/05/2023 7:06 AM MRN: [id number] Date of Birth: Oct 19, 1955 Attending MD: Toribio Fortune , , [id number] CSN: [id number] Age: 68 Admit Type: Outpatient Procedure:                Upper GI endoscopy Indications:              Dysphagia, Weight loss Providers:                Toribio Fortune, Crystal Page, Kristine L. Shirlean Balm, Technician Referring MD:              Medicines:                Monitored Anesthesia Care Complications:            No immediate complications. Estimated Blood Loss:     Estimated blood loss: none. Procedure:                Pre-Anesthesia Assessment:                           - Prior to the procedure, a History and Physical                            was performed, and patient medications, allergies                            and sensitivities were reviewed. The patient's                            tolerance of previous anesthesia was reviewed.                           - The risks and benefits of the procedure and the                            sedation options and risks were discussed with the                            patient. All questions were answered and informed                            consent was obtained.                           - ASA Grade Assessment: III - A patient with severe                            systemic disease.                           After obtaining informed consent, the endoscope was                            passed under direct vision. Throughout the  procedure, the patient's blood pressure, pulse, and                            oxygen  saturations were monitored continuously. The                            [id number]-H190 ([id number]) scope was introduced through the                            mouth, and advanced to the second part of duodenum.                            The upper GI endoscopy was accomplished without                             difficulty. The patient tolerated the procedure                            well. Scope In: 7:45:28 AM Scope Out: 7:54:16 AM Total Procedure Duration: 0 hours 8 minutes 48 seconds  Findings:      No endoscopic abnormality was evident in the esophagus to explain the       patient's complaint of dysphagia. It was decided, however, to proceed       with dilation of the entire esophagus. A guidewire was placed and the       scope was withdrawn. Dilation was performed with a Savary dilator with       mild resistance at 18 mm. The dilation site was examined following       endoscope reinsertion and showed mild mucosal disruption.      The entire examined stomach was normal. Biopsies were taken with a cold       forceps for Helicobacter pylori testing.      Patchy mild inflammation characterized by congestion (edema) and       erythema was found in the duodenal bulb. Biopsies from bulb and 2nd       portion were taken with a cold forceps for histology. Impression:               - No endoscopic esophageal abnormality to explain                            patient's dysphagia. Esophagus dilated.                           - Normal stomach. Biopsied.                           - Duodenitis. Biopsied. Moderate Sedation:      Per Anesthesia Care Recommendation:           - Discharge patient to home (ambulatory).                           - Resume previous diet.                           - Await pathology results.                           -  Continue present medications. Procedure Code(s):        --- Professional ---                           [phone], Esophagogastroduodenoscopy, flexible,                            transoral; with insertion of guide wire followed by                            passage of dilator(s) through esophagus over guide                            wire                           [id number], 59, Esophagogastroduodenoscopy, flexible,                             transoral; with biopsy, single or multiple Diagnosis Code(s):        --- Professional ---                           R13.10, Dysphagia, unspecified                           K29.80, Duodenitis without bleeding                           R63.4, Abnormal weight loss CPT copyright 2022 [employer]. All rights reserved. The codes documented in this report are preliminary and upon coder review may  be revised to meet current compliance requirements. Toribio Fortune, MD Toribio Fortune,  05/05/2023 7:58:08 AM This report has been signed electronically. Number of Addenda: 0

## 2023-05-05 NOTE — Anesthesia Procedure Notes (Signed)
 Date/Time: 05/05/2023 7:40 AM  Performed by: Eliodoro Deward FALCON, CRNAPre-anesthesia Checklist: Patient identified, Emergency Drugs available, Suction available and Patient being monitored Patient Re-evaluated:Patient Re-evaluated prior to induction Oxygen  Delivery Method: Nasal cannula Induction Type: IV induction Placement Confirmation: positive ETCO2 Comments: Optiflow High Flow [place] O2 used.

## 2023-05-05 NOTE — Transfer of Care (Signed)
 Immediate Anesthesia Transfer of Care Note  Patient: Yvette Curry  Procedure(s) Performed: COLONOSCOPY WITH PROPOFOL  ESOPHAGOGASTRODUODENOSCOPY (EGD) WITH PROPOFOL  SAVORY DILATION BIOPSY POLYPECTOMY INTESTINAL  Patient Location: Short Stay  Anesthesia Type:General  Level of Consciousness: drowsy  Airway & Oxygen  Therapy: Patient Spontanous Breathing  Post-op Assessment: Report given to RN and Post -op Vital signs reviewed and stable  Post vital signs: Reviewed and stable  Last Vitals:  Vitals Value Taken Time  BP    Temp    Pulse    Resp    SpO2      Last Pain:  Vitals:   05/05/23 0741  PainSc: 0-No pain         Complications: No notable events documented.
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
## 2023-05-06 ENCOUNTER — Encounter (INDEPENDENT_AMBULATORY_CARE_PROVIDER_SITE_OTHER): Payer: Self-pay | Admitting: *Deleted

## 2023-05-06 ENCOUNTER — Encounter: Payer: Self-pay | Admitting: *Deleted

## 2023-05-06 LAB — SURGICAL PATHOLOGY

## 2023-05-07 ENCOUNTER — Encounter (HOSPITAL_COMMUNITY): Payer: Self-pay | Admitting: Gastroenterology

## 2023-05-08 ENCOUNTER — Encounter (INDEPENDENT_AMBULATORY_CARE_PROVIDER_SITE_OTHER): Payer: Self-pay

## 2023-05-08 ENCOUNTER — Encounter (INDEPENDENT_AMBULATORY_CARE_PROVIDER_SITE_OTHER): Payer: Medicare Other | Admitting: Ophthalmology

## 2023-05-08 DIAGNOSIS — H353211 Exudative age-related macular degeneration, right eye, with active choroidal neovascularization: Secondary | ICD-10-CM

## 2023-05-08 DIAGNOSIS — H04123 Dry eye syndrome of bilateral lacrimal glands: Secondary | ICD-10-CM

## 2023-05-08 DIAGNOSIS — Z961 Presence of intraocular lens: Secondary | ICD-10-CM

## 2023-05-08 DIAGNOSIS — C3491 Malignant neoplasm of unspecified part of right bronchus or lung: Secondary | ICD-10-CM

## 2023-05-08 DIAGNOSIS — H35711 Central serous chorioretinopathy, right eye: Secondary | ICD-10-CM
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
## 2023-05-11 ENCOUNTER — Ambulatory Visit (INDEPENDENT_AMBULATORY_CARE_PROVIDER_SITE_OTHER): Payer: Medicare Other | Admitting: Ophthalmology

## 2023-05-11 ENCOUNTER — Encounter (INDEPENDENT_AMBULATORY_CARE_PROVIDER_SITE_OTHER): Payer: Self-pay | Admitting: Ophthalmology

## 2023-05-11 DIAGNOSIS — H353211 Exudative age-related macular degeneration, right eye, with active choroidal neovascularization: Secondary | ICD-10-CM

## 2023-05-11 DIAGNOSIS — H04123 Dry eye syndrome of bilateral lacrimal glands: Secondary | ICD-10-CM | POA: Diagnosis not present

## 2023-05-11 DIAGNOSIS — C3491 Malignant neoplasm of unspecified part of right bronchus or lung: Secondary | ICD-10-CM

## 2023-05-11 DIAGNOSIS — Z961 Presence of intraocular lens: Secondary | ICD-10-CM

## 2023-05-11 DIAGNOSIS — H35711 Central serous chorioretinopathy, right eye: Secondary | ICD-10-CM | POA: Diagnosis not present

## 2023-05-11 MED ORDER — BEVACIZUMAB CHEMO INJECTION 1.25MG/0.05ML SYRINGE FOR KALEIDOSCOPE
1.2500 mg | INTRAVITREAL | Status: AC | PRN
  Administered 2023-05-11: 1.25 mg via INTRAVITREAL
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
## 2023-05-12 ENCOUNTER — Encounter: Payer: Self-pay | Admitting: Internal Medicine

## 2023-05-12 ENCOUNTER — Telehealth: Payer: Self-pay
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
## 2023-05-12 NOTE — Telephone Encounter (Signed)
Spoke with patient and insurance has approved treatment.  Patient verbalized thanks.

## 2023-05-12 NOTE — Telephone Encounter (Signed)
Informed patient that Cassie, PA received a message from insurance requesting a peer to peer. Informed patient a call will be made by the end of the day informing her on next steps.

## 2023-05-12 NOTE — Progress Notes (Unsigned)
Palliative Medicine [facility]  Telephone:([phone]) [phone] Fax:([phone]) [phone]   Name: Yvette Curry Date: 05/12/2023 MRN: [id number]  DOB: 06-30-1955  Patient Care Team: [name], DO as PCP - General (Family Medicine) Mallipeddi, Orion Modest, MD as PCP - Cardiology (Cardiology) Crista Elliot, MD as Consulting Physician (Urology) Pickenpack-Cousar, Arty Baumgartner, NP as Nurse Practitioner (Hospice and Palliative Medicine)    INTERVAL HISTORY: Yvette Curry is a 68 y.o. 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 Palliative ask to see for symptom management and goals of care.   SOCIAL HISTORY:     reports that she has been smoking cigarettes. She has a 18.5 pack-year smoking history. She has been exposed to tobacco smoke. She has never used smokeless tobacco. She reports that she does not drink alcohol and does not use drugs.  ADVANCE DIRECTIVES:  None on file  CODE STATUS: Full code  PAST MEDICAL HISTORY: Past Medical History:  Diagnosis Date  . Anxiety    no current tx  . Depression    no meds at present  . Dyspnea   . Family history of adverse reaction to anesthesia    pt states mom had allergic reaction to some unknown anesthesia  . GERD (gastroesophageal reflux disease)    no tx since weight loss  . Hypercholesteremia   . Osteoarthritis   . stage IV lung ca 11/2021    ALLERGIES:  is allergic to lidocaine, mepivacaine, demerol, prednisone, and sulfa antibiotics.  MEDICATIONS:  Current Outpatient Medications  Medication Sig Dispense Refill  . acetaminophen (TYLENOL) 500 MG tablet Take 500 mg by mouth as needed for mild pain (pain score 1-3), moderate pain (pain score 4-6) or fever.    Marland Kitchen albuterol (VENTOLIN HFA) 108 (90 Base) MCG/ACT inhaler Inhale 2 puffs into  the lungs every 6 (six) hours as needed for wheezing or shortness of breath. 8 g 2  . ascorbic acid (VITAMIN C) 500 MG tablet Take 1,000 mg by mouth daily.    . Blood Pressure Monitoring (OMRON 3 SERIES BP MONITOR) DEVI Use as directed 1 each 1  . Budeson-Glycopyrrol-Formoterol (BREZTRI AEROSPHERE) 160-9-4.8 MCG/ACT AERO Inhale 2 puffs into the lungs in the morning and at bedtime. 10.7 g 3  . carboxymethylcellulose 1 % ophthalmic solution Apply 1-2 drops to eye as needed (dry eye).    . Cholecalciferol (VITAMIN D) 50 MCG (2000 UT) CAPS Take by mouth.    . ferrous sulfate 324 MG TBEC Take 1 tablet (324 mg total) by mouth daily with breakfast. 30 tablet 4  . folic acid (FOLVITE) 1 MG tablet Take 1 tablet (1 mg total) by mouth daily. 90 tablet 1  . gabapentin (NEURONTIN) 100 MG capsule Take 2 capsules (200 mg total) by mouth 2 (two) times daily. (Patient taking differently: Take 100 mg by mouth 2 (two) times daily.) 360 capsule 1  . lidocaine (LMX) 4 % cream Apply topically 3 (three) times daily as needed (leg pain). (Patient taking differently: Apply 1 Application topically as needed (leg pain).) 30 g 0  . midodrine (PROAMATINE) 5 MG tablet Take 1 tablet (5 mg total) by mouth 3 (three) times daily as needed (If your Systolic Blood Pressure is less than 90). 90 tablet 1  . pantoprazole (PROTONIX) 40 MG tablet Take 40 mg by mouth daily.    Marland Kitchen  Probiotic Product (PROBIOTIC PO) Take 1 capsule by mouth daily.    . prochlorperazine (COMPAZINE) 10 MG tablet Take 10 mg by mouth as needed for nausea or vomiting. Every 3 weeks before chemo    . Zinc 10 MG LOZG Use as directed in the mouth or throat.     No current facility-administered medications for this visit.    VITAL SIGNS: There were no vitals taken for this visit. There were no vitals filed for this visit.  Estimated body mass index is 27.7 kg/m as calculated from the following:   Height as of 04/07/23: 5\' 1"  (1.549 m).   Weight as of 04/23/23: 146  lb 9.6 oz (66.5 kg).   PERFORMANCE STATUS (ECOG) : 1 - Symptomatic but completely ambulatory   Physical Exam General: NAD Cardiovascular: regular rate and rhythm Pulmonary: normal breathing pattern  Extremities: left lower edema, no joint deformities Skin: no rashes Neurological: AAO x3  Discussed the use of AI scribe software for clinical note transcription with the patient, who gave verbal consent to proceed.  IMPRESSION:  Yvette Curry presents to clinic today for follow-up. No acute distress. Her husband is present. Patient recently hospitalized due to Sepsis requiring ICU stay.  She reports a significant loss of strength following the hospitalization, which she has been working to regain. She also reports a recent decision with Oncologist to hold chemotherapy due to concerns about side effects, including fluid behind the retina and recurrent cellulitis/infections. Despite recent health challenges thankfully she and her husband recently celebrated several memorable milestones in their life: 40 years of marriage, 41 years of relationship/first date.   The patient's appetite has improved recently, with an ability to finish meals without needing to take leftovers home. Snacking in between meals. Enjoying [employer]. She reports daily bowel movements, though not of the usual consistency. She denies straining or discomfort during bowel movements. She also denies nausea, vomiting, headaches, and dizziness, though she does report occasional lightheadedness when lying down or standing up. She knows to continue to closely monitor and report any significant changes or seek medical attention immediately.   Yvette Curry has been managing her neuropathic pain with gabapentin, taken twice daily. She reports that the pain has improved to the point where she can tolerate having lotion applied to her legs by her husband. She also reports a recent sprain to her ankle, which has been causing intermittent  pain and swelling. She is scheduled to follow up with an orthopedic surgeon for this issue. Some scaly dryness to her right leg affected by cellulitis. She is wearing compression socks.   The patient's spouse is heavily involved in her care and has been encouraging her. The patient acknowledges the importance of his role and support throughout this journey.  Patient and husband knows I am available as needed for support.  I discussed the importance of continued conversation with family and their medical providers regarding overall plan of care and treatment options, ensuring decisions are within the context of the patients values and GOCs.  PLAN: Assessment & Plan Cancer Treatment Recent hospitalization for sepsis secondary to cellulitis. Chemotherapy has been held for the next three rounds due to concerns of it causing infectious reactions and other side effects per Oncology. Patient is currently on Keytruda treatment. -Continue Keytruda treatment as planned per Dr. Arbutus Ped.   Hypotension Recent episode of hypotension in the setting of sepsis causing lightheadedness. Patient was advised to seek immediate medical attention if this occurs again. -Advise patient to  seek immediate medical attention if blood pressure drops significantly or if lightheadedness occurs.  Constipation Patient reports daily bowel movements, but not of the usual consistency. Currently taking Colace and has Miralax and Milk of Magnesia available if needed. -Continue current bowel regimen as needed.  Neuropathy Patient reports improvement in neuropathic pain and is currently taking Gabapentin 100mg  twice daily. -Continue Gabapentin 100mg  twice daily. May increase to three times daily if tolerated.   Ankle Sprain Patient reports intermittent pain and swelling in the ankle. She has a follow-up appointment with the orthopedic surgeon. -Advise patient to continue icing and elevating the ankle as needed. Follow up with  orthopedic surgeon as planned.  General Health Maintenance / Followup Plans -Continue to monitor appetite and encourage nutritional intake. -Encourage patient to maintain hydration, especially while on Lasix. -Check in with patient to ensure things are going well. - Patient knows to contact office as needed. Patient expressed understanding and was in agreement with this plan. She also understands that She can call the clinic at any time with any questions, concerns, or complaints.   Any controlled substances utilized were prescribed in the context of palliative care. PDMP has been reviewed.    Visit consisted of counseling and education dealing with the complex and emotionally intense issues of symptom management and palliative care in the setting of serious and potentially life-threatening illness.  Willette Alma, AGPCNP-BC  Palliative Medicine Team/[facility]

## 2023-05-12 NOTE — Telephone Encounter (Signed)
Yvette Curry, please reach patient about arranging follow-up for dysphagia.

## 2023-05-13 ENCOUNTER — Inpatient Hospital Stay: Payer: Medicare Other

## 2023-05-13 ENCOUNTER — Encounter: Payer: Self-pay | Admitting: Nurse Practitioner

## 2023-05-13 ENCOUNTER — Inpatient Hospital Stay (HOSPITAL_BASED_OUTPATIENT_CLINIC_OR_DEPARTMENT_OTHER): Payer: Medicare Other | Admitting: Physician Assistant

## 2023-05-13 ENCOUNTER — Inpatient Hospital Stay (HOSPITAL_BASED_OUTPATIENT_CLINIC_OR_DEPARTMENT_OTHER): Payer: Medicare Other | Admitting: Nurse Practitioner

## 2023-05-13 VITALS — BP 124/86 | HR 88 | Temp 98.2°F | Resp 16 | Wt 146.6 lb

## 2023-05-13 DIAGNOSIS — C3491 Malignant neoplasm of unspecified part of right bronchus or lung: Secondary | ICD-10-CM

## 2023-05-13 DIAGNOSIS — Z515 Encounter for palliative care: Secondary | ICD-10-CM

## 2023-05-13 DIAGNOSIS — Z7189 Other specified counseling: Secondary | ICD-10-CM

## 2023-05-13 DIAGNOSIS — M7989 Other specified soft tissue disorders: Secondary | ICD-10-CM | POA: Diagnosis not present

## 2023-05-13 DIAGNOSIS — Z5112 Encounter for antineoplastic immunotherapy: Secondary | ICD-10-CM

## 2023-05-13 LAB — CMP (CANCER CENTER ONLY)
ALT: 11 U/L (ref 0–44)
AST: 18 U/L (ref 15–41)
Albumin: 3.6 g/dL (ref 3.5–5.0)
Alkaline Phosphatase: 62 U/L (ref 38–126)
Anion gap: 4 — ABNORMAL LOW (ref 5–15)
BUN: 22 mg/dL (ref 8–23)
CO2: 32 mmol/L (ref 22–32)
Calcium: 9.9 mg/dL (ref 8.9–10.3)
Chloride: 105 mmol/L (ref 98–111)
Creatinine: 1.26 mg/dL — ABNORMAL HIGH (ref 0.44–1.00)
GFR, Estimated: 47 mL/min — ABNORMAL LOW (ref >60)
Glucose, Bld: 87 mg/dL (ref 70–99)
Potassium: 4.2 mmol/L (ref 3.5–5.1)
Sodium: 141 mmol/L (ref 135–145)
Total Bilirubin: 0.3 mg/dL (ref 0.0–1.2)
Total Protein: 6.7 g/dL (ref 6.5–8.1)

## 2023-05-13 LAB — CBC WITH DIFFERENTIAL (CANCER CENTER ONLY)
Abs Immature Granulocytes: 0.02 10*3/uL (ref 0.00–0.07)
Basophils Absolute: 0.1 10*3/uL (ref 0.0–0.1)
Basophils Relative: 1 %
Eosinophils Absolute: 0.3 10*3/uL (ref 0.0–0.5)
Eosinophils Relative: 4 %
HCT: 33.9 % — ABNORMAL LOW (ref 36.0–46.0)
Hemoglobin: 11.6 g/dL — ABNORMAL LOW (ref 12.0–15.0)
Immature Granulocytes: 0 %
Lymphocytes Relative: 29 %
Lymphs Abs: 1.9 10*3/uL (ref 0.7–4.0)
MCH: 34.7 pg — ABNORMAL HIGH (ref 26.0–34.0)
MCHC: 34.2 g/dL (ref 30.0–36.0)
MCV: 101.5 fL — ABNORMAL HIGH (ref 80.0–100.0)
Monocytes Absolute: 0.5 10*3/uL (ref 0.1–1.0)
Monocytes Relative: 8 %
Neutro Abs: 3.6 10*3/uL (ref 1.7–7.7)
Neutrophils Relative %: 58 %
Platelet Count: 188 10*3/uL (ref 150–400)
RBC: 3.34 MIL/uL — ABNORMAL LOW (ref 3.87–5.11)
RDW: 13.2 % (ref 11.5–15.5)
WBC Count: 6.4 10*3/uL (ref 4.0–10.5)
nRBC: 0 % (ref 0.0–0.2)

## 2023-05-13 MED ORDER — SODIUM CHLORIDE 0.9 % IV SOLN
200.0000 mg | Freq: Once | INTRAVENOUS | Status: AC
  Administered 2023-05-13: 200 mg via INTRAVENOUS
  Filled 2023-05-13: qty 200

## 2023-05-13 MED ORDER — SODIUM CHLORIDE 0.9 % IV SOLN: Freq: Once | INTRAVENOUS | Status: AC

## 2023-05-13 MED ORDER — HEPARIN SOD (PORK) LOCK FLUSH 100 UNIT/ML IV SOLN
500.0000 [IU] | Freq: Once | INTRAVENOUS | Status: AC | PRN
  Administered 2023-05-13: 500 [IU]

## 2023-05-13 MED ORDER — SODIUM CHLORIDE 0.9% FLUSH
10.0000 mL | INTRAVENOUS | Status: DC | PRN
  Administered 2023-05-13: 10 mL

## 2023-05-13 NOTE — Patient Instructions (Signed)

## 2023-05-14 ENCOUNTER — Ambulatory Visit (INDEPENDENT_AMBULATORY_CARE_PROVIDER_SITE_OTHER): Payer: Medicare Other | Admitting: Gastroenterology

## 2023-05-14 ENCOUNTER — Encounter: Payer: Self-pay | Admitting: *Deleted

## 2023-05-14 ENCOUNTER — Encounter (INDEPENDENT_AMBULATORY_CARE_PROVIDER_SITE_OTHER): Payer: Self-pay | Admitting: *Deleted

## 2023-05-14 ENCOUNTER — Encounter: Payer: Self-pay | Admitting: Gastroenterology

## 2023-05-14 VITALS — BP 152/86 | HR 88 | Temp 97.9°F | Ht 61.0 in | Wt 147.2 lb

## 2023-05-14 DIAGNOSIS — R1310 Dysphagia, unspecified: Secondary | ICD-10-CM | POA: Insufficient documentation

## 2023-05-14 DIAGNOSIS — R09A2 Foreign body sensation, throat: Secondary | ICD-10-CM

## 2023-05-14 DIAGNOSIS — K219 Gastro-esophageal reflux disease without esophagitis: Secondary | ICD-10-CM | POA: Diagnosis not present

## 2023-05-14 MED ORDER — SUCRALFATE 1 G PO TABS: 1.0000 g | ORAL_TABLET | Freq: Three times a day (TID) | ORAL | 0 refills | Status: DC

## 2023-05-14 NOTE — Patient Instructions (Signed)
We will get you scheduled for a swallow study at [facility].  Start Carafate 1 g 3 times daily before meals and at bedtime.  If you are not eating regularly, you can just space this out every 6 hours.  Continue pantoprazole 40 mg daily.  I will see you back in 4 weeks or sooner if needed.  [name], PA-C [facility] Gastroenterology

## 2023-05-18 ENCOUNTER — Telehealth: Payer: Self-pay

## 2023-05-18 DIAGNOSIS — C3491 Malignant neoplasm of unspecified part of right bronchus or lung: Secondary | ICD-10-CM

## 2023-05-18 DIAGNOSIS — M7989 Other specified soft tissue disorders: Secondary | ICD-10-CM

## 2023-05-18 DIAGNOSIS — R530 Neoplastic (malignant) related fatigue: Secondary | ICD-10-CM

## 2023-05-18 DIAGNOSIS — Z515 Encounter for palliative care: Secondary | ICD-10-CM

## 2023-05-18 DIAGNOSIS — M792 Neuralgia and neuritis, unspecified: Secondary | ICD-10-CM

## 2023-05-18 DIAGNOSIS — Z7189 Other specified counseling: Secondary | ICD-10-CM

## 2023-05-18 DIAGNOSIS — G893 Neoplasm related pain (acute) (chronic): Secondary | ICD-10-CM

## 2023-05-18 NOTE — Telephone Encounter (Signed)
pt was following up on a referral lymphedema clinic for her leg swelling, order placed per Lowella Bandy, NP. No further needs at this time.

## 2023-05-20 ENCOUNTER — Ambulatory Visit (INDEPENDENT_AMBULATORY_CARE_PROVIDER_SITE_OTHER): Payer: Medicare Other | Admitting: Family Medicine

## 2023-05-20 VITALS — BP 142/88 | HR 91 | Temp 97.9°F | Ht 61.0 in | Wt 147.0 lb

## 2023-05-20 DIAGNOSIS — R600 Localized edema: Secondary | ICD-10-CM

## 2023-05-20 DIAGNOSIS — Z720 Tobacco use: Secondary | ICD-10-CM | POA: Diagnosis not present

## 2023-05-20 DIAGNOSIS — C3491 Malignant neoplasm of unspecified part of right bronchus or lung: Secondary | ICD-10-CM | POA: Diagnosis not present

## 2023-05-20 DIAGNOSIS — J90 Pleural effusion, not elsewhere classified: Secondary | ICD-10-CM

## 2023-05-20 DIAGNOSIS — K219 Gastro-esophageal reflux disease without esophagitis: Secondary | ICD-10-CM | POA: Diagnosis not present

## 2023-05-20 NOTE — Patient Instructions (Signed)
Continue your medications.  Message with concerns.  Follow up in 3 months.

## 2023-05-21 DIAGNOSIS — R600 Localized edema: Secondary | ICD-10-CM | POA: Insufficient documentation

## 2023-05-21 NOTE — Assessment & Plan Note (Signed)
Has upcoming CT scan.

## 2023-05-21 NOTE — Progress Notes (Signed)
Subjective:  Patient ID: Yvette Curry, female    DOB: Mar 10, 1956  Age: 68 y.o. MRN: [id number]  CC:   Chief Complaint  Patient presents with   3 month follow up    HPI:  68 year old female with an extensive past medical history including adenoma carcinoma of the right lung, stage IV presents for follow-up.  Patient states that overall she is doing okay at this time.  She does have an ongoing cough.  She has underlying emphysema.  Cough is nonproductive.  No fever.  No increasing shortness of breath.  Patient reports that she has an upcoming CT scan for reevaluation regarding her cancer.  No hypertension at this time.  Appetite is good.  She has had issues with recurrent cellulitis.  She has ongoing swelling particularly of the left lower extremity.  Has an upcoming visit with lymphedema clinic.  No concerns for cellulitis at this time.  Recently has seen GI.  He is now on Carafate and overall improving regarding dysphagia.  Patient Active Problem List   Diagnosis Date Noted   Lower extremity edema 05/21/2023   Dysphagia 05/14/2023   Gastroesophageal reflux disease 05/14/2023   Pericardial effusion 04/10/2023   Bilateral pleural effusion 03/30/2023   Acute on chronic combined systolic (congestive) and diastolic (congestive) heart failure (HCC) 03/15/2023   Encounter for antineoplastic immunotherapy 03/11/2023   CAD (coronary artery disease) 02/17/2023   Ascending aorta dilation (HCC) 02/17/2023   Macrocytic anemia 12/10/2022   Loss of weight 12/10/2022   Pancytopenia (HCC) 10/20/2022   Heart failure with improved ejection fraction (HFimpEF) (HCC) 07/01/2022   Aortic atherosclerosis (HCC) 06/04/2022   Emphysema lung (HCC) 06/04/2022   Neutropenia (HCC) 03/27/2022   Encounter for antineoplastic chemotherapy 03/19/2022   Adenocarcinoma of right lung, stage 4 (HCC) 12/12/2021   Tobacco abuse 11/06/2014    Social Hx   Social History   Socioeconomic History   Marital  status: Married    Spouse name: Adela Lank   Number of children: Y   Years of education: Not on file   Highest education level: 12th grade  Occupational History   Occupation: Personnel officer: UNEMPLOYED  Tobacco Use   Smoking status: Every Day    Current packs/day: 0.50    Average packs/day: 0.5 packs/day for 37.0 years (18.5 ttl pk-yrs)    Types: Cigarettes    Passive exposure: Current   Smokeless tobacco: Never   Tobacco comments:    Smokes half a pack of cigarettes a day. 12/18/2022 Tay  Vaping Use   Vaping status: Former  Substance and Sexual Activity   Alcohol use: No   Drug use: No   Sexual activity: Yes    Comment: hysterectomy  Other Topics Concern   Not on file  Social History Narrative   Not on file   Social Drivers of Health   Financial Resource Strain: Low Risk  (05/18/2023)   Overall Financial Resource Strain (CARDIA)    Difficulty of Paying Living Expenses: Not hard at all  Food Insecurity: No Food Insecurity (05/18/2023)   Hunger Vital Sign    Worried About Running Out of Food in the Last Year: Never true    Ran Out of Food in the Last Year: Never true  Transportation Needs: No Transportation Needs (05/18/2023)   PRAPARE - Administrator, Civil Service (Medical): No    Lack of Transportation (Non-Medical): No  Physical Activity: Unknown (05/18/2023)   Exercise Vital Sign    Days of  Exercise per Week: 0 days    Minutes of Exercise per Session: Not on file  Stress: No Stress Concern Present (05/18/2023)   [employer] of Occupational Health - Occupational Stress Questionnaire    Feeling of Stress : Only a little  Social Connections: Moderately Isolated (05/18/2023)   Social Connection and Isolation Panel [NHANES]    Frequency of Communication with Friends and Family: More than three times a week    Frequency of Social Gatherings with Friends and Family: Never    Attends Religious Services: Never    Diplomatic Services operational officer: No     Attends Engineer, structural: Not on file    Marital Status: Married    Review of Systems Per HPI  Objective:  BP (!) 142/88   Pulse 91   Temp 97.9 F (36.6 C)   Ht 5\' 1"  (1.549 m)   Wt 147 lb (66.7 kg)   SpO2 96%   BMI 27.78 kg/m      05/20/2023    1:29 PM 05/14/2023    2:17 PM 05/14/2023    1:40 PM  BP/Weight  Systolic BP 142 152 160  Diastolic BP 88 86 87  Wt. (Lbs) 147    BMI 27.78 kg/m2      Physical Exam Vitals and nursing note reviewed.  Constitutional:      General: She is not in acute distress.    Appearance: Normal appearance.  HENT:     Head: Normocephalic and atraumatic.  Eyes:     General:        Right eye: No discharge.        Left eye: No discharge.     Conjunctiva/sclera: Conjunctivae normal.  Cardiovascular:     Rate and Rhythm: Normal rate and regular rhythm.  Pulmonary:     Effort: Pulmonary effort is normal.     Breath sounds: No wheezing.  Abdominal:     General: There is no distension.     Palpations: Abdomen is soft.     Tenderness: There is no abdominal tenderness.  Neurological:     Mental Status: She is alert.  Psychiatric:        Mood and Affect: Mood normal.        Behavior: Behavior normal.     Lab Results  Component Value Date   WBC 6.4 05/13/2023   HGB 11.6 (L) 05/13/2023   HCT 33.9 (L) 05/13/2023   PLT 188 05/13/2023   GLUCOSE 87 05/13/2023   ALT 11 05/13/2023   AST 18 05/13/2023   NA 141 05/13/2023   K 4.2 05/13/2023   CL 105 05/13/2023   CREATININE 1.26 (H) 05/13/2023   BUN 22 05/13/2023   CO2 32 05/13/2023   TSH 2.409 04/23/2023   INR 1.0 03/15/2023     Assessment & Plan:   Problem List Items Addressed This Visit       Respiratory   Adenocarcinoma of right lung, stage 4 (HCC) - Primary (Chronic)   Has upcoming CT scan.      Bilateral pleural effusion   No evidence of exacerbation at this time.        Digestive   Gastroesophageal reflux disease   Stable at this time on Protonix  and Carafate.  Continue.        Other   Tobacco abuse   Patient continues to smoke.      Lower extremity edema   Stable.  Has upcoming visit with lymphedema clinic.  Follow-up:  3 months  Total 30 minutes spent with the patient at discussing ongoing medical issues, plans of care, medications, recent specialist visits.  Everlene Other DO [facility]

## 2023-05-21 NOTE — Assessment & Plan Note (Signed)
Patient continues to smoke

## 2023-05-21 NOTE — Assessment & Plan Note (Signed)
Stable at this time on Protonix and Carafate.  Continue.

## 2023-05-21 NOTE — Assessment & Plan Note (Signed)
No evidence of exacerbation at this time

## 2023-05-21 NOTE — Assessment & Plan Note (Signed)
Stable.  Has upcoming visit with lymphedema clinic.

## 2023-05-23 ENCOUNTER — Ambulatory Visit (HOSPITAL_COMMUNITY)
Admission: RE | Admit: 2023-05-23 | Discharge: 2023-05-23 | Disposition: A | Discharge status: Home / Self Care | Payer: Medicare Other | Source: Ambulatory Visit | Attending: Physician Assistant | Admitting: Physician Assistant

## 2023-05-23 DIAGNOSIS — C3491 Malignant neoplasm of unspecified part of right bronchus or lung: Secondary | ICD-10-CM | POA: Diagnosis present

## 2023-05-23 MED ORDER — IOHEXOL 300 MG/ML  SOLN
100.0000 mL | Freq: Once | INTRAMUSCULAR | Status: AC | PRN
  Administered 2023-05-23: 100 mL via INTRAVENOUS

## 2023-05-23 MED ORDER — HEPARIN SOD (PORK) LOCK FLUSH 100 UNIT/ML IV SOLN
INTRAVENOUS | Status: AC
  Filled 2023-05-23: qty 5

## 2023-05-25 ENCOUNTER — Ambulatory Visit (HOSPITAL_COMMUNITY)
Admission: RE | Admit: 2023-05-25 | Discharge: 2023-05-25 | Disposition: A | Discharge status: Home / Self Care | Payer: Medicare Other | Source: Ambulatory Visit | Attending: Gastroenterology | Admitting: Gastroenterology

## 2023-05-25 DIAGNOSIS — R1310 Dysphagia, unspecified: Secondary | ICD-10-CM | POA: Diagnosis present

## 2023-05-28 ENCOUNTER — Ambulatory Visit: Payer: Medicare Other | Attending: Nurse Practitioner | Admitting: Physical Therapy

## 2023-05-28 DIAGNOSIS — M7989 Other specified soft tissue disorders: Secondary | ICD-10-CM | POA: Insufficient documentation

## 2023-05-28 DIAGNOSIS — Z515 Encounter for palliative care: Secondary | ICD-10-CM | POA: Insufficient documentation

## 2023-05-28 DIAGNOSIS — I890 Lymphedema, not elsewhere classified: Secondary | ICD-10-CM | POA: Diagnosis not present

## 2023-05-28 DIAGNOSIS — G893 Neoplasm related pain (acute) (chronic): Secondary | ICD-10-CM | POA: Insufficient documentation

## 2023-05-28 DIAGNOSIS — M792 Neuralgia and neuritis, unspecified: Secondary | ICD-10-CM | POA: Diagnosis not present

## 2023-05-28 DIAGNOSIS — C3491 Malignant neoplasm of unspecified part of right bronchus or lung: Secondary | ICD-10-CM | POA: Diagnosis present

## 2023-05-28 DIAGNOSIS — R530 Neoplastic (malignant) related fatigue: Secondary | ICD-10-CM | POA: Diagnosis not present
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
## 2023-06-03 ENCOUNTER — Inpatient Hospital Stay (HOSPITAL_BASED_OUTPATIENT_CLINIC_OR_DEPARTMENT_OTHER): Payer: Medicare Other | Admitting: Internal Medicine

## 2023-06-03 ENCOUNTER — Inpatient Hospital Stay: Payer: Medicare Other | Attending: Internal Medicine

## 2023-06-03 ENCOUNTER — Inpatient Hospital Stay: Payer: Medicare Other

## 2023-06-03 VITALS — BP 134/74 | HR 83 | Temp 98.0°F | Resp 18

## 2023-06-03 VITALS — BP 137/88 | HR 88 | Temp 98.2°F | Resp 18 | Ht 61.0 in | Wt 145.9 lb

## 2023-06-03 DIAGNOSIS — C3491 Malignant neoplasm of unspecified part of right bronchus or lung: Secondary | ICD-10-CM

## 2023-06-03 DIAGNOSIS — Z5112 Encounter for antineoplastic immunotherapy: Secondary | ICD-10-CM | POA: Diagnosis present

## 2023-06-03 DIAGNOSIS — C778 Secondary and unspecified malignant neoplasm of lymph nodes of multiple regions: Secondary | ICD-10-CM | POA: Insufficient documentation

## 2023-06-03 DIAGNOSIS — Z79899 Other long term (current) drug therapy: Secondary | ICD-10-CM | POA: Insufficient documentation

## 2023-06-03 DIAGNOSIS — T451X5A Adverse effect of antineoplastic and immunosuppressive drugs, initial encounter: Secondary | ICD-10-CM

## 2023-06-03 DIAGNOSIS — C3490 Malignant neoplasm of unspecified part of unspecified bronchus or lung: Secondary | ICD-10-CM

## 2023-06-03 DIAGNOSIS — C3411 Malignant neoplasm of upper lobe, right bronchus or lung: Secondary | ICD-10-CM | POA: Diagnosis present

## 2023-06-03 LAB — CBC WITH DIFFERENTIAL (CANCER CENTER ONLY)
Abs Immature Granulocytes: 0.02 10*3/uL (ref 0.00–0.07)
Basophils Absolute: 0.1 10*3/uL (ref 0.0–0.1)
Basophils Relative: 1 %
Eosinophils Absolute: 0.2 10*3/uL (ref 0.0–0.5)
Eosinophils Relative: 4 %
HCT: 37.8 % (ref 36.0–46.0)
Hemoglobin: 12.5 g/dL (ref 12.0–15.0)
Immature Granulocytes: 0 %
Lymphocytes Relative: 40 %
Lymphs Abs: 2.3 10*3/uL (ref 0.7–4.0)
MCH: 34 pg (ref 26.0–34.0)
MCHC: 33.1 g/dL (ref 30.0–36.0)
MCV: 102.7 fL — ABNORMAL HIGH (ref 80.0–100.0)
Monocytes Absolute: 0.5 10*3/uL (ref 0.1–1.0)
Monocytes Relative: 9 %
Neutro Abs: 2.7 10*3/uL (ref 1.7–7.7)
Neutrophils Relative %: 46 %
Platelet Count: 203 10*3/uL (ref 150–400)
RBC: 3.68 MIL/uL — ABNORMAL LOW (ref 3.87–5.11)
RDW: 13.2 % (ref 11.5–15.5)
WBC Count: 5.8 10*3/uL (ref 4.0–10.5)
nRBC: 0 % (ref 0.0–0.2)

## 2023-06-03 LAB — CMP (CANCER CENTER ONLY)
ALT: 13 U/L (ref 0–44)
AST: 17 U/L (ref 15–41)
Albumin: 3.7 g/dL (ref 3.5–5.0)
Alkaline Phosphatase: 65 U/L (ref 38–126)
Anion gap: 4 — ABNORMAL LOW (ref 5–15)
BUN: 23 mg/dL (ref 8–23)
CO2: 31 mmol/L (ref 22–32)
Calcium: 10.1 mg/dL (ref 8.9–10.3)
Chloride: 106 mmol/L (ref 98–111)
Creatinine: 1.18 mg/dL — ABNORMAL HIGH (ref 0.44–1.00)
GFR, Estimated: 51 mL/min — ABNORMAL LOW (ref >60)
Glucose, Bld: 100 mg/dL — ABNORMAL HIGH (ref 70–99)
Potassium: 4.2 mmol/L (ref 3.5–5.1)
Sodium: 141 mmol/L (ref 135–145)
Total Bilirubin: 0.3 mg/dL (ref 0.0–1.2)
Total Protein: 6.9 g/dL (ref 6.5–8.1)

## 2023-06-03 MED ORDER — SODIUM CHLORIDE 0.9 % IV SOLN: Freq: Once | INTRAVENOUS | Status: AC

## 2023-06-03 MED ORDER — SODIUM CHLORIDE 0.9% FLUSH
10.0000 mL | INTRAVENOUS | Status: DC | PRN
  Administered 2023-06-03: 10 mL

## 2023-06-03 MED ORDER — HEPARIN SOD (PORK) LOCK FLUSH 100 UNIT/ML IV SOLN
500.0000 [IU] | Freq: Once | INTRAVENOUS | Status: AC | PRN
  Administered 2023-06-03: 500 [IU]

## 2023-06-03 MED ORDER — SODIUM CHLORIDE 0.9 % IV SOLN
200.0000 mg | Freq: Once | INTRAVENOUS | Status: AC
  Administered 2023-06-03: 200 mg via INTRAVENOUS
  Filled 2023-06-03: qty 200

## 2023-06-03 MED ORDER — SODIUM CHLORIDE 0.9% FLUSH
10.0000 mL | Freq: Once | INTRAVENOUS | Status: AC
  Administered 2023-06-03: 10 mL

## 2023-06-03 NOTE — Patient Instructions (Signed)
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
## 2023-06-03 NOTE — Progress Notes (Signed)
Patient presents today for chemotherapy/immunotherapy infusion of Keytruda. Patient is in satisfactory condition with no new complaints voiced.  Vital signs are stable.  Labs reviewed by Dr.Mohamed during the office visit and all labs are within treatment parameters.  We will proceed with treatment per MD orders.   Patient tolerated treatment well with no complaints voiced.  Patient left ambulatory in stable condition.  Vital signs stable at discharge.  Follow up as scheduled.

## 2023-06-03 NOTE — Patient Instructions (Addendum)
[facility] - A DEPT OF [facility]  Discharge Instructions: Thank you for choosing [facility] to provide your oncology and hematology care.   If you have a lab appointment with the Cancer Center, please go directly to the Cancer Center and check in at the registration area.   Wear comfortable clothing and clothing appropriate for easy access to any Portacath or PICC line.   We strive to give you quality time with your provider. You may need to reschedule your appointment if you arrive late (15 or more minutes).  Arriving late affects you and other patients whose appointments are after yours.  Also, if you miss three or more appointments without notifying the office, you may be dismissed from the clinic at the provider's discretion.      For prescription refill requests, have your pharmacy contact our office and allow 72 hours for refills to be completed.    Today you received the following chemotherapy and/or immunotherapy agents keytruda.   Pembrolizumab Injection What is this medication? PEMBROLIZUMAB (PEM broe LIZ ue mab) treats some types of cancer. It works by helping your immune system slow or stop the spread of cancer cells. It is a monoclonal antibody. This medicine may be used for other purposes; ask your health care provider or pharmacist if you have questions. COMMON BRAND NAME(S): Keytruda What should I tell my care team before I take this medication? They need to know if you have any of these conditions: Allogeneic stem cell transplant (uses someone else's stem cells) Autoimmune diseases, such as Crohn disease, ulcerative colitis, lupus History of chest radiation Nervous system problems, such as Guillain-Barre syndrome, myasthenia gravis Organ transplant An unusual or allergic reaction to pembrolizumab, other medications, foods, dyes, or preservatives Pregnant or trying to get pregnant Breast-feeding How should I use this  medication? This medication is injected into a vein. It is given by your care team in a hospital or clinic setting. A special MedGuide will be given to you before each treatment. Be sure to read this information carefully each time. Talk to your care team about the use of this medication in children. While it may be prescribed for children as young as 6 months for selected conditions, precautions do apply. Overdosage: If you think you have taken too much of this medicine contact a poison control center or emergency room at once. NOTE: This medicine is only for you. Do not share this medicine with others. What if I miss a dose? Keep appointments for follow-up doses. It is important not to miss your dose. Call your care team if you are unable to keep an appointment. What may interact with this medication? Interactions have not been studied. This list may not describe all possible interactions. Give your health care provider a list of all the medicines, herbs, non-prescription drugs, or dietary supplements you use. Also tell them if you smoke, drink alcohol, or use illegal drugs. Some items may interact with your medicine. What should I watch for while using this medication? Your condition will be monitored carefully while you are receiving this medication. You may need blood work while taking this medication. This medication may cause serious skin reactions. They can happen weeks to months after starting the medication. Contact your care team right away if you notice fevers or flu-like symptoms with a rash. The rash may be red or purple and then turn into blisters or peeling of the skin. You may also  notice a red rash with swelling of the face, lips, or lymph nodes in your neck or under your arms. Tell your care team right away if you have any change in your eyesight. Talk to your care team if you may be pregnant. Serious birth defects can occur if you take this medication during pregnancy and for 4  months after the last dose. You will need a negative pregnancy test before starting this medication. Contraception is recommended while taking this medication and for 4 months after the last dose. Your care team can help you find the option that works for you. Do not breastfeed while taking this medication and for 4 months after the last dose. What side effects may I notice from receiving this medication? Side effects that you should report to your care team as soon as possible: Allergic reactions--skin rash, itching, hives, swelling of the face, lips, tongue, or throat Dry cough, shortness of breath or trouble breathing Eye pain, redness, irritation, or discharge with blurry or decreased vision Heart muscle inflammation--unusual weakness or fatigue, shortness of breath, chest pain, fast or irregular heartbeat, dizziness, swelling of the ankles, feet, or hands Hormone gland problems--headache, sensitivity to light, unusual weakness or fatigue, dizziness, fast or irregular heartbeat, increased sensitivity to cold or heat, excessive sweating, constipation, hair loss, increased thirst or amount of urine, tremors or shaking, irritability Infusion reactions--chest pain, shortness of breath or trouble breathing, feeling faint or lightheaded Kidney injury (glomerulonephritis)--decrease in the amount of urine, red or dark brown urine, foamy or bubbly urine, swelling of the ankles, hands, or feet Liver injury--right upper belly pain, loss of appetite, nausea, light-colored stool, dark yellow or brown urine, yellowing skin or eyes, unusual weakness or fatigue Pain, tingling, or numbness in the hands or feet, muscle weakness, change in vision, confusion or trouble speaking, loss of balance or coordination, trouble walking, seizures Rash, fever, and swollen lymph nodes Redness, blistering, peeling, or loosening of the skin, including inside the mouth Sudden or severe stomach pain, bloody diarrhea, fever, nausea,  vomiting Side effects that usually do not require medical attention (report to your care team if they continue or are bothersome): Bone, joint, or muscle pain Diarrhea Fatigue Loss of appetite Nausea Skin rash This list may not describe all possible side effects. Call your doctor for medical advice about side effects. You may report side effects to FDA at 1-800-FDA-1088. Where should I keep my medication? This medication is given in a hospital or clinic. It will not be stored at home. NOTE: This sheet is a summary. It may not cover all possible information. If you have questions about this medicine, talk to your doctor, pharmacist, or health care provider.  2024 Elsevier/Gold Standard (2021-08-20 00:00:00)      To help prevent nausea and vomiting after your treatment, we encourage you to take your nausea medication as directed.  BELOW ARE SYMPTOMS THAT SHOULD BE REPORTED IMMEDIATELY: *FEVER GREATER THAN 100.4 F (38 C) OR HIGHER *CHILLS OR SWEATING *NAUSEA AND VOMITING THAT IS NOT CONTROLLED WITH YOUR NAUSEA MEDICATION *UNUSUAL SHORTNESS OF BREATH *UNUSUAL BRUISING OR BLEEDING *URINARY PROBLEMS (pain or burning when urinating, or frequent urination) *BOWEL PROBLEMS (unusual diarrhea, constipation, pain near the anus) TENDERNESS IN MOUTH AND THROAT WITH OR WITHOUT PRESENCE OF ULCERS (sore throat, sores in mouth, or a toothache) UNUSUAL RASH, SWELLING OR PAIN  UNUSUAL VAGINAL DISCHARGE OR ITCHING   Items with * indicate a potential emergency and should be followed up as soon as possible or go  to the Emergency Department if any problems should occur.  Please show the CHEMOTHERAPY ALERT CARD or IMMUNOTHERAPY ALERT CARD at check-in to the Emergency Department and triage nurse.  Should you have questions after your visit or need to cancel or reschedule your appointment, please contact [facility] - A DEPT OF [facility]  Dept: [phone]  and follow the  prompts.  Office hours are 8:00 a.m. to 4:30 p.m. Monday - Friday. Please note that voicemails left after 4:00 p.m. may not be returned until the following business day.  We are closed weekends and major holidays. You have access to a nurse at all times for urgent questions. Please call the main number to the clinic Dept: [phone] and follow the prompts.   For any non-urgent questions, you may also contact your provider using MyChart. We now offer e-Visits for anyone 88 and older to request care online for non-urgent symptoms. For details visit [email or web address].   Also download the MyChart app! Go to the app store, search "MyChart", open the app, select [place], and log in with your MyChart username and password.

## 2023-06-04 ENCOUNTER — Ambulatory Visit: Payer: Medicare Other

## 2023-06-04 DIAGNOSIS — I890 Lymphedema, not elsewhere classified: Secondary | ICD-10-CM

## 2023-06-04 DIAGNOSIS — C3491 Malignant neoplasm of unspecified part of right bronchus or lung: Secondary | ICD-10-CM | POA: Diagnosis not present
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
## 2023-06-08 ENCOUNTER — Encounter (INDEPENDENT_AMBULATORY_CARE_PROVIDER_SITE_OTHER): Payer: Self-pay | Admitting: Ophthalmology

## 2023-06-08 ENCOUNTER — Ambulatory Visit (INDEPENDENT_AMBULATORY_CARE_PROVIDER_SITE_OTHER): Payer: Medicare Other | Admitting: Ophthalmology

## 2023-06-08 DIAGNOSIS — H35711 Central serous chorioretinopathy, right eye: Secondary | ICD-10-CM

## 2023-06-08 DIAGNOSIS — H353211 Exudative age-related macular degeneration, right eye, with active choroidal neovascularization: Secondary | ICD-10-CM

## 2023-06-08 DIAGNOSIS — H04123 Dry eye syndrome of bilateral lacrimal glands: Secondary | ICD-10-CM

## 2023-06-08 DIAGNOSIS — C3491 Malignant neoplasm of unspecified part of right bronchus or lung: Secondary | ICD-10-CM

## 2023-06-08 DIAGNOSIS — Z961 Presence of intraocular lens: Secondary | ICD-10-CM

## 2023-06-08 MED ORDER — BEVACIZUMAB CHEMO INJECTION 1.25MG/0.05ML SYRINGE FOR KALEIDOSCOPE
1.2500 mg | INTRAVITREAL | Status: AC | PRN
  Administered 2023-06-08: 1.25 mg via INTRAVITREAL

## 2023-06-09 ENCOUNTER — Ambulatory Visit: Payer: Medicare Other

## 2023-06-09 DIAGNOSIS — I890 Lymphedema, not elsewhere classified: Secondary | ICD-10-CM

## 2023-06-09 DIAGNOSIS — C3491 Malignant neoplasm of unspecified part of right bronchus or lung: Secondary | ICD-10-CM | POA: Diagnosis not present
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
## 2023-06-11 ENCOUNTER — Telehealth: Payer: Medicare Other | Admitting: Gastroenterology

## 2023-06-11 ENCOUNTER — Encounter (INDEPENDENT_AMBULATORY_CARE_PROVIDER_SITE_OTHER): Payer: Self-pay

## 2023-06-11 ENCOUNTER — Ambulatory Visit: Payer: Medicare Other

## 2023-06-12 ENCOUNTER — Encounter (HOSPITAL_COMMUNITY)
Admission: RE | Admit: 2023-06-12 | Discharge: 2023-06-12 | Disposition: A | Discharge status: Home / Self Care | Payer: Medicare Other | Source: Ambulatory Visit | Attending: Internal Medicine | Admitting: Internal Medicine

## 2023-06-12 DIAGNOSIS — C3490 Malignant neoplasm of unspecified part of unspecified bronchus or lung: Secondary | ICD-10-CM | POA: Diagnosis present

## 2023-06-12 LAB — GLUCOSE, CAPILLARY: Glucose-Capillary: 103 mg/dL — ABNORMAL HIGH (ref 70–99)

## 2023-06-12 MED ORDER — FLUDEOXYGLUCOSE F - 18 (FDG) INJECTION
7.2500 | Freq: Once | INTRAVENOUS | Status: AC
  Administered 2023-06-12: 7.25 via INTRAVENOUS

## 2023-06-16 ENCOUNTER — Ambulatory Visit: Payer: Medicare Other

## 2023-06-16 DIAGNOSIS — I890 Lymphedema, not elsewhere classified: Secondary | ICD-10-CM

## 2023-06-16 DIAGNOSIS — C3491 Malignant neoplasm of unspecified part of right bronchus or lung: Secondary | ICD-10-CM | POA: Diagnosis not present

## 2023-06-16 NOTE — Patient Instructions (Signed)
 How to access self MLD videos:  Editor, commissioning Videos:  Option 1:  [email or web address]  -Scroll down until you see "Self-MLD Lower Extremity"  -Watch the intro video until you feel comfortable with the material and then continue on to either the Right or the Left extremity   Option 2:  Donnetta Hutching Training Videos:  [email or web address] (The videos are in the Details section of the page)  -OR-  Go to [email or web address]  2.  Scroll down until to you see:  [employer] Exclusive Editor, commissioning Lymphedema Management Videos  Watch Free   3. Click on "Watch Free"  4. Scroll down until you see the instructional videos  5. Start with the "Self MLD Intro" video  6.  To watch the next appropriate video you will have to make an account on the website. There is a video for Right Arm, Left Arm, Right Leg, and Left Leg    MD Dareen Piano Video:    [email or web address]  -OR-  Enter "self MLD leg video MD anderson" into your search browser and choose the video: "Lymphedema Management: Manual lymph drainage for Lower Extremities"
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
## 2023-06-18 ENCOUNTER — Telehealth: Payer: Self-pay | Admitting: Medical Oncology

## 2023-06-18 NOTE — Telephone Encounter (Signed)
 Asking about scan results.  I told her it is taking 7-10 days for the radiologist to finalize.

## 2023-06-19 ENCOUNTER — Ambulatory Visit (INDEPENDENT_AMBULATORY_CARE_PROVIDER_SITE_OTHER): Payer: Medicare Other

## 2023-06-19 ENCOUNTER — Ambulatory Visit: Payer: Medicare Other

## 2023-06-19 VITALS — Ht 61.0 in | Wt 144.0 lb

## 2023-06-19 DIAGNOSIS — Z0000 Encounter for general adult medical examination without abnormal findings: Secondary | ICD-10-CM

## 2023-06-19 DIAGNOSIS — I890 Lymphedema, not elsewhere classified: Secondary | ICD-10-CM

## 2023-06-19 DIAGNOSIS — C3491 Malignant neoplasm of unspecified part of right bronchus or lung: Secondary | ICD-10-CM | POA: Diagnosis not present
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
## 2023-06-19 NOTE — Patient Instructions (Signed)
 Yvette Curry , Thank you for taking time to come for your Medicare Wellness Visit. I appreciate your ongoing commitment to your health goals. Please review the following plan we discussed and let me know if I can assist you in the future.   Referrals/Orders/Follow-Ups/Clinician Recommendations:  Next Medicare Annual Wellness Visit:   June 24, 2024 at 3:00 pm telephone visit.   This is a list of the screening recommended for you and due dates:  Health Maintenance  Topic Date Due   COVID-19 Vaccine (1) Never done   DTaP/Tdap/Td vaccine (1 - Tdap) Never done   Zoster (Shingles) Vaccine (1 of 2) Never done   Flu Shot  07/20/2023*   Mammogram  12/31/2023   DEXA scan (bone density measurement)  06/16/2024   Colon Cancer Screening  05/04/2026   Pneumonia Vaccine  Completed   HPV Vaccine  Aged Out   Hepatitis C Screening  Discontinued  *Topic was postponed. The date shown is not the original due date.    Advanced directives: (In Chart) A copy of your advanced directives are scanned into your chart should your provider ever need it.  Next Medicare Annual Wellness Visit scheduled for next year: yes  Understanding Your Risk for Falls Millions of people have serious injuries from falls each year. It is important to understand your risk of falling. Talk with your health care provider about your risk and what you can do to lower it. If you do have a serious fall, make sure to tell your provider. Falling once raises your risk of falling again. How can falls affect me? Serious injuries from falls are common. These include: Broken bones, such as hip fractures. Head injuries, such as traumatic brain injuries (TBI) or concussions. A fear of falling can cause you to avoid activities and stay at home. This can make your muscles weaker and raise your risk for a fall. What can increase my risk? There are a number of risk factors that increase your risk for falling. The more risk factors you have, the  higher your risk of falling. Serious injuries from a fall happen most often to people who are older than 67 years old. Teenagers and young adults ages 110-29 are also at higher risk. Common risk factors include: Weakness in the lower body. Being generally weak or confused due to long-term (chronic) illness. Dizziness or balance problems. Poor vision. Medicines that cause dizziness or drowsiness. These may include: Medicines for your blood pressure, heart, anxiety, insomnia, or swelling (edema). Pain medicines. Muscle relaxants. Other risk factors include: Drinking alcohol. Having had a fall in the past. Having foot pain or wearing improper footwear. Working at a dangerous job. Having any of the following in your home: Tripping hazards, such as floor clutter or loose rugs. Poor lighting. Pets. Having dementia or memory loss. What actions can I take to lower my risk of falling?     Physical activity Stay physically fit. Do strength and balance exercises. Consider taking a regular class to build strength and balance. Yoga and tai chi are good options. Vision Have your eyes checked every year and your prescription for glasses or contacts updated as needed. Shoes and walking aids Wear non-skid shoes. Wear shoes that have rubber soles and low heels. Do not wear high heels. Do not walk around the house in socks or slippers. Use a cane or walker as told by your provider. Home safety Attach secure railings on both sides of your stairs. Install grab bars for your bathtub,  shower, and toilet. Use a non-skid mat in your bathtub or shower. Attach bath mats securely with double-sided, non-slip rug tape. Use good lighting in all rooms. Keep a flashlight near your bed. Make sure there is a clear path from your bed to the bathroom. Use night-lights. Do not use throw rugs. Make sure all carpeting is taped or tacked down securely. Remove all clutter from walkways and stairways, including  extension cords. Repair uneven or broken steps and floors. Avoid walking on icy or slippery surfaces. Walk on the grass instead of on icy or slick sidewalks. Use ice melter to get rid of ice on walkways in the winter. Use a cordless phone. Questions to ask your health care provider Can you help me check my risk for a fall? Do any of my medicines make me more likely to fall? Should I take a vitamin D supplement? What exercises can I do to improve my strength and balance? Should I make an appointment to have my vision checked? Do I need a bone density test to check for weak bones (osteoporosis)? Would it help to use a cane or a walker? Where to find more information Centers for Disease Control and Prevention, STEADI: TonerPromos.no Community-Based Fall Prevention Programs: TonerPromos.no [employer] on Aging: BaseRingTones.pl Contact a health care provider if: You fall at home. You are afraid of falling at home. You feel weak, drowsy, or dizzy. This information is not intended to replace advice given to you by your health care provider. Make sure you discuss any questions you have with your health care provider. Document Revised: 12/09/2021 Document Reviewed: 12/09/2021 Elsevier Patient Education  2024 [employer].

## 2023-06-19 NOTE — Patient Instructions (Signed)
  Start with circles near neck placing hands behind collarbones and directing back and in x 10 each side.  Deep Effective Breath   Standing, sitting, or laying down place both hands on the belly. Take a deep breath IN, expanding the belly; then breath OUT, contracting the belly. Repeat __5__ times. Do __2-3__ sessions per day and before each self massage.   THIGH:   LEG: Knee to Hip - Clear   Pump up outer thigh of involved leg from knee to outer hip. Then do stationary circles from inner to outer thigh, then do outer thigh again. Next, interlace fingers behind knee IF ABLE and make in-place circles. Do _5_ times of each sequence.  Do _1__ time per day.  Copyright  VHI. All rights reserved.  LEG: Ankle to Hip Sweep   Hands on sides of ankle of involved leg, pump _5__ times up both sides of lower leg, then retrace steps up outer thigh to hip as before and back to pathway. Do _2-3_ times. Do __1_ time per day.  Copyright  VHI. All rights reserved.  FOOT: Dorsum of Foot and Toes Massage   One hand on top of foot make _5_ stationary circles or pumps, then either on top of toes or each individual toe do _5_ pumps. Then retrace all steps pumping back up both sides of lower leg, outer thigh, and then pathway. Finish with what you started with, _5_ circles at involved side arm pit. All _2-3_ times at each sequence. Do _1__ time per day.  Copyright  VHI. All rights reserved.     Cancer [facility] [phone]

## 2023-06-22 ENCOUNTER — Telehealth: Payer: Medicare Other | Admitting: Gastroenterology

## 2023-06-22 ENCOUNTER — Encounter: Payer: Self-pay | Admitting: Nurse Practitioner

## 2023-06-22 ENCOUNTER — Ambulatory Visit: Payer: Medicare Other | Attending: Nurse Practitioner

## 2023-06-22 ENCOUNTER — Ambulatory Visit (INDEPENDENT_AMBULATORY_CARE_PROVIDER_SITE_OTHER): Payer: Medicare Other | Admitting: Nurse Practitioner

## 2023-06-22 VITALS — BP 122/76 | HR 91 | Ht 61.0 in | Wt 147.8 lb

## 2023-06-22 DIAGNOSIS — C3491 Malignant neoplasm of unspecified part of right bronchus or lung: Secondary | ICD-10-CM

## 2023-06-22 DIAGNOSIS — I890 Lymphedema, not elsewhere classified: Secondary | ICD-10-CM | POA: Insufficient documentation

## 2023-06-22 DIAGNOSIS — I5032 Chronic diastolic (congestive) heart failure: Secondary | ICD-10-CM | POA: Diagnosis not present

## 2023-06-22 DIAGNOSIS — F17210 Nicotine dependence, cigarettes, uncomplicated: Secondary | ICD-10-CM | POA: Diagnosis not present

## 2023-06-22 DIAGNOSIS — J439 Emphysema, unspecified: Secondary | ICD-10-CM

## 2023-06-22 DIAGNOSIS — J90 Pleural effusion, not elsewhere classified: Secondary | ICD-10-CM
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
## 2023-06-22 NOTE — Progress Notes (Unsigned)
 @Patient  ID: Yvette Curry, female    DOB: Jul 03, 1955, 68 y.o.   MRN: [id number]  No chief complaint on file.   Referring provider: [name], DO  HPI: 68 year old female, active smoker followed for emphysema, stage IV adenocarcinoma of right lung.  She is a patient of Dr. Myrlene Broker and last seen in office 04/21/2023 by [facility] NP.  Past medical history significant for HFrEF, CAD, CKD  TEST/EVENTS:  10/27/2022 PFT: FVC 65, FEV1 68, ratio 73, TLC 89, DLCOunc 56. No BD 03/26/2023 CT chest: similar small pericardial effusion. Left axillary nodes. Unchanged right apical spiculated nodule, 1.1x 0.9 cm. Emphysema. RLL atelectasis/scarring. Slightly decreased bibasilar interlobular septal thickening, consistent with improving pulm edema. Similar large right and small left pleural effusion  12/18/2022: OV with Dr. Tonia Brooms for consultation due to shortness of breath.  PFT July 2024 with ratio 73, FEV1 69% and decreased diffusing capacity.  Not currently on any inhalers.  Started on [place].  03/30/2023: OV with Atilla Zollner NP for follow-up with her husband.  She has had a relatively complicated course since she was last here.  She started having trouble with the right lower extremity swelling and developed sepsis from cellulitis in October 2024.  She was admitted for 2 days from 10/16-10/18.  Since then she has had 2 additional hospitalizations, most recently 11/24-11/29 for acute respiratory failure and septic shock from suspected cellulitis.  She was treated with fluids, antibiotics and vasopressors.  She had a reaction to one of her antibiotics.  Infectious disease felt as though this was probably related to a cephalosporin allergy then vancomycin.  They transitioned her to Augmentin and continue doxycycline.  Prior to this, she had been seen by Dr. Myra Gianotti, who had recommended laser vein ablation to prevent further infections.  She did also have evidence of volume overload and acute CHF exacerbation with pulmonary  edema and bilateral effusions.  She was diuresed with Lasix 60 mg x 1; however, BP was labile during her stay and per the note, did not have any evidence of volume overload upon discharge so she was not discharged on any diuretics. She tells me today that she is overall feeling much better.  Her right leg has shown significant improvement since her admission.  She has completed her antibiotics.  She did see Dr. Myra Gianotti again 12/5.  Unfortunately, an ablation procedure is no longer able on the table because she has an allergy to lidocaine which is required with this type of procedure.  They did discuss alternative options including vein stripping in the operating room; however, she would have to be off chemo 2 weeks prior and 2 weeks postprocedure.  There was also some concern for wound complications given immunocompromise status.  The current plan is to await her next appoint with oncology, obtain custom compression stockings and then decide what the next best option would be based on her response to this.  Today, she tells me that she is likely leaning towards just doing the compression stockings, which she was able to receive and fit her much better. Regarding her breathing, she feels like she is mostly at her baseline.  She does have a little bit of an increased cough since her hospital stay, which is primarily nonproductive but she will occasionally get up some clear phlegm.  She also has swelling in her left lower leg that has been ongoing for over a month now.  She does not feel like it is necessarily any worse.  She does feel  like the compression stockings are helping some.  She has been tried on a fluid pill in the past but they had difficulties with her blood pressure as she was also on metoprolol and Entresto at the time.  She has not had any issues with low blood pressure since her discharge and has not had to take any of the midodrine that she is prescribed.  Not noticing any wheezing or chest  congestion.  Has not had any fevers, chills, hemoptysis, new/worsening calf pain, orthopnea, palpitations, CP.   She does note that prior to being diagnosed with heart failure and having the difficulties with her legs, her weight was 144 pounds.  Has not seem to be able to to get below 146 pounds even though her appetite has not changed.  When she was first admitted, she was up to 149.6 lb.   04/21/2023: Today - follow up Patient presents today for follow up with her husband. At her last visit, we treated her with lasix for volume overload. She tolerated this well. Her weight has decreased to 144 lb, which is her baseline. She feels like she is breathing better. She doesn't have much of a cough. Her swelling in the right leg is resolved. She still has some in the left leg, which has been constant since her ankle injury in November. She does feel like the swelling went down with the lasix. She has a follow up with ortho coming up. She had a recent echo which showed improvement in her EF to normal. She is using Breztri twice a day. Hasn't had to use the albuterol. She's not taking the lasix right now. She does have a colonoscopy coming up. She's worried about the prep and her blood pressure. She does have midodrine available, which she has not had to use recently.   Allergies  Allergen Reactions   Lidocaine Shortness Of Breath and Anxiety    Patient felt like she "couldn't breathe", panicky Allergic to all " caines"   Mepivacaine Swelling    angioedema   Demerol Nausea And Vomiting   Prednisone Hives and Nausea And Vomiting    abd pain and vomiting, Hives    Sulfa Antibiotics Hives    Hives, swelling and itching    Immunization History  Administered Date(s) Administered   Fluad Quad(high Dose 65+) 04/08/2022   PNEUMOCOCCAL CONJUGATE-20 06/04/2022    Past Medical History:  Diagnosis Date   Anxiety    no current tx   Depression    no meds at present   Dyspnea    Family history of  adverse reaction to anesthesia    pt states mom had allergic reaction to some unknown anesthesia   GERD (gastroesophageal reflux disease)    no tx since weight loss   Hypercholesteremia    Osteoarthritis    stage IV lung ca 11/2021    Tobacco History: Social History   Tobacco Use  Smoking Status Every Day   Current packs/day: 0.50   Average packs/day: 0.5 packs/day for 37.0 years (18.5 ttl pk-yrs)   Types: Cigarettes   Passive exposure: Current  Smokeless Tobacco Never  Tobacco Comments   Smokes half a pack of cigarettes a day. 12/18/2022 [place]   Ready to quit: Not Answered Counseling given: Not Answered Tobacco comments: Smokes half a pack of cigarettes a day. 12/18/2022 Tay   Outpatient Medications Prior to Visit  Medication Sig Dispense Refill   acetaminophen (TYLENOL) 500 MG tablet Take 500 mg by mouth as needed for mild  pain (pain score 1-3), moderate pain (pain score 4-6) or fever.     albuterol (VENTOLIN HFA) 108 (90 Base) MCG/ACT inhaler Inhale 2 puffs into the lungs every 6 (six) hours as needed for wheezing or shortness of breath. 8 g 2   ascorbic acid (VITAMIN C) 500 MG tablet Take 1,000 mg by mouth daily.     Budeson-Glycopyrrol-Formoterol (BREZTRI AEROSPHERE) 160-9-4.8 MCG/ACT AERO Inhale 2 puffs into the lungs in the morning and at bedtime. 10.7 g 3   carboxymethylcellulose 1 % ophthalmic solution Apply 1-2 drops to eye as needed (dry eye).     Cholecalciferol (VITAMIN D) 50 MCG (2000 UT) CAPS Take by mouth.     ferrous sulfate 324 MG TBEC Take 1 tablet (324 mg total) by mouth daily with breakfast. 30 tablet 4   folic acid (FOLVITE) 1 MG tablet Take 1 tablet (1 mg total) by mouth daily. 90 tablet 1   gabapentin (NEURONTIN) 100 MG capsule Take 2 capsules (200 mg total) by mouth 2 (two) times daily. (Patient taking differently: Take 100 mg by mouth 2 (two) times daily.) 360 capsule 1   lidocaine (LMX) 4 % cream Apply topically 3 (three) times daily as needed (leg pain).  (Patient taking differently: Apply 1 Application topically as needed (leg pain).) 30 g 0   midodrine (PROAMATINE) 5 MG tablet Take 1 tablet (5 mg total) by mouth 3 (three) times daily as needed (If your Systolic Blood Pressure is less than 90). 90 tablet 1   pantoprazole (PROTONIX) 40 MG tablet Take 40 mg by mouth daily.     Probiotic Product (PROBIOTIC PO) Take 1 capsule by mouth daily.     prochlorperazine (COMPAZINE) 10 MG tablet Take 10 mg by mouth as needed for nausea or vomiting. Every 3 weeks before chemo     sucralfate (CARAFATE) 1 g tablet Take 1 tablet (1 g total) by mouth 4 (four) times daily -  with meals and at bedtime. 120 tablet 0   Zinc 10 MG LOZG Use as directed in the mouth or throat.     zinc gluconate 50 MG tablet Take 100 mg by mouth daily.     No facility-administered medications prior to visit.     Review of Systems:   Constitutional: No night sweats, fevers, chills, or lassitude. +fatigue, weight loss HEENT: No headaches, difficulty swallowing, tooth/dental problems, or sore throat. No sneezing, itching, ear ache, nasal congestion, or post nasal drip CV:  +swelling in lower extremities (improved). No chest pain, orthopnea, PND, anasarca, dizziness, palpitations, syncope Resp: + shortness of breath with exertion (improved); chronic productive cough. No excess mucus or change in color of mucus. No hemoptysis. No wheezing.  No chest wall deformity GI:  No heartburn, indigestion, abdominal pain, nausea, vomiting, diarrhea, change in bowel habits, loss of appetite, bloody stools.  GU: No dysuria, change in color of urine, urgency or frequency.  No flank pain, no hematuria  Skin: No rash, lesions, ulcerations MSK:  No joint pain or swelling.  No decreased range of motion.  No back pain. Neuro: No dizziness or lightheadedness.  Psych: No depression or anxiety. Mood stable.     Physical Exam:  There were no vitals taken for this visit.  GEN: Pleasant, interactive,  well-appearing; in no acute distress HEENT:  Normocephalic and atraumatic. PERRLA. Sclera white. Nasal turbinates pink, moist and patent bilaterally. No rhinorrhea present. Oropharynx pink and moist, without exudate or edema. No lesions, ulcerations, or postnasal drip.  NECK:  Supple w/  fair ROM. No JVD present. Normal carotid impulses w/o bruits. Thyroid symmetrical with no goiter or nodules palpated. No lymphadenopathy.   CV: RRR, no m/r/g. +1 LLE edema. Pulses intact, +2 bilaterally. No cyanosis, pallor or clubbing. PULMONARY:  Unlabored, regular breathing. Diminished bibasilar airflow R>L otherwise clear w/o wheezes/rales/rhonchi. No accessory muscle use.  GI: BS present and normoactive. Soft, non-tender to palpation. No organomegaly or masses detected.  MSK: No erythema, warmth or tenderness. Cap refil <2 sec all extrem. No deformities or joint swelling noted.  Neuro: A/Ox3. No focal deficits noted.   Skin: Warm, no lesions or rashes. Erythema to right lower leg (improving per pt); no exudate Psych: Normal affect and behavior. Judgement and thought content appropriate.     Lab Results:  CBC    Component Value Date/Time   WBC 5.8 06/03/2023 1047   WBC 2.1 (L) 03/20/2023 0510   RBC 3.68 (L) 06/03/2023 1047   HGB 12.5 06/03/2023 1047   HGB 10.5 (L) 03/24/2023 1444   HCT 37.8 06/03/2023 1047   HCT 31.8 (L) 03/24/2023 1444   PLT 203 06/03/2023 1047   PLT 103 (L) 03/24/2023 1444   MCV 102.7 (H) 06/03/2023 1047   MCV 107 (H) 03/24/2023 1444   MCH 34.0 06/03/2023 1047   MCHC 33.1 06/03/2023 1047   RDW 13.2 06/03/2023 1047   RDW 14.5 03/24/2023 1444   LYMPHSABS 2.3 06/03/2023 1047   LYMPHSABS 1.7 03/24/2023 1444   MONOABS 0.5 06/03/2023 1047   EOSABS 0.2 06/03/2023 1047   EOSABS 0.2 03/24/2023 1444   BASOSABS 0.1 06/03/2023 1047   BASOSABS 0.0 03/24/2023 1444    BMET    Component Value Date/Time   NA 141 06/03/2023 1047   NA 144 03/24/2023 1444   K 4.2 06/03/2023 1047   CL  106 06/03/2023 1047   CO2 31 06/03/2023 1047   GLUCOSE 100 (H) 06/03/2023 1047   BUN 23 06/03/2023 1047   BUN 33 (H) 03/24/2023 1444   CREATININE 1.18 (H) 06/03/2023 1047   CREATININE 0.84 11/21/2021 1447   CALCIUM 10.1 06/03/2023 1047   GFRNONAA 51 (L) 06/03/2023 1047    BNP    Component Value Date/Time   BNP 25.0 03/15/2023 1843     Imaging:  Intravitreal Injection, Pharmacologic Agent - OD - Right Eye Result Date: 06/08/2023 Time Out 06/08/2023. 3:47 PM. Confirmed correct patient, procedure, site, and patient consented. Anesthesia Topical anesthesia was used. Anesthetic medications included Lidocaine 2%, Proparacaine 0.5%. Procedure Preparation included 5% betadine to ocular surface, eyelid speculum. A (32g) needle was used. Injection: 1.25 mg Bevacizumab 1.25mg /0.29ml   Route: Intravitreal, Site: Right Eye   NDC: [id number], Lot: [id number], Expiration date: 07/09/2023 Post-op Post injection exam found visual acuity of at least counting fingers. The patient tolerated the procedure well. There were no complications. The patient received written and verbal post procedure care education.   OCT, Retina - OU - Both Eyes Result Date: 06/08/2023 Right Eye Quality was good. Central Foveal Thickness: 305. Progression has improved. Findings include no IRF, abnormal foveal contour, pigment epithelial detachment, subretinal fluid, vitreomacular adhesion (Interval improvement central SRF and foveal contour). Left Eye Quality was good. Central Foveal Thickness: 280. Progression has been stable. Findings include normal foveal contour, no IRF, no SRF, vitreomacular adhesion . Notes *Images captured and stored on drive Diagnosis / Impression: OD: Interval improvement central SRF and foveal contour OS: NFP, no IRF/SRF Clinical management: See below Abbreviations: NFP - Normal foveal profile. CME - cystoid macular edema. PED -  pigment epithelial detachment. IRF - intraretinal fluid. SRF - subretinal fluid. EZ  - ellipsoid zone. ERM - epiretinal membrane. ORA - outer retinal atrophy. ORT - outer retinal tubulation. SRHM - subretinal hyper-reflective material. IRHM - intraretinal hyper-reflective material   DG ESOPHAGUS W DOUBLE CM (HD) Result Date: 05/25/2023 CLINICAL DATA:  Dysphagia. Recent endoscopy with esophageal dilatation. EXAM: ESOPHAGUS/BARIUM SWALLOW/TABLET STUDY TECHNIQUE: Combined double and single contrast examination was performed using effervescent crystals, high-density barium, and thin liquid barium. FLUOROSCOPY: 40 seconds (14.8 mGy) COMPARISON:  Esophagram-08/01/2016 FINDINGS: Swallowing: Appears normal. No vestibular penetration or aspiration seen. Pharynx: Unremarkable. Esophagus: Normal appearance. Esophageal motility: Mild esophageal dysmotility with lack of dominant stripping peristaltic wave, best appreciated in the prone position. Hiatal Hernia: None. Gastroesophageal reflux: None visualized or was elicited. Ingested 13 mm barium tablet: Passed normally. Other: None. IMPRESSION: 1. No evidence esophageal stricture, mass or ulceration. 2. Mild esophageal dysmotility with lack of dominant stripping peristaltic wave, best appreciated in the prone position. 3. No gastroesophageal reflux or hiatal hernia. Electronically Signed   By: Simonne Come M.D.   On: 05/25/2023 11:38    Bevacizumab (AVASTIN) SOLN 1.25 mg     Date Action Dose Route User   05/11/2023 1413 Given 1.25 mg Intravitreal (Right Eye) Rennis Chris, MD      Bevacizumab (AVASTIN) SOLN 1.25 mg     Date Action Dose Route User   06/08/2023 1731 Given 1.25 mg Intravitreal (Right Eye) Rennis Chris, MD      heparin lock flush 100 unit/mL     Date Action Dose Route User   05/13/2023 1335 Given 500 Units Intracatheter Purgason, Cortney D, RN      heparin lock flush 100 unit/mL     Date Action Dose Route User   06/03/2023 1334 Given 500 Units Intracatheter [place], [place] R, RN      pembrolizumab [facility]) 200 mg in  sodium chloride 0.9 % 50 mL chemo infusion     Date Action Dose Route User   05/13/2023 1253 Infusion Verify (none) Intravenous Gailen Shelter, RN   05/13/2023 1253 Rate/Dose Change (none) Intravenous Gailen Shelter, RN   05/13/2023 1253 New Bag/Given 200 mg Intravenous Purgason, Cortney D, RN      pembrolizumab (KEYTRUDA) 200 mg in sodium chloride 0.9 % 50 mL chemo infusion     Date Action Dose Route User   06/03/2023 1321 Rate/Dose Change (none) Intravenous Tommy Medal, RN   06/03/2023 1243 Infusion Verify (none) Intravenous Tommy Medal, RN   06/03/2023 1243 New Bag/Given 200 mg Intravenous Tarri Glenn R, RN      0.9 %  sodium chloride infusion     Date Action Dose Route User   05/13/2023 1332 Rate/Dose Change (none) Intravenous Gailen Shelter, RN   05/13/2023 1329 Rate/Dose Change (none) Intravenous Gailen Shelter, RN   05/13/2023 1222 Rate/Dose Change (none) Intravenous Purgason, Cortney D, RN   05/13/2023 1222 New Bag/Given (none) Intravenous Purgason, Cortney D, RN      0.9 %  sodium chloride infusion     Date Action Dose Route User   06/03/2023 1328 Rate/Dose Change (none) Intravenous Tommy Medal, RN   06/03/2023 1323 Rate/Dose Change (none) Intravenous Tommy Medal, RN   06/03/2023 1214 New Bag/Given (none) Intravenous Tarri Glenn R, RN      sodium chloride flush (NS) 0.9 % injection 10 mL     Date Action Dose Route User   05/13/2023 1335 Given 10 mL Intracatheter Purgason, Cortney D,  RN      sodium chloride flush (NS) 0.9 % injection 10 mL     Date Action Dose Route User   06/03/2023 1055 Given 10 mL Intracatheter Jacklynn Lewis, LPN      sodium chloride flush (NS) 0.9 % injection 10 mL     Date Action Dose Route User   06/03/2023 1334 Given 10 mL Intracatheter Tommy Medal, RN          Latest Ref Rng & Units 10/27/2022   11:42 AM  PFT Results  FVC-Pre L 1.85   FVC-Predicted Pre % 65    FVC-Post L 2.03   FVC-Predicted Post % 72   Pre FEV1/FVC % % 80   Post FEV1/FCV % % 73   FEV1-Pre L 1.47   FEV1-Predicted Pre % 68   FEV1-Post L 1.49   DLCO uncorrected ml/min/mmHg 9.92   DLCO UNC% % 56   DLVA Predicted % 68   TLC L 4.10   TLC % Predicted % 89   RV % Predicted % 98     No results found for: "NITRICOXIDE"      Assessment & Plan:   No problem-specific Assessment & Plan notes found for this encounter.     Advised if symptoms do not improve or worsen, to please contact office for sooner follow up or seek emergency care.   I spent 35 minutes of dedicated to the care of this patient on the date of this encounter to include pre-visit review of records, face-to-face time with the patient discussing conditions above, post visit ordering of testing, clinical documentation with the electronic health record, making appropriate referrals as documented, and communicating necessary findings to members of the patients care team.  Noemi Chapel, NP 06/22/2023  Pt aware and understands NP's role.

## 2023-06-22 NOTE — Patient Instructions (Signed)
 Continue Albuterol inhaler 2 puffs every 6 hours as needed for shortness of breath or wheezing. Notify if symptoms persist despite rescue inhaler/neb use.  Continue Breztri 2 puffs Twice daily. Brush tongue and rinse mouth afterwards   Monitor weights at home and notify cardiology of weight gain 2-3 lb overnight or 5 lb in a week    Wait for PET scan results and decide on biopsy with Dr. Arbutus Ped.   Follow up in 3-4 months with Dr. Delton Coombes (new pt 30 min slot) to establish care. If symptoms do not improve or worsen, please contact office for sooner follow up or seek emergency care.
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
## 2023-06-23 NOTE — Progress Notes (Unsigned)
 Palliative Medicine [facility]  [phone] [phone]   Name: Yvette Curry Date: 06/23/2023 MRN: [id number]  DOB: 05-Oct-1955  Patient Care Team: [name], DO as PCP - General (Family Medicine) Mallipeddi, Orion Modest, MD as PCP - Cardiology (Cardiology) Crista Elliot, MD as Consulting Physician (Urology) Pickenpack-Cousar, Arty Baumgartner, NP as Nurse Practitioner (Hospice and Palliative Medicine) Letta Median, PA-C as Physician Assistant (Gastroenterology) Pickenpack-Cousar, Arty Baumgartner, NP as Nurse Practitioner (Hospice and Palliative Medicine) Heilingoetter, Johnette Abraham, PA-C as Physician Assistant (Physician Assistant) Rennis Chris, MD as Consulting Physician (Ophthalmology) Marguerita Merles, Reuel Boom, MD as Consulting Physician (Gastroenterology) Nada Libman, MD as Consulting Physician (Vascular Surgery) Si Gaul, MD as Consulting Physician (Oncology)    INTERVAL HISTORY: Yvette Curry is a 68 y.o. 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 Palliative ask to see for symptom management and goals of care.   SOCIAL HISTORY:     reports that she has been smoking cigarettes. She has a 18.5 pack-year smoking history. She has been exposed to tobacco smoke. She has never used smokeless tobacco. She reports that she does not drink alcohol and does not use drugs.  ADVANCE DIRECTIVES:  None on file  CODE STATUS: Full code  PAST MEDICAL HISTORY: Past Medical History:  Diagnosis Date  . Anxiety    no current tx  . Depression    no meds at present  . Dyspnea   . Family history of adverse reaction to anesthesia    pt states mom had allergic reaction to some unknown anesthesia  . GERD (gastroesophageal reflux disease)    no tx since  weight loss  . Hypercholesteremia   . Osteoarthritis   . stage IV lung ca 11/2021    ALLERGIES:  is allergic to lidocaine, mepivacaine, demerol, prednisone, and sulfa antibiotics.  MEDICATIONS:  Current Outpatient Medications  Medication Sig Dispense Refill  . acetaminophen (TYLENOL) 500 MG tablet Take 500 mg by mouth as needed for mild pain (pain score 1-3), moderate pain (pain score 4-6) or fever.    Marland Kitchen albuterol (VENTOLIN HFA) 108 (90 Base) MCG/ACT inhaler Inhale 2 puffs into the lungs every 6 (six) hours as needed for wheezing or shortness of breath. 8 g 2  . ascorbic acid (VITAMIN C) 500 MG tablet Take 1,000 mg by mouth daily.    . Budeson-Glycopyrrol-Formoterol (BREZTRI AEROSPHERE) 160-9-4.8 MCG/ACT AERO Inhale 2 puffs into the lungs in the morning and at bedtime. 10.7 g 3  . carboxymethylcellulose 1 % ophthalmic solution Apply 1-2 drops to eye as needed (dry eye).    . Cholecalciferol (VITAMIN D) 50 MCG (2000 UT) CAPS Take by mouth.    . ferrous sulfate 324 MG TBEC Take 1 tablet (324 mg total) by mouth daily with breakfast. 30 tablet 4  . folic acid (FOLVITE) 1 MG tablet Take 1 tablet (1 mg total) by mouth daily. 90 tablet 1  . gabapentin (NEURONTIN) 100 MG capsule Take 2 capsules (200 mg total) by mouth 2 (two) times daily. (Patient taking differently: Take 100 mg by mouth 2 (two) times daily.) 360 capsule 1  . lidocaine (LMX) 4 % cream Apply topically 3 (three) times daily as needed (leg pain). (Patient taking differently: Apply 1 Application topically as needed (leg pain).) 30 g 0  . midodrine (PROAMATINE) 5 MG tablet Take  1 tablet (5 mg total) by mouth 3 (three) times daily as needed (If your Systolic Blood Pressure is less than 90). 90 tablet 1  . pantoprazole (PROTONIX) 40 MG tablet Take 40 mg by mouth daily.    . Probiotic Product (PROBIOTIC PO) Take 1 capsule by mouth daily.    . prochlorperazine (COMPAZINE) 10 MG tablet Take 10 mg by mouth as needed for nausea or vomiting.  Every 3 weeks before chemo    . sucralfate (CARAFATE) 1 g tablet Take 1 tablet (1 g total) by mouth 4 (four) times daily -  with meals and at bedtime. 120 tablet 0  . zinc gluconate 50 MG tablet Take 100 mg by mouth daily.     No current facility-administered medications for this visit.    VITAL SIGNS: There were no vitals taken for this visit. There were no vitals filed for this visit.  Estimated body mass index is 27.93 kg/m as calculated from the following:   Height as of 06/22/23: 5\' 1"  (1.549 m).   Weight as of 06/22/23: 147 lb 12.8 oz (67 kg).   PERFORMANCE STATUS (ECOG) : 1 - Symptomatic but completely ambulatory   Physical Exam General: NAD Cardiovascular: regular rate and rhythm Pulmonary: normal breathing pattern  Extremities:left lower edema, no joint deformities Skin: no rashes Neurological: AAO x3  Discussed the use of AI scribe software for clinical note transcription with the patient, who gave verbal consent to proceed.  IMPRESSION: I saw Yvette Curry during her infusion. No acute distress. Her husband is present. The patient has been managing well with her current condition, maintaining a good quality of life. Denies nausea, vomiting, constipation, or diarrhea. Appetite is good. She is trying to remain as active as possible.   Yvette Curry reports no new symptoms or health issues since the last visit. She recently underwent a colonoscopy and endoscopy, with no significant findings reported. However does continue to have persistent swelling in her left leg, which was initially attributed to cellulitis. The swelling has not subsided, and the patient reports sensitivity in the area, although not to the point of pain. Recently had an MRI of the leg due to a previous sprain, but results are pending per Orthopedic.   We discussed possibility of pursuing therapeutic lymphatic massaging to assist with decrease swelling. Advised once she gets MRI results if no concern that  contradicts massing will assist in arranging.   Assessment and Plan  Lower Extremity Swelling Patient reports persistent swelling in the left lower extremity, with a history of cellulitis. Recent MRI of the foot was performed to evaluate for potential bone or tendon damage, results pending. Sensitivity in the area but no significant pain reported. -Once MRI results are received, if no significant findings, refer to lymphedema clinic for evaluation and potential treatment. -Consider lymphatic massage if appropriate and available.  Neuropathy Patient reports improvement in neuropathic pain and is currently taking Gabapentin 100mg  twice daily. -Continue Gabapentin 100mg  twice daily. May increase to three times daily if tolerated.   Stage 4 Cancer Patient is currently stable with no new symptoms reported. Role of the provider is to monitor patient's condition, manage any symptoms that arise, and ensure patient's quality of life is maintained. -Continue current management plan. -Encourage patient to report any new symptoms promptly. -Will plan to see patient back in 3-4 weeks.  Patient expressed understanding and was in agreement with this plan. She also understands that She can call the clinic at any time with any questions,  concerns, or complaints.   Visit consisted of counseling and education dealing with the complex and emotionally intense issues of symptom management and palliative care in the setting of serious and potentially life-threatening illness.  Willette Alma, AGPCNP-BC  Palliative Medicine Team/[facility]

## 2023-06-24 ENCOUNTER — Inpatient Hospital Stay: Payer: Medicare Other | Attending: Internal Medicine

## 2023-06-24 ENCOUNTER — Inpatient Hospital Stay (HOSPITAL_BASED_OUTPATIENT_CLINIC_OR_DEPARTMENT_OTHER): Payer: Medicare Other | Admitting: Nurse Practitioner

## 2023-06-24 ENCOUNTER — Encounter: Payer: Self-pay | Admitting: Internal Medicine

## 2023-06-24 ENCOUNTER — Inpatient Hospital Stay: Payer: Medicare Other

## 2023-06-24 ENCOUNTER — Encounter: Payer: Self-pay | Admitting: Nurse Practitioner

## 2023-06-24 ENCOUNTER — Inpatient Hospital Stay (HOSPITAL_BASED_OUTPATIENT_CLINIC_OR_DEPARTMENT_OTHER): Payer: Medicare Other | Admitting: Physician Assistant

## 2023-06-24 VITALS — BP 131/90 | HR 88 | Temp 97.7°F | Resp 16 | Wt 148.4 lb

## 2023-06-24 DIAGNOSIS — Z79899 Other long term (current) drug therapy: Secondary | ICD-10-CM | POA: Diagnosis not present

## 2023-06-24 DIAGNOSIS — I890 Lymphedema, not elsewhere classified: Secondary | ICD-10-CM | POA: Diagnosis not present

## 2023-06-24 DIAGNOSIS — C3491 Malignant neoplasm of unspecified part of right bronchus or lung: Secondary | ICD-10-CM

## 2023-06-24 DIAGNOSIS — Z515 Encounter for palliative care: Secondary | ICD-10-CM | POA: Diagnosis not present

## 2023-06-24 DIAGNOSIS — Z5112 Encounter for antineoplastic immunotherapy: Secondary | ICD-10-CM | POA: Diagnosis not present

## 2023-06-24 DIAGNOSIS — C778 Secondary and unspecified malignant neoplasm of lymph nodes of multiple regions: Secondary | ICD-10-CM | POA: Diagnosis present

## 2023-06-24 DIAGNOSIS — F17210 Nicotine dependence, cigarettes, uncomplicated: Secondary | ICD-10-CM | POA: Insufficient documentation

## 2023-06-24 DIAGNOSIS — G893 Neoplasm related pain (acute) (chronic): Secondary | ICD-10-CM

## 2023-06-24 DIAGNOSIS — C3411 Malignant neoplasm of upper lobe, right bronchus or lung: Secondary | ICD-10-CM | POA: Diagnosis present

## 2023-06-24 DIAGNOSIS — G629 Polyneuropathy, unspecified: Secondary | ICD-10-CM | POA: Diagnosis not present

## 2023-06-24 LAB — CBC WITH DIFFERENTIAL (CANCER CENTER ONLY)
Abs Immature Granulocytes: 0.02 10*3/uL (ref 0.00–0.07)
Basophils Absolute: 0.1 10*3/uL (ref 0.0–0.1)
Basophils Relative: 1 %
Eosinophils Absolute: 0.3 10*3/uL (ref 0.0–0.5)
Eosinophils Relative: 4 %
HCT: 36 % (ref 36.0–46.0)
Hemoglobin: 12.1 g/dL (ref 12.0–15.0)
Immature Granulocytes: 0 %
Lymphocytes Relative: 39 %
Lymphs Abs: 2.2 10*3/uL (ref 0.7–4.0)
MCH: 33.1 pg (ref 26.0–34.0)
MCHC: 33.6 g/dL (ref 30.0–36.0)
MCV: 98.4 fL (ref 80.0–100.0)
Monocytes Absolute: 0.5 10*3/uL (ref 0.1–1.0)
Monocytes Relative: 10 %
Neutro Abs: 2.6 10*3/uL (ref 1.7–7.7)
Neutrophils Relative %: 46 %
Platelet Count: 193 10*3/uL (ref 150–400)
RBC: 3.66 MIL/uL — ABNORMAL LOW (ref 3.87–5.11)
RDW: 13.3 % (ref 11.5–15.5)
WBC Count: 5.6 10*3/uL (ref 4.0–10.5)
nRBC: 0 % (ref 0.0–0.2)

## 2023-06-24 LAB — CMP (CANCER CENTER ONLY)
ALT: 12 U/L (ref 0–44)
AST: 18 U/L (ref 15–41)
Albumin: 3.7 g/dL (ref 3.5–5.0)
Alkaline Phosphatase: 57 U/L (ref 38–126)
Anion gap: 3 — ABNORMAL LOW (ref 5–15)
BUN: 15 mg/dL (ref 8–23)
CO2: 31 mmol/L (ref 22–32)
Calcium: 9.8 mg/dL (ref 8.9–10.3)
Chloride: 107 mmol/L (ref 98–111)
Creatinine: 1.08 mg/dL — ABNORMAL HIGH (ref 0.44–1.00)
GFR, Estimated: 57 mL/min — ABNORMAL LOW (ref >60)
Glucose, Bld: 91 mg/dL (ref 70–99)
Potassium: 4.1 mmol/L (ref 3.5–5.1)
Sodium: 141 mmol/L (ref 135–145)
Total Bilirubin: 0.4 mg/dL (ref 0.0–1.2)
Total Protein: 6.7 g/dL (ref 6.5–8.1)

## 2023-06-24 LAB — TSH: TSH: 2.24 u[IU]/mL (ref 0.350–4.500)

## 2023-06-24 MED ORDER — SODIUM CHLORIDE 0.9 % IV SOLN
200.0000 mg | Freq: Once | INTRAVENOUS | Status: AC
  Administered 2023-06-24: 200 mg via INTRAVENOUS
  Filled 2023-06-24: qty 200

## 2023-06-24 MED ORDER — SODIUM CHLORIDE 0.9% FLUSH
10.0000 mL | INTRAVENOUS | Status: DC | PRN
  Administered 2023-06-24: 10 mL

## 2023-06-24 MED ORDER — HEPARIN SOD (PORK) LOCK FLUSH 100 UNIT/ML IV SOLN
500.0000 [IU] | Freq: Once | INTRAVENOUS | Status: AC | PRN
Start: 2023-06-24 — End: 2023-06-24
  Administered 2023-06-24: 500 [IU]

## 2023-06-24 MED ORDER — SODIUM CHLORIDE 0.9 % IV SOLN: Freq: Once | INTRAVENOUS | Status: AC

## 2023-06-24 MED ORDER — CYANOCOBALAMIN 1000 MCG/ML IJ SOLN
1000.0000 ug | Freq: Once | INTRAMUSCULAR | Status: AC
  Administered 2023-06-24: 1000 ug via INTRAMUSCULAR
  Filled 2023-06-24: qty 1

## 2023-06-24 NOTE — Patient Instructions (Addendum)
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
## 2023-06-25 ENCOUNTER — Telehealth (INDEPENDENT_AMBULATORY_CARE_PROVIDER_SITE_OTHER): Payer: Medicare Other | Admitting: Gastroenterology

## 2023-06-25 ENCOUNTER — Encounter: Payer: Self-pay | Admitting: Nurse Practitioner

## 2023-06-25 ENCOUNTER — Telehealth: Payer: Self-pay

## 2023-06-25 ENCOUNTER — Encounter: Payer: Self-pay | Admitting: Gastroenterology

## 2023-06-25 ENCOUNTER — Other Ambulatory Visit: Payer: Self-pay | Admitting: *Deleted

## 2023-06-25 ENCOUNTER — Ambulatory Visit: Payer: Medicare Other

## 2023-06-25 VITALS — BP 125/77 | HR 94 | Ht 61.0 in | Wt 145.0 lb

## 2023-06-25 DIAGNOSIS — R1312 Dysphagia, oropharyngeal phase: Secondary | ICD-10-CM

## 2023-06-25 DIAGNOSIS — I890 Lymphedema, not elsewhere classified: Secondary | ICD-10-CM | POA: Diagnosis not present

## 2023-06-25 DIAGNOSIS — K219 Gastro-esophageal reflux disease without esophagitis: Secondary | ICD-10-CM

## 2023-06-25 DIAGNOSIS — R1319 Other dysphagia: Secondary | ICD-10-CM | POA: Diagnosis not present

## 2023-06-25 LAB — T4: T4, Total: 8.9 ug/dL (ref 4.5–12.0)

## 2023-06-25 NOTE — Assessment & Plan Note (Signed)
 Improved EF to normal December 2024. Stable volume status/appears euvolemic on exam. Continue to monitor. Follow up with cardiology as scheduled.

## 2023-06-25 NOTE — Assessment & Plan Note (Signed)
 Moderate obstruction. Benefits from triple therapy use with Breztri. Continue current regimen. Action plan in place.

## 2023-06-25 NOTE — Telephone Encounter (Signed)
 Yvette Curry, you are scheduled for a virtual visit with your provider today.  Just as we do with appointments in the office, we must obtain your consent to participate.  Your consent will be active for this visit and any virtual visit you may have with one of our providers in the next 365 days.  If you have a MyChart account, I can also send a copy of this consent to you electronically.  All virtual visits are billed to your insurance company just like a traditional visit in the office.  As this is a virtual visit, video technology does not allow for your provider to perform a traditional examination.  This may limit your provider's ability to fully assess your condition.  If your provider identifies any concerns that need to be evaluated in person or the need to arrange testing such as labs, EKG, etc, we will make arrangements to do so.  Although advances in technology are sophisticated, we cannot ensure that it will always work on either your end or our end.  If the connection with a video visit is poor, we may have to switch to a telephone visit.  With either a video or telephone visit, we are not always able to ensure that we have a secure connection.   I need to obtain your verbal consent now.   Are you willing to proceed with your visit today? Yes.

## 2023-06-25 NOTE — Patient Instructions (Signed)
 We will send you to speech-language pathologist to evaluate your swallowing problem.  Continue pantoprazole 40 mg daily.  Stop Carafate.  Swallowing precautions:  Eat slowly, take small bites, chew thoroughly, drink plenty of liquids throughout meals.  Avoid trough textures All meats should be chopped finely.  If something gets hung in your esophagus and will not come up or go down, proceed to the emergency room.     I will plan to see you back in 6 months or sooner if needed.  [name], PA-C [facility]
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
## 2023-06-25 NOTE — Assessment & Plan Note (Signed)
 Resolved left effusion. Stable right effusion. Suspect recent increase was related to volume overload. Continue to monitor.

## 2023-06-25 NOTE — Assessment & Plan Note (Signed)
 Currently on immunotherapy with Keytruda. Alimta d/c due to recurrent cellulitis. Recent restaging CT with stable right lung mass, stable moderate right effusion, resolve left effusion, increased left axillary LAD. Awaiting PET scan final read. Upon my review, there is hypermetabolism to the left axillary nodes. There may also be increased activity to the thoracic spine when comparing to previous PET from 11/2021 but possible this is just soft tissue area. No pain/tenderness to the back. Will await final report. Follow up with oncology as scheduled.   Patient Instructions  Continue Albuterol inhaler 2 puffs every 6 hours as needed for shortness of breath or wheezing. Notify if symptoms persist despite rescue inhaler/neb use.  Continue Breztri 2 puffs Twice daily. Brush tongue and rinse mouth afterwards   Monitor weights at home and notify cardiology of weight gain 2-3 lb overnight or 5 lb in a week    Wait for PET scan results and decide on biopsy with Dr. Arbutus Ped.   Follow up in 3-4 months with Dr. Delton Coombes (new pt 30 min slot) to establish care. If symptoms do not improve or worsen, please contact office for sooner follow up or seek emergency care.

## 2023-06-28 NOTE — Addendum Note (Signed)
 Addended by: Waynette Buttery on: 06/28/2023 04:01 PM   Modules accepted: Orders

## 2023-07-06 ENCOUNTER — Ambulatory Visit (INDEPENDENT_AMBULATORY_CARE_PROVIDER_SITE_OTHER): Payer: Medicare Other | Admitting: Ophthalmology

## 2023-07-06 ENCOUNTER — Encounter (INDEPENDENT_AMBULATORY_CARE_PROVIDER_SITE_OTHER): Payer: Self-pay | Admitting: Ophthalmology

## 2023-07-06 DIAGNOSIS — H353211 Exudative age-related macular degeneration, right eye, with active choroidal neovascularization: Secondary | ICD-10-CM

## 2023-07-06 DIAGNOSIS — H35711 Central serous chorioretinopathy, right eye: Secondary | ICD-10-CM | POA: Diagnosis not present

## 2023-07-06 DIAGNOSIS — Z961 Presence of intraocular lens: Secondary | ICD-10-CM

## 2023-07-06 DIAGNOSIS — H04123 Dry eye syndrome of bilateral lacrimal glands: Secondary | ICD-10-CM | POA: Diagnosis not present

## 2023-07-06 DIAGNOSIS — C3491 Malignant neoplasm of unspecified part of right bronchus or lung: Secondary | ICD-10-CM

## 2023-07-06 MED ORDER — BEVACIZUMAB CHEMO INJECTION 1.25MG/0.05ML SYRINGE FOR KALEIDOSCOPE
1.2500 mg | INTRAVITREAL | Status: AC | PRN
Start: 2023-07-06 — End: 2023-07-06
  Administered 2023-07-06: 1.25 mg via INTRAVITREAL

## 2023-07-07 ENCOUNTER — Ambulatory Visit

## 2023-07-07 DIAGNOSIS — I890 Lymphedema, not elsewhere classified: Secondary | ICD-10-CM | POA: Diagnosis not present
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
## 2023-07-09 ENCOUNTER — Other Ambulatory Visit: Payer: Self-pay

## 2023-07-09 ENCOUNTER — Ambulatory Visit (HOSPITAL_COMMUNITY): Attending: Gastroenterology | Admitting: Speech Pathology

## 2023-07-09 ENCOUNTER — Encounter (HOSPITAL_COMMUNITY): Payer: Self-pay | Admitting: Speech Pathology

## 2023-07-09 ENCOUNTER — Ambulatory Visit

## 2023-07-09 DIAGNOSIS — R1312 Dysphagia, oropharyngeal phase: Secondary | ICD-10-CM | POA: Diagnosis not present

## 2023-07-09 DIAGNOSIS — K219 Gastro-esophageal reflux disease without esophagitis: Secondary | ICD-10-CM | POA: Insufficient documentation

## 2023-07-09 DIAGNOSIS — I890 Lymphedema, not elsewhere classified: Secondary | ICD-10-CM

## 2023-07-09 DIAGNOSIS — R1311 Dysphagia, oral phase: Secondary | ICD-10-CM | POA: Insufficient documentation
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
## 2023-07-13 ENCOUNTER — Ambulatory Visit

## 2023-07-13 DIAGNOSIS — I890 Lymphedema, not elsewhere classified: Secondary | ICD-10-CM
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
## 2023-07-14 ENCOUNTER — Other Ambulatory Visit: Payer: Self-pay | Admitting: Gastroenterology

## 2023-07-14 DIAGNOSIS — R1310 Dysphagia, unspecified: Secondary | ICD-10-CM

## 2023-07-15 ENCOUNTER — Encounter: Payer: Self-pay | Admitting: Internal Medicine

## 2023-07-15 ENCOUNTER — Ambulatory Visit

## 2023-07-15 ENCOUNTER — Inpatient Hospital Stay: Payer: Medicare Other

## 2023-07-15 ENCOUNTER — Inpatient Hospital Stay (HOSPITAL_BASED_OUTPATIENT_CLINIC_OR_DEPARTMENT_OTHER): Payer: Medicare Other | Admitting: Physician Assistant

## 2023-07-15 ENCOUNTER — Other Ambulatory Visit: Payer: Self-pay | Admitting: Physician Assistant

## 2023-07-15 VITALS — BP 127/83 | HR 77 | Temp 97.6°F | Resp 16 | Ht 61.0 in | Wt 150.6 lb

## 2023-07-15 DIAGNOSIS — I890 Lymphedema, not elsewhere classified: Secondary | ICD-10-CM

## 2023-07-15 DIAGNOSIS — Z5112 Encounter for antineoplastic immunotherapy: Secondary | ICD-10-CM

## 2023-07-15 DIAGNOSIS — C3491 Malignant neoplasm of unspecified part of right bronchus or lung: Secondary | ICD-10-CM | POA: Diagnosis not present

## 2023-07-15 DIAGNOSIS — D701 Agranulocytosis secondary to cancer chemotherapy: Secondary | ICD-10-CM

## 2023-07-15 LAB — CBC WITH DIFFERENTIAL (CANCER CENTER ONLY)
Abs Immature Granulocytes: 0.02 10*3/uL (ref 0.00–0.07)
Basophils Absolute: 0 10*3/uL (ref 0.0–0.1)
Basophils Relative: 1 %
Eosinophils Absolute: 0.2 10*3/uL (ref 0.0–0.5)
Eosinophils Relative: 3 %
HCT: 37.3 % (ref 36.0–46.0)
Hemoglobin: 12.4 g/dL (ref 12.0–15.0)
Immature Granulocytes: 0 %
Lymphocytes Relative: 27 %
Lymphs Abs: 1.5 10*3/uL (ref 0.7–4.0)
MCH: 32.8 pg (ref 26.0–34.0)
MCHC: 33.2 g/dL (ref 30.0–36.0)
MCV: 98.7 fL (ref 80.0–100.0)
Monocytes Absolute: 0.5 10*3/uL (ref 0.1–1.0)
Monocytes Relative: 10 %
Neutro Abs: 3.3 10*3/uL (ref 1.7–7.7)
Neutrophils Relative %: 59 %
Platelet Count: 199 10*3/uL (ref 150–400)
RBC: 3.78 MIL/uL — ABNORMAL LOW (ref 3.87–5.11)
RDW: 13.8 % (ref 11.5–15.5)
WBC Count: 5.6 10*3/uL (ref 4.0–10.5)
nRBC: 0 % (ref 0.0–0.2)

## 2023-07-15 LAB — CMP (CANCER CENTER ONLY)
ALT: 12 U/L (ref 0–44)
AST: 15 U/L (ref 15–41)
Albumin: 3.8 g/dL (ref 3.5–5.0)
Alkaline Phosphatase: 66 U/L (ref 38–126)
Anion gap: 4 — ABNORMAL LOW (ref 5–15)
BUN: 17 mg/dL (ref 8–23)
CO2: 32 mmol/L (ref 22–32)
Calcium: 10.2 mg/dL (ref 8.9–10.3)
Chloride: 106 mmol/L (ref 98–111)
Creatinine: 1.08 mg/dL — ABNORMAL HIGH (ref 0.44–1.00)
GFR, Estimated: 57 mL/min — ABNORMAL LOW (ref >60)
Glucose, Bld: 89 mg/dL (ref 70–99)
Potassium: 4.3 mmol/L (ref 3.5–5.1)
Sodium: 142 mmol/L (ref 135–145)
Total Bilirubin: 0.5 mg/dL (ref 0.0–1.2)
Total Protein: 6.8 g/dL (ref 6.5–8.1)

## 2023-07-15 MED ORDER — SODIUM CHLORIDE 0.9% FLUSH
10.0000 mL | Freq: Once | INTRAVENOUS | Status: AC
Start: 2023-07-15 — End: 2023-07-15
  Administered 2023-07-15: 10 mL

## 2023-07-15 MED ORDER — SODIUM CHLORIDE 0.9 % IV SOLN: Freq: Once | INTRAVENOUS | Status: AC

## 2023-07-15 MED ORDER — HEPARIN SOD (PORK) LOCK FLUSH 100 UNIT/ML IV SOLN
500.0000 [IU] | Freq: Once | INTRAVENOUS | Status: AC | PRN
Start: 2023-07-15 — End: 2023-07-15
  Administered 2023-07-15: 500 [IU]

## 2023-07-15 MED ORDER — SODIUM CHLORIDE 0.9% FLUSH
10.0000 mL | INTRAVENOUS | Status: DC | PRN
  Administered 2023-07-15: 10 mL

## 2023-07-15 MED ORDER — SODIUM CHLORIDE 0.9 % IV SOLN
200.0000 mg | Freq: Once | INTRAVENOUS | Status: AC
  Administered 2023-07-15: 200 mg via INTRAVENOUS
  Filled 2023-07-15: qty 200

## 2023-07-15 NOTE — Patient Instructions (Signed)
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
## 2023-07-20 ENCOUNTER — Ambulatory Visit

## 2023-07-20 DIAGNOSIS — I890 Lymphedema, not elsewhere classified: Secondary | ICD-10-CM | POA: Diagnosis not present
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
## 2023-07-22 ENCOUNTER — Other Ambulatory Visit (INDEPENDENT_AMBULATORY_CARE_PROVIDER_SITE_OTHER): Payer: Self-pay | Admitting: Gastroenterology

## 2023-07-22 ENCOUNTER — Ambulatory Visit: Attending: Nurse Practitioner

## 2023-07-22 DIAGNOSIS — I890 Lymphedema, not elsewhere classified: Secondary | ICD-10-CM | POA: Diagnosis present
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
## 2023-07-27 ENCOUNTER — Ambulatory Visit

## 2023-07-27 DIAGNOSIS — I890 Lymphedema, not elsewhere classified: Secondary | ICD-10-CM | POA: Diagnosis not present
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
## 2023-07-30 ENCOUNTER — Encounter

## 2023-08-03 ENCOUNTER — Ambulatory Visit (INDEPENDENT_AMBULATORY_CARE_PROVIDER_SITE_OTHER): Admitting: Ophthalmology

## 2023-08-03 ENCOUNTER — Other Ambulatory Visit (HOSPITAL_COMMUNITY): Payer: Self-pay | Admitting: Occupational Therapy

## 2023-08-03 ENCOUNTER — Encounter (INDEPENDENT_AMBULATORY_CARE_PROVIDER_SITE_OTHER): Payer: Self-pay | Admitting: Ophthalmology

## 2023-08-03 ENCOUNTER — Encounter: Payer: Self-pay | Admitting: *Deleted

## 2023-08-03 ENCOUNTER — Other Ambulatory Visit: Payer: Self-pay | Admitting: *Deleted

## 2023-08-03 DIAGNOSIS — Z961 Presence of intraocular lens: Secondary | ICD-10-CM

## 2023-08-03 DIAGNOSIS — H35711 Central serous chorioretinopathy, right eye: Secondary | ICD-10-CM

## 2023-08-03 DIAGNOSIS — R1312 Dysphagia, oropharyngeal phase: Secondary | ICD-10-CM

## 2023-08-03 DIAGNOSIS — H04123 Dry eye syndrome of bilateral lacrimal glands: Secondary | ICD-10-CM | POA: Diagnosis not present

## 2023-08-03 DIAGNOSIS — H353211 Exudative age-related macular degeneration, right eye, with active choroidal neovascularization: Secondary | ICD-10-CM

## 2023-08-03 DIAGNOSIS — R1319 Other dysphagia: Secondary | ICD-10-CM

## 2023-08-03 DIAGNOSIS — R059 Cough, unspecified: Secondary | ICD-10-CM

## 2023-08-03 DIAGNOSIS — K219 Gastro-esophageal reflux disease without esophagitis: Secondary | ICD-10-CM

## 2023-08-03 DIAGNOSIS — C3491 Malignant neoplasm of unspecified part of right bronchus or lung: Secondary | ICD-10-CM

## 2023-08-03 DIAGNOSIS — T17308A Unspecified foreign body in larynx causing other injury, initial encounter: Secondary | ICD-10-CM

## 2023-08-03 MED ORDER — BEVACIZUMAB CHEMO INJECTION 1.25MG/0.05ML SYRINGE FOR KALEIDOSCOPE
1.2500 mg | INTRAVITREAL | Status: AC | PRN
  Administered 2023-08-03: 1.25 mg via INTRAVITREAL

## 2023-08-03 NOTE — Progress Notes (Signed)
Pt agreeable. Order placed.

## 2023-08-05 ENCOUNTER — Encounter: Payer: Self-pay | Admitting: Nurse Practitioner

## 2023-08-05 ENCOUNTER — Inpatient Hospital Stay: Payer: Medicare Other | Attending: Internal Medicine

## 2023-08-05 ENCOUNTER — Inpatient Hospital Stay (HOSPITAL_BASED_OUTPATIENT_CLINIC_OR_DEPARTMENT_OTHER): Payer: Medicare Other | Attending: Internal Medicine | Admitting: Internal Medicine

## 2023-08-05 ENCOUNTER — Inpatient Hospital Stay (HOSPITAL_BASED_OUTPATIENT_CLINIC_OR_DEPARTMENT_OTHER): Payer: Medicare Other | Admitting: Nurse Practitioner

## 2023-08-05 ENCOUNTER — Inpatient Hospital Stay: Payer: Medicare Other

## 2023-08-05 VITALS — BP 136/84 | HR 92 | Temp 97.9°F | Resp 17 | Ht 61.0 in | Wt 148.0 lb

## 2023-08-05 DIAGNOSIS — C778 Secondary and unspecified malignant neoplasm of lymph nodes of multiple regions: Secondary | ICD-10-CM | POA: Insufficient documentation

## 2023-08-05 DIAGNOSIS — Z515 Encounter for palliative care: Secondary | ICD-10-CM | POA: Diagnosis not present

## 2023-08-05 DIAGNOSIS — I890 Lymphedema, not elsewhere classified: Secondary | ICD-10-CM | POA: Insufficient documentation

## 2023-08-05 DIAGNOSIS — G893 Neoplasm related pain (acute) (chronic): Secondary | ICD-10-CM | POA: Diagnosis not present

## 2023-08-05 DIAGNOSIS — T451X5A Adverse effect of antineoplastic and immunosuppressive drugs, initial encounter: Secondary | ICD-10-CM

## 2023-08-05 DIAGNOSIS — C3490 Malignant neoplasm of unspecified part of unspecified bronchus or lung: Secondary | ICD-10-CM | POA: Diagnosis not present

## 2023-08-05 DIAGNOSIS — Z5112 Encounter for antineoplastic immunotherapy: Secondary | ICD-10-CM | POA: Insufficient documentation

## 2023-08-05 DIAGNOSIS — C3491 Malignant neoplasm of unspecified part of right bronchus or lung: Secondary | ICD-10-CM

## 2023-08-05 DIAGNOSIS — F17210 Nicotine dependence, cigarettes, uncomplicated: Secondary | ICD-10-CM | POA: Diagnosis not present

## 2023-08-05 DIAGNOSIS — C3411 Malignant neoplasm of upper lobe, right bronchus or lung: Secondary | ICD-10-CM | POA: Diagnosis present

## 2023-08-05 LAB — CBC WITH DIFFERENTIAL (CANCER CENTER ONLY)
Abs Immature Granulocytes: 0.02 10*3/uL (ref 0.00–0.07)
Basophils Absolute: 0.1 10*3/uL (ref 0.0–0.1)
Basophils Relative: 1 %
Eosinophils Absolute: 0.3 10*3/uL (ref 0.0–0.5)
Eosinophils Relative: 5 %
HCT: 38.6 % (ref 36.0–46.0)
Hemoglobin: 13 g/dL (ref 12.0–15.0)
Immature Granulocytes: 0 %
Lymphocytes Relative: 30 %
Lymphs Abs: 1.9 10*3/uL (ref 0.7–4.0)
MCH: 32.3 pg (ref 26.0–34.0)
MCHC: 33.7 g/dL (ref 30.0–36.0)
MCV: 95.8 fL (ref 80.0–100.0)
Monocytes Absolute: 0.8 10*3/uL (ref 0.1–1.0)
Monocytes Relative: 12 %
Neutro Abs: 3.3 10*3/uL (ref 1.7–7.7)
Neutrophils Relative %: 52 %
Platelet Count: 183 10*3/uL (ref 150–400)
RBC: 4.03 MIL/uL (ref 3.87–5.11)
RDW: 13.9 % (ref 11.5–15.5)
WBC Count: 6.3 10*3/uL (ref 4.0–10.5)
nRBC: 0 % (ref 0.0–0.2)

## 2023-08-05 LAB — CMP (CANCER CENTER ONLY)
ALT: 15 U/L (ref 0–44)
AST: 20 U/L (ref 15–41)
Albumin: 4 g/dL (ref 3.5–5.0)
Alkaline Phosphatase: 65 U/L (ref 38–126)
Anion gap: 4 — ABNORMAL LOW (ref 5–15)
BUN: 22 mg/dL (ref 8–23)
CO2: 30 mmol/L (ref 22–32)
Calcium: 10.2 mg/dL (ref 8.9–10.3)
Chloride: 106 mmol/L (ref 98–111)
Creatinine: 1.1 mg/dL — ABNORMAL HIGH (ref 0.44–1.00)
GFR, Estimated: 55 mL/min — ABNORMAL LOW (ref >60)
Glucose, Bld: 92 mg/dL (ref 70–99)
Potassium: 4.3 mmol/L (ref 3.5–5.1)
Sodium: 140 mmol/L (ref 135–145)
Total Bilirubin: 0.5 mg/dL (ref 0.0–1.2)
Total Protein: 7.2 g/dL (ref 6.5–8.1)

## 2023-08-05 MED ORDER — SODIUM CHLORIDE 0.9% FLUSH
10.0000 mL | Freq: Once | INTRAVENOUS | Status: AC
  Administered 2023-08-05: 10 mL

## 2023-08-05 MED ORDER — SODIUM CHLORIDE 0.9 % IV SOLN
200.0000 mg | Freq: Once | INTRAVENOUS | Status: AC
  Administered 2023-08-05: 200 mg via INTRAVENOUS
  Filled 2023-08-05: qty 200

## 2023-08-05 MED ORDER — HEPARIN SOD (PORK) LOCK FLUSH 100 UNIT/ML IV SOLN
500.0000 [IU] | Freq: Once | INTRAVENOUS | Status: AC | PRN
Start: 2023-08-05 — End: 2023-08-05
  Administered 2023-08-05: 500 [IU]

## 2023-08-05 MED ORDER — SODIUM CHLORIDE 0.9% FLUSH
10.0000 mL | INTRAVENOUS | Status: DC | PRN
  Administered 2023-08-05: 10 mL

## 2023-08-05 MED ORDER — SODIUM CHLORIDE 0.9 % IV SOLN: Freq: Once | INTRAVENOUS | Status: AC

## 2023-08-05 NOTE — Patient Instructions (Signed)
 [facility] - A DEPT OF [facility]  Discharge Instructions: Thank you for choosing [facility] to provide your oncology and hematology care.   If you have a lab appointment with the Cancer Center, please go directly to the Cancer Center and check in at the registration area.   Wear comfortable clothing and clothing appropriate for easy access to any Portacath or PICC line.   We strive to give you quality time with your provider. You may need to reschedule your appointment if you arrive late (15 or more minutes).  Arriving late affects you and other patients whose appointments are after yours.  Also, if you miss three or more appointments without notifying the office, you may be dismissed from the clinic at the provider's discretion.      For prescription refill requests, have your pharmacy contact our office and allow 72 hours for refills to be completed.    Today you received the following chemotherapy and/or immunotherapy agents :  Pembrolizumab       To help prevent nausea and vomiting after your treatment, we encourage you to take your nausea medication as directed.  BELOW ARE SYMPTOMS THAT SHOULD BE REPORTED IMMEDIATELY: *FEVER GREATER THAN 100.4 F (38 C) OR HIGHER *CHILLS OR SWEATING *NAUSEA AND VOMITING THAT IS NOT CONTROLLED WITH YOUR NAUSEA MEDICATION *UNUSUAL SHORTNESS OF BREATH *UNUSUAL BRUISING OR BLEEDING *URINARY PROBLEMS (pain or burning when urinating, or frequent urination) *BOWEL PROBLEMS (unusual diarrhea, constipation, pain near the anus) TENDERNESS IN MOUTH AND THROAT WITH OR WITHOUT PRESENCE OF ULCERS (sore throat, sores in mouth, or a toothache) UNUSUAL RASH, SWELLING OR PAIN  UNUSUAL VAGINAL DISCHARGE OR ITCHING   Items with * indicate a potential emergency and should be followed up as soon as possible or go to the Emergency Department if any problems should occur.  Please show the CHEMOTHERAPY ALERT CARD or  IMMUNOTHERAPY ALERT CARD at check-in to the Emergency Department and triage nurse.  Should you have questions after your visit or need to cancel or reschedule your appointment, please contact [facility] - A DEPT [facility]  Dept: [phone]  and follow the prompts.  Office hours are 8:00 a.m. to 4:30 p.m. Monday - Friday. Please note that voicemails left after 4:00 p.m. may not be returned until the following business day.  We are closed weekends and major holidays. You have access to a nurse at all times for urgent questions. Please call the main number to the clinic Dept: [phone] and follow the prompts.   For any non-urgent questions, you may also contact your provider using MyChart. We now offer e-Visits for anyone 25 and older to request care online for non-urgent symptoms. For details visit [email or web address].   Also download the MyChart app! Go to the app store, search "MyChart", open the app, select [facility], and log in with your MyChart username and password.

## 2023-08-05 NOTE — Progress Notes (Signed)
 Palliative Medicine [facility]  Telephone:([phone] Fax:([phone]   Name: Yvette Curry Date: 08/05/2023 MRN: [id number]  DOB: March 12, 1956  Patient Care Team: [name], DO as PCP - General (Family Medicine) Mallipeddi, Orion Modest, MD as PCP - Cardiology (Cardiology) Crista Elliot, MD as Consulting Physician (Urology) Pickenpack-Cousar, Arty Baumgartner, NP as Nurse Practitioner (Hospice and Palliative Medicine) Letta Median, PA-C as Physician Assistant (Gastroenterology) Pickenpack-Cousar, Arty Baumgartner, NP as Nurse Practitioner (Hospice and Palliative Medicine) Heilingoetter, Johnette Abraham, PA-C as Physician Assistant (Physician Assistant) Rennis Chris, MD as Consulting Physician (Ophthalmology) Marguerita Merles, Reuel Boom, MD as Consulting Physician (Gastroenterology) Nada Libman, MD as Consulting Physician (Vascular Surgery) Si Gaul, MD as Consulting Physician (Oncology)    INTERVAL HISTORY: Yvette Curry is a 68 y.o. 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 Palliative ask to see for symptom management and goals of care.   SOCIAL HISTORY:     reports that she has been smoking cigarettes. She has a 18.5 pack-year smoking history. She has been exposed to tobacco smoke. She has never used smokeless tobacco. She reports that she does not drink alcohol and does not use drugs.  ADVANCE DIRECTIVES:  None on file   CODE STATUS: Full code  PAST MEDICAL HISTORY: Past Medical History:  Diagnosis Date   Anxiety    no current tx   Depression    no meds at present   Dyspnea    Family history of adverse reaction to anesthesia    pt states mom had allergic reaction to some unknown anesthesia   GERD (gastroesophageal reflux disease)    no tx since weight  loss   Hypercholesteremia    Osteoarthritis    stage IV lung ca 11/2021    ALLERGIES:  is allergic to lidocaine, mepivacaine, demerol, prednisone, and sulfa antibiotics.  MEDICATIONS:  Current Outpatient Medications  Medication Sig Dispense Refill   ascorbic acid (VITAMIN C) 500 MG tablet Take 1,000 mg by mouth daily.     Budeson-Glycopyrrol-Formoterol (BREZTRI AEROSPHERE) 160-9-4.8 MCG/ACT AERO Inhale 2 puffs into the lungs in the morning and at bedtime. 10.7 g 3   carboxymethylcellulose 1 % ophthalmic solution Apply 1-2 drops to eye as needed (dry eye).     Cholecalciferol (VITAMIN D) 50 MCG (2000 UT) CAPS Take by mouth.     ferrous sulfate 324 MG TBEC Take 1 tablet (324 mg total) by mouth daily with breakfast. 30 tablet 4   gabapentin (NEURONTIN) 100 MG capsule Take 2 capsules (200 mg total) by mouth 2 (two) times daily. (Patient not taking: Reported on 08/03/2023) 360 capsule 1   midodrine (PROAMATINE) 5 MG tablet Take 1 tablet (5 mg total) by mouth 3 (three) times daily as needed (If your Systolic Blood Pressure is less than 90). 90 tablet 1   pantoprazole (PROTONIX) 40 MG tablet TAKE 1 TABLET BY MOUTH EVERY DAY BEFORE BREAKFAST 90 tablet 1   Probiotic Product (PROBIOTIC PO) Take 1 capsule by mouth daily.     prochlorperazine (COMPAZINE) 10 MG tablet Take 10 mg by mouth as needed for nausea or vomiting. Every 3 weeks before chemo     zinc gluconate 50 MG tablet Take 100 mg by mouth daily.     No current facility-administered medications for this visit.   Facility-Administered Medications Ordered in Other Visits  Medication Dose Route Frequency Provider Last Rate Last Admin   sodium chloride flush (NS) 0.9 % injection 10 mL  10 mL Intracatheter PRN Marlene Simas, MD   10 mL at 08/05/23 1359    VITAL SIGNS: There were no vitals taken for this visit. There were no vitals filed for this visit.  Estimated body mass index is 27.96 kg/m as calculated from the following:   Height as  of an earlier encounter on 08/05/23: 5\' 1"  (1.549 m).   Weight as of an earlier encounter on 08/05/23: 148 lb (67.1 kg).   PERFORMANCE STATUS (ECOG) : 1 - Symptomatic but completely ambulatory   Physical Exam General: NAD Cardiovascular: regular rate and rhythm Pulmonary: normal breathing pattern Extremities: no edema, no joint deformities Skin: no rashes Neurological: AAO x3  IMPRESSION: Discussed the use of AI scribe software for clinical note transcription with the patient, who gave verbal consent to proceed.  History of Present Illness Yvette Curry is a 68 year old female with lymphedema who presents for follow-up on her condition. She is accompanied by her husband. Denies concerns with nausea, vomiting, constipation, or diarrhea. She is remaining active.   She has completed her lymphedema therapy and is feeling well. She is planning to utilize compression sleeves, which was approved by her insurance for further support. The swelling in her legs has decreased significantly. She has received a new type of compression garment, a flat knit, which she has not yet tried. She previously used round knit garments, which left creases in her legs.  Yvette Curry is not currently experiencing any pain or discomfort. She has discontinued gabapentin, which was previously prescribed for her lower extremities, and is not taking any medication for pain or discomfort.  I discussed the importance of continued conversation with family and their medical providers regarding overall plan of care and treatment options, ensuring decisions are within the context of the patients values and GOCs.  Assessment & Plan Lymphedema Lymphedema well-managed with reduced swelling. Therapy completed. Compression sleeves ordered. Gabapentin discontinued. Aware of option to resume if symptoms recur. - Ensure availability of compression sleeves as needed. - Document gabapentin as not currently taken, available for future  use if swelling returns.  Medication Management Self-discontinued gabapentin and another medication. Prefers minimal medication use. No adverse effects from discontinuation. Aware of option to resume if symptoms recur. - Update medication list to reflect current usage, move discontinued medications to history.  Follow-up Doing well overall with no new concerns. - Schedule follow-up appointment in 6-8 weeks to reassess condition and needs.  Patient expressed understanding and was in agreement with this plan. She also understands that She can call the clinic at any time with any questions, concerns, or complaints.   Any controlled substances utilized were prescribed in the context of palliative care. PDMP has been reviewed.   Visit consisted of counseling and education dealing with the complex and emotionally intense issues of symptom management and palliative care in the setting of serious and potentially life-threatening illness.  Dellia Ferguson, AGPCNP-BC  Palliative Medicine Team/[facility]

## 2023-08-05 NOTE — Addendum Note (Signed)
 Addended by: Bonnie Butters on: 08/05/2023 11:40 AM   Modules accepted: Orders
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
## 2023-08-17 ENCOUNTER — Ambulatory Visit (INDEPENDENT_AMBULATORY_CARE_PROVIDER_SITE_OTHER): Payer: Medicare Other | Admitting: Family Medicine

## 2023-08-17 VITALS — BP 120/76 | HR 98 | Temp 97.7°F | Ht 61.0 in | Wt 149.2 lb

## 2023-08-17 DIAGNOSIS — C3491 Malignant neoplasm of unspecified part of right bronchus or lung: Secondary | ICD-10-CM

## 2023-08-17 DIAGNOSIS — J439 Emphysema, unspecified: Secondary | ICD-10-CM | POA: Diagnosis not present

## 2023-08-17 DIAGNOSIS — Z720 Tobacco use: Secondary | ICD-10-CM | POA: Diagnosis not present

## 2023-08-17 DIAGNOSIS — R600 Localized edema: Secondary | ICD-10-CM

## 2023-08-17 NOTE — Patient Instructions (Signed)
 Healthy diet.  Continue working on smoking cessation.  Follow up in 6 months.

## 2023-08-18 ENCOUNTER — Ambulatory Visit (HOSPITAL_COMMUNITY)
Admission: RE | Admit: 2023-08-18 | Discharge: 2023-08-18 | Disposition: A | Discharge status: Home / Self Care | Source: Ambulatory Visit | Attending: Internal Medicine | Admitting: Internal Medicine

## 2023-08-18 ENCOUNTER — Encounter (HOSPITAL_COMMUNITY): Payer: Self-pay

## 2023-08-18 DIAGNOSIS — C3490 Malignant neoplasm of unspecified part of unspecified bronchus or lung: Secondary | ICD-10-CM | POA: Diagnosis present

## 2023-08-18 MED ORDER — HEPARIN SOD (PORK) LOCK FLUSH 100 UNIT/ML IV SOLN
INTRAVENOUS | Status: AC
  Filled 2023-08-18: qty 5

## 2023-08-18 MED ORDER — IOHEXOL 300 MG/ML  SOLN
100.0000 mL | Freq: Once | INTRAMUSCULAR | Status: AC | PRN
Start: 2023-08-18 — End: 2023-08-18
  Administered 2023-08-18: 100 mL via INTRAVENOUS

## 2023-08-18 MED ORDER — HEPARIN SOD (PORK) LOCK FLUSH 100 UNIT/ML IV SOLN
500.0000 [IU] | Freq: Once | INTRAVENOUS | Status: AC
  Administered 2023-08-18: 500 [IU] via INTRAVENOUS

## 2023-08-18 MED ORDER — SODIUM CHLORIDE (PF) 0.9 % IJ SOLN
INTRAMUSCULAR | Status: AC
  Filled 2023-08-18: qty 50

## 2023-08-18 NOTE — Assessment & Plan Note (Signed)
 Patient is feeling well.  Has upcoming CT scan.

## 2023-08-18 NOTE — Assessment & Plan Note (Signed)
 Stable on Breztri .  Continue.  There is no evidence of thrush today.

## 2023-08-18 NOTE — Assessment & Plan Note (Signed)
 We discussed this today.  Advised cessation.

## 2023-08-18 NOTE — Assessment & Plan Note (Signed)
 No significant edema on exam.  No evidence of cellulitis.  Supportive care.

## 2023-08-18 NOTE — Progress Notes (Signed)
 Subjective:  Patient ID: Yvette Curry, female    DOB: [birth year]  Age: 68 y.o. MRN: [id number]  CC: Follow-up   HPI:  68 year old female with emphysema, coronary disease, history of heart failure, stage IV adenocarcinoma of the right lung, tobacco abuse, lower extremity edema and history of cellulitis presents for follow-up.  Patient accompanied by her husband today.  Patient states that overall she is doing well.  She would like me to examine her right lower extremity as she states that she has had some redness and given her history of cellulitis that she is concerned.  Patient reports a recent burning sensation in her mouth.  She is concerned that she may have thrush.  Patient still smoking.  Will revisit today.  BP stable.  Has a CT scheduled for follow-up regarding lung cancer.  Patient Active Problem List   Diagnosis Date Noted   Lower extremity edema 05/21/2023   Dysphagia 05/14/2023   Gastroesophageal reflux disease 05/14/2023   Pericardial effusion 04/10/2023   Bilateral pleural effusion 03/30/2023   Encounter for antineoplastic immunotherapy 03/11/2023   CAD (coronary artery disease) 02/17/2023   Ascending aorta dilation (HCC) 02/17/2023   Macrocytic anemia 12/10/2022   Heart failure with improved ejection fraction (HFimpEF) (HCC) 07/01/2022   Aortic atherosclerosis (HCC) 06/04/2022   Emphysema lung (HCC) 06/04/2022   Neutropenia (HCC) 03/27/2022   Adenocarcinoma of right lung, stage 4 (HCC) 12/12/2021   Tobacco abuse 11/06/2014    Social Hx   Social History   Socioeconomic History   Marital status: Married    Spouse name: [name]   Number of children: Y   Years of education: Not on file   Highest education level: 12th grade  Occupational History   Occupation: Personnel officer: UNEMPLOYED  Tobacco Use   Smoking status: Every Day    Current packs/day: 0.50    Average packs/day: 0.5 packs/day for 37.0 years (18.5 ttl pk-yrs)    Types: Cigarettes     Passive exposure: Current   Smokeless tobacco: Never   Tobacco comments:    Smokes half a pack of cigarettes a day. 12/18/2022 Tay  Vaping Use   Vaping status: Former  Substance and Sexual Activity   Alcohol  use: No   Drug use: No   Sexual activity: Yes    Comment: hysterectomy  Other Topics Concern   Not on file  Social History Narrative   Not on file   Social Drivers of Health   Financial Resource Strain: Low Risk  (06/19/2023)   Overall Financial Resource Strain (CARDIA)    Difficulty of Paying Living Expenses: Not hard at all  Food Insecurity: No Food Insecurity (06/19/2023)   Hunger Vital Sign    Worried About Running Out of Food in the Last Year: Never true    Ran Out of Food in the Last Year: Never true  Transportation Needs: No Transportation Needs (06/19/2023)   PRAPARE - Administrator, Civil Service (Medical): No    Lack of Transportation (Non-Medical): No  Physical Activity: Inactive (06/19/2023)   Exercise Vital Sign    Days of Exercise per Week: 0 days    Minutes of Exercise per Session: 0 min  Stress: No Stress Concern Present (06/19/2023)   [employer] of Occupational Health - Occupational Stress Questionnaire    Feeling of Stress : Not at all  Social Connections: Moderately Isolated (06/19/2023)   Social Connection and Isolation Panel [NHANES]    Frequency of Communication with Friends  and Family: More than three times a week    Frequency of Social Gatherings with Friends and Family: More than three times a week    Attends Religious Services: Never    Database administrator or Organizations: No    Attends Engineer, structural: Never    Marital Status: Married    Review of Systems Per HPI  Objective:  BP 120/76   Pulse 98   Temp 97.7 F (36.5 C)   Ht 5\' 1"  (1.549 m)   Wt 149 lb 3.2 oz (67.7 kg)   SpO2 98%   BMI 28.19 kg/m      08/17/2023    1:31 PM 08/05/2023   10:45 AM 07/15/2023   10:25 AM  BP/Weight  Systolic BP  120 253 127  Diastolic BP 76 84 83  Wt. (Lbs) 149.2 148 150.6  BMI 28.19 kg/m2 27.96 kg/m2 28.46 kg/m2    Physical Exam Vitals and nursing note reviewed.  Constitutional:      General: She is not in acute distress.    Appearance: Normal appearance.  HENT:     Mouth/Throat:     Pharynx: Oropharynx is clear.  Eyes:     General:        Right eye: No discharge.        Left eye: No discharge.     Conjunctiva/sclera: Conjunctivae normal.  Cardiovascular:     Rate and Rhythm: Normal rate and regular rhythm.  Pulmonary:     Effort: Pulmonary effort is normal. No respiratory distress.  Skin:    Comments: Right lower extremity with chronic, mild erythema.  No significant warmth or pain to suggest cellulitis.  Neurological:     Mental Status: She is alert.  Psychiatric:        Mood and Affect: Mood normal.        Behavior: Behavior normal.     Lab Results  Component Value Date   WBC 6.3 08/05/2023   HGB 13.0 08/05/2023   HCT 38.6 08/05/2023   PLT 183 08/05/2023   GLUCOSE 92 08/05/2023   ALT 15 08/05/2023   AST 20 08/05/2023   NA 140 08/05/2023   K 4.3 08/05/2023   CL 106 08/05/2023   CREATININE 1.10 (H) 08/05/2023   BUN 22 08/05/2023   CO2 30 08/05/2023   TSH 2.240 06/24/2023   INR 1.0 03/15/2023     Assessment & Plan:  Adenocarcinoma of right lung, stage 4 (HCC) Assessment & Plan: Patient is feeling well.  Has upcoming CT scan.   Lower extremity edema Assessment & Plan: No significant edema on exam.  No evidence of cellulitis.  Supportive care.   Tobacco abuse Assessment & Plan: We discussed this today.  Advised cessation.   Pulmonary emphysema, unspecified emphysema type (HCC) Assessment & Plan: Stable on Breztri .  Continue.  There is no evidence of thrush today.     Follow-up:  Return in about 6 months (around 02/16/2024).  Kathleen Papa DO [facility]

## 2023-08-21 ENCOUNTER — Encounter (HOSPITAL_COMMUNITY): Payer: Self-pay | Admitting: Speech Pathology

## 2023-08-21 ENCOUNTER — Ambulatory Visit (HOSPITAL_COMMUNITY): Attending: Gastroenterology | Admitting: Speech Pathology

## 2023-08-21 ENCOUNTER — Ambulatory Visit (HOSPITAL_COMMUNITY)
Admission: RE | Admit: 2023-08-21 | Discharge: 2023-08-21 | Disposition: A | Discharge status: Home / Self Care | Source: Ambulatory Visit | Attending: Gastroenterology | Admitting: Gastroenterology

## 2023-08-21 DIAGNOSIS — T17308A Unspecified foreign body in larynx causing other injury, initial encounter: Secondary | ICD-10-CM | POA: Diagnosis not present

## 2023-08-21 DIAGNOSIS — R1312 Dysphagia, oropharyngeal phase: Secondary | ICD-10-CM | POA: Insufficient documentation

## 2023-08-21 DIAGNOSIS — K219 Gastro-esophageal reflux disease without esophagitis: Secondary | ICD-10-CM | POA: Diagnosis present

## 2023-08-21 DIAGNOSIS — R059 Cough, unspecified: Secondary | ICD-10-CM | POA: Diagnosis present

## 2023-08-21 NOTE — Progress Notes (Unsigned)
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  She presented with a right upper lobe pulmonary nodule in addition to widespread metastatic adenopathy with the ipsilateral hilum, bilateral mediastinal, supraclavicular, left axillary, and mesenteric lymph nodes.  She was diagnosed in August 2023.  There was insufficient material for molecular studies but [employer] showed no actionable mutations.   She is currently undergoing systemic palliative chemotherapy with carboplatin  for an AUC of 5, Alimta 500 mg/m, and immunotherapy with Keytruda  200 mg IV every 3 weeks.  Starting from cycle #5, she started maintenance Alimta and Keytruda .  she is  status post 29 cycles of treatment. Alimta was discontinued from cycle #23 due to frequent infections (cellulitis).   The patient was seen with Dr. Marguerita Shih today.  Dr. Marguerita Shih personally and independently reviewed the scan and discussed results with the patient today.  The scan showed no evidence of disease progression except a persistent left axillary lymphadenopathy which is mildly enlarged.  Dr. Marguerita Shih recommends close monitoring for now. We can always consider radiation but concern that it would cause lymphedema in the left upper extremity. Therefore we will monitor for now.     The patient tolerating immunotherapy well with less fatigue and nausea compared to previous chemotherapy regimen. Continue current immunotherapy regimen. Labs were reviewed. Recommend she proceed with cycle #30 today as scheduled.    We will see her back for labs and follow up in 3 weeks before starting cycle #31.  She is wondering if she needs to continue taking iron. I told her I would be happy to recheck at her next appointment. If normal, she can discontinue her iron.     The patient was advised to call immediately if she has any concerning symptoms in the interval. The patient voices understanding of current disease status and treatment  options and is in agreement with the current care plan. All questions were answered. The patient knows to call the clinic with any problems, questions or concerns. We can certainly see the patient much sooner if necessary   No orders of the defined types were placed in this encounter.   Rex Magee L Topaz Raglin, PA-C 08/21/23  ADDENDUM: Hematology/Oncology Attending: I had a face-to-face encounter with the patient today.  I reviewed her records, lab, scan and recommended her care plan.  This is a very pleasant 68 years old white female with a stage IV non-small cell lung cancer, adenocarcinoma diagnosed in August 2023 with no actionable mutations.  She started palliative  systemic chemotherapy with carboplatin , Alimta and Keytruda  for 4 cycles and starting from cycle #5 she has been on maintenance treatment with Alimta and Keytruda  until recently when Alimta was discontinued secondary to frequent cellulitis send The patient is currently on maintenance treatment with single agent Keytruda  status post total of 29 cycles.  She has been tolerating this treatment much better with no concerning complaints.  She has less fatigue and shortness of breath than before.  She had repeat CT scan of the chest, abdomen and pelvis performed recently.  I personally and independently reviewed the scan and discussed the result with the patient and her husband.  Her scan showed no concerning findings for disease progression except for mild increase in the size of left axillary lymph node that likely to be related to her disease based on the previous hypermetabolic activity in this area but we will continue to monitor it closely for now.  I discussed with the patient treatment with palliative radiotherapy to this area or lymph node dissection but there is concern about the possibility of lymphedema in the left upper extremity.  She will continue her current treatment with Keytruda  for now and we will monitor her closely on the upcoming imaging studies. The patient was advised to call immediately if she has any other concerning symptoms in the interval. The total time spent in the appointment was 30 minutes. Disclaimer: This note was dictated with voice recognition software. Similar sounding words can inadvertently be transcribed and may be missed upon review. Aurelio Blower, MD
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
## 2023-08-26 ENCOUNTER — Inpatient Hospital Stay (HOSPITAL_BASED_OUTPATIENT_CLINIC_OR_DEPARTMENT_OTHER): Admitting: Physician Assistant

## 2023-08-26 ENCOUNTER — Inpatient Hospital Stay: Attending: Internal Medicine

## 2023-08-26 ENCOUNTER — Inpatient Hospital Stay

## 2023-08-26 VITALS — BP 133/80 | HR 84 | Temp 97.2°F | Resp 15 | Wt 148.4 lb

## 2023-08-26 DIAGNOSIS — Z79899 Other long term (current) drug therapy: Secondary | ICD-10-CM | POA: Insufficient documentation

## 2023-08-26 DIAGNOSIS — C3491 Malignant neoplasm of unspecified part of right bronchus or lung: Secondary | ICD-10-CM

## 2023-08-26 DIAGNOSIS — D649 Anemia, unspecified: Secondary | ICD-10-CM | POA: Insufficient documentation

## 2023-08-26 DIAGNOSIS — F17210 Nicotine dependence, cigarettes, uncomplicated: Secondary | ICD-10-CM | POA: Diagnosis not present

## 2023-08-26 DIAGNOSIS — C3411 Malignant neoplasm of upper lobe, right bronchus or lung: Secondary | ICD-10-CM | POA: Diagnosis present

## 2023-08-26 DIAGNOSIS — C778 Secondary and unspecified malignant neoplasm of lymph nodes of multiple regions: Secondary | ICD-10-CM | POA: Diagnosis present

## 2023-08-26 DIAGNOSIS — Z9226 Personal history of immune checkpoint inhibitor therapy: Secondary | ICD-10-CM | POA: Diagnosis not present

## 2023-08-26 DIAGNOSIS — Z5112 Encounter for antineoplastic immunotherapy: Secondary | ICD-10-CM

## 2023-08-26 DIAGNOSIS — D701 Agranulocytosis secondary to cancer chemotherapy: Secondary | ICD-10-CM

## 2023-08-26 LAB — CMP (CANCER CENTER ONLY)
ALT: 12 U/L (ref 0–44)
AST: 16 U/L (ref 15–41)
Albumin: 3.9 g/dL (ref 3.5–5.0)
Alkaline Phosphatase: 62 U/L (ref 38–126)
Anion gap: 7 (ref 5–15)
BUN: 18 mg/dL (ref 8–23)
CO2: 30 mmol/L (ref 22–32)
Calcium: 9.9 mg/dL (ref 8.9–10.3)
Chloride: 105 mmol/L (ref 98–111)
Creatinine: 1.1 mg/dL — ABNORMAL HIGH (ref 0.44–1.00)
GFR, Estimated: 55 mL/min — ABNORMAL LOW (ref >60)
Glucose, Bld: 107 mg/dL — ABNORMAL HIGH (ref 70–99)
Potassium: 4.3 mmol/L (ref 3.5–5.1)
Sodium: 142 mmol/L (ref 135–145)
Total Bilirubin: 0.4 mg/dL (ref 0.0–1.2)
Total Protein: 6.9 g/dL (ref 6.5–8.1)

## 2023-08-26 LAB — CBC WITH DIFFERENTIAL (CANCER CENTER ONLY)
Abs Immature Granulocytes: 0.02 10*3/uL (ref 0.00–0.07)
Basophils Absolute: 0.1 10*3/uL (ref 0.0–0.1)
Basophils Relative: 1 %
Eosinophils Absolute: 0.3 10*3/uL (ref 0.0–0.5)
Eosinophils Relative: 4 %
HCT: 37.7 % (ref 36.0–46.0)
Hemoglobin: 12.7 g/dL (ref 12.0–15.0)
Immature Granulocytes: 0 %
Lymphocytes Relative: 31 %
Lymphs Abs: 1.8 10*3/uL (ref 0.7–4.0)
MCH: 32.3 pg (ref 26.0–34.0)
MCHC: 33.7 g/dL (ref 30.0–36.0)
MCV: 95.9 fL (ref 80.0–100.0)
Monocytes Absolute: 0.5 10*3/uL (ref 0.1–1.0)
Monocytes Relative: 9 %
Neutro Abs: 3.2 10*3/uL (ref 1.7–7.7)
Neutrophils Relative %: 55 %
Platelet Count: 206 10*3/uL (ref 150–400)
RBC: 3.93 MIL/uL (ref 3.87–5.11)
RDW: 14.4 % (ref 11.5–15.5)
WBC Count: 5.8 10*3/uL (ref 4.0–10.5)
nRBC: 0 % (ref 0.0–0.2)

## 2023-08-26 LAB — TSH: TSH: 2.21 u[IU]/mL (ref 0.350–4.500)

## 2023-08-26 MED ORDER — SODIUM CHLORIDE 0.9 % IV SOLN
200.0000 mg | Freq: Once | INTRAVENOUS | Status: AC
  Administered 2023-08-26: 200 mg via INTRAVENOUS
  Filled 2023-08-26: qty 200

## 2023-08-26 MED ORDER — SODIUM CHLORIDE 0.9 % IV SOLN
Freq: Once | INTRAVENOUS | Status: AC
Start: 2023-08-26 — End: 2023-08-26

## 2023-08-26 MED ORDER — SODIUM CHLORIDE 0.9% FLUSH
10.0000 mL | Freq: Once | INTRAVENOUS | Status: AC
  Administered 2023-08-26: 10 mL

## 2023-08-26 NOTE — Patient Instructions (Signed)
 [facility] - A DEPT OF [facility]  Discharge Instructions: Thank you for choosing [facility] to provide your oncology and hematology care.   If you have a lab appointment with the Cancer Center, please go directly to the Cancer Center and check in at the registration area.   Wear comfortable clothing and clothing appropriate for easy access to any Portacath or PICC line.   We strive to give you quality time with your provider. You may need to reschedule your appointment if you arrive late (15 or more minutes).  Arriving late affects you and other patients whose appointments are after yours.  Also, if you miss three or more appointments without notifying the office, you may be dismissed from the clinic at the provider's discretion.      For prescription refill requests, have your pharmacy contact our office and allow 72 hours for refills to be completed.    Today you received the following chemotherapy and/or immunotherapy agents :  Pembrolizumab       To help prevent nausea and vomiting after your treatment, we encourage you to take your nausea medication as directed.  BELOW ARE SYMPTOMS THAT SHOULD BE REPORTED IMMEDIATELY: *FEVER GREATER THAN 100.4 F (38 C) OR HIGHER *CHILLS OR SWEATING *NAUSEA AND VOMITING THAT IS NOT CONTROLLED WITH YOUR NAUSEA MEDICATION *UNUSUAL SHORTNESS OF BREATH *UNUSUAL BRUISING OR BLEEDING *URINARY PROBLEMS (pain or burning when urinating, or frequent urination) *BOWEL PROBLEMS (unusual diarrhea, constipation, pain near the anus) TENDERNESS IN MOUTH AND THROAT WITH OR WITHOUT PRESENCE OF ULCERS (sore throat, sores in mouth, or a toothache) UNUSUAL RASH, SWELLING OR PAIN  UNUSUAL VAGINAL DISCHARGE OR ITCHING   Items with * indicate a potential emergency and should be followed up as soon as possible or go to the Emergency Department if any problems should occur.  Please show the CHEMOTHERAPY ALERT CARD or  IMMUNOTHERAPY ALERT CARD at check-in to the Emergency Department and triage nurse.  Should you have questions after your visit or need to cancel or reschedule your appointment, please contact [facility] - A DEPT OF [facility]  Dept: [phone]  and follow the prompts.  Office hours are 8:00 a.m. to 4:30 p.m. Monday - Friday. Please note that voicemails left after 4:00 p.m. may not be returned until the following business day.  We are closed weekends and major holidays. You have access to a nurse at all times for urgent questions. Please call the main number to the clinic Dept: [phone] and follow the prompts.   For any non-urgent questions, you may also contact your provider using MyChart. We now offer e-Visits for anyone 21 and older to request care online for non-urgent symptoms. For details visit [email or web address].   Also download the MyChart app! Go to the app store, search "MyChart", open the app, select [place], and log in with your MyChart username and password.

## 2023-08-27 LAB — T4: T4, Total: 8.5 ug/dL (ref 4.5–12.0)
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
## 2023-09-04 ENCOUNTER — Encounter (INDEPENDENT_AMBULATORY_CARE_PROVIDER_SITE_OTHER): Payer: Self-pay | Admitting: Ophthalmology

## 2023-09-04 ENCOUNTER — Ambulatory Visit (INDEPENDENT_AMBULATORY_CARE_PROVIDER_SITE_OTHER): Admitting: Ophthalmology

## 2023-09-04 DIAGNOSIS — H353211 Exudative age-related macular degeneration, right eye, with active choroidal neovascularization: Secondary | ICD-10-CM | POA: Diagnosis not present

## 2023-09-04 DIAGNOSIS — Z961 Presence of intraocular lens: Secondary | ICD-10-CM | POA: Diagnosis not present

## 2023-09-04 DIAGNOSIS — H04123 Dry eye syndrome of bilateral lacrimal glands: Secondary | ICD-10-CM | POA: Diagnosis not present

## 2023-09-04 DIAGNOSIS — H35711 Central serous chorioretinopathy, right eye: Secondary | ICD-10-CM | POA: Diagnosis not present

## 2023-09-04 DIAGNOSIS — C3491 Malignant neoplasm of unspecified part of right bronchus or lung: Secondary | ICD-10-CM

## 2023-09-04 MED ORDER — BEVACIZUMAB CHEMO INJECTION 1.25MG/0.05ML SYRINGE FOR KALEIDOSCOPE
1.2500 mg | INTRAVITREAL | Status: AC | PRN
  Administered 2023-09-04: 1.25 mg via INTRAVITREAL

## 2023-09-07 ENCOUNTER — Ambulatory Visit: Payer: Medicare Other | Attending: Surgery | Admitting: Physician Assistant

## 2023-09-07 VITALS — BP 115/77 | HR 88 | Temp 98.0°F | Wt 150.2 lb

## 2023-09-07 DIAGNOSIS — I872 Venous insufficiency (chronic) (peripheral): Secondary | ICD-10-CM | POA: Diagnosis not present

## 2023-09-08 ENCOUNTER — Ambulatory Visit (INDEPENDENT_AMBULATORY_CARE_PROVIDER_SITE_OTHER): Admitting: Family Medicine

## 2023-09-08 ENCOUNTER — Encounter (INDEPENDENT_AMBULATORY_CARE_PROVIDER_SITE_OTHER): Payer: Self-pay | Admitting: Ophthalmology

## 2023-09-08 ENCOUNTER — Ambulatory Visit (HOSPITAL_COMMUNITY)
Admission: RE | Admit: 2023-09-08 | Discharge: 2023-09-08 | Disposition: A | Discharge status: Home / Self Care | Source: Ambulatory Visit | Attending: Family Medicine | Admitting: Family Medicine

## 2023-09-08 ENCOUNTER — Encounter: Payer: Self-pay | Admitting: Family Medicine

## 2023-09-08 VITALS — BP 136/88 | HR 86 | Temp 98.1°F | Ht 61.0 in | Wt 149.0 lb

## 2023-09-08 DIAGNOSIS — R051 Acute cough: Secondary | ICD-10-CM | POA: Diagnosis present

## 2023-09-08 DIAGNOSIS — J441 Chronic obstructive pulmonary disease with (acute) exacerbation: Secondary | ICD-10-CM | POA: Diagnosis not present

## 2023-09-08 MED ORDER — AMOXICILLIN-POT CLAVULANATE 875-125 MG PO TABS: 1.0000 | ORAL_TABLET | Freq: Two times a day (BID) | ORAL | 0 refills | Status: DC

## 2023-09-08 MED ORDER — PROMETHAZINE-DM 6.25-15 MG/5ML PO SYRP: 5.0000 mL | ORAL_SOLUTION | Freq: Four times a day (QID) | ORAL | 0 refills | Status: DC | PRN

## 2023-09-08 NOTE — Progress Notes (Signed)
 Subjective:  Patient ID: Yvette Curry, female    DOB: 10/19/1955  Age: 68 y.o. MRN: [id number]  CC:   Chief Complaint  Patient presents with   Cough    Cough and congestion x's 11 days      HPI:  68 year old female with the below mentioned medical problems presents for evaluation of the above.  10 to 11-day history of cough and congestion.  No fever.  She has tried Mucinex and Robitussin as well as Claritin without relief.  Follows closely with oncology given current neoplasm.  She reports associated sore throat.  No other associated symptoms.  No other complaints.  Patient Active Problem List   Diagnosis Date Noted   COPD exacerbation (HCC) 09/08/2023   Lower extremity edema 05/21/2023   Dysphagia 05/14/2023   Gastroesophageal reflux disease 05/14/2023   Pericardial effusion 04/10/2023   Bilateral pleural effusion 03/30/2023   Encounter for antineoplastic immunotherapy 03/11/2023   CAD (coronary artery disease) 02/17/2023   Ascending aorta dilation (HCC) 02/17/2023   Macrocytic anemia 12/10/2022   Heart failure with improved ejection fraction (HFimpEF) (HCC) 07/01/2022   Aortic atherosclerosis (HCC) 06/04/2022   Emphysema lung (HCC) 06/04/2022   Neutropenia (HCC) 03/27/2022   Adenocarcinoma of right lung, stage 4 (HCC) 12/12/2021   Tobacco abuse 11/06/2014    Social Hx   Social History   Socioeconomic History   Marital status: Married    Spouse name: [name]   Number of children: Y   Years of education: Not on file   Highest education level: 12th grade  Occupational History   Occupation: Personnel officer: UNEMPLOYED  Tobacco Use   Smoking status: Every Day    Current packs/day: 0.50    Average packs/day: 0.5 packs/day for 37.0 years (18.5 ttl pk-yrs)    Types: Cigarettes    Passive exposure: Current   Smokeless tobacco: Never   Tobacco comments:    Smokes half a pack of cigarettes a day. 12/18/2022 Tay  Vaping Use   Vaping status: Former  Substance  and Sexual Activity   Alcohol  use: No   Drug use: No   Sexual activity: Yes    Comment: hysterectomy  Other Topics Concern   Not on file  Social History Narrative   Not on file   Social Drivers of Health   Financial Resource Strain: Low Risk  (06/19/2023)   Overall Financial Resource Strain (CARDIA)    Difficulty of Paying Living Expenses: Not hard at all  Food Insecurity: No Food Insecurity (06/19/2023)   Hunger Vital Sign    Worried About Running Out of Food in the Last Year: Never true    Ran Out of Food in the Last Year: Never true  Transportation Needs: No Transportation Needs (06/19/2023)   PRAPARE - Administrator, Civil Service (Medical): No    Lack of Transportation (Non-Medical): No  Physical Activity: Inactive (06/19/2023)   Exercise Vital Sign    Days of Exercise per Week: 0 days    Minutes of Exercise per Session: 0 min  Stress: No Stress Concern Present (06/19/2023)   [employer] of Occupational Health - Occupational Stress Questionnaire    Feeling of Stress : Not at all  Social Connections: Moderately Isolated (06/19/2023)   Social Connection and Isolation Panel [NHANES]    Frequency of Communication with Friends and Family: More than three times a week    Frequency of Social Gatherings with Friends and Family: More than three times a week  Attends Religious Services: Never    Active Member of Clubs or Organizations: No    Attends Banker Meetings: Never    Marital Status: Married    Review of Systems Per HPI  Objective:  BP 136/88   Pulse 86   Temp 98.1 F (36.7 C)   Ht 5\' 1"  (1.549 m)   Wt 149 lb (67.6 kg)   SpO2 96%   BMI 28.15 kg/m      09/08/2023   11:05 AM 09/08/2023   10:40 AM 09/07/2023   10:06 AM  BP/Weight  Systolic BP 136 149 115  Diastolic BP 88 107 77  Wt. (Lbs)  149 150.2  BMI  28.15 kg/m2 28.38 kg/m2    Physical Exam Vitals and nursing note reviewed.  Constitutional:      General: She is not in  acute distress.    Appearance: Normal appearance.  HENT:     Head: Normocephalic and atraumatic.  Cardiovascular:     Rate and Rhythm: Normal rate and regular rhythm.  Pulmonary:     Effort: Pulmonary effort is normal.     Breath sounds: Rales present. No wheezing.  Neurological:     Mental Status: She is alert.     Lab Results  Component Value Date   WBC 5.8 08/26/2023   HGB 12.7 08/26/2023   HCT 37.7 08/26/2023   PLT 206 08/26/2023   GLUCOSE 107 (H) 08/26/2023   ALT 12 08/26/2023   AST 16 08/26/2023   NA 142 08/26/2023   K 4.3 08/26/2023   CL 105 08/26/2023   CREATININE 1.10 (H) 08/26/2023   BUN 18 08/26/2023   CO2 30 08/26/2023   TSH 2.210 08/26/2023   INR 1.0 03/15/2023     Assessment & Plan:  COPD exacerbation (HCC) Assessment & Plan: Chest x-ray was negative for acute infiltrate.  Treating with Augmentin .  Will normally placed on corticosteroids but patient has an allergy.  Promethazine  DM for cough.  Orders: -     Amoxicillin -Pot Clavulanate; Take 1 tablet by mouth 2 (two) times daily.  Dispense: 14 tablet; Refill: 0 -     Promethazine -DM; Take 5 mLs by mouth 4 (four) times daily as needed for cough.  Dispense: 118 mL; Refill: 0  Acute cough -     DG Chest 2 View -     Promethazine -DM; Take 5 mLs by mouth 4 (four) times daily as needed for cough.  Dispense: 118 mL; Refill: 0    Follow-up:  Return if symptoms worsen or fail to improve.  Kathleen Papa DO [facility]

## 2023-09-08 NOTE — Assessment & Plan Note (Signed)
 Chest x-ray was negative for acute infiltrate.  Treating with Augmentin .  Will normally placed on corticosteroids but patient has an allergy.  Promethazine  DM for cough.
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
## 2023-09-08 NOTE — Patient Instructions (Signed)
 Yvette Curry with call with the results and treatment plan.  Take care  Dr. Debrah Fan

## 2023-09-15 ENCOUNTER — Other Ambulatory Visit: Payer: Self-pay | Admitting: Family Medicine

## 2023-09-15 DIAGNOSIS — J441 Chronic obstructive pulmonary disease with (acute) exacerbation: Secondary | ICD-10-CM

## 2023-09-15 DIAGNOSIS — R051 Acute cough: Secondary | ICD-10-CM

## 2023-09-16 ENCOUNTER — Other Ambulatory Visit: Payer: Self-pay | Admitting: *Deleted

## 2023-09-16 ENCOUNTER — Inpatient Hospital Stay (HOSPITAL_BASED_OUTPATIENT_CLINIC_OR_DEPARTMENT_OTHER): Admitting: Nurse Practitioner

## 2023-09-16 ENCOUNTER — Inpatient Hospital Stay (HOSPITAL_BASED_OUTPATIENT_CLINIC_OR_DEPARTMENT_OTHER): Admitting: Internal Medicine

## 2023-09-16 ENCOUNTER — Inpatient Hospital Stay

## 2023-09-16 ENCOUNTER — Encounter: Payer: Self-pay | Admitting: Nurse Practitioner

## 2023-09-16 VITALS — BP 153/88 | HR 90 | Temp 98.3°F | Resp 16 | Ht 61.0 in | Wt 148.3 lb

## 2023-09-16 VITALS — BP 135/80

## 2023-09-16 DIAGNOSIS — D649 Anemia, unspecified: Secondary | ICD-10-CM

## 2023-09-16 DIAGNOSIS — C3491 Malignant neoplasm of unspecified part of right bronchus or lung: Secondary | ICD-10-CM

## 2023-09-16 DIAGNOSIS — R530 Neoplastic (malignant) related fatigue: Secondary | ICD-10-CM

## 2023-09-16 DIAGNOSIS — Z515 Encounter for palliative care: Secondary | ICD-10-CM | POA: Diagnosis not present

## 2023-09-16 DIAGNOSIS — Z5112 Encounter for antineoplastic immunotherapy: Secondary | ICD-10-CM | POA: Diagnosis not present

## 2023-09-16 DIAGNOSIS — R052 Subacute cough: Secondary | ICD-10-CM | POA: Diagnosis not present

## 2023-09-16 DIAGNOSIS — T451X5A Adverse effect of antineoplastic and immunosuppressive drugs, initial encounter: Secondary | ICD-10-CM

## 2023-09-16 LAB — CBC WITH DIFFERENTIAL (CANCER CENTER ONLY)
Abs Immature Granulocytes: 0.04 10*3/uL (ref 0.00–0.07)
Basophils Absolute: 0.1 10*3/uL (ref 0.0–0.1)
Basophils Relative: 1 %
Eosinophils Absolute: 0.4 10*3/uL (ref 0.0–0.5)
Eosinophils Relative: 4 %
HCT: 38.1 % (ref 36.0–46.0)
Hemoglobin: 12.7 g/dL (ref 12.0–15.0)
Immature Granulocytes: 1 %
Lymphocytes Relative: 28 %
Lymphs Abs: 2.3 10*3/uL (ref 0.7–4.0)
MCH: 32.2 pg (ref 26.0–34.0)
MCHC: 33.3 g/dL (ref 30.0–36.0)
MCV: 96.7 fL (ref 80.0–100.0)
Monocytes Absolute: 0.6 10*3/uL (ref 0.1–1.0)
Monocytes Relative: 7 %
Neutro Abs: 4.7 10*3/uL (ref 1.7–7.7)
Neutrophils Relative %: 59 %
Platelet Count: 205 10*3/uL (ref 150–400)
RBC: 3.94 MIL/uL (ref 3.87–5.11)
RDW: 14 % (ref 11.5–15.5)
WBC Count: 8 10*3/uL (ref 4.0–10.5)
nRBC: 0 % (ref 0.0–0.2)

## 2023-09-16 LAB — CMP (CANCER CENTER ONLY)
ALT: 12 U/L (ref 0–44)
AST: 15 U/L (ref 15–41)
Albumin: 4 g/dL (ref 3.5–5.0)
Alkaline Phosphatase: 67 U/L (ref 38–126)
Anion gap: 7 (ref 5–15)
BUN: 21 mg/dL (ref 8–23)
CO2: 29 mmol/L (ref 22–32)
Calcium: 10.2 mg/dL (ref 8.9–10.3)
Chloride: 105 mmol/L (ref 98–111)
Creatinine: 0.92 mg/dL (ref 0.44–1.00)
GFR, Estimated: >60 mL/min (ref >60)
Glucose, Bld: 96 mg/dL (ref 70–99)
Potassium: 4.2 mmol/L (ref 3.5–5.1)
Sodium: 141 mmol/L (ref 135–145)
Total Bilirubin: 0.3 mg/dL (ref 0.0–1.2)
Total Protein: 6.9 g/dL (ref 6.5–8.1)

## 2023-09-16 LAB — IRON AND IRON BINDING CAPACITY (CC-WL,HP ONLY)
Iron: 73 ug/dL (ref 28–170)
Saturation Ratios: 22 % (ref 10.4–31.8)
TIBC: 340 ug/dL (ref 250–450)
UIBC: 267 ug/dL (ref 148–442)

## 2023-09-16 LAB — VITAMIN B12: Vitamin B-12: 380 pg/mL (ref 180–914)

## 2023-09-16 MED ORDER — SODIUM CHLORIDE 0.9% FLUSH: 10.0000 mL | INTRAVENOUS | Status: DC | PRN

## 2023-09-16 MED ORDER — SODIUM CHLORIDE 0.9 % IV SOLN
200.0000 mg | Freq: Once | INTRAVENOUS | Status: AC
  Administered 2023-09-16: 200 mg via INTRAVENOUS
  Filled 2023-09-16: qty 200

## 2023-09-16 MED ORDER — HEPARIN SOD (PORK) LOCK FLUSH 100 UNIT/ML IV SOLN
500.0000 [IU] | Freq: Once | INTRAVENOUS | Status: DC | PRN
Start: 2023-09-16 — End: 2023-09-16

## 2023-09-16 MED ORDER — SODIUM CHLORIDE 0.9 % IV SOLN: Freq: Once | INTRAVENOUS | Status: AC

## 2023-09-16 MED ORDER — SODIUM CHLORIDE 0.9% FLUSH
10.0000 mL | Freq: Once | INTRAVENOUS | Status: AC
  Administered 2023-09-16: 10 mL

## 2023-09-16 NOTE — Patient Instructions (Signed)
 [facility] - A DEPT OF [facility]  Discharge Instructions: Thank you for choosing [facility] to provide your oncology and hematology care.   If you have a lab appointment with the Cancer Center, please go directly to the Cancer Center and check in at the registration area.   Wear comfortable clothing and clothing appropriate for easy access to any Portacath or PICC line.   We strive to give you quality time with your provider. You may need to reschedule your appointment if you arrive late (15 or more minutes).  Arriving late affects you and other patients whose appointments are after yours.  Also, if you miss three or more appointments without notifying the office, you may be dismissed from the clinic at the provider's discretion.      For prescription refill requests, have your pharmacy contact our office and allow 72 hours for refills to be completed.    Today you received the following chemotherapy and/or immunotherapy agents Yvette Curry      To help prevent nausea and vomiting after your treatment, we encourage you to take your nausea medication as directed.  BELOW ARE SYMPTOMS THAT SHOULD BE REPORTED IMMEDIATELY: *FEVER GREATER THAN 100.4 F (38 C) OR HIGHER *CHILLS OR SWEATING *NAUSEA AND VOMITING THAT IS NOT CONTROLLED WITH YOUR NAUSEA MEDICATION *UNUSUAL SHORTNESS OF BREATH *UNUSUAL BRUISING OR BLEEDING *URINARY PROBLEMS (pain or burning when urinating, or frequent urination) *BOWEL PROBLEMS (unusual diarrhea, constipation, pain near the anus) TENDERNESS IN MOUTH AND THROAT WITH OR WITHOUT PRESENCE OF ULCERS (sore throat, sores in mouth, or a toothache) UNUSUAL RASH, SWELLING OR PAIN  UNUSUAL VAGINAL DISCHARGE OR ITCHING   Items with * indicate a potential emergency and should be followed up as soon as possible or go to the Emergency Department if any problems should occur.  Please show the CHEMOTHERAPY ALERT CARD or IMMUNOTHERAPY  ALERT CARD at check-in to the Emergency Department and triage nurse.  Should you have questions after your visit or need to cancel or reschedule your appointment, please contact [facility] - A DEPT OF [facility]  Dept: [phone]  and follow the prompts.  Office hours are 8:00 a.m. to 4:30 p.m. Monday - Friday. Please note that voicemails left after 4:00 p.m. may not be returned until the following business day.  We are closed weekends and major holidays. You have access to a nurse at all times for urgent questions. Please call the main number to the clinic Dept: [phone] and follow the prompts.   For any non-urgent questions, you may also contact your provider using MyChart. We now offer e-Visits for anyone 85 and older to request care online for non-urgent symptoms. For details visit [email or web address].   Also download the MyChart app! Go to the app store, search "MyChart", open the app, select , and log in with your MyChart username and password.

## 2023-09-16 NOTE — Progress Notes (Signed)
 Palliative Medicine [facility]  [phone] [phone]   Name: Yvette Curry Date: 09/16/2023 MRN: [id number]  DOB: [birth year]  Patient Care Team: Myrna Ast, DO as PCP - General (Family Medicine) Mallipeddi, Kennyth Pean, MD as PCP - Cardiology (Cardiology) Samson Croak, MD as Consulting Physician (Urology) Pickenpack-Cousar, Giles Labrum, NP as Nurse Practitioner (Hospice and Palliative Medicine) Evander Hills, PA-C as Physician Assistant (Gastroenterology) Pickenpack-Cousar, Giles Labrum, NP as Nurse Practitioner (Hospice and Palliative Medicine) Heilingoetter, Leita Purdue, PA-C as Physician Assistant (Physician Assistant) Ronelle Coffee, MD as Consulting Physician (Ophthalmology) Umberto Ganong, Bearl Limes, MD as Consulting Physician (Gastroenterology) Margherita Shell, MD as Consulting Physician (Vascular Surgery) Marlene Simas, MD as Consulting Physician (Oncology)    INTERVAL HISTORY: Yvette Curry is a 68 y.o. 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 Palliative ask to see for symptom management and goals of care.   SOCIAL HISTORY:     reports that she has been smoking cigarettes. She has a 18.5 pack-year smoking history. She has been exposed to tobacco smoke. She has never used smokeless tobacco. She reports that she does not drink alcohol  and does not use drugs.  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  MEDICATIONS:  Current Outpatient Medications  Medication Sig Dispense Refill   amoxicillin -clavulanate (AUGMENTIN ) 875-125 MG tablet Take 1 tablet by mouth 2 (two) times daily. 14 tablet 0   ascorbic acid  (VITAMIN C ) 500 MG tablet Take 1,000 mg by mouth daily.     Budeson-Glycopyrrol-Formoterol  (BREZTRI  AEROSPHERE) 160-9-4.8 MCG/ACT AERO Inhale 2 puffs into the lungs in the morning and at bedtime. 10.7 g 3   carboxymethylcellulose 1 % ophthalmic solution Apply 1-2 drops to eye as needed (dry eye).     Cholecalciferol (VITAMIN D ) 50 MCG (2000 UT) CAPS Take by mouth.     ferrous sulfate  324 MG TBEC Take 1 tablet (324 mg total) by mouth daily with breakfast. 30 tablet 4   Homeopathic Products (CHESTAL HONEY COUGH PO) Take by mouth.     loratadine (CLARITIN) 10 MG tablet Take 10 mg by mouth daily.     midodrine  (PROAMATINE ) 5 MG tablet Take 1 tablet (5 mg total) by mouth 3 (three) times daily as needed (If your Systolic Blood Pressure is less than 90). 90 tablet 1   pantoprazole  (PROTONIX ) 40 MG tablet TAKE 1 TABLET BY MOUTH EVERY DAY BEFORE BREAKFAST 90 tablet 1   Probiotic Product (PROBIOTIC PO) Take 1 capsule by mouth daily.     prochlorperazine  (COMPAZINE ) 10 MG tablet Take 10 mg by mouth as needed for nausea or vomiting. Every 3 weeks before chemo     promethazine -dextromethorphan (PROMETHAZINE -DM) 6.25-15 MG/5ML syrup Take 5 mLs by mouth 4 (four)  times daily as needed for cough. 118 mL 0   zinc gluconate 50 MG tablet Take 100 mg by mouth daily.     No current facility-administered medications for this visit.   Facility-Administered Medications Ordered in Other Visits  Medication Dose Route Frequency Provider Last Rate Last Admin   heparin  lock flush 100 unit/mL  500 Units Intracatheter Once PRN Marlene Simas, MD        sodium chloride  flush (NS) 0.9 % injection 10 mL  10 mL Intracatheter PRN Marlene Simas, MD        VITAL SIGNS: BP 135/80 (BP Location: Right Arm, Patient Position: Sitting)  There were no vitals filed for this visit.  Estimated body mass index is 28.02 kg/m as calculated from the following:   Height as of an earlier encounter on 09/16/23: 5\' 1"  (1.549 m).   Weight as of an earlier encounter on 09/16/23: 148 lb 4.8 oz (67.3 kg).   PERFORMANCE STATUS (ECOG) : 1 - Symptomatic but completely ambulatory   Physical Exam General: NAD Cardiovascular: regular rate and rhythm Pulmonary: normal breathing pattern, cough Extremities: no edema, no joint deformities Skin: no rashes Neurological: AAO x3  IMPRESSION: Discussed the use of AI scribe software for clinical note transcription with the patient, who gave verbal consent to proceed.  History of Present Illness Yvette Curry is a 68 year old female who presents to clinic for follow-up. Reports persistent cough and recent sinus issues.  She is accompanied by her husband Yvette Curry.  Her symptoms began during a trip to [place], [place] , with sinus congestion and a cough. Initially, she managed these symptoms with over-the-counter medications. On the following Monday, she was prescribed amoxicillin  and promethazine  cough syrup. She completed both medications, noting that the cough syrup was particularly effective. However, she experienced increased tiredness, which she attributes to the cough syrup. Symptoms improved however cough has remained persistent.   She inquired about resuming her previous medications, such as Mucinex DM or Chestal Honey cough syrup which she has been instructed to restart per Dr. Marguerita Shih. We discussed other natural interventions and use of hot teas and honey for symptoms.  She perfers Chestal Honey cough syrup, which she finds effective for her symptoms. This remedy is plant-based and free of artificial  sweeteners.  No other new symptoms noted. She is doing well overall. Denies concerns for nausea, vomiting, constipation, or diarrhea. We will continue to support and follow as needed. All questions answered.   I discussed the importance of continued conversation with family and their medical providers regarding overall plan of care and treatment options, ensuring decisions are within the context of the patients values and GOCs.  Assessment & Plan Cough and sinus issues Cough and sinus issues persisted despite initial treatment, requiring antibiotics and cough syrup. Promethazine  cough syrup was effective but caused sedation. - Resume Mucinex or Robitussin for cough management. - Use Chestal honey for cough and sinus relief as preferred by patient. - Encouraged warm liquids, tea with honey, and salt water  rinses for symptomatic relief. - Avoid promethazine  cough syrup due to sedative effects.  Follow-up Doing well overall with no new concerns. - Schedule follow-up appointment in 6-8 weeks to reassess condition and needs.  Patient expressed understanding and was in agreement with this plan. She also understands that She can call the clinic at any time with any questions, concerns, or complaints.   Any controlled substances utilized were prescribed in the context of palliative care. PDMP has been reviewed.  Visit consisted of counseling and education dealing with the complex and emotionally intense issues of symptom management and palliative care in the setting of serious and potentially life-threatening illness.  Dellia Ferguson, AGPCNP-BC  Palliative Medicine Team/[facility]

## 2023-09-16 NOTE — Progress Notes (Signed)
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 HYSTERECTOMY W/ BILATERAL SALPINGOOPHORECTOMY  07/23/1998  Dr. Charlott Converse  TUBAL LIGATION  02/28/1981 Amelia H. Vergil Glasser, CCC-SLP Speech Language Pathologist Florina Husbands 08/21/2023, 1:33 PM  CT ABDOMEN PELVIS W CONTRAST Result Date: 08/19/2023 EXAMINATION: CT ABDOMEN PELVIS W CONTRAST CLINICAL INDICATION: Female, 68 years old. Non-small cell lung cancer (NSCLC), staging TECHNIQUE: Axial CT of the abdomen and pelvis with 100 cc Omnipaque  300 intravenous contrast. Multiplanar reformations provided. Unless otherwise specified, incidental thyroid , adrenal, renal lesions do not require dedicated imaging follow up. Additionally, any mentioned  pulmonary nodules do not require dedicated imaging follow-up based on the Fleischner guidelines unless otherwise specified. Coronary calcifications are not identified unless otherwise specified. COMPARISON: 05/23/2023 FINDINGS: Regarding findings of the lung bases, please refer to the chest report. The liver appears normal. The gallbladder is normal. The spleen is normal. The pancreas is normal. The adrenals are normal. The kidneys are normal. Abdominal aorta is normal in caliber. Scattered atherosclerotic changes are present. The urinary bladder is normal. The uterus is surgically absent. There is colonic diverticulosis. Large and small bowel loops are otherwise within normal limits. There is no free fluid or suspicious lymphadenopathy. No concerning osseous lesions. There is diffuse osseous mineralization and degenerative changes of the spine and bony pelvis. IMPRESSION: No evidence for metastatic disease within the abdomen or pelvis. DOSE REDUCTION: This exam was performed according to our departmental dose-optimization program which includes automated exposure control, adjustment of the mA and/or kV according to patient size and/or use of iterative reconstruction technique. Electronically signed by: [place] Engel MD 08/19/2023 04:32 PM EDT RP Workstation: [id number]    ASSESSMENT AND PLAN: This is a very pleasant 68 years old white female recently diagnosed with a stage IV (T1b, N3, M1b) non-small cell lung cancer, adenocarcinoma presented with right upper lobe pulmonary nodule in addition to  widespread metastatic adenopathy to the ipsilateral hilum as well as bilateral mediastinal and supraclavicular lymphadenopathy as well as left axillary and mesenteric lymph nodes diagnosed in August 2023. There was insufficient material for molecular testing but blood test by [employer] showed no actionable mutations. carboplatin  for AUC of 5, Alimta 500 Mg/M2 and Keytruda  200 Mg IV every 3 weeks on 12/25/2021.  Status post  30 cycles.  Starting from cycle #5 she is on maintenance treatment with Alimta and Keytruda  every 3 weeks.  Starting from cycle #6 her dose of Alimta was reduced to 400 Mg/M2 then discontinued starting cycle #23 because of intolerance and persistent anemia.  The patient has been tolerating this treatment with single agent Keytruda  fairly well. She underwent palliative radiotherapy to the right upper lobe pulmonary nodule under the care of Dr. Jeryl Moris and tolerated it fairly well. Assessment and Plan    Lung cancer Stage IV (T1b, N3, M1b) non-small cell lung cancer, adenocarcinoma presented with right upper lobe pulmonary nodule in addition to widespread metastatic adenopathy to the ipsilateral hilum, bilateral mediastinum, bilateral neck, left axilla and left mesentery diagnosed in August 2023. Undergoing treatment with Keytruda  every three weeks. Completed 30 cycles, here for evaluation before cycle 31. Chest x-ray showed opacity and density, unreliable due to lung cancer. CT scan planned in 10-12 days to assess cancer status. - Continue Keytruda  treatment every three weeks. - Order CT scan in 10-12 days to evaluate lung cancer status.  Cough Reports heavy dry cough lasting 2-3 minutes, no mucus production, but post-nasal drainage and yellow mucus from one nostril. Cough persisted over ten days. Using amoxicillin  and cough syrup, tried Mucinex DM, salt water , tea, and honey. Cough may relate to post-nasal drainage or allergies. - Continue current cough management with Mucinex and home remedies. - Consider using Mucinex DM for cough relief.   The patient was advised to call immediately if she has any other concerning symptoms in the interval. The patient voices understanding of current disease status and treatment options and is in agreement with the current care plan.  All questions were answered. The patient knows to call the clinic with any problems, questions or concerns. We can certainly see the  patient much sooner if necessary. The total time spent in the appointment was 30 minutes including review of chart and various tests results, discussions about plan of care and coordination of care plan .  Disclaimer: This note was dictated with voice recognition software. Similar sounding words can inadvertently be transcribed and may not be corrected upon review.

## 2023-09-17 LAB — FERRITIN: Ferritin: 322 ng/mL — ABNORMAL HIGH (ref 11–307)

## 2023-09-24 ENCOUNTER — Other Ambulatory Visit: Payer: Self-pay | Admitting: Internal Medicine

## 2023-09-24 ENCOUNTER — Telehealth: Payer: Self-pay | Admitting: Medical Oncology

## 2023-09-24 DIAGNOSIS — C3490 Malignant neoplasm of unspecified part of unspecified bronchus or lung: Secondary | ICD-10-CM

## 2023-09-24 NOTE — Telephone Encounter (Signed)
 She does not have an appt for CT before 06/19. There is no order. Mohamed notified.

## 2023-09-29 ENCOUNTER — Ambulatory Visit (HOSPITAL_COMMUNITY)
Admission: RE | Admit: 2023-09-29 | Discharge: 2023-09-29 | Disposition: A | Discharge status: Home / Self Care | Source: Ambulatory Visit | Attending: Internal Medicine | Admitting: Internal Medicine

## 2023-09-29 ENCOUNTER — Other Ambulatory Visit: Payer: Self-pay | Admitting: Internal Medicine

## 2023-09-29 DIAGNOSIS — C3490 Malignant neoplasm of unspecified part of unspecified bronchus or lung: Secondary | ICD-10-CM

## 2023-09-29 MED ORDER — HEPARIN SOD (PORK) LOCK FLUSH 100 UNIT/ML IV SOLN
INTRAVENOUS | Status: AC
  Filled 2023-09-29: qty 5

## 2023-09-29 MED ORDER — IOHEXOL 300 MG/ML  SOLN
100.0000 mL | Freq: Once | INTRAMUSCULAR | Status: AC | PRN
  Administered 2023-09-29: 100 mL via INTRAVENOUS

## 2023-09-29 MED ORDER — HEPARIN SOD (PORK) LOCK FLUSH 100 UNIT/ML IV SOLN
500.0000 [IU] | Freq: Once | INTRAVENOUS | Status: AC
  Administered 2023-09-29: 500 [IU] via INTRAVENOUS
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
## 2023-10-07 ENCOUNTER — Ambulatory Visit (INDEPENDENT_AMBULATORY_CARE_PROVIDER_SITE_OTHER): Admitting: Ophthalmology

## 2023-10-07 ENCOUNTER — Encounter (INDEPENDENT_AMBULATORY_CARE_PROVIDER_SITE_OTHER): Payer: Self-pay | Admitting: Ophthalmology

## 2023-10-07 DIAGNOSIS — H353211 Exudative age-related macular degeneration, right eye, with active choroidal neovascularization: Secondary | ICD-10-CM | POA: Diagnosis not present

## 2023-10-07 DIAGNOSIS — H04123 Dry eye syndrome of bilateral lacrimal glands: Secondary | ICD-10-CM | POA: Diagnosis not present

## 2023-10-07 DIAGNOSIS — C3491 Malignant neoplasm of unspecified part of right bronchus or lung: Secondary | ICD-10-CM

## 2023-10-07 DIAGNOSIS — H35711 Central serous chorioretinopathy, right eye: Secondary | ICD-10-CM | POA: Diagnosis not present

## 2023-10-07 DIAGNOSIS — Z961 Presence of intraocular lens: Secondary | ICD-10-CM

## 2023-10-07 MED ORDER — BEVACIZUMAB CHEMO INJECTION 1.25MG/0.05ML SYRINGE FOR KALEIDOSCOPE
1.2500 mg | INTRAVITREAL | Status: AC | PRN
  Administered 2023-10-07: 1.25 mg via INTRAVITREAL

## 2023-10-08 ENCOUNTER — Inpatient Hospital Stay (HOSPITAL_BASED_OUTPATIENT_CLINIC_OR_DEPARTMENT_OTHER): Admitting: Physician Assistant

## 2023-10-08 ENCOUNTER — Inpatient Hospital Stay

## 2023-10-08 ENCOUNTER — Inpatient Hospital Stay: Attending: Internal Medicine

## 2023-10-08 ENCOUNTER — Inpatient Hospital Stay: Admitting: Nurse Practitioner

## 2023-10-08 ENCOUNTER — Encounter: Payer: Self-pay | Admitting: Nurse Practitioner

## 2023-10-08 VITALS — BP 111/61 | HR 87 | Temp 98.4°F | Resp 17 | Ht 61.0 in | Wt 156.2 lb

## 2023-10-08 DIAGNOSIS — D649 Anemia, unspecified: Secondary | ICD-10-CM | POA: Insufficient documentation

## 2023-10-08 DIAGNOSIS — Z5112 Encounter for antineoplastic immunotherapy: Secondary | ICD-10-CM

## 2023-10-08 DIAGNOSIS — C3491 Malignant neoplasm of unspecified part of right bronchus or lung: Secondary | ICD-10-CM

## 2023-10-08 DIAGNOSIS — Z79899 Other long term (current) drug therapy: Secondary | ICD-10-CM | POA: Insufficient documentation

## 2023-10-08 DIAGNOSIS — C778 Secondary and unspecified malignant neoplasm of lymph nodes of multiple regions: Secondary | ICD-10-CM | POA: Diagnosis present

## 2023-10-08 DIAGNOSIS — Z515 Encounter for palliative care: Secondary | ICD-10-CM

## 2023-10-08 DIAGNOSIS — D701 Agranulocytosis secondary to cancer chemotherapy: Secondary | ICD-10-CM

## 2023-10-08 DIAGNOSIS — C3411 Malignant neoplasm of upper lobe, right bronchus or lung: Secondary | ICD-10-CM | POA: Insufficient documentation

## 2023-10-08 DIAGNOSIS — T451X5A Adverse effect of antineoplastic and immunosuppressive drugs, initial encounter: Secondary | ICD-10-CM

## 2023-10-08 LAB — CMP (CANCER CENTER ONLY)
ALT: 13 U/L (ref 0–44)
AST: 16 U/L (ref 15–41)
Albumin: 3.8 g/dL (ref 3.5–5.0)
Alkaline Phosphatase: 85 U/L (ref 38–126)
Anion gap: 6 (ref 5–15)
BUN: 23 mg/dL (ref 8–23)
CO2: 30 mmol/L (ref 22–32)
Calcium: 9.6 mg/dL (ref 8.9–10.3)
Chloride: 107 mmol/L (ref 98–111)
Creatinine: 1.08 mg/dL — ABNORMAL HIGH (ref 0.44–1.00)
GFR, Estimated: 57 mL/min — ABNORMAL LOW (ref >60)
Glucose, Bld: 92 mg/dL (ref 70–99)
Potassium: 4 mmol/L (ref 3.5–5.1)
Sodium: 143 mmol/L (ref 135–145)
Total Bilirubin: 0.2 mg/dL (ref 0.0–1.2)
Total Protein: 6.6 g/dL (ref 6.5–8.1)

## 2023-10-08 LAB — CBC WITH DIFFERENTIAL (CANCER CENTER ONLY)
Abs Immature Granulocytes: 0.03 10*3/uL (ref 0.00–0.07)
Basophils Absolute: 0 10*3/uL (ref 0.0–0.1)
Basophils Relative: 1 %
Eosinophils Absolute: 0.3 10*3/uL (ref 0.0–0.5)
Eosinophils Relative: 4 %
HCT: 37.2 % (ref 36.0–46.0)
Hemoglobin: 12.5 g/dL (ref 12.0–15.0)
Immature Granulocytes: 1 %
Lymphocytes Relative: 31 %
Lymphs Abs: 2 10*3/uL (ref 0.7–4.0)
MCH: 32.9 pg (ref 26.0–34.0)
MCHC: 33.6 g/dL (ref 30.0–36.0)
MCV: 97.9 fL (ref 80.0–100.0)
Monocytes Absolute: 0.6 10*3/uL (ref 0.1–1.0)
Monocytes Relative: 10 %
Neutro Abs: 3.4 10*3/uL (ref 1.7–7.7)
Neutrophils Relative %: 53 %
Platelet Count: 209 10*3/uL (ref 150–400)
RBC: 3.8 MIL/uL — ABNORMAL LOW (ref 3.87–5.11)
RDW: 14.5 % (ref 11.5–15.5)
WBC Count: 6.4 10*3/uL (ref 4.0–10.5)
nRBC: 0 % (ref 0.0–0.2)

## 2023-10-08 MED ORDER — SODIUM CHLORIDE 0.9 % IV SOLN
200.0000 mg | Freq: Once | INTRAVENOUS | Status: AC
  Administered 2023-10-08: 200 mg via INTRAVENOUS
  Filled 2023-10-08: qty 200

## 2023-10-08 MED ORDER — SODIUM CHLORIDE 0.9 % IV SOLN: Freq: Once | INTRAVENOUS | Status: AC

## 2023-10-08 MED ORDER — HEPARIN SOD (PORK) LOCK FLUSH 100 UNIT/ML IV SOLN
500.0000 [IU] | Freq: Once | INTRAVENOUS | Status: AC | PRN
  Administered 2023-10-08: 500 [IU]

## 2023-10-08 MED ORDER — SODIUM CHLORIDE 0.9% FLUSH
10.0000 mL | INTRAVENOUS | Status: DC | PRN
  Administered 2023-10-08: 10 mL

## 2023-10-08 MED ORDER — SODIUM CHLORIDE 0.9% FLUSH
10.0000 mL | Freq: Once | INTRAVENOUS | Status: AC
  Administered 2023-10-08: 10 mL

## 2023-10-08 NOTE — Patient Instructions (Signed)
 [facility] - A DEPT OF [facility]  Discharge Instructions: Thank you for choosing [facility] to provide your oncology and hematology care.   If you have a lab appointment with the Cancer Center, please go directly to the Cancer Center and check in at the registration area.   Wear comfortable clothing and clothing appropriate for easy access to any Portacath or PICC line.   We strive to give you quality time with your provider. You may need to reschedule your appointment if you arrive late (15 or more minutes).  Arriving late affects you and other patients whose appointments are after yours.  Also, if you miss three or more appointments without notifying the office, you may be dismissed from the clinic at the provider's discretion.      For prescription refill requests, have your pharmacy contact our office and allow 72 hours for refills to be completed.    Today you received the following chemotherapy and/or immunotherapy agents Yvette Curry      To help prevent nausea and vomiting after your treatment, we encourage you to take your nausea medication as directed.  BELOW ARE SYMPTOMS THAT SHOULD BE REPORTED IMMEDIATELY: *FEVER GREATER THAN 100.4 F (38 C) OR HIGHER *CHILLS OR SWEATING *NAUSEA AND VOMITING THAT IS NOT CONTROLLED WITH YOUR NAUSEA MEDICATION *UNUSUAL SHORTNESS OF BREATH *UNUSUAL BRUISING OR BLEEDING *URINARY PROBLEMS (pain or burning when urinating, or frequent urination) *BOWEL PROBLEMS (unusual diarrhea, constipation, pain near the anus) TENDERNESS IN MOUTH AND THROAT WITH OR WITHOUT PRESENCE OF ULCERS (sore throat, sores in mouth, or a toothache) UNUSUAL RASH, SWELLING OR PAIN  UNUSUAL VAGINAL DISCHARGE OR ITCHING   Items with * indicate a potential emergency and should be followed up as soon as possible or go to the Emergency Department if any problems should occur.  Please show the CHEMOTHERAPY ALERT CARD or IMMUNOTHERAPY  ALERT CARD at check-in to the Emergency Department and triage nurse.  Should you have questions after your visit or need to cancel or reschedule your appointment, please contact [facility] - A DEPT OF [facility]  Dept: [phone]  and follow the prompts.  Office hours are 8:00 a.m. to 4:30 p.m. Monday - Friday. Please note that voicemails left after 4:00 p.m. may not be returned until the following business day.  We are closed weekends and major holidays. You have access to a nurse at all times for urgent questions. Please call the main number to the clinic Dept: [phone] and follow the prompts.   For any non-urgent questions, you may also contact your provider using MyChart. We now offer e-Visits for anyone 85 and older to request care online for non-urgent symptoms. For details visit [email or web address].   Also download the MyChart app! Go to the app store, search "MyChart", open the app, select , and log in with your MyChart username and password.

## 2023-10-08 NOTE — Progress Notes (Signed)
 Palliative Medicine [facility]  [phone] [phone]   Name: Yvette Curry Date: 10/08/2023 MRN: [id number]  DOB: 1955/12/01  Patient Care Team: Myrna Ast, DO as PCP - General (Family Medicine) Mallipeddi, Kennyth Pean, MD as PCP - Cardiology (Cardiology) Samson Croak, MD as Consulting Physician (Urology) Pickenpack-Cousar, Giles Labrum, NP as Nurse Practitioner (Hospice and Palliative Medicine) Evander Hills, PA-C as Physician Assistant (Gastroenterology) Pickenpack-Cousar, Giles Labrum, NP as Nurse Practitioner (Hospice and Palliative Medicine) Heilingoetter, Leita Purdue, PA-C as Physician Assistant (Physician Assistant) Ronelle Coffee, MD as Consulting Physician (Ophthalmology) Umberto Ganong, Bearl Limes, MD as Consulting Physician (Gastroenterology) Margherita Shell, MD as Consulting Physician (Vascular Surgery) Marlene Simas, MD as Consulting Physician (Oncology)    INTERVAL HISTORY: Yvette Curry is a 68 y.o. 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 Palliative ask to see for symptom management and goals of care.   SOCIAL HISTORY:     reports that she has been smoking cigarettes. She has a 18.5 pack-year smoking history. She has been exposed to tobacco smoke. She has never used smokeless tobacco. She reports that she does not drink alcohol  and does not use drugs.  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  MEDICATIONS:  Current Outpatient Medications  Medication Sig Dispense Refill   amoxicillin -clavulanate (AUGMENTIN ) 875-125 MG tablet Take 1 tablet by mouth 2 (two) times daily. 14 tablet 0   ascorbic acid  (VITAMIN C ) 500 MG tablet Take 1,000 mg by mouth daily.     Budeson-Glycopyrrol-Formoterol  (BREZTRI  AEROSPHERE) 160-9-4.8 MCG/ACT AERO Inhale 2 puffs into the lungs in the morning and at bedtime. 10.7 g 3   carboxymethylcellulose 1 % ophthalmic solution Apply 1-2 drops to eye as needed (dry eye).     Cholecalciferol (VITAMIN D ) 50 MCG (2000 UT) CAPS Take by mouth.     ferrous sulfate  324 MG TBEC Take 1 tablet (324 mg total) by mouth daily with breakfast. 30 tablet 4   Homeopathic Products (CHESTAL HONEY COUGH PO) Take by mouth.     loratadine (CLARITIN) 10 MG tablet Take 10 mg by mouth daily.     midodrine  (PROAMATINE ) 5 MG tablet Take 1 tablet (5 mg total) by mouth 3 (three) times daily as needed (If your Systolic Blood Pressure is less than 90). 90 tablet 1   pantoprazole  (PROTONIX ) 40 MG tablet TAKE 1 TABLET BY MOUTH EVERY DAY BEFORE BREAKFAST 90 tablet 1   Probiotic Product (PROBIOTIC PO) Take 1 capsule by mouth daily.     prochlorperazine  (COMPAZINE ) 10 MG tablet Take 10 mg by mouth as needed for nausea or vomiting. Every 3 weeks before chemo     promethazine -dextromethorphan (PROMETHAZINE -DM) 6.25-15 MG/5ML syrup Take 5 mLs by mouth 4 (four)  times daily as needed for cough. 118 mL 0   zinc gluconate 50 MG tablet Take 100 mg by mouth daily.     No current facility-administered medications for this visit.   Facility-Administered Medications Ordered in Other Visits  Medication Dose Route Frequency Provider Last Rate Last Admin   sodium chloride  flush (NS) 0.9 % injection 10 mL  10 mL Intracatheter PRN Marlene Simas, MD   10 mL at 10/08/23 1334    VITAL SIGNS: There were no vitals taken for this visit. There were no vitals filed for this visit.  Estimated body mass index is 29.51 kg/m as calculated from the following:   Height as of an earlier encounter on 10/08/23: 5' 1 (1.549 m).   Weight as of an earlier encounter on 10/08/23: 156 lb 3.2 oz (70.9 kg).   PERFORMANCE STATUS (ECOG) : 1 - Symptomatic but completely ambulatory   Physical Exam General: NAD Cardiovascular: regular rate and rhythm Pulmonary: normal breathing pattern, cough Extremities: no edema, no joint deformities Skin: no rashes Neurological: AAO x3  IMPRESSION: Discussed the use of AI scribe software for clinical note transcription with the patient, who gave verbal consent to proceed.  History of Present Illness Yvette Curry is a 68 year old female with non-small cell lung cancer who was seen during infusion. No acute distress noted. She is doing well overall. She is accompanied by her husband [name]. Appetite is good.   No other new symptoms noted. She is doing well overall. Denies concerns for nausea, vomiting, constipation, or diarrhea. We will continue to support and follow as needed. All questions answered.   I discussed the importance of continued conversation with family and their medical providers regarding overall plan of care and treatment options, ensuring decisions are within the context of the patients values and GOCs.  Assessment & Plan  Follow-up Doing well overall with no new concerns. - Schedule follow-up appointment in 6-8 weeks to reassess condition and needs.  Patient expressed understanding and was in agreement with this plan. She also understands that She can call the clinic at any time with any questions, concerns, or complaints.   Any controlled substances utilized were prescribed in the context of palliative care. PDMP has been reviewed.   Visit consisted of counseling and education  dealing with the complex and emotionally intense issues of symptom management and palliative care in the setting of serious and potentially life-threatening illness.  Dellia Ferguson, AGPCNP-BC  Palliative Medicine Team/[facility]

## 2023-10-09 ENCOUNTER — Ambulatory Visit: Admitting: Emergency Medicine

## 2023-10-09 ENCOUNTER — Encounter: Payer: Self-pay | Admitting: Emergency Medicine

## 2023-10-09 VITALS — BP 138/88 | HR 93 | Ht 61.0 in | Wt 155.0 lb

## 2023-10-09 DIAGNOSIS — J439 Emphysema, unspecified: Secondary | ICD-10-CM | POA: Diagnosis not present

## 2023-10-09 DIAGNOSIS — Z720 Tobacco use: Secondary | ICD-10-CM

## 2023-10-09 DIAGNOSIS — F17210 Nicotine dependence, cigarettes, uncomplicated: Secondary | ICD-10-CM

## 2023-10-09 DIAGNOSIS — C3491 Malignant neoplasm of unspecified part of right bronchus or lung: Secondary | ICD-10-CM

## 2023-10-09 NOTE — Progress Notes (Signed)
 Subjective:    Patient ID: Yvette Curry, female    DOB: Sep 05, 1955, 68 y.o.   MRN: [id number]  HPI 68 year old woman with a history of active tobacco use (>30 pk-yrs), COPD, stage IV adenocarcinoma of the right lung (to bilateral mediastinum, left axilla, left mesentery, effusions) previously followed by Dr. Thelda Finney in our office and currently followed by oncology at [facility].  She is currently on Keytruda  (had been on Alimta but discontinued due to recurrent cellulitis).  Her course has been significant for bilateral pleural effusions that improved on her treatment regimen, left axillary lymphadenopathy.  We have been managing her on Breztri , but she is not actually using it. She stopped it over a month ago. She hasn't seemed to miss it, wasn't taking reliably.  She has intermittent cough, had a spell of coughing and was treated for a possible bronchitis after a URI. Treated w promethazine  DM, treated w Augmentin . She is better, but still has the intermittent cough. Allergies are active, off loratadine right now. Smoking about 10 cig a day.   CT chest abdomen and pelvis 09/29/2023 reviewed by me showed unchanged left axillary lymphadenopathy 1.7 x 1.5 cm, moderate centrilobular emphysema with bilateral bronchial wall thickening, unchanged right upper lobe spiculated nodule 1.2 x 0.9 cm, resolved right pleural effusion.  No evidence of any metastatic disease or lymphadenopathy in the abdomen or pelvis.   Review of Systems As per HPI  Past Medical History:  Diagnosis Date   Anxiety    no current tx   Depression    no meds at present   Dyspnea    Family history of adverse reaction to anesthesia    pt states mom had allergic reaction to some unknown anesthesia   GERD (gastroesophageal reflux disease)    no tx since weight loss   Hypercholesteremia    Osteoarthritis    stage IV lung ca 11/2021     Family History  Problem Relation Age of Onset   Heart disease Mother    Kidney cancer Mother     Emphysema Father    Heart disease Father    Colon cancer Neg Hx      Social History   Socioeconomic History   Marital status: Married    Spouse name: [name]   Number of children: Y   Years of education: Not on file   Highest education level: 12th grade  Occupational History   Occupation: Personnel officer: UNEMPLOYED  Tobacco Use   Smoking status: Every Day    Current packs/day: 0.50    Average packs/day: 0.5 packs/day for 37.0 years (18.5 ttl pk-yrs)    Types: Cigarettes    Passive exposure: Current   Smokeless tobacco: Never   Tobacco comments:    Smokes half a pack of cigarettes a day. 12/18/2022 Tay  Vaping Use   Vaping status: Former  Substance and Sexual Activity   Alcohol  use: No   Drug use: No   Sexual activity: Yes    Comment: hysterectomy  Other Topics Concern   Not on file  Social History Narrative   Not on file   Social Drivers of Health   Financial Resource Strain: Low Risk  (06/19/2023)   Overall Financial Resource Strain (CARDIA)    Difficulty of Paying Living Expenses: Not hard at all  Food Insecurity: No Food Insecurity (06/19/2023)   Hunger Vital Sign    Worried About Running Out of Food in the Last Year: Never true    Ran  Out of Food in the Last Year: Never true  Transportation Needs: No Transportation Needs (06/19/2023)   PRAPARE - Administrator, Civil Service (Medical): No    Lack of Transportation (Non-Medical): No  Physical Activity: Inactive (06/19/2023)   Exercise Vital Sign    Days of Exercise per Week: 0 days    Minutes of Exercise per Session: 0 min  Stress: No Stress Concern Present (06/19/2023)   [employer] of Occupational Health - Occupational Stress Questionnaire    Feeling of Stress : Not at all  Social Connections: Moderately Isolated (06/19/2023)   Social Connection and Isolation Panel    Frequency of Communication with Friends and Family: More than three times a week    Frequency of Social Gatherings with  Friends and Family: More than three times a week    Attends Religious Services: Never    Database administrator or Organizations: No    Attends Banker Meetings: Never    Marital Status: Married  Catering manager Violence: Not At Risk (06/19/2023)   Humiliation, Afraid, Rape, and Kick questionnaire    Fear of Current or Ex-Partner: No    Emotionally Abused: No    Physically Abused: No    Sexually Abused: No     Allergies  Allergen Reactions   Lidocaine  Shortness Of Breath and Anxiety    Patient felt like she couldn't breathe, panicky Allergic to all  caines   Mepivacaine Swelling    angioedema   Demerol  Nausea And Vomiting   Prednisone Hives and Nausea And Vomiting    abd pain and vomiting, Hives    Sulfa Antibiotics Hives    Hives, swelling and itching     Outpatient Medications Prior to Visit  Medication Sig Dispense Refill   amoxicillin -clavulanate (AUGMENTIN ) 875-125 MG tablet Take 1 tablet by mouth 2 (two) times daily. 14 tablet 0   ascorbic acid  (VITAMIN C ) 500 MG tablet Take 1,000 mg by mouth daily.     Budeson-Glycopyrrol-Formoterol  (BREZTRI  AEROSPHERE) 160-9-4.8 MCG/ACT AERO Inhale 2 puffs into the lungs in the morning and at bedtime. 10.7 g 3   carboxymethylcellulose 1 % ophthalmic solution Apply 1-2 drops to eye as needed (dry eye).     Cholecalciferol (VITAMIN D ) 50 MCG (2000 UT) CAPS Take by mouth.     ferrous sulfate  324 MG TBEC Take 1 tablet (324 mg total) by mouth daily with breakfast. 30 tablet 4   Homeopathic Products (CHESTAL HONEY COUGH PO) Take by mouth.     loratadine (CLARITIN) 10 MG tablet Take 10 mg by mouth daily.     midodrine  (PROAMATINE ) 5 MG tablet Take 1 tablet (5 mg total) by mouth 3 (three) times daily as needed (If your Systolic Blood Pressure is less than 90). 90 tablet 1   pantoprazole  (PROTONIX ) 40 MG tablet TAKE 1 TABLET BY MOUTH EVERY DAY BEFORE BREAKFAST 90 tablet 1   Probiotic Product (PROBIOTIC PO) Take 1 capsule by  mouth daily.     prochlorperazine  (COMPAZINE ) 10 MG tablet Take 10 mg by mouth as needed for nausea or vomiting. Every 3 weeks before chemo     promethazine -dextromethorphan (PROMETHAZINE -DM) 6.25-15 MG/5ML syrup Take 5 mLs by mouth 4 (four) times daily as needed for cough. 118 mL 0   zinc gluconate 50 MG tablet Take 100 mg by mouth daily.     No facility-administered medications prior to visit.        Objective:   Physical Exam  Vitals:  10/09/23 1054  BP: 138/88  Pulse: 93  SpO2: 93%  Weight: 155 lb (70.3 kg)  Height: 5' 1 (1.549 m)    Gen: Pleasant, well-nourished, in no distress,  normal affect  ENT: No lesions,  mouth clear,  oropharynx clear, no postnasal drip  Neck: No JVD, no stridor  Lungs: No use of accessory muscles, few scattered rhonchi but mostly clear.  No wheezing.  Cardiovascular: RRR, heart sounds normal, no murmur or gallops, no peripheral edema  Musculoskeletal: No deformities, no cyanosis or clubbing  Neuro: alert, awake, non focal  Skin: Warm, no lesions or rash      Assessment & Plan:   COPD (chronic obstructive pulmonary disease) (HCC) She was only using the Breztri  intermittently.  She does still have intermittent cough, minimal shortness of breath.  I think stopping the Breztri  makes sense.  She has albuterol  that she can use as needed.  If symptoms evolve and we need to get back on maintenance therapy then we can do so.  Adenocarcinoma of right lung, stage 4 (HCC) Followed closely by Dr. Marguerita Shih.  She is finishing up her designated regimen of Keytruda  and then will have surveillance imaging.  Tobacco abuse We talked in detail about cessation today.  I did explain that there is benefit cutting down even if she cannot stop completely.  We talked about strategies to cut down.  Time spent 41 minutes  Racheal Buddle, MD, PhD 10/09/2023, 11:28 AM [facility] [phone] or if no answer before 7:00PM call  [phone] For any issues after 7:00PM please call eLink [phone]

## 2023-10-09 NOTE — Assessment & Plan Note (Signed)
 Followed closely by Dr. Marguerita Shih.  She is finishing up her designated regimen of Keytruda  and then will have surveillance imaging.

## 2023-10-09 NOTE — Assessment & Plan Note (Signed)
 She was only using the Breztri  intermittently.  She does still have intermittent cough, minimal shortness of breath.  I think stopping the Breztri  makes sense.  She has albuterol  that she can use as needed.  If symptoms evolve and we need to get back on maintenance therapy then we can do so.

## 2023-10-09 NOTE — Assessment & Plan Note (Signed)
 We talked in detail about cessation today.  I did explain that there is benefit cutting down even if she cannot stop completely.  We talked about strategies to cut down.

## 2023-10-09 NOTE — Patient Instructions (Signed)
 Will stop Breztri  at this time.  We can consider getting back on a maintenance inhaler medication at some point in the future depending on how your symptoms evolve. Keep albuterol  (Ventolin ) available to use 2 puffs if needed for shortness of breath, chest tightness, cough and mucus clearance Continue your Keytruda  regimen as recommended by Dr. Marguerita Shih and follow-up your chest imaging as per his plans. We talked today about strategies to cut down on your cigarettes.  I am glad that you are interested in doing so.  Try to ration your cigarettes, change your daily habits that are associated with smoking.  If you are able to cut down we may be able to discuss possibly setting a quit date at some point in the future.  Even cutting down some is going to be medically beneficial. Follow with Dr Baldwin Levee in 6 months or sooner if you have any problems

## 2023-10-11 ENCOUNTER — Encounter (INDEPENDENT_AMBULATORY_CARE_PROVIDER_SITE_OTHER): Payer: Self-pay | Admitting: Ophthalmology

## 2023-10-12 ENCOUNTER — Ambulatory Visit: Payer: Medicare Other | Attending: Internal Medicine | Admitting: Internal Medicine

## 2023-10-12 ENCOUNTER — Encounter: Payer: Self-pay | Admitting: Internal Medicine

## 2023-10-12 VITALS — BP 118/82 | HR 94 | Ht 61.0 in | Wt 156.8 lb

## 2023-10-12 DIAGNOSIS — I5022 Chronic systolic (congestive) heart failure: Secondary | ICD-10-CM | POA: Diagnosis not present

## 2023-10-12 DIAGNOSIS — I3139 Other pericardial effusion (noninflammatory): Secondary | ICD-10-CM
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
## 2023-10-12 NOTE — Patient Instructions (Addendum)
 Medication Instructions:  Your physician recommends that you continue on your current medications as directed. Please refer to the Current Medication list given to you today.   Labwork: None  Testing/Procedures: Your physician has requested that you have an echocardiogram in a year. Echocardiography is a painless test that uses sound waves to create images of your heart. It provides your doctor with information about the size and shape of your heart and how well your heart's chambers and valves are working. This procedure takes approximately one hour. There are no restrictions for this procedure. Please do NOT wear cologne, perfume, aftershave, or lotions (deodorant is allowed). Please arrive 15 minutes prior to your appointment time.  Please note: We ask at that you not bring children with you during ultrasound (echo/ vascular) testing. Due to room size and safety concerns, children are not allowed in the ultrasound rooms during exams. Our front office staff cannot provide observation of children in our lobby area while testing is being conducted. An adult accompanying a patient to their appointment will only be allowed in the ultrasound room at the discretion of the ultrasound technician under special circumstances. We apologize for any inconvenience.   Follow-Up: Your physician recommends that you schedule a follow-up appointment in: 1 year. You will receive a reminder call in about 8 months reminding you to schedule your appointment. If you don't receive this call, please contact our office.   Any Other Special Instructions Will Be Listed Below (If Applicable). Thank you for choosing [facility]!     If you need a refill on your cardiac medications before your next appointment, please call your pharmacy.
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
## 2023-10-26 ENCOUNTER — Ambulatory Visit: Admitting: Physician Assistant

## 2023-10-26 ENCOUNTER — Inpatient Hospital Stay: Attending: Internal Medicine

## 2023-10-26 ENCOUNTER — Inpatient Hospital Stay (HOSPITAL_BASED_OUTPATIENT_CLINIC_OR_DEPARTMENT_OTHER): Admitting: Physician Assistant

## 2023-10-26 VITALS — BP 153/92 | HR 82 | Temp 97.6°F | Resp 20 | Wt 157.2 lb

## 2023-10-26 DIAGNOSIS — C3491 Malignant neoplasm of unspecified part of right bronchus or lung: Secondary | ICD-10-CM

## 2023-10-26 DIAGNOSIS — D701 Agranulocytosis secondary to cancer chemotherapy: Secondary | ICD-10-CM

## 2023-10-26 DIAGNOSIS — D649 Anemia, unspecified: Secondary | ICD-10-CM | POA: Insufficient documentation

## 2023-10-26 DIAGNOSIS — Z79899 Other long term (current) drug therapy: Secondary | ICD-10-CM | POA: Diagnosis not present

## 2023-10-26 DIAGNOSIS — C3411 Malignant neoplasm of upper lobe, right bronchus or lung: Secondary | ICD-10-CM | POA: Insufficient documentation

## 2023-10-26 DIAGNOSIS — C778 Secondary and unspecified malignant neoplasm of lymph nodes of multiple regions: Secondary | ICD-10-CM | POA: Insufficient documentation

## 2023-10-26 DIAGNOSIS — Z5112 Encounter for antineoplastic immunotherapy: Secondary | ICD-10-CM | POA: Diagnosis not present

## 2023-10-26 LAB — CMP (CANCER CENTER ONLY)
ALT: 15 U/L (ref 0–44)
AST: 17 U/L (ref 15–41)
Albumin: 4 g/dL (ref 3.5–5.0)
Alkaline Phosphatase: 59 U/L (ref 38–126)
Anion gap: 6 (ref 5–15)
BUN: 16 mg/dL (ref 8–23)
CO2: 29 mmol/L (ref 22–32)
Calcium: 10.1 mg/dL (ref 8.9–10.3)
Chloride: 107 mmol/L (ref 98–111)
Creatinine: 0.98 mg/dL (ref 0.44–1.00)
GFR, Estimated: >60 mL/min (ref >60)
Glucose, Bld: 85 mg/dL (ref 70–99)
Potassium: 4.3 mmol/L (ref 3.5–5.1)
Sodium: 142 mmol/L (ref 135–145)
Total Bilirubin: 0.4 mg/dL (ref 0.0–1.2)
Total Protein: 6.9 g/dL (ref 6.5–8.1)

## 2023-10-26 LAB — CBC WITH DIFFERENTIAL (CANCER CENTER ONLY)
Abs Immature Granulocytes: 0.02 K/uL (ref 0.00–0.07)
Basophils Absolute: 0.1 K/uL (ref 0.0–0.1)
Basophils Relative: 1 %
Eosinophils Absolute: 0.3 K/uL (ref 0.0–0.5)
Eosinophils Relative: 4 %
HCT: 39.3 % (ref 36.0–46.0)
Hemoglobin: 13.2 g/dL (ref 12.0–15.0)
Immature Granulocytes: 0 %
Lymphocytes Relative: 33 %
Lymphs Abs: 2.1 K/uL (ref 0.7–4.0)
MCH: 32.7 pg (ref 26.0–34.0)
MCHC: 33.6 g/dL (ref 30.0–36.0)
MCV: 97.3 fL (ref 80.0–100.0)
Monocytes Absolute: 0.5 K/uL (ref 0.1–1.0)
Monocytes Relative: 8 %
Neutro Abs: 3.4 K/uL (ref 1.7–7.7)
Neutrophils Relative %: 54 %
Platelet Count: 192 K/uL (ref 150–400)
RBC: 4.04 MIL/uL (ref 3.87–5.11)
RDW: 14.3 % (ref 11.5–15.5)
WBC Count: 6.3 K/uL (ref 4.0–10.5)
nRBC: 0 % (ref 0.0–0.2)

## 2023-10-26 MED ORDER — SODIUM CHLORIDE 0.9% FLUSH
10.0000 mL | Freq: Once | INTRAVENOUS | Status: AC
  Administered 2023-10-26: 10 mL

## 2023-10-26 MED ORDER — HEPARIN SOD (PORK) LOCK FLUSH 100 UNIT/ML IV SOLN
500.0000 [IU] | Freq: Once | INTRAVENOUS | Status: AC
Start: 2023-10-26 — End: 2023-10-26
  Administered 2023-10-26: 500 [IU]

## 2023-10-27 LAB — T4: T4, Total: 9 ug/dL (ref 4.5–12.0)

## 2023-10-27 LAB — TSH: TSH: 1.08 u[IU]/mL (ref 0.350–4.500)
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
## 2023-10-28 ENCOUNTER — Inpatient Hospital Stay

## 2023-10-28 ENCOUNTER — Inpatient Hospital Stay: Admitting: Nurse Practitioner

## 2023-10-28 ENCOUNTER — Inpatient Hospital Stay: Admitting: Internal Medicine

## 2023-10-28 ENCOUNTER — Encounter: Payer: Self-pay | Admitting: Nurse Practitioner

## 2023-10-28 VITALS — BP 153/81 | HR 78 | Temp 98.4°F | Resp 17 | Wt 155.8 lb

## 2023-10-28 DIAGNOSIS — C3491 Malignant neoplasm of unspecified part of right bronchus or lung: Secondary | ICD-10-CM | POA: Diagnosis not present

## 2023-10-28 DIAGNOSIS — Z515 Encounter for palliative care: Secondary | ICD-10-CM | POA: Diagnosis not present

## 2023-10-28 DIAGNOSIS — Z5112 Encounter for antineoplastic immunotherapy: Secondary | ICD-10-CM | POA: Diagnosis not present

## 2023-10-28 DIAGNOSIS — R530 Neoplastic (malignant) related fatigue: Secondary | ICD-10-CM

## 2023-10-28 MED ORDER — SODIUM CHLORIDE 0.9 % IV SOLN: Freq: Once | INTRAVENOUS | Status: AC

## 2023-10-28 MED ORDER — SODIUM CHLORIDE 0.9 % IV SOLN
200.0000 mg | Freq: Once | INTRAVENOUS | Status: AC
  Administered 2023-10-28: 200 mg via INTRAVENOUS
  Filled 2023-10-28: qty 200

## 2023-10-28 NOTE — Patient Instructions (Signed)
 [facility] - A DEPT OF [facility]  Discharge Instructions: Thank you for choosing [facility] to provide your oncology and hematology care.   If you have a lab appointment with the Cancer Center, please go directly to the Cancer Center and check in at the registration area.   Wear comfortable clothing and clothing appropriate for easy access to any Portacath or PICC line.   We strive to give you quality time with your provider. You may need to reschedule your appointment if you arrive late (15 or more minutes).  Arriving late affects you and other patients whose appointments are after yours.  Also, if you miss three or more appointments without notifying the office, you may be dismissed from the clinic at the provider's discretion.      For prescription refill requests, have your pharmacy contact our office and allow 72 hours for refills to be completed.    Today you received the following chemotherapy and/or immunotherapy agents Yvette Curry      To help prevent nausea and vomiting after your treatment, we encourage you to take your nausea medication as directed.  BELOW ARE SYMPTOMS THAT SHOULD BE REPORTED IMMEDIATELY: *FEVER GREATER THAN 100.4 F (38 C) OR HIGHER *CHILLS OR SWEATING *NAUSEA AND VOMITING THAT IS NOT CONTROLLED WITH YOUR NAUSEA MEDICATION *UNUSUAL SHORTNESS OF BREATH *UNUSUAL BRUISING OR BLEEDING *URINARY PROBLEMS (pain or burning when urinating, or frequent urination) *BOWEL PROBLEMS (unusual diarrhea, constipation, pain near the anus) TENDERNESS IN MOUTH AND THROAT WITH OR WITHOUT PRESENCE OF ULCERS (sore throat, sores in mouth, or a toothache) UNUSUAL RASH, SWELLING OR PAIN  UNUSUAL VAGINAL DISCHARGE OR ITCHING   Items with * indicate a potential emergency and should be followed up as soon as possible or go to the Emergency Department if any problems should occur.  Please show the CHEMOTHERAPY ALERT CARD or IMMUNOTHERAPY  ALERT CARD at check-in to the Emergency Department and triage nurse.  Should you have questions after your visit or need to cancel or reschedule your appointment, please contact [facility] - A DEPT OF [facility]  Dept: [phone]  and follow the prompts.  Office hours are 8:00 a.m. to 4:30 p.m. Monday - Friday. Please note that voicemails left after 4:00 p.m. may not be returned until the following business day.  We are closed weekends and major holidays. You have access to a nurse at all times for urgent questions. Please call the main number to the clinic Dept: [phone] and follow the prompts.   For any non-urgent questions, you may also contact your provider using MyChart. We now offer e-Visits for anyone 85 and older to request care online for non-urgent symptoms. For details visit [email or web address].   Also download the MyChart app! Go to the app store, search "MyChart", open the app, select , and log in with your MyChart username and password.

## 2023-10-28 NOTE — Progress Notes (Signed)
 Palliative Medicine [facility]  Telephone:([phone]) [phone] Fax:([phone]) [phone]   Name: Yvette Curry Date: 10/28/2023 MRN: [id number]  DOB: Aug 08, 1955  Patient Care Team: Cook, [name], DO as PCP - General (Family Medicine) Mallipeddi, Diannah SQUIBB, MD as PCP - Cardiology (Cardiology) Carolee Sherwood JONETTA DOUGLAS, MD as Consulting Physician (Urology) Pickenpack-Cousar, Fannie SAILOR, NP as Nurse Practitioner (Hospice and Palliative Medicine) Rudy Josette RAMAN, PA-C as Physician Assistant (Gastroenterology) Pickenpack-Cousar, Fannie SAILOR, NP as Nurse Practitioner (Hospice and Palliative Medicine) Heilingoetter, Calton CROME, PA-C as Physician Assistant (Physician Assistant) Valdemar Rogue, MD as Consulting Physician (Ophthalmology) Eartha Flavors, Toribio, MD as Consulting Physician (Gastroenterology) Serene Gaile ORN, MD as Consulting Physician (Vascular Surgery) Sherrod Sherrod, MD as Consulting Physician (Oncology)    INTERVAL HISTORY: Yvette Curry is a 68 y.o. 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 Palliative ask to see for symptom management and goals of care.   SOCIAL HISTORY:     reports that she has been smoking cigarettes. She has a 18.5 pack-year smoking history. She has been exposed to tobacco smoke. She has never used smokeless tobacco. She reports that she does not drink alcohol  and does not use drugs.  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  MEDICATIONS:  Current Outpatient Medications  Medication Sig Dispense Refill   amoxicillin -clavulanate (AUGMENTIN ) 875-125 MG tablet Take 1 tablet by mouth 2 (two) times daily. 14 tablet 0   ascorbic acid  (VITAMIN C ) 500 MG tablet Take 1,000 mg by mouth daily.     Budeson-Glycopyrrol-Formoterol  (BREZTRI  AEROSPHERE) 160-9-4.8 MCG/ACT AERO Inhale 2 puffs into the lungs in the morning and at bedtime. 10.7 g 3   carboxymethylcellulose 1 % ophthalmic solution Apply 1-2 drops to eye as needed (dry eye).     Cholecalciferol (VITAMIN D ) 50 MCG (2000 UT) CAPS Take by mouth.     ferrous sulfate  324 MG TBEC Take 1 tablet (324 mg total) by mouth daily with breakfast. 30 tablet 4   Homeopathic Products (CHESTAL HONEY COUGH PO) Take by mouth.     loratadine (CLARITIN) 10 MG tablet Take 10 mg by mouth daily.     midodrine  (PROAMATINE ) 5 MG tablet Take 1 tablet (5 mg total) by mouth 3 (three) times daily as needed (If your Systolic Blood Pressure is less than 90). 90 tablet 1   pantoprazole  (PROTONIX ) 40 MG tablet TAKE 1 TABLET BY MOUTH EVERY DAY BEFORE BREAKFAST 90 tablet 1   Probiotic Product (PROBIOTIC PO) Take 1 capsule by mouth daily.  prochlorperazine  (COMPAZINE ) 10 MG tablet Take 10 mg by mouth as needed for nausea or vomiting. Every 3 weeks before chemo     promethazine -dextromethorphan (PROMETHAZINE -DM) 6.25-15 MG/5ML syrup Take 5 mLs by mouth 4 (four) times daily as needed for cough. 118 mL 0   zinc gluconate 50 MG tablet Take 100 mg by mouth daily.     No current facility-administered medications for this visit.   Facility-Administered Medications  Ordered in Other Visits  Medication Dose Route Frequency Provider Last Rate Last Admin   pembrolizumab  (KEYTRUDA ) 200 mg in sodium chloride  0.9 % 50 mL chemo infusion  200 mg Intravenous Once Mohamed, Mohamed, MD        VITAL SIGNS: There were no vitals taken for this visit. There were no vitals filed for this visit.  Estimated body mass index is 29.43 kg/m as calculated from the following:   Height as of 10/12/23: 5' 1 (1.549 m).   Weight as of an earlier encounter on 10/28/23: 155 lb 12 oz (70.6 kg).   PERFORMANCE STATUS (ECOG) : 1 - Symptomatic but completely ambulatory   Physical Exam General: NAD Cardiovascular: regular rate and rhythm Pulmonary: normal breathing pattern, cough Extremities: no edema, no joint deformities Skin: no rashes Neurological: AAO x3  IMPRESSION: Discussed the use of AI scribe software for clinical note transcription with the patient, who gave verbal consent to proceed.  History of Present Illness Yvette Curry is a 68 year old female with non-small cell lung cancer who was seen during infusion. No acute distress noted. She is doing well overall. She is accompanied by her husband Emil. Appetite is good.   She is currently undergoing treatment for her stage IV NSCLC sharing with excitement that she has two more treatments scheduled for July 30th and August 21st. After the final treatment, she will return in three weeks for a follow-up, which will include a CT scan to assess the effectiveness of the treatment.  She is experiencing weight gain and notes that she is eating well, although she humorously mentions that her diet is not necessarily healthy. We discussed appreciation of her appetite and ability to enjoy foods that she has a desire for.   No other new symptoms noted. She is doing well overall. Denies concerns for nausea, vomiting, constipation, or diarrhea. We will continue to support and follow as needed. All questions answered.   I discussed  the importance of continued conversation with family and their medical providers regarding overall plan of care and treatment options, ensuring decisions are within the context of the patients values and GOCs.  Assessment & Plan  Stage IV NSCLC Undergoing treatment with concern about a lymph node and potential remission status. - Administer two more treatments on July 30th and August 21st per Oncology. - Schedule a CT scan before the follow-up appointment to evaluate treatment effectiveness. - Follow up in three weeks after the last treatment to assess progress.  Follow-up Doing well overall with no new concerns. - Schedule follow-up appointment in 3-4 weeks to reassess condition and needs.  Patient expressed understanding and was in agreement with this plan. She also understands that She can call the clinic at any time with any questions, concerns, or complaints.   Any controlled substances utilized were prescribed in the context of palliative care. PDMP has been reviewed.   Visit consisted of counseling and education dealing with the complex and emotionally intense issues of symptom management and palliative care in the setting of serious and potentially  life-threatening illness.  Levon Borer, AGPCNP-BC  Palliative Medicine Team/[facility]

## 2023-11-04 ENCOUNTER — Ambulatory Visit (INDEPENDENT_AMBULATORY_CARE_PROVIDER_SITE_OTHER): Admitting: Ophthalmology

## 2023-11-04 ENCOUNTER — Encounter (INDEPENDENT_AMBULATORY_CARE_PROVIDER_SITE_OTHER): Payer: Self-pay | Admitting: Ophthalmology

## 2023-11-04 DIAGNOSIS — H04123 Dry eye syndrome of bilateral lacrimal glands: Secondary | ICD-10-CM

## 2023-11-04 DIAGNOSIS — H353211 Exudative age-related macular degeneration, right eye, with active choroidal neovascularization: Secondary | ICD-10-CM

## 2023-11-04 DIAGNOSIS — H35711 Central serous chorioretinopathy, right eye: Secondary | ICD-10-CM | POA: Diagnosis not present

## 2023-11-04 DIAGNOSIS — Z961 Presence of intraocular lens: Secondary | ICD-10-CM

## 2023-11-04 DIAGNOSIS — C3491 Malignant neoplasm of unspecified part of right bronchus or lung: Secondary | ICD-10-CM

## 2023-11-04 MED ORDER — FARICIMAB-SVOA 6 MG/0.05ML IZ SOLN
6.0000 mg | INTRAVITREAL | Status: AC | PRN
Start: 2023-11-07 — End: 2023-11-07
  Administered 2023-11-07: 6 mg via INTRAVITREAL

## 2023-11-18 ENCOUNTER — Inpatient Hospital Stay

## 2023-11-18 ENCOUNTER — Encounter: Payer: Self-pay | Admitting: Nurse Practitioner

## 2023-11-18 ENCOUNTER — Inpatient Hospital Stay (HOSPITAL_BASED_OUTPATIENT_CLINIC_OR_DEPARTMENT_OTHER): Admitting: Internal Medicine

## 2023-11-18 ENCOUNTER — Inpatient Hospital Stay (HOSPITAL_BASED_OUTPATIENT_CLINIC_OR_DEPARTMENT_OTHER): Admitting: Nurse Practitioner

## 2023-11-18 VITALS — BP 137/79 | HR 80 | Temp 97.6°F | Resp 17 | Ht 61.0 in | Wt 156.0 lb

## 2023-11-18 VITALS — BP 142/82 | HR 81 | Resp 16

## 2023-11-18 DIAGNOSIS — Z515 Encounter for palliative care: Secondary | ICD-10-CM

## 2023-11-18 DIAGNOSIS — C3491 Malignant neoplasm of unspecified part of right bronchus or lung: Secondary | ICD-10-CM

## 2023-11-18 DIAGNOSIS — D701 Agranulocytosis secondary to cancer chemotherapy: Secondary | ICD-10-CM

## 2023-11-18 DIAGNOSIS — Z5112 Encounter for antineoplastic immunotherapy: Secondary | ICD-10-CM | POA: Diagnosis not present

## 2023-11-18 LAB — CBC WITH DIFFERENTIAL (CANCER CENTER ONLY)
Abs Immature Granulocytes: 0.02 K/uL (ref 0.00–0.07)
Basophils Absolute: 0 K/uL (ref 0.0–0.1)
Basophils Relative: 1 %
Eosinophils Absolute: 0.2 K/uL (ref 0.0–0.5)
Eosinophils Relative: 3 %
HCT: 38.4 % (ref 36.0–46.0)
Hemoglobin: 12.9 g/dL (ref 12.0–15.0)
Immature Granulocytes: 0 %
Lymphocytes Relative: 34 %
Lymphs Abs: 2.3 K/uL (ref 0.7–4.0)
MCH: 32.6 pg (ref 26.0–34.0)
MCHC: 33.6 g/dL (ref 30.0–36.0)
MCV: 97 fL (ref 80.0–100.0)
Monocytes Absolute: 0.6 K/uL (ref 0.1–1.0)
Monocytes Relative: 9 %
Neutro Abs: 3.6 K/uL (ref 1.7–7.7)
Neutrophils Relative %: 53 %
Platelet Count: 196 K/uL (ref 150–400)
RBC: 3.96 MIL/uL (ref 3.87–5.11)
RDW: 14.1 % (ref 11.5–15.5)
WBC Count: 6.8 K/uL (ref 4.0–10.5)
nRBC: 0 % (ref 0.0–0.2)

## 2023-11-18 LAB — CMP (CANCER CENTER ONLY)
ALT: 12 U/L (ref 0–44)
AST: 14 U/L — ABNORMAL LOW (ref 15–41)
Albumin: 3.9 g/dL (ref 3.5–5.0)
Alkaline Phosphatase: 71 U/L (ref 38–126)
Anion gap: 6 (ref 5–15)
BUN: 26 mg/dL — ABNORMAL HIGH (ref 8–23)
CO2: 30 mmol/L (ref 22–32)
Calcium: 10.2 mg/dL (ref 8.9–10.3)
Chloride: 106 mmol/L (ref 98–111)
Creatinine: 1.12 mg/dL — ABNORMAL HIGH (ref 0.44–1.00)
GFR, Estimated: 54 mL/min — ABNORMAL LOW (ref >60)
Glucose, Bld: 95 mg/dL (ref 70–99)
Potassium: 4.1 mmol/L (ref 3.5–5.1)
Sodium: 142 mmol/L (ref 135–145)
Total Bilirubin: 0.4 mg/dL (ref 0.0–1.2)
Total Protein: 6.9 g/dL (ref 6.5–8.1)

## 2023-11-18 MED ORDER — SODIUM CHLORIDE 0.9% FLUSH: 10.0000 mL | INTRAVENOUS | Status: DC | PRN

## 2023-11-18 MED ORDER — SODIUM CHLORIDE 0.9% FLUSH
10.0000 mL | Freq: Once | INTRAVENOUS | Status: AC
Start: 2023-11-18 — End: 2023-11-18
  Administered 2023-11-18: 10 mL

## 2023-11-18 MED ORDER — SODIUM CHLORIDE 0.9 % IV SOLN: Freq: Once | INTRAVENOUS | Status: AC

## 2023-11-18 MED ORDER — SODIUM CHLORIDE 0.9 % IV SOLN
200.0000 mg | Freq: Once | INTRAVENOUS | Status: AC
  Administered 2023-11-18: 200 mg via INTRAVENOUS
  Filled 2023-11-18: qty 200
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
## 2023-11-18 NOTE — Progress Notes (Signed)
 Patient had some chest tenderness around her port- discussed with Dr. Sherrod. Hestated to use it for treatment today.
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
## 2023-11-18 NOTE — Patient Instructions (Signed)
 [facility] - A DEPT OF [facility]  Discharge Instructions: Thank you for choosing [facility] to provide your oncology and hematology care.   If you have a lab appointment with the Cancer Center, please go directly to the Cancer Center and check in at the registration area.   Wear comfortable clothing and clothing appropriate for easy access to any Portacath or PICC line.   We strive to give you quality time with your provider. You may need to reschedule your appointment if you arrive late (15 or more minutes).  Arriving late affects you and other patients whose appointments are after yours.  Also, if you miss three or more appointments without notifying the office, you may be dismissed from the clinic at the provider's discretion.      For prescription refill requests, have your pharmacy contact our office and allow 72 hours for refills to be completed.    Today you received the following chemotherapy and/or immunotherapy agents Rande Lawman      To help prevent nausea and vomiting after your treatment, we encourage you to take your nausea medication as directed.  BELOW ARE SYMPTOMS THAT SHOULD BE REPORTED IMMEDIATELY: *FEVER GREATER THAN 100.4 F (38 C) OR HIGHER *CHILLS OR SWEATING *NAUSEA AND VOMITING THAT IS NOT CONTROLLED WITH YOUR NAUSEA MEDICATION *UNUSUAL SHORTNESS OF BREATH *UNUSUAL BRUISING OR BLEEDING *URINARY PROBLEMS (pain or burning when urinating, or frequent urination) *BOWEL PROBLEMS (unusual diarrhea, constipation, pain near the anus) TENDERNESS IN MOUTH AND THROAT WITH OR WITHOUT PRESENCE OF ULCERS (sore throat, sores in mouth, or a toothache) UNUSUAL RASH, SWELLING OR PAIN  UNUSUAL VAGINAL DISCHARGE OR ITCHING   Items with * indicate a potential emergency and should be followed up as soon as possible or go to the Emergency Department if any problems should occur.  Please show the CHEMOTHERAPY ALERT CARD or IMMUNOTHERAPY  ALERT CARD at check-in to the Emergency Department and triage nurse.  Should you have questions after your visit or need to cancel or reschedule your appointment, please contact [facility] - A DEPT OF [facility]  Dept: [phone]  and follow the prompts.  Office hours are 8:00 a.m. to 4:30 p.m. Monday - Friday. Please note that voicemails left after 4:00 p.m. may not be returned until the following business day.  We are closed weekends and major holidays. You have access to a nurse at all times for urgent questions. Please call the main number to the clinic Dept: [phone] and follow the prompts.   For any non-urgent questions, you may also contact your provider using MyChart. We now offer e-Visits for anyone 85 and older to request care online for non-urgent symptoms. For details visit [email or web address].   Also download the MyChart app! Go to the app store, search "MyChart", open the app, select , and log in with your MyChart username and password.

## 2023-11-18 NOTE — Progress Notes (Signed)
 Palliative Medicine [facility]  [phone] [phone]   Name: Yvette Curry Date: 11/18/2023 MRN: [id number]  DOB: 08-24-1955  Patient Care Team: [name], DO as PCP - General (Family Medicine) Mallipeddi, Diannah SQUIBB, MD as PCP - Cardiology (Cardiology) Carolee Sherwood Yvette DOUGLAS, MD as Consulting Physician (Urology) Pickenpack-Cousar, Yvette SAILOR, NP as Nurse Practitioner (Hospice and Palliative Medicine) Yvette Josette RAMAN, PA-C as Physician Assistant (Gastroenterology) Pickenpack-Cousar, Yvette SAILOR, NP as Nurse Practitioner (Hospice and Palliative Medicine) Heilingoetter, Yvette CROME, PA-C as Physician Assistant (Physician Assistant) Yvette Rogue, MD as Consulting Physician (Ophthalmology) Yvette Curry, Toribio, MD as Consulting Physician (Gastroenterology) Yvette Gaile ORN, MD as Consulting Physician (Vascular Surgery) Yvette Sherrod, MD as Consulting Physician (Oncology)    INTERVAL HISTORY: Yvette Curry is a 68 y.o. 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 Palliative ask to see for symptom management and goals of care.   SOCIAL HISTORY:     reports that she has been smoking cigarettes. She has a 18.5 pack-year smoking history. She has been exposed to tobacco smoke. She has never used smokeless tobacco. She reports that she does not drink alcohol  and does not use drugs.  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  MEDICATIONS:  Current Outpatient Medications  Medication Sig Dispense Refill   amoxicillin -clavulanate (AUGMENTIN ) 875-125 MG tablet Take 1 tablet by mouth 2 (two) times daily. 14 tablet 0   ascorbic acid  (VITAMIN C ) 500 MG tablet Take 1,000 mg by mouth daily.     Budeson-Glycopyrrol-Formoterol  (BREZTRI  AEROSPHERE) 160-9-4.8 MCG/ACT AERO Inhale 2 puffs into the lungs in the morning and at bedtime. 10.7 g 3   carboxymethylcellulose 1 % ophthalmic solution Apply 1-2 drops to eye as needed (dry eye).     Cholecalciferol (VITAMIN D ) 50 MCG (2000 UT) CAPS Take by mouth.     ferrous sulfate  324 MG TBEC Take 1 tablet (324 mg total) by mouth daily with breakfast. 30 tablet 4   Homeopathic Products (CHESTAL HONEY COUGH PO) Take by mouth.     loratadine (CLARITIN) 10 MG tablet Take 10 mg by mouth daily.     midodrine  (PROAMATINE ) 5 MG tablet Take 1 tablet (5 mg total) by mouth 3 (three) times daily as needed (If your Systolic Blood Pressure is less than 90). 90 tablet 1   pantoprazole  (PROTONIX ) 40 MG tablet TAKE 1 TABLET BY MOUTH EVERY DAY BEFORE BREAKFAST 90 tablet 1   Probiotic Product (PROBIOTIC PO) Take 1 capsule by mouth daily.  prochlorperazine  (COMPAZINE ) 10 MG tablet Take 10 mg by mouth as needed for nausea or vomiting. Every 3 weeks before chemo     promethazine -dextromethorphan (PROMETHAZINE -DM) 6.25-15 MG/5ML syrup Take 5 mLs by mouth 4 (four) times daily as needed for cough. 118 mL 0   zinc gluconate 50 MG tablet Take 100 mg by mouth daily.     No current facility-administered medications for this visit.   Facility-Administered Medications  Ordered in Other Visits  Medication Dose Route Frequency Provider Last Rate Last Admin   sodium chloride  flush (NS) 0.9 % injection 10 mL  10 mL Intracatheter PRN Yvette Sherrod, MD        VITAL SIGNS: There were no vitals taken for this visit. There were no vitals filed for this visit.  Estimated body mass index is 29.48 kg/m as calculated from the following:   Height as of an earlier encounter on 11/18/23: 5' 1 (1.549 m).   Weight as of an earlier encounter on 11/18/23: 156 lb (70.8 kg).   PERFORMANCE STATUS (ECOG) : 1 - Symptomatic but completely ambulatory   Physical Exam General: NAD Cardiovascular: regular rate and rhythm Pulmonary: normal breathing pattern, cough Extremities: no edema, no joint deformities Skin: no rashes Neurological: AAO x3  IMPRESSION: Discussed the use of AI scribe software for clinical note transcription with the patient, who gave verbal consent to proceed.  History of Present Illness [name] is a 68 year old female with non-small cell lung cancer who was seen during infusion. No acute distress noted. She is doing well overall. She is accompanied by her husband Emil. Appetite is good.   She has been experiencing sensitivity in her port for a long time. The sensation is bothersome, but she does not provide further details on the location or radiation of the sensitivity. Denies pain during active treatment.   Patient shares that she is offering support to her sister who lives in [place]  and recently diagnoses with Follicular cancer undergoing initial treatments. Emotional support provided.   No other new symptoms noted. She is doing well overall. Denies concerns for nausea, vomiting, constipation, or diarrhea. We will continue to support and follow as needed. All questions answered.   I discussed the importance of continued conversation with family and their medical providers regarding overall plan of care and treatment options, ensuring  decisions are within the context of the patients values and GOCs.  Assessment & Plan  All symptoms well managed. No needs at this time.   Follow-up Doing well overall with no new concerns. - Schedule follow-up appointment in 3-4 weeks to reassess condition and needs.   Patient expressed understanding and was in agreement with this plan. She also understands that She can call the clinic at any time with any questions, concerns, or complaints.   Any controlled substances utilized were prescribed in the context of palliative care. PDMP has been reviewed.   Visit consisted of counseling and education dealing with the complex and emotionally intense issues of symptom management and palliative care in the setting of serious and potentially life-threatening illness.  Levon Borer, AGPCNP-BC  Palliative Medicine Team/[facility]

## 2023-11-30 ENCOUNTER — Other Ambulatory Visit: Payer: Self-pay | Admitting: Family Medicine

## 2023-11-30 DIAGNOSIS — Z1231 Encounter for screening mammogram for malignant neoplasm of breast: Secondary | ICD-10-CM

## 2023-12-01 ENCOUNTER — Telehealth: Payer: Self-pay | Admitting: Medical Oncology

## 2023-12-01 ENCOUNTER — Telehealth: Payer: Self-pay

## 2023-12-01 ENCOUNTER — Other Ambulatory Visit: Payer: Self-pay | Admitting: Family Medicine

## 2023-12-01 NOTE — Telephone Encounter (Signed)
 Communication  Reason for CRM: patient called to request a bone density test. Please f/u with patient with an appt

## 2023-12-01 NOTE — Telephone Encounter (Signed)
 She is still having pain around her port for about 2-3 months now. She wants someone to look at it.

## 2023-12-02 ENCOUNTER — Telehealth: Payer: Self-pay

## 2023-12-02 ENCOUNTER — Other Ambulatory Visit: Payer: Self-pay

## 2023-12-02 ENCOUNTER — Encounter (INDEPENDENT_AMBULATORY_CARE_PROVIDER_SITE_OTHER): Admitting: Ophthalmology

## 2023-12-02 ENCOUNTER — Ambulatory Visit (INDEPENDENT_AMBULATORY_CARE_PROVIDER_SITE_OTHER): Admitting: Ophthalmology

## 2023-12-02 ENCOUNTER — Encounter (INDEPENDENT_AMBULATORY_CARE_PROVIDER_SITE_OTHER): Payer: Self-pay | Admitting: Ophthalmology

## 2023-12-02 DIAGNOSIS — H35711 Central serous chorioretinopathy, right eye: Secondary | ICD-10-CM

## 2023-12-02 DIAGNOSIS — Z961 Presence of intraocular lens: Secondary | ICD-10-CM

## 2023-12-02 DIAGNOSIS — H04123 Dry eye syndrome of bilateral lacrimal glands: Secondary | ICD-10-CM | POA: Diagnosis not present

## 2023-12-02 DIAGNOSIS — H353211 Exudative age-related macular degeneration, right eye, with active choroidal neovascularization: Secondary | ICD-10-CM | POA: Diagnosis not present

## 2023-12-02 DIAGNOSIS — Z452 Encounter for adjustment and management of vascular access device: Secondary | ICD-10-CM

## 2023-12-02 DIAGNOSIS — C3491 Malignant neoplasm of unspecified part of right bronchus or lung: Secondary | ICD-10-CM

## 2023-12-02 MED ORDER — FARICIMAB-SVOA 6 MG/0.05ML IZ SOLN
6.0000 mg | INTRAVITREAL | Status: AC | PRN
  Administered 2023-12-02 (×2): 6 mg via INTRAVITREAL

## 2023-12-02 NOTE — Telephone Encounter (Signed)
 Spoke with patient in regards to pain around her port.  Per Dr. Sherrod, patients port can be evaluated in IR.  Order placed. Appt made with Tiffany in IR for Friday 12/04/23 @ 1 PM. Patient is aware.

## 2023-12-03 ENCOUNTER — Other Ambulatory Visit: Payer: Self-pay | Admitting: Medical Oncology

## 2023-12-04 ENCOUNTER — Ambulatory Visit (HOSPITAL_COMMUNITY)
Admission: RE | Admit: 2023-12-04 | Discharge: 2023-12-04 | Disposition: A | Discharge status: Home / Self Care | Source: Ambulatory Visit | Attending: Internal Medicine | Admitting: Internal Medicine

## 2023-12-04 ENCOUNTER — Ambulatory Visit (HOSPITAL_COMMUNITY)
Admission: RE | Admit: 2023-12-04 | Discharge: 2023-12-04 | Disposition: A | Discharge status: Home / Self Care | Source: Ambulatory Visit | Attending: Radiology | Admitting: Radiology

## 2023-12-04 ENCOUNTER — Encounter (HOSPITAL_COMMUNITY): Payer: Self-pay | Admitting: Radiology

## 2023-12-04 ENCOUNTER — Other Ambulatory Visit: Payer: Self-pay | Admitting: Internal Medicine

## 2023-12-04 DIAGNOSIS — Z452 Encounter for adjustment and management of vascular access device: Secondary | ICD-10-CM

## 2023-12-04 DIAGNOSIS — C3491 Malignant neoplasm of unspecified part of right bronchus or lung: Secondary | ICD-10-CM | POA: Insufficient documentation

## 2023-12-04 HISTORY — PX: IR PATIENT EVAL TECH 0-60 MINS: IMG5564

## 2023-12-04 NOTE — Progress Notes (Signed)
 Brief Interventional Radiology Note:   Patient presents with her husband for a port site check. States that she has been having worsening pain in the area of her port and catheter for the past 2-3 months. She denies any inciting event, any skin changes, fevers/chills, or sharp, stabbing pain to the area. States that it feels like a bruise. Reports she used to only notice it intermittently, but now feels the ache constantly.   Husband reports that at her last infusion, there was concern that her port may have become flipped. States that her port was raised against her skin more than normal and the nurse was unable to access it. He was able to reposition it with manual manipulation allowing for access and successful infusion. Otherwise, no issues accessing the port site.  Patient reports her last infusion is scheduled for 8/21. She will have follow up CT scans for restaging. Pending CT read, she could potentially have the port removed; however, she does not want it removed until they are sure she does not require more infusions.   Port site is unremarkable. Skin is non-erythematous and without signs of infection. Area is minimally tender to palpation, more so superiorly toward the collarbone.   DG Chest ordered to evaluate for port positioning.  Imaging reviewed with Dr. Luverne who reports port is appropriately positioned. Catheter tip on the central SVC. No intervention necessary at this time.  Discussed imaging results with patient as well as recommendations for pain control and prevention moving forward. Patient and husband both verbalized understanding.   Thank you for allowing the IR team to participate in Yvette Curry's care.  Electronically Signed: Avianah Pellman M Linette Gunderson, PA-C 12/04/2023, 2:01 PM

## 2023-12-04 NOTE — Procedures (Signed)
 Spencer Glennon HERO, PA-C  Physician Assistant Certified Radiology   Progress Notes    Signed   Date of Service: 12/04/2023  1:30 PM   Signed      Brief Interventional Radiology Note:    Patient presents with her husband for a port site check. States that she has been having worsening pain in the area of her port and catheter for the past 2-3 months. She denies any inciting event, any skin changes, fevers/chills, or sharp, stabbing pain to the area. States that it feels like a bruise. Reports she used to only notice it intermittently, but now feels the ache constantly.    Husband reports that at her last infusion, there was concern that her port may have become flipped. States that her port was raised against her skin more than normal and the nurse was unable to access it. He was able to reposition it with manual manipulation allowing for access and successful infusion. Otherwise, no issues accessing the port site.   Patient reports her last infusion is scheduled for 8/21. She will have follow up CT scans for restaging. Pending CT read, she could potentially have the port removed; however, she does not want it removed until they are sure she does not require more infusions.    Port site is unremarkable. Skin is non-erythematous and without signs of infection. Area is minimally tender to palpation, more so superiorly toward the collarbone.    DG Chest ordered to evaluate for port positioning.   Imaging reviewed with Dr. Luverne who reports port is appropriately positioned. Catheter tip on the central SVC. No intervention necessary at this time.   Discussed imaging results with patient as well as recommendations for pain control and prevention moving forward. Patient and husband both verbalized understanding.    Thank you for allowing the IR team to participate in Yvette Curry's care.   Electronically Signed: Caitlyn M McInnis, PA-C 12/04/2023, 2:01 PM

## 2023-12-07 ENCOUNTER — Telehealth: Payer: Self-pay | Admitting: *Deleted

## 2023-12-07 NOTE — Telephone Encounter (Signed)
 Copied from CRM #[id number]. Topic: Clinical - Request for Lab/Test Order >> Dec 07, 2023  2:02 PM Turkey B wrote: Reason for CRM: Patient called in, wants bone density test ordered

## 2023-12-07 NOTE — Telephone Encounter (Signed)
 Patient was notified and verbalized understanding.

## 2023-12-07 NOTE — Telephone Encounter (Signed)
 Cook, [name], DO      12/07/23  3:50 PM Last Dexa was last year. I don't believe insurance will cover until 2 years.
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
## 2023-12-10 ENCOUNTER — Inpatient Hospital Stay

## 2023-12-10 ENCOUNTER — Inpatient Hospital Stay: Attending: Internal Medicine

## 2023-12-10 ENCOUNTER — Inpatient Hospital Stay: Admitting: Nurse Practitioner

## 2023-12-10 ENCOUNTER — Encounter: Payer: Self-pay | Admitting: Nurse Practitioner

## 2023-12-10 ENCOUNTER — Inpatient Hospital Stay (HOSPITAL_BASED_OUTPATIENT_CLINIC_OR_DEPARTMENT_OTHER): Admitting: Physician Assistant

## 2023-12-10 ENCOUNTER — Telehealth: Payer: Self-pay | Admitting: Internal Medicine

## 2023-12-10 VITALS — BP 131/85 | HR 88 | Temp 98.4°F | Resp 16 | Ht 61.0 in | Wt 155.5 lb

## 2023-12-10 DIAGNOSIS — Z515 Encounter for palliative care: Secondary | ICD-10-CM

## 2023-12-10 DIAGNOSIS — C3490 Malignant neoplasm of unspecified part of unspecified bronchus or lung: Secondary | ICD-10-CM | POA: Diagnosis not present

## 2023-12-10 DIAGNOSIS — T451X5A Adverse effect of antineoplastic and immunosuppressive drugs, initial encounter: Secondary | ICD-10-CM

## 2023-12-10 DIAGNOSIS — C3491 Malignant neoplasm of unspecified part of right bronchus or lung: Secondary | ICD-10-CM

## 2023-12-10 DIAGNOSIS — D649 Anemia, unspecified: Secondary | ICD-10-CM | POA: Insufficient documentation

## 2023-12-10 DIAGNOSIS — C3411 Malignant neoplasm of upper lobe, right bronchus or lung: Secondary | ICD-10-CM | POA: Insufficient documentation

## 2023-12-10 DIAGNOSIS — Z5112 Encounter for antineoplastic immunotherapy: Secondary | ICD-10-CM | POA: Insufficient documentation

## 2023-12-10 DIAGNOSIS — Z79899 Other long term (current) drug therapy: Secondary | ICD-10-CM | POA: Diagnosis not present

## 2023-12-10 DIAGNOSIS — C778 Secondary and unspecified malignant neoplasm of lymph nodes of multiple regions: Secondary | ICD-10-CM | POA: Diagnosis present

## 2023-12-10 DIAGNOSIS — Z90710 Acquired absence of both cervix and uterus: Secondary | ICD-10-CM | POA: Insufficient documentation

## 2023-12-10 DIAGNOSIS — Z90722 Acquired absence of ovaries, bilateral: Secondary | ICD-10-CM | POA: Insufficient documentation

## 2023-12-10 DIAGNOSIS — F17210 Nicotine dependence, cigarettes, uncomplicated: Secondary | ICD-10-CM | POA: Diagnosis not present

## 2023-12-10 LAB — CBC WITH DIFFERENTIAL (CANCER CENTER ONLY)
Abs Immature Granulocytes: 0.02 K/uL (ref 0.00–0.07)
Basophils Absolute: 0.1 K/uL (ref 0.0–0.1)
Basophils Relative: 1 %
Eosinophils Absolute: 0.2 K/uL (ref 0.0–0.5)
Eosinophils Relative: 4 %
HCT: 38.3 % (ref 36.0–46.0)
Hemoglobin: 12.8 g/dL (ref 12.0–15.0)
Immature Granulocytes: 0 %
Lymphocytes Relative: 33 %
Lymphs Abs: 1.9 K/uL (ref 0.7–4.0)
MCH: 32.1 pg (ref 26.0–34.0)
MCHC: 33.4 g/dL (ref 30.0–36.0)
MCV: 96 fL (ref 80.0–100.0)
Monocytes Absolute: 0.5 K/uL (ref 0.1–1.0)
Monocytes Relative: 8 %
Neutro Abs: 3.1 K/uL (ref 1.7–7.7)
Neutrophils Relative %: 54 %
Platelet Count: 186 K/uL (ref 150–400)
RBC: 3.99 MIL/uL (ref 3.87–5.11)
RDW: 13.9 % (ref 11.5–15.5)
WBC Count: 5.8 K/uL (ref 4.0–10.5)
nRBC: 0 % (ref 0.0–0.2)

## 2023-12-10 LAB — CMP (CANCER CENTER ONLY)
ALT: 12 U/L (ref 0–44)
AST: 16 U/L (ref 15–41)
Albumin: 4 g/dL (ref 3.5–5.0)
Alkaline Phosphatase: 62 U/L (ref 38–126)
Anion gap: 4 — ABNORMAL LOW (ref 5–15)
BUN: 14 mg/dL (ref 8–23)
CO2: 30 mmol/L (ref 22–32)
Calcium: 9.8 mg/dL (ref 8.9–10.3)
Chloride: 107 mmol/L (ref 98–111)
Creatinine: 1.13 mg/dL — ABNORMAL HIGH (ref 0.44–1.00)
GFR, Estimated: 54 mL/min — ABNORMAL LOW (ref >60)
Glucose, Bld: 86 mg/dL (ref 70–99)
Potassium: 4 mmol/L (ref 3.5–5.1)
Sodium: 141 mmol/L (ref 135–145)
Total Bilirubin: 0.4 mg/dL (ref 0.0–1.2)
Total Protein: 6.6 g/dL (ref 6.5–8.1)

## 2023-12-10 MED ORDER — SODIUM CHLORIDE 0.9 % IV SOLN
200.0000 mg | Freq: Once | INTRAVENOUS | Status: AC
  Administered 2023-12-10: 200 mg via INTRAVENOUS
  Filled 2023-12-10: qty 200

## 2023-12-10 MED ORDER — SODIUM CHLORIDE 0.9% FLUSH
10.0000 mL | Freq: Once | INTRAVENOUS | Status: AC
  Administered 2023-12-10: 10 mL

## 2023-12-10 MED ORDER — SODIUM CHLORIDE 0.9 % IV SOLN: Freq: Once | INTRAVENOUS | Status: AC

## 2023-12-10 NOTE — Progress Notes (Signed)
 Palliative Medicine [facility]  Telephone:(336) [phone] Fax:(336) [phone]   Name: Yvette Curry Date: 12/10/2023 MRN: [id number]  DOB: [birth year]  Patient Care Team: [name], DO as PCP - General (Family Medicine) Mallipeddi, Diannah SQUIBB, MD as PCP - Cardiology (Cardiology) Carolee Sherwood JONETTA DOUGLAS, MD as Consulting Physician (Urology) Pickenpack-Cousar, Fannie SAILOR, NP as Nurse Practitioner (Hospice and Palliative Medicine) Rudy Josette RAMAN, PA-C as Physician Assistant (Gastroenterology) Pickenpack-Cousar, Fannie SAILOR, NP as Nurse Practitioner (Hospice and Palliative Medicine) Heilingoetter, Calton CROME, PA-C as Physician Assistant (Physician Assistant) Valdemar Rogue, MD as Consulting Physician (Ophthalmology) Eartha Flavors, Toribio, MD as Consulting Physician (Gastroenterology) Serene Gaile ORN, MD as Consulting Physician (Vascular Surgery) Sherrod Sherrod, MD as Consulting Physician (Oncology)    INTERVAL HISTORY: Yvette Curry is a 68 y.o. 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 Palliative ask to see for symptom management and goals of care.   SOCIAL HISTORY:     reports that she has been smoking cigarettes. She has a 18.5 pack-year smoking history. She has been exposed to tobacco smoke. She has never used smokeless tobacco. She reports that she does not drink alcohol  and does not use drugs.  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  MEDICATIONS:  Current Outpatient Medications  Medication Sig Dispense Refill   amoxicillin -clavulanate (AUGMENTIN ) 875-125 MG tablet Take 1 tablet by mouth 2 (two) times daily. 14 tablet 0   ascorbic acid  (VITAMIN C ) 500 MG tablet Take 1,000 mg by mouth daily.     Budeson-Glycopyrrol-Formoterol  (BREZTRI  AEROSPHERE) 160-9-4.8 MCG/ACT AERO Inhale 2 puffs into the lungs in the morning and at bedtime. 10.7 g 3   carboxymethylcellulose 1 % ophthalmic solution Apply 1-2 drops to eye as needed (dry eye).     Cholecalciferol (VITAMIN D ) 50 MCG (2000 UT) CAPS Take by mouth.     ferrous sulfate  324 MG TBEC Take 1 tablet (324 mg total) by mouth daily with breakfast. 30 tablet 4   Homeopathic Products (CHESTAL HONEY COUGH PO) Take by mouth.     loratadine (CLARITIN) 10 MG tablet Take 10 mg by mouth daily.     midodrine  (PROAMATINE ) 5 MG tablet Take 1 tablet (5 mg total) by mouth 3 (three) times daily as needed (If your Systolic Blood Pressure is less than 90). 90 tablet 1   pantoprazole  (PROTONIX ) 40 MG tablet TAKE 1 TABLET BY MOUTH EVERY DAY BEFORE BREAKFAST 90 tablet 1   Probiotic Product (PROBIOTIC PO) Take 1 capsule by mouth daily.  prochlorperazine  (COMPAZINE ) 10 MG tablet Take 10 mg by mouth as needed for nausea or vomiting. Every 3 weeks before chemo     promethazine -dextromethorphan (PROMETHAZINE -DM) 6.25-15 MG/5ML syrup Take 5 mLs by mouth 4 (four) times daily as needed for cough. 118 mL 0   zinc gluconate 50 MG tablet Take 100 mg by mouth daily.     No current facility-administered medications for this visit.    VITAL SIGNS: There were no  vitals taken for this visit. There were no vitals filed for this visit.  Estimated body mass index is 29.38 kg/m as calculated from the following:   Height as of an earlier encounter on 12/10/23: 5' 1 (1.549 m).   Weight as of an earlier encounter on 12/10/23: 155 lb 8 oz (70.5 kg).   PERFORMANCE STATUS (ECOG) : 1 - Symptomatic but completely ambulatory  Physical Exam General: NAD Cardiovascular: regular rate and rhythm Pulmonary: normal breathing pattern, cough Extremities: no edema, no joint deformities Skin: no rashes Neurological: AAO x3  IMPRESSION: Discussed the use of AI scribe software for clinical note transcription with the patient, who gave verbal consent to proceed.  History of Present Illness Yvette Curry is a 68 year old female with non-small cell lung cancer who was seen during infusion. No acute distress noted. She is doing well overall. She is accompanied by her husband Emil. Appetite is good.   She is celebrating her final treatment today. Plans to keep her port in for worst case scenario and use for labs etc.   No uncontrolled symptoms at this time.   We will continue to follow-up with patient as needed. All questions answered and support.   Assessment & Plan Surveillance following treatment for malignant neoplasm Undergoing surveillance post-treatment for malignant neoplasm with concerns about potential mutation or spread. - Schedule scan to monitor for recurrence or progression of malignant neoplasm. - Plan follow-up visit in three weeks to review scan results and discuss further management.  Patient expressed understanding and was in agreement with this plan. She also understands that She can call the clinic at any time with any questions, concerns, or complaints.   Any controlled substances utilized were prescribed in the context of palliative care. PDMP has been reviewed.   Visit consisted of counseling and education dealing with the complex and  emotionally intense issues of symptom management and palliative care in the setting of serious and potentially life-threatening illness.  Levon Borer, AGPCNP-BC  Palliative Medicine Team/[facility]

## 2023-12-10 NOTE — Telephone Encounter (Signed)
 Scheduled appointments with the patient per LOS.

## 2023-12-18 ENCOUNTER — Encounter: Payer: Self-pay | Admitting: Gastroenterology
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
## 2023-12-24 ENCOUNTER — Ambulatory Visit (HOSPITAL_COMMUNITY)

## 2023-12-24 ENCOUNTER — Ambulatory Visit (HOSPITAL_BASED_OUTPATIENT_CLINIC_OR_DEPARTMENT_OTHER)
Admission: RE | Admit: 2023-12-24 | Discharge: 2023-12-24 | Disposition: A | Discharge status: Home / Self Care | Source: Ambulatory Visit | Attending: Physician Assistant | Admitting: Physician Assistant

## 2023-12-24 ENCOUNTER — Encounter (HOSPITAL_BASED_OUTPATIENT_CLINIC_OR_DEPARTMENT_OTHER): Payer: Self-pay

## 2023-12-24 DIAGNOSIS — C3490 Malignant neoplasm of unspecified part of unspecified bronchus or lung: Secondary | ICD-10-CM | POA: Insufficient documentation

## 2023-12-24 MED ORDER — IOHEXOL 300 MG/ML  SOLN
100.0000 mL | Freq: Once | INTRAMUSCULAR | Status: AC | PRN
  Administered 2023-12-24: 100 mL via INTRAVENOUS
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
## 2023-12-30 ENCOUNTER — Other Ambulatory Visit: Payer: Self-pay

## 2023-12-30 ENCOUNTER — Ambulatory Visit (INDEPENDENT_AMBULATORY_CARE_PROVIDER_SITE_OTHER): Admitting: Ophthalmology

## 2023-12-30 ENCOUNTER — Encounter (INDEPENDENT_AMBULATORY_CARE_PROVIDER_SITE_OTHER): Payer: Self-pay | Admitting: Ophthalmology

## 2023-12-30 DIAGNOSIS — H353211 Exudative age-related macular degeneration, right eye, with active choroidal neovascularization: Secondary | ICD-10-CM

## 2023-12-30 DIAGNOSIS — Z961 Presence of intraocular lens: Secondary | ICD-10-CM | POA: Diagnosis not present

## 2023-12-30 DIAGNOSIS — H35711 Central serous chorioretinopathy, right eye: Secondary | ICD-10-CM

## 2023-12-30 DIAGNOSIS — C3491 Malignant neoplasm of unspecified part of right bronchus or lung: Secondary | ICD-10-CM

## 2023-12-30 DIAGNOSIS — H04123 Dry eye syndrome of bilateral lacrimal glands: Secondary | ICD-10-CM | POA: Diagnosis not present

## 2023-12-30 MED ORDER — FARICIMAB-SVOA 6 MG/0.05ML IZ SOSY
6.0000 mg | PREFILLED_SYRINGE | INTRAVITREAL | Status: AC | PRN
  Administered 2023-12-30: 6 mg via INTRAVITREAL

## 2023-12-31 ENCOUNTER — Inpatient Hospital Stay: Attending: Internal Medicine

## 2023-12-31 ENCOUNTER — Inpatient Hospital Stay (HOSPITAL_BASED_OUTPATIENT_CLINIC_OR_DEPARTMENT_OTHER): Admitting: Physician Assistant

## 2023-12-31 VITALS — BP 127/75 | HR 87 | Temp 97.3°F | Resp 16 | Ht 61.0 in | Wt 157.0 lb

## 2023-12-31 DIAGNOSIS — C778 Secondary and unspecified malignant neoplasm of lymph nodes of multiple regions: Secondary | ICD-10-CM | POA: Insufficient documentation

## 2023-12-31 DIAGNOSIS — C3491 Malignant neoplasm of unspecified part of right bronchus or lung: Secondary | ICD-10-CM

## 2023-12-31 DIAGNOSIS — H3581 Retinal edema: Secondary | ICD-10-CM | POA: Diagnosis not present

## 2023-12-31 DIAGNOSIS — D649 Anemia, unspecified: Secondary | ICD-10-CM | POA: Diagnosis not present

## 2023-12-31 DIAGNOSIS — C3411 Malignant neoplasm of upper lobe, right bronchus or lung: Secondary | ICD-10-CM | POA: Insufficient documentation

## 2023-12-31 DIAGNOSIS — Z79899 Other long term (current) drug therapy: Secondary | ICD-10-CM | POA: Diagnosis not present

## 2023-12-31 DIAGNOSIS — Z9221 Personal history of antineoplastic chemotherapy: Secondary | ICD-10-CM | POA: Diagnosis not present

## 2023-12-31 LAB — CBC WITH DIFFERENTIAL (CANCER CENTER ONLY)
Abs Immature Granulocytes: 0.02 K/uL (ref 0.00–0.07)
Basophils Absolute: 0.1 K/uL (ref 0.0–0.1)
Basophils Relative: 1 %
Eosinophils Absolute: 0.2 K/uL (ref 0.0–0.5)
Eosinophils Relative: 3 %
HCT: 38.4 % (ref 36.0–46.0)
Hemoglobin: 13 g/dL (ref 12.0–15.0)
Immature Granulocytes: 0 %
Lymphocytes Relative: 27 %
Lymphs Abs: 2 K/uL (ref 0.7–4.0)
MCH: 32.7 pg (ref 26.0–34.0)
MCHC: 33.9 g/dL (ref 30.0–36.0)
MCV: 96.7 fL (ref 80.0–100.0)
Monocytes Absolute: 0.5 K/uL (ref 0.1–1.0)
Monocytes Relative: 7 %
Neutro Abs: 4.5 K/uL (ref 1.7–7.7)
Neutrophils Relative %: 62 %
Platelet Count: 185 K/uL (ref 150–400)
RBC: 3.97 MIL/uL (ref 3.87–5.11)
RDW: 13.7 % (ref 11.5–15.5)
WBC Count: 7.3 K/uL (ref 4.0–10.5)
nRBC: 0 % (ref 0.0–0.2)

## 2023-12-31 LAB — CMP (CANCER CENTER ONLY)
ALT: 11 U/L (ref 0–44)
AST: 13 U/L — ABNORMAL LOW (ref 15–41)
Albumin: 4 g/dL (ref 3.5–5.0)
Alkaline Phosphatase: 72 U/L (ref 38–126)
Anion gap: 5 (ref 5–15)
BUN: 25 mg/dL — ABNORMAL HIGH (ref 8–23)
CO2: 31 mmol/L (ref 22–32)
Calcium: 9.7 mg/dL (ref 8.9–10.3)
Chloride: 105 mmol/L (ref 98–111)
Creatinine: 1.11 mg/dL — ABNORMAL HIGH (ref 0.44–1.00)
GFR, Estimated: 55 mL/min — ABNORMAL LOW (ref >60)
Glucose, Bld: 87 mg/dL (ref 70–99)
Potassium: 4.2 mmol/L (ref 3.5–5.1)
Sodium: 141 mmol/L (ref 135–145)
Total Bilirubin: 0.3 mg/dL (ref 0.0–1.2)
Total Protein: 6.8 g/dL (ref 6.5–8.1)

## 2024-01-04 ENCOUNTER — Ambulatory Visit
Admission: RE | Admit: 2024-01-04 | Discharge: 2024-01-04 | Disposition: A | Discharge status: Home / Self Care | Source: Ambulatory Visit | Attending: Family Medicine | Admitting: Family Medicine

## 2024-01-04 ENCOUNTER — Encounter (INDEPENDENT_AMBULATORY_CARE_PROVIDER_SITE_OTHER): Payer: Self-pay | Admitting: Ophthalmology

## 2024-01-04 DIAGNOSIS — Z1231 Encounter for screening mammogram for malignant neoplasm of breast: Secondary | ICD-10-CM

## 2024-01-11 ENCOUNTER — Ambulatory Visit (INDEPENDENT_AMBULATORY_CARE_PROVIDER_SITE_OTHER): Admitting: Family Medicine

## 2024-01-11 ENCOUNTER — Encounter: Payer: Self-pay | Admitting: Family Medicine

## 2024-01-11 ENCOUNTER — Encounter: Payer: Self-pay | Admitting: Physician Assistant

## 2024-01-11 VITALS — BP 130/84 | HR 90 | Ht 61.0 in | Wt 157.0 lb

## 2024-01-11 DIAGNOSIS — M546 Pain in thoracic spine: Secondary | ICD-10-CM | POA: Diagnosis not present

## 2024-01-11 DIAGNOSIS — M545 Low back pain, unspecified: Secondary | ICD-10-CM

## 2024-01-11 DIAGNOSIS — R8281 Pyuria: Secondary | ICD-10-CM | POA: Insufficient documentation

## 2024-01-11 LAB — POCT URINALYSIS DIPSTICK
Bilirubin, UA: NEGATIVE
Glucose, UA: NEGATIVE
Ketones, UA: NEGATIVE
Nitrite, UA: NEGATIVE
Protein, UA: NEGATIVE
Spec Grav, UA: 1.015 (ref 1.010–1.025)
pH, UA: 7.5 (ref 5.0–8.0)

## 2024-01-11 NOTE — Assessment & Plan Note (Signed)
 UA with moderate leukocytes. Sending culture.

## 2024-01-11 NOTE — Assessment & Plan Note (Signed)
 Recommend heat and tylenol . Referring to PT.

## 2024-01-11 NOTE — Patient Instructions (Signed)
 Referral placed.  Tylenol  as needed. Topical Voltaren as needed.   Take care  Dr. Bluford

## 2024-01-11 NOTE — Progress Notes (Signed)
 Subjective:  Patient ID: Yvette Curry, female    DOB: 04-08-1956  Age: 68 y.o. MRN: [id number]  CC:   Chief Complaint  Patient presents with   Back Pain    Sister history of back cancer and mom history of kidney issues    HPI:  68 year old female with the below mentioned medical problems presents for evaluation of the above.  Patient reports lower thoracic and low back pain for the past 2 months.  No recent fall or injury. She does report that she has not been very active. Recent CT scan revealed lumbar spondylosis. She is concerned about this. Has seen chiropractor who informed her that this may be kidney related. Denies urinary symptoms.   Patient also reports stiffness of the digits of her left hand.   Patient Active Problem List   Diagnosis Date Noted   Back pain of thoracolumbar region 01/11/2024   Pyuria 01/11/2024   Lower extremity edema 05/21/2023   Dysphagia 05/14/2023   Gastroesophageal reflux disease 05/14/2023   Pericardial effusion 04/10/2023   Bilateral pleural effusion 03/30/2023   Encounter for antineoplastic immunotherapy 03/11/2023   CAD (coronary artery disease) 02/17/2023   Ascending aorta dilation 02/17/2023   Macrocytic anemia 12/10/2022   Heart failure with improved ejection fraction (HFimpEF) (HCC) 07/01/2022   Aortic atherosclerosis 06/04/2022   COPD (chronic obstructive pulmonary disease) (HCC) 06/04/2022   Neutropenia 03/27/2022   Adenocarcinoma of right lung, stage 4 (HCC) 12/12/2021   Tobacco abuse 11/06/2014    Social Hx   Social History   Socioeconomic History   Marital status: Married    Spouse name: Emil   Number of children: Y   Years of education: Not on file   Highest education level: GED or equivalent  Occupational History   Occupation: Personnel officer: UNEMPLOYED  Tobacco Use   Smoking status: Every Day    Current packs/day: 0.50    Average packs/day: 0.5 packs/day for 37.0 years (18.5 ttl pk-yrs)    Types:  Cigarettes    Passive exposure: Current   Smokeless tobacco: Never   Tobacco comments:    Smokes half a pack of cigarettes a day. 12/18/2022 Tay  Vaping Use   Vaping status: Former  Substance and Sexual Activity   Alcohol  use: No   Drug use: No   Sexual activity: Yes    Comment: hysterectomy  Other Topics Concern   Not on file  Social History Narrative   Not on file   Social Drivers of Health   Financial Resource Strain: Low Risk  (01/07/2024)   Overall Financial Resource Strain (CARDIA)    Difficulty of Paying Living Expenses: Not hard at all  Food Insecurity: No Food Insecurity (01/07/2024)   Hunger Vital Sign    Worried About Running Out of Food in the Last Year: Never true    Ran Out of Food in the Last Year: Never true  Transportation Needs: No Transportation Needs (01/07/2024)   PRAPARE - Administrator, Civil Service (Medical): No    Lack of Transportation (Non-Medical): No  Physical Activity: Inactive (01/07/2024)   Exercise Vital Sign    Days of Exercise per Week: 0 days    Minutes of Exercise per Session: Not on file  Stress: No Stress Concern Present (01/07/2024)   [employer] of Occupational Health - Occupational Stress Questionnaire    Feeling of Stress: Not at all  Social Connections: Socially Integrated (01/07/2024)   Social Connection and Isolation  Panel    Frequency of Communication with Friends and Family: More than three times a week    Frequency of Social Gatherings with Friends and Family: Three times a week    Attends Religious Services: More than 4 times per year    Active Member of Clubs or Organizations: Yes    Attends Banker Meetings: 1 to 4 times per year    Marital Status: Married    Review of Systems Per HPI  Objective:  BP 130/84   Pulse 90   Ht 5' 1 (1.549 m)   Wt 157 lb (71.2 kg)   SpO2 97%   BMI 29.66 kg/m      01/11/2024    9:21 AM 12/31/2023    1:50 PM 12/10/2023    8:22 AM  BP/Weight  Systolic  BP 130 872 131  Diastolic BP 84 75 85  Wt. (Lbs) 157 157 155.5  BMI 29.66 kg/m2 29.66 kg/m2 29.38 kg/m2    Physical Exam Vitals and nursing note reviewed.  Constitutional:      General: She is not in acute distress.    Appearance: Normal appearance.  HENT:     Head: Normocephalic and atraumatic.  Cardiovascular:     Rate and Rhythm: Normal rate and regular rhythm.  Pulmonary:     Effort: Pulmonary effort is normal.     Breath sounds: Normal breath sounds. No wheezing or rales.  Musculoskeletal:     Comments: Paraspinal musculature of the lumbar spine tender to palpation.  Neurological:     Mental Status: She is alert.     Lab Results  Component Value Date   WBC 7.3 12/31/2023   HGB 13.0 12/31/2023   HCT 38.4 12/31/2023   PLT 185 12/31/2023   GLUCOSE 87 12/31/2023   ALT 11 12/31/2023   AST 13 (L) 12/31/2023   NA 141 12/31/2023   K 4.2 12/31/2023   CL 105 12/31/2023   CREATININE 1.11 (H) 12/31/2023   BUN 25 (H) 12/31/2023   CO2 31 12/31/2023   TSH 1.080 10/26/2023   INR 1.0 03/15/2023     Assessment & Plan:  Back pain of thoracolumbar region Assessment & Plan: Recommend heat and tylenol . Referring to PT.   Orders: -     POCT urinalysis dipstick -     Ambulatory referral to Physical Therapy  Pyuria Assessment & Plan: UA with moderate leukocytes. Sending culture.  Orders: -     Urine Culture    Jacqulyn Ahle DO [facility]

## 2024-01-12 ENCOUNTER — Encounter: Payer: Self-pay | Admitting: Physician Assistant

## 2024-01-13 ENCOUNTER — Telehealth: Payer: Self-pay

## 2024-01-13 ENCOUNTER — Ambulatory Visit: Payer: Self-pay | Admitting: Family Medicine

## 2024-01-13 LAB — URINE CULTURE

## 2024-01-13 NOTE — Telephone Encounter (Signed)
 Spoke with patient this morning regarding ongoing back pain over the past two months. Patient reports pain in the middle of her back where the spine is. She took 500 mg of Tylenol  last night with barely any relief and applied ice to the area, which provided some temporary relief.  Patient shared that her chiropractor previously suggested the pain was muscular, but now suspects it may be kidney-related. Patient was seen by her PCP on 01/11/24 for the same complaint. PCP recommended physical therapy (referral made), Tylenol  and Voltaren gel as needed, and use of a heating pad. A urinalysis was also ordered at that time, with no evidence of UTI per PCP.  Patient was informed that her concerns and request for MRI will be relayed to Dr. Sherrod for review, and she will receive a follow-up call.

## 2024-01-14 ENCOUNTER — Other Ambulatory Visit: Payer: Self-pay | Admitting: Internal Medicine

## 2024-01-14 ENCOUNTER — Other Ambulatory Visit: Payer: Self-pay | Admitting: Medical Oncology

## 2024-01-14 DIAGNOSIS — C3491 Malignant neoplasm of unspecified part of right bronchus or lung: Secondary | ICD-10-CM

## 2024-01-14 DIAGNOSIS — M546 Pain in thoracic spine: Secondary | ICD-10-CM

## 2024-01-15 ENCOUNTER — Other Ambulatory Visit: Payer: Self-pay | Admitting: Physician Assistant

## 2024-01-15 ENCOUNTER — Telehealth: Payer: Self-pay | Admitting: Medical Oncology

## 2024-01-15 DIAGNOSIS — F419 Anxiety disorder, unspecified: Secondary | ICD-10-CM

## 2024-01-15 MED ORDER — LORAZEPAM 0.5 MG PO TABS: ORAL_TABLET | ORAL | 0 refills | Status: DC
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
## 2024-01-15 NOTE — Telephone Encounter (Signed)
 Request anti anxiety med pre MRI thoracic spine ( appt is 01/29/24).  Hands are bothering me especially the left hand is tight and stiff x 1 month. She can grip steering wheel with out problems. She can take off her wedding band without problems. I suggested she see a hand surgeon she saw in the past.

## 2024-01-18 ENCOUNTER — Encounter: Payer: Self-pay | Admitting: Physician Assistant

## 2024-01-18 ENCOUNTER — Ambulatory Visit: Admitting: Physical Therapy

## 2024-01-20 ENCOUNTER — Inpatient Hospital Stay: Attending: Internal Medicine | Admitting: Nurse Practitioner

## 2024-01-20 DIAGNOSIS — Z515 Encounter for palliative care: Secondary | ICD-10-CM | POA: Diagnosis not present

## 2024-01-20 DIAGNOSIS — R530 Neoplastic (malignant) related fatigue: Secondary | ICD-10-CM | POA: Diagnosis not present

## 2024-01-20 DIAGNOSIS — M545 Low back pain, unspecified: Secondary | ICD-10-CM | POA: Diagnosis not present

## 2024-01-20 DIAGNOSIS — C3491 Malignant neoplasm of unspecified part of right bronchus or lung: Secondary | ICD-10-CM

## 2024-01-21 ENCOUNTER — Encounter: Payer: Self-pay | Admitting: Nurse Practitioner

## 2024-01-21 NOTE — Progress Notes (Signed)
 Palliative Medicine [facility]  [phone] [phone]   Name: Yvette Curry Date: 01/21/2024 MRN: [id number]  DOB: [birth year]  Patient Care Team: Cook, [name], DO as PCP - General (Family Medicine) Mallipeddi, Diannah SQUIBB, MD as PCP - Cardiology (Cardiology) Carolee Sherwood JONETTA DOUGLAS, MD as Consulting Physician (Urology) Pickenpack-Cousar, Fannie SAILOR, NP as Nurse Practitioner (Hospice and Palliative Medicine) Rudy Josette RAMAN, PA-C as Physician Assistant (Gastroenterology) Pickenpack-Cousar, Fannie SAILOR, NP as Nurse Practitioner (Hospice and Palliative Medicine) Heilingoetter, Calton CROME, PA-C as Physician Assistant (Physician Assistant) Valdemar Rogue, MD as Consulting Physician (Ophthalmology) Eartha Flavors, Toribio, MD as Consulting Physician (Gastroenterology) Serene Gaile ORN, MD as Consulting Physician (Vascular Surgery) Sherrod Sherrod, MD as Consulting Physician (Oncology)   I connected with Betta E Fayette on 01/21/24 at  4:15 PM EDT by telephone and verified that I am speaking with the correct person using two identifiers.   I discussed the limitations, risks, security and privacy concerns of performing an evaluation and management service by telemedicine and the availability of in-person appointments. I also discussed with the patient that there may be a patient responsible charge related to this service. The patient expressed understanding and agreed to proceed.   Other persons participating in the visit and their role in the encounter: Husband   Patient's location: Home  Provider's location: [facility] [place]   INTERVAL HISTORY: [name] is a 68 y.o. 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 Palliative ask to see for symptom management and goals of care.    SOCIAL HISTORY:     reports that she has been smoking cigarettes. She has a 18.5 pack-year smoking history. She has been exposed to tobacco smoke. She has never used smokeless tobacco. She reports that she does not drink alcohol  and does not use drugs.  ADVANCE DIRECTIVES:  None on file   CODE STATUS: Full code  PAST MEDICAL HISTORY: Past Medical History:  Diagnosis Date   Allergy    Anemia    Anxiety    no current tx   Asthma    Blood transfusion without reported diagnosis 02/12/2023   Cataract    CHF (congestive heart failure) (HCC)    Depression    no meds at present   Dyspnea    Family history of adverse reaction to anesthesia    pt states mom had allergic reaction to some unknown anesthesia   GERD (gastroesophageal reflux disease)    no tx since weight loss   Hypercholesteremia    Neuromuscular disorder (HCC)    Osteoarthritis    stage IV lung 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  MEDICATIONS:  Current Outpatient Medications  Medication Sig Dispense Refill   ascorbic acid  (VITAMIN C ) 500 MG tablet Take 1,000 mg by mouth daily.     Budeson-Glycopyrrol-Formoterol  (BREZTRI  AEROSPHERE) 160-9-4.8 MCG/ACT AERO Inhale 2 puffs into the lungs in the morning and at bedtime. 10.7 g 3   carboxymethylcellulose 1 % ophthalmic solution Apply 1-2 drops to eye as needed (dry eye).     Cholecalciferol (VITAMIN D ) 50 MCG (2000 UT) CAPS Take by mouth.     ferrous sulfate  324 MG TBEC Take 1 tablet (324 mg total) by mouth daily with breakfast. 30 tablet 4   Homeopathic Products (CHESTAL HONEY COUGH PO) Take  by mouth.     loratadine (CLARITIN) 10 MG tablet Take 10 mg by mouth daily.     LORazepam  (ATIVAN ) 0.5 MG tablet Take 1 tablet about 30-60 minutes before MRI 2 tablet 0   midodrine  (PROAMATINE ) 5 MG tablet Take 1 tablet (5 mg total) by mouth 3 (three) times daily as needed (If your Systolic Blood Pressure is  less than 90). 90 tablet 1   pantoprazole  (PROTONIX ) 40 MG tablet TAKE 1 TABLET BY MOUTH EVERY DAY BEFORE BREAKFAST 90 tablet 1   Probiotic Product (PROBIOTIC PO) Take 1 capsule by mouth daily.     promethazine -dextromethorphan (PROMETHAZINE -DM) 6.25-15 MG/5ML syrup Take 5 mLs by mouth 4 (four) times daily as needed for cough. 118 mL 0   zinc gluconate 50 MG tablet Take 100 mg by mouth daily.     No current facility-administered medications for this visit.    VITAL SIGNS: There were no vitals taken for this visit. There were no vitals filed for this visit.  Estimated body mass index is 29.66 kg/m as calculated from the following:   Height as of 01/11/24: 5' 1 (1.549 m).   Weight as of 01/11/24: 157 lb (71.2 kg).   PERFORMANCE STATUS (ECOG) : 1 - Symptomatic but completely ambulatory   IMPRESSION: Discussed the use of AI scribe software for clinical note transcription with the patient, who gave verbal consent to proceed.  History of Present Illness [name] is a 68 year old female with non-small cell lung cancer who I connected with by phone for follow-up. Her husband is present during the call. Appetite is good. Denies concerns for nausea, vomiting, constipation, or diarrhea.   She has been experiencing tingling and occasional swelling in her hands for the past several months. A CT scan was performed 9/4, and the patient recalls it mentioned something about the lumbar spine, possibly degenerative/osetoarthritis, but she was unsure of the details. Her sister, who has a history of follicular lymphoma, advised her to get an MRI due to similar symptoms her sister experienced and patient also concerned given the nature of her cancer. Per her oncology team she is scheduled for an MRI on October 10th. We discussed use of pain medication/muscle relaxant however she declines at this time. She prefers to minimize medication use and will continue with use of Tylenol  as needed. Francis knows to  contact office if symptoms worsen.   Patient shares she is experiencing low blood pressure, leading to episodes of lightheadedness, especially when lying down. She takes midodrine  if her blood pressure drops below 90, which she has done three times recently. She wears compression socks and consumes pre-packaged meals high in sodium to manage her symptoms.   All questions answered and support provided.   Assessment & Plan Surveillance following treatment for malignant neoplasm Undergoing surveillance post-treatment for malignant neoplasm with concerns about potential mutation or spread  Chronic low back pain with possible degenerative changes Chronic low back pain persisting for over two months, possibly related to degenerative changes in the lumbar spine. Previous CT scan indicated arthritis in the spine. Symptoms include stiffness and difficulty sleeping. Concerns about potential underlying issues, prompting an upcoming MRI for further evaluation. She is cautious about starting physical therapy or chiropractic treatment without ruling out other conditions. - Proceed with scheduled MRI to evaluate for underlying causes of back pain. - Avoid starting physical therapy or chiropractic treatment until MRI results are available. - Consider use of acetaminophen  for pain management as needed, though she  is hesitant to use medications.  Hypotension with intermittent lightheadedness Intermittent episodes of lightheadedness associated with low blood pressure. She has been using midodrine  as needed when blood pressure drops. Compression socks are being used to help manage symptoms. She is encouraged to increase salt intake to help maintain blood pressure. - Continue use of midodrine  as needed for low blood pressure as managed by primary medical team.  - Wear compression socks to aid in blood pressure management. - Increase dietary salt intake to help maintain blood pressure. - Monitor blood pressure  regularly and adjust midodrine  use accordingly.  Plan to follow-up with patient in 2-3 weeks. Sooner if needed.   Patient expressed understanding and was in agreement with this plan. She also understands that She can call the clinic at any time with any questions, concerns, or complaints.   Any controlled substances utilized were prescribed in the context of palliative care. PDMP has been reviewed.   I provided 45 minutes of non face-to-face telephone visit time during this encounter, and > 50% was spent counseling as documented under my assessment & plan. Visit consisted of counseling and education dealing with the complex and emotionally intense issues of symptom management and palliative care in the setting of serious and potentially life-threatening illness.  Levon Borer, AGPCNP-BC  Palliative Medicine Team/[facility]

## 2024-01-24 ENCOUNTER — Encounter (HOSPITAL_COMMUNITY): Payer: Self-pay

## 2024-01-25 ENCOUNTER — Encounter: Payer: Self-pay | Admitting: Physician Assistant
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
## 2024-01-28 ENCOUNTER — Encounter (INDEPENDENT_AMBULATORY_CARE_PROVIDER_SITE_OTHER): Payer: Self-pay | Admitting: Ophthalmology

## 2024-01-28 ENCOUNTER — Ambulatory Visit (INDEPENDENT_AMBULATORY_CARE_PROVIDER_SITE_OTHER): Admitting: Ophthalmology

## 2024-01-28 DIAGNOSIS — C3491 Malignant neoplasm of unspecified part of right bronchus or lung: Secondary | ICD-10-CM

## 2024-01-28 DIAGNOSIS — H35711 Central serous chorioretinopathy, right eye: Secondary | ICD-10-CM

## 2024-01-28 DIAGNOSIS — H353211 Exudative age-related macular degeneration, right eye, with active choroidal neovascularization: Secondary | ICD-10-CM | POA: Diagnosis not present

## 2024-01-28 DIAGNOSIS — Z961 Presence of intraocular lens: Secondary | ICD-10-CM

## 2024-01-28 DIAGNOSIS — H04123 Dry eye syndrome of bilateral lacrimal glands: Secondary | ICD-10-CM | POA: Diagnosis not present

## 2024-01-28 MED ORDER — FARICIMAB-SVOA 6 MG/0.05ML IZ SOLN
6.0000 mg | INTRAVITREAL | Status: AC | PRN
  Administered 2024-01-28: 6 mg via INTRAVITREAL

## 2024-01-29 ENCOUNTER — Ambulatory Visit (HOSPITAL_COMMUNITY)
Admission: RE | Admit: 2024-01-29 | Discharge: 2024-01-29 | Disposition: A | Discharge status: Home / Self Care | Source: Ambulatory Visit | Attending: Internal Medicine | Admitting: Internal Medicine

## 2024-01-29 DIAGNOSIS — M79604 Pain in right leg: Secondary | ICD-10-CM

## 2024-01-29 DIAGNOSIS — R29898 Other symptoms and signs involving the musculoskeletal system: Secondary | ICD-10-CM | POA: Diagnosis not present

## 2024-01-29 DIAGNOSIS — C3491 Malignant neoplasm of unspecified part of right bronchus or lung: Secondary | ICD-10-CM | POA: Insufficient documentation

## 2024-01-29 DIAGNOSIS — C3490 Malignant neoplasm of unspecified part of unspecified bronchus or lung: Secondary | ICD-10-CM

## 2024-01-29 DIAGNOSIS — M549 Dorsalgia, unspecified: Secondary | ICD-10-CM

## 2024-01-29 DIAGNOSIS — M79605 Pain in left leg: Secondary | ICD-10-CM | POA: Diagnosis not present

## 2024-01-29 MED ORDER — GADOBUTROL 1 MMOL/ML IV SOLN
7.0000 mL | Freq: Once | INTRAVENOUS | Status: AC | PRN
  Administered 2024-01-29: 7 mL via INTRAVENOUS

## 2024-01-29 MED ORDER — HEPARIN SOD (PORK) LOCK FLUSH 100 UNIT/ML IV SOLN
500.0000 [IU] | Freq: Once | INTRAVENOUS | Status: AC
  Administered 2024-01-29: 500 [IU] via INTRAVENOUS

## 2024-01-30 ENCOUNTER — Other Ambulatory Visit: Payer: Self-pay | Admitting: Family Medicine

## 2024-02-03 ENCOUNTER — Encounter (INDEPENDENT_AMBULATORY_CARE_PROVIDER_SITE_OTHER): Payer: Self-pay | Admitting: Gastroenterology

## 2024-02-08 ENCOUNTER — Encounter: Payer: Self-pay | Admitting: *Deleted

## 2024-02-08 ENCOUNTER — Ambulatory Visit: Admitting: Family Medicine

## 2024-02-08 ENCOUNTER — Ambulatory Visit: Payer: Self-pay

## 2024-02-08 VITALS — BP 131/88 | HR 91 | Temp 97.9°F | Ht 61.0 in | Wt 161.2 lb

## 2024-02-08 DIAGNOSIS — J441 Chronic obstructive pulmonary disease with (acute) exacerbation: Secondary | ICD-10-CM | POA: Insufficient documentation

## 2024-02-08 MED ORDER — ALBUTEROL SULFATE HFA 108 (90 BASE) MCG/ACT IN AERS: 1.0000 | INHALATION_SPRAY | Freq: Four times a day (QID) | RESPIRATORY_TRACT | 0 refills | Status: DC | PRN

## 2024-02-08 MED ORDER — AMOXICILLIN-POT CLAVULANATE 875-125 MG PO TABS: 1.0000 | ORAL_TABLET | Freq: Two times a day (BID) | ORAL | 0 refills | Status: DC

## 2024-02-08 NOTE — Telephone Encounter (Signed)
 FYI Only or Action Required?: FYI only for provider.  Patient was last seen in primary care on 01/11/2024 by Curry, Yvette G, DO.  Called Nurse Triage reporting Cough.  Symptoms began several days ago.  Interventions attempted: Prescription medications: inhaler.  Symptoms are: unchanged.  Triage Disposition: See HCP Within 4 Hours (Or PCP Triage)  Patient/caregiver understands and will follow disposition?: Yes, will follow disposition  Copied from CRM [id number]. Topic: Clinical - Red Word Triage >> Feb 08, 2024  8:25 AM Olam RAMAN wrote: Red Word that prompted transfer to Nurse Triage: Cancer pt and is having issues with breathing and coughing Reason for Disposition  [1] MILD difficulty breathing (e.g., minimal/no SOB at rest, SOB with walking, pulse < 100) AND [2] still present when not coughing  Answer Assessment - Initial Assessment Questions 1. ONSET: When did the cough begin?      Couple days 2. SEVERITY: How bad is the cough today?      mild 3. SPUTUM: Describe the color of your sputum (e.g., none, dry cough; clear, white, yellow, green)     yellow 4. HEMOPTYSIS: Are you coughing up any blood? If Yes, ask: How much? (e.g., flecks, streaks, tablespoons, etc.)     denies 5. DIFFICULTY BREATHING: Are you having difficulty breathing? If Yes, ask: How bad is it? (e.g., mild, moderate, severe)      minimal 6. FEVER: Do you have a fever? If Yes, ask: What is your temperature, how was it measured, and when did it start?     denies 7. CARDIAC HISTORY: Do you have any history of heart disease? (e.g., heart attack, congestive heart failure)      denies 8. LUNG HISTORY: Do you have any history of lung disease?  (e.g., pulmonary embolus, asthma, emphysema)     cancer 10. OTHER SYMPTOMS: Do you have any other symptoms? (e.g., runny nose, wheezing, chest pain)       Sore throat, tickle in throat, runny nose,  Protocols used: Cough - Acute Productive-A-AH

## 2024-02-08 NOTE — Assessment & Plan Note (Signed)
 History most consistent with COPD exacerbation.  Treating with Augmentin .  Albuterol  as directed.  No corticosteroids as patient does not tolerate.

## 2024-02-08 NOTE — Progress Notes (Signed)
 Subjective:  Patient ID: Yvette Curry, female    DOB: December 07, 1955  Age: 68 y.o. MRN: [id number]  CC:   Chief Complaint  Patient presents with   Cough    Pt. Has had a cough in Saturday evening.  Pt. Has tingling/tightness/wheezing in the chest area from the cough.  Pt. Has coughed up yellowish mucus at times and some clear No fever Pt. Mentioned being around mold from neighbors house and made her react. Pt. Does have Stage 4 cancer. Last treatment was August 21st.  Pt. Is sleeping/napping more than usual    HPI:  68 year old female with below mentioned medical problems presents for evaluation of the above.  Patient reports that she began to have a cough Saturday evening.  She has had chest tightness and wheezing. Also reports SOB.  She states that the cough has been productive.  No fever.  Cough worsened slightly.  Recently had suspected mold exposure.  She has been taking Mucinex without resolution.  She has also used her inhaler without resolution.    Patient Active Problem List   Diagnosis Date Noted   COPD exacerbation (HCC) 02/08/2024   Back pain of thoracolumbar region 01/11/2024   Lower extremity edema 05/21/2023   Gastroesophageal reflux disease 05/14/2023   Pericardial effusion 04/10/2023   Bilateral pleural effusion 03/30/2023   Encounter for antineoplastic immunotherapy 03/11/2023   CAD (coronary artery disease) 02/17/2023   Ascending aorta dilation 02/17/2023   Heart failure with improved ejection fraction (HFimpEF) (HCC) 07/01/2022   Aortic atherosclerosis 06/04/2022   COPD (chronic obstructive pulmonary disease) (HCC) 06/04/2022   Neutropenia 03/27/2022   Adenocarcinoma of right lung, stage 4 (HCC) 12/12/2021   Tobacco abuse 11/06/2014    Social Hx   Social History   Socioeconomic History   Marital status: Married    Spouse name: Emil   Number of children: Y   Years of education: Not on file   Highest education level: GED or equivalent   Occupational History   Occupation: Personnel officer: UNEMPLOYED  Tobacco Use   Smoking status: Every Day    Current packs/day: 0.50    Average packs/day: 0.5 packs/day for 37.0 years (18.5 ttl pk-yrs)    Types: Cigarettes    Passive exposure: Current   Smokeless tobacco: Never   Tobacco comments:    Smokes half a pack of cigarettes a day. 12/18/2022 Tay  Vaping Use   Vaping status: Former  Substance and Sexual Activity   Alcohol  use: No   Drug use: No   Sexual activity: Yes    Comment: hysterectomy  Other Topics Concern   Not on file  Social History Narrative   Not on file   Social Drivers of Health   Financial Resource Strain: Low Risk  (02/08/2024)   Overall Financial Resource Strain (CARDIA)    Difficulty of Paying Living Expenses: Not hard at all  Food Insecurity: No Food Insecurity (02/08/2024)   Hunger Vital Sign    Worried About Running Out of Food in the Last Year: Never true    Ran Out of Food in the Last Year: Never true  Transportation Needs: No Transportation Needs (02/08/2024)   PRAPARE - Administrator, Civil Service (Medical): No    Lack of Transportation (Non-Medical): No  Physical Activity: Insufficiently Active (02/08/2024)   Exercise Vital Sign    Days of Exercise per Week: 1 day    Minutes of Exercise per Session: 20 min  Stress: No Stress  Concern Present (02/08/2024)   [employer] of Occupational Health - Occupational Stress Questionnaire    Feeling of Stress: Not at all  Social Connections: Socially Integrated (02/08/2024)   Social Connection and Isolation Panel    Frequency of Communication with Friends and Family: More than three times a week    Frequency of Social Gatherings with Friends and Family: Three times a week    Attends Religious Services: More than 4 times per year    Active Member of Clubs or Organizations: Yes    Attends Banker Meetings: More than 4 times per year    Marital Status: Married     Review of Systems Per HPI  Objective:  BP 131/88 (BP Location: Right Arm)   Pulse 91   Temp 97.9 F (36.6 C)   Ht 5' 1 (1.549 m)   Wt 161 lb 4 oz (73.1 kg)   SpO2 98%   BMI 30.47 kg/m      02/08/2024    4:27 PM 02/08/2024    4:26 PM 01/11/2024    9:21 AM  BP/Weight  Systolic BP 131 154 130  Diastolic BP 88 84 84  Wt. (Lbs)  161.25 157  BMI  30.47 kg/m2 29.66 kg/m2    Physical Exam Vitals and nursing note reviewed.  Constitutional:      General: She is not in acute distress.    Appearance: Normal appearance.  HENT:     Head: Normocephalic and atraumatic.  Eyes:     General:        Right eye: No discharge.        Left eye: No discharge.     Conjunctiva/sclera: Conjunctivae normal.  Cardiovascular:     Rate and Rhythm: Normal rate and regular rhythm.  Pulmonary:     Effort: Pulmonary effort is normal.     Breath sounds: No wheezing.  Neurological:     Mental Status: She is alert.     Lab Results  Component Value Date   WBC 7.3 12/31/2023   HGB 13.0 12/31/2023   HCT 38.4 12/31/2023   PLT 185 12/31/2023   GLUCOSE 87 12/31/2023   ALT 11 12/31/2023   AST 13 (L) 12/31/2023   NA 141 12/31/2023   K 4.2 12/31/2023   CL 105 12/31/2023   CREATININE 1.11 (H) 12/31/2023   BUN 25 (H) 12/31/2023   CO2 31 12/31/2023   TSH 1.080 10/26/2023   INR 1.0 03/15/2023     Assessment & Plan:  COPD exacerbation (HCC) Assessment & Plan: History most consistent with COPD exacerbation.  Treating with Augmentin .  Albuterol  as directed.  No corticosteroids as patient does not tolerate.  Orders: -     Amoxicillin -Pot Clavulanate; Take 1 tablet by mouth 2 (two) times daily.  Dispense: 14 tablet; Refill: 0 -     Albuterol  Sulfate HFA; Inhale 1-2 puffs into the lungs every 6 (six) hours as needed for wheezing or shortness of breath.  Dispense: 8 g; Refill: 0    Follow-up:  Return if symptoms worsen or fail to improve.  Jacqulyn Ahle DO [facility]

## 2024-02-08 NOTE — Patient Instructions (Signed)
 Medication as prescribed.  Message with concerns.

## 2024-02-08 NOTE — Telephone Encounter (Signed)
 Appointment scheduled.

## 2024-02-11 ENCOUNTER — Other Ambulatory Visit: Payer: Self-pay | Admitting: Family Medicine

## 2024-02-11 ENCOUNTER — Inpatient Hospital Stay

## 2024-02-11 DIAGNOSIS — J441 Chronic obstructive pulmonary disease with (acute) exacerbation: Secondary | ICD-10-CM

## 2024-02-11 DIAGNOSIS — R051 Acute cough: Secondary | ICD-10-CM

## 2024-02-11 MED ORDER — PROMETHAZINE-DM 6.25-15 MG/5ML PO SYRP: 5.0000 mL | ORAL_SOLUTION | Freq: Four times a day (QID) | ORAL | 0 refills | Status: DC | PRN

## 2024-02-16 ENCOUNTER — Ambulatory Visit (HOSPITAL_COMMUNITY)
Admission: RE | Admit: 2024-02-16 | Discharge: 2024-02-16 | Disposition: A | Discharge status: Home / Self Care | Source: Ambulatory Visit | Attending: Family Medicine | Admitting: Family Medicine

## 2024-02-16 ENCOUNTER — Ambulatory Visit: Payer: Self-pay | Admitting: Family Medicine

## 2024-02-16 ENCOUNTER — Encounter: Payer: Self-pay | Admitting: Family Medicine

## 2024-02-16 ENCOUNTER — Ambulatory Visit: Admitting: Family Medicine

## 2024-02-16 VITALS — BP 130/78 | HR 94 | Ht 61.0 in | Wt 158.0 lb

## 2024-02-16 DIAGNOSIS — J441 Chronic obstructive pulmonary disease with (acute) exacerbation: Secondary | ICD-10-CM | POA: Insufficient documentation

## 2024-02-16 MED ORDER — HYDROCODONE BIT-HOMATROP MBR 5-1.5 MG/5ML PO SOLN: 5.0000 mL | Freq: Three times a day (TID) | ORAL | 0 refills | Status: DC | PRN

## 2024-02-16 NOTE — Progress Notes (Signed)
 Subjective:  Patient ID: Yvette Curry, female    DOB: 10-09-1955  Age: 68 y.o. MRN: [id number]  CC:   Chief Complaint  Patient presents with   COPD    One week follow up ,cough will go on and on, wheezing at night, thick mucus   Back Pain    Back pain , concerned for pulled muscle due to coughing so much     HPI:  68 year old female presents for follow up.  Treated for COPD exacerbation on 10/20. Still having cough. Reports associated back pain from the cough. No fever. Has completed antibiotic.  Patient Active Problem List   Diagnosis Date Noted   COPD exacerbation (HCC) 02/16/2024   Back pain of thoracolumbar region 01/11/2024   Lower extremity edema 05/21/2023   Gastroesophageal reflux disease 05/14/2023   Pericardial effusion 04/10/2023   Bilateral pleural effusion 03/30/2023   Encounter for antineoplastic immunotherapy 03/11/2023   CAD (coronary artery disease) 02/17/2023   Ascending aorta dilation 02/17/2023   Heart failure with improved ejection fraction (HFimpEF) (HCC) 07/01/2022   Aortic atherosclerosis 06/04/2022   COPD (chronic obstructive pulmonary disease) (HCC) 06/04/2022   Neutropenia 03/27/2022   Adenocarcinoma of right lung, stage 4 (HCC) 12/12/2021   Tobacco abuse 11/06/2014    Social Hx   Social History   Socioeconomic History   Marital status: Married    Spouse name: Emil   Number of children: Y   Years of education: Not on file   Highest education level: GED or equivalent  Occupational History   Occupation: Personnel Officer: UNEMPLOYED  Tobacco Use   Smoking status: Every Day    Current packs/day: 0.50    Average packs/day: 0.5 packs/day for 37.0 years (18.5 ttl pk-yrs)    Types: Cigarettes    Passive exposure: Current   Smokeless tobacco: Never   Tobacco comments:    Smokes half a pack of cigarettes a day. 12/18/2022 Tay  Vaping Use   Vaping status: Former  Substance and Sexual Activity   Alcohol  use: No   Drug use: No    Sexual activity: Yes    Comment: hysterectomy  Other Topics Concern   Not on file  Social History Narrative   Not on file   Social Drivers of Health   Financial Resource Strain: Low Risk  (02/08/2024)   Overall Financial Resource Strain (CARDIA)    Difficulty of Paying Living Expenses: Not hard at all  Food Insecurity: No Food Insecurity (02/08/2024)   Hunger Vital Sign    Worried About Running Out of Food in the Last Year: Never true    Ran Out of Food in the Last Year: Never true  Transportation Needs: No Transportation Needs (02/08/2024)   PRAPARE - Administrator, Civil Service (Medical): No    Lack of Transportation (Non-Medical): No  Physical Activity: Insufficiently Active (02/08/2024)   Exercise Vital Sign    Days of Exercise per Week: 1 day    Minutes of Exercise per Session: 20 min  Stress: No Stress Concern Present (02/08/2024)   [employer] of Occupational Health - Occupational Stress Questionnaire    Feeling of Stress: Not at all  Social Connections: Socially Integrated (02/08/2024)   Social Connection and Isolation Panel    Frequency of Communication with Friends and Family: More than three times a week    Frequency of Social Gatherings with Friends and Family: Three times a week    Attends Religious Services: More than  4 times per year    Active Member of Clubs or Organizations: Yes    Attends Banker Meetings: More than 4 times per year    Marital Status: Married    Review of Systems Per HPI  Objective:  BP 130/78   Pulse 94   Ht 5' 1 (1.549 m)   Wt 158 lb (71.7 kg)   SpO2 93%   BMI 29.85 kg/m      02/16/2024    2:18 PM 02/16/2024    1:33 PM 02/08/2024    4:27 PM  BP/Weight  Systolic BP 130 160 131  Diastolic BP 78 92 88  Wt. (Lbs)  158   BMI  29.85 kg/m2     Physical Exam Vitals and nursing note reviewed.  Constitutional:      General: She is not in acute distress. HENT:     Head: Normocephalic and  atraumatic.  Cardiovascular:     Rate and Rhythm: Normal rate and regular rhythm.  Pulmonary:     Effort: Pulmonary effort is normal.     Breath sounds: No wheezing or rales.  Neurological:     Mental Status: She is alert.     Lab Results  Component Value Date   WBC 7.3 12/31/2023   HGB 13.0 12/31/2023   HCT 38.4 12/31/2023   PLT 185 12/31/2023   GLUCOSE 87 12/31/2023   ALT 11 12/31/2023   AST 13 (L) 12/31/2023   NA 141 12/31/2023   K 4.2 12/31/2023   CL 105 12/31/2023   CREATININE 1.11 (H) 12/31/2023   BUN 25 (H) 12/31/2023   CO2 31 12/31/2023   TSH 1.080 10/26/2023   INR 1.0 03/15/2023     Assessment & Plan:  COPD exacerbation (HCC) Assessment & Plan: Xray was obtained and was negative for pneumonia. Advised to use Breztri  twice a day as directed. Sending in another cough medication.  Orders: -     DG Chest 2 View  Other orders -     HYDROcodone  Bit-Homatrop MBr; Take 5 mLs by mouth every 8 (eight) hours as needed for cough.  Dispense: 120 mL; Refill: 0    Follow-up:  Return if symptoms worsen or fail to improve.  Jacqulyn Ahle DO [facility]

## 2024-02-16 NOTE — Assessment & Plan Note (Signed)
 Xray was obtained and was negative for pneumonia. Advised to use Breztri  twice a day as directed. Sending in another cough medication.

## 2024-02-16 NOTE — Patient Instructions (Signed)
 X-ray at the hospital.  I will call with results.

## 2024-02-17 ENCOUNTER — Other Ambulatory Visit: Payer: Self-pay | Admitting: Family Medicine

## 2024-02-17 ENCOUNTER — Telehealth: Payer: Self-pay | Admitting: Nurse Practitioner

## 2024-02-17 MED ORDER — HYDROCOD POLI-CHLORPHE POLI ER 10-8 MG/5ML PO SUER: 5.0000 mL | Freq: Two times a day (BID) | ORAL | 0 refills | Status: DC | PRN

## 2024-02-17 NOTE — Telephone Encounter (Signed)
 I spoke with patient regarding scheduled appointment with palliative care in November 2025. Patient aware of time and date.
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
## 2024-02-26 ENCOUNTER — Ambulatory Visit (INDEPENDENT_AMBULATORY_CARE_PROVIDER_SITE_OTHER): Admitting: Ophthalmology

## 2024-02-26 ENCOUNTER — Encounter (INDEPENDENT_AMBULATORY_CARE_PROVIDER_SITE_OTHER): Payer: Self-pay | Admitting: Ophthalmology

## 2024-02-26 DIAGNOSIS — H35711 Central serous chorioretinopathy, right eye: Secondary | ICD-10-CM

## 2024-02-26 DIAGNOSIS — H04123 Dry eye syndrome of bilateral lacrimal glands: Secondary | ICD-10-CM

## 2024-02-26 DIAGNOSIS — H353211 Exudative age-related macular degeneration, right eye, with active choroidal neovascularization: Secondary | ICD-10-CM

## 2024-02-26 DIAGNOSIS — Z961 Presence of intraocular lens: Secondary | ICD-10-CM

## 2024-02-26 DIAGNOSIS — C3491 Malignant neoplasm of unspecified part of right bronchus or lung: Secondary | ICD-10-CM

## 2024-02-26 MED ORDER — FARICIMAB-SVOA 6 MG/0.05ML IZ SOLN
6.0000 mg | INTRAVITREAL | Status: AC | PRN
  Administered 2024-02-26: 6 mg via INTRAVITREAL

## 2024-03-03 ENCOUNTER — Encounter: Payer: Self-pay | Admitting: Nurse Practitioner

## 2024-03-03 ENCOUNTER — Inpatient Hospital Stay: Attending: Internal Medicine

## 2024-03-03 VITALS — BP 134/84 | HR 87 | Temp 98.1°F | Resp 17 | Ht 61.0 in | Wt 165.3 lb

## 2024-03-03 DIAGNOSIS — R530 Neoplastic (malignant) related fatigue: Secondary | ICD-10-CM

## 2024-03-03 DIAGNOSIS — Z515 Encounter for palliative care: Secondary | ICD-10-CM

## 2024-03-03 DIAGNOSIS — R21 Rash and other nonspecific skin eruption: Secondary | ICD-10-CM

## 2024-03-03 DIAGNOSIS — C3491 Malignant neoplasm of unspecified part of right bronchus or lung: Secondary | ICD-10-CM

## 2024-03-03 MED ORDER — TRIAMCINOLONE ACETONIDE 0.5 % EX OINT: 1.0000 | TOPICAL_OINTMENT | Freq: Two times a day (BID) | CUTANEOUS | 0 refills | Status: AC

## 2024-03-03 NOTE — Progress Notes (Signed)
 Palliative Medicine [facility]  [phone] [phone]   Name: Yvette Curry Date: 03/03/2024 MRN: [id number]  DOB: 04-13-1956  Patient Care Team: Cook, [name], DO as PCP - General (Family Medicine) Mallipeddi, Diannah SQUIBB, MD as PCP - Cardiology (Cardiology) Carolee Sherwood JONETTA DOUGLAS, MD as Consulting Physician (Urology) Pickenpack-Cousar, Fannie SAILOR, NP as Nurse Practitioner (Hospice and Palliative Medicine) Rudy Josette RAMAN, PA-C as Physician Assistant (Gastroenterology) Pickenpack-Cousar, Fannie SAILOR, NP as Nurse Practitioner (Hospice and Palliative Medicine) Heilingoetter, Calton CROME, PA-C as Physician Assistant (Physician Assistant) Valdemar Rogue, MD as Consulting Physician (Ophthalmology) Eartha Flavors, Toribio, MD as Consulting Physician (Gastroenterology) Serene Gaile ORN, MD as Consulting Physician (Vascular Surgery) Sherrod Sherrod, MD as Consulting Physician (Oncology)   INTERVAL HISTORY: Yvette Curry is a 68 y.o. 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 Palliative ask to see for symptom management and goals of care.   SOCIAL HISTORY:     reports that she has been smoking cigarettes. She has a 18.5 pack-year smoking history. She has been exposed to tobacco smoke. She has never used smokeless tobacco. She reports that she does not drink alcohol  and does not use drugs.  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  MEDICATIONS:  Current Outpatient Medications  Medication Sig Dispense Refill   triamcinolone  ointment (KENALOG ) 0.5 % Apply 1 Application topically 2 (two) times daily. 30 g 0   albuterol  (VENTOLIN  HFA) 108 (90 Base) MCG/ACT inhaler Inhale 1-2 puffs into the lungs every 6 (six) hours as needed for wheezing or shortness of breath. 8 g 0   amoxicillin -clavulanate (AUGMENTIN ) 875-125 MG tablet Take 1 tablet by mouth 2 (two) times daily. 14 tablet 0   ascorbic acid  (VITAMIN C ) 500 MG tablet Take 1,000 mg by mouth daily.     Budeson-Glycopyrrol-Formoterol  (BREZTRI  AEROSPHERE) 160-9-4.8 MCG/ACT AERO Inhale 2 puffs into the lungs in the morning and at bedtime. 10.7 g 3   carboxymethylcellulose 1 % ophthalmic solution Apply 1-2 drops to eye as needed (dry eye).     chlorpheniramine-HYDROcodone  (TUSSIONEX) 10-8 MG/5ML Take 5 mLs by mouth every 12 (twelve) hours as needed for cough. 115 mL 0   Cholecalciferol (VITAMIN D ) 50 MCG (2000 UT) CAPS Take by mouth.     ferrous sulfate  324 MG TBEC Take 1 tablet (324 mg total) by mouth daily with breakfast. 30 tablet 4   Homeopathic Products (CHESTAL HONEY COUGH PO) Take by mouth.     HYDROcodone  bit-homatropine (HYCODAN) 5-1.5 MG/5ML syrup Take 5 mLs by  mouth every 8 (eight) hours as needed for cough. 120 mL 0   loratadine (CLARITIN) 10 MG tablet Take 10 mg by mouth daily.     LORazepam  (ATIVAN ) 0.5 MG tablet Take 1 tablet about 30-60 minutes before MRI 2 tablet 0   midodrine  (PROAMATINE ) 5 MG tablet Take 1 tablet (5 mg total) by mouth 3 (three) times daily as needed (If your Systolic Blood Pressure is less than 90).  90 tablet 1   pantoprazole  (PROTONIX ) 40 MG tablet TAKE 1 TABLET BY MOUTH EVERY DAY BEFORE BREAKFAST 90 tablet 1   Probiotic Product (PROBIOTIC PO) Take 1 capsule by mouth daily.     promethazine -dextromethorphan (PROMETHAZINE -DM) 6.25-15 MG/5ML syrup Take 5 mLs by mouth 4 (four) times daily as needed for cough. 118 mL 0   Turmeric (QC TUMERIC COMPLEX PO) Take 500 mg by mouth.     zinc gluconate 50 MG tablet Take 100 mg by mouth daily.     No current facility-administered medications for this visit.    VITAL SIGNS: BP 134/84 (BP Location: Right Arm, Patient Position: Sitting)   Pulse 87   Temp 98.1 F (36.7 C) (Temporal)   Resp 17   Ht 5' 1 (1.549 m)   Wt 165 lb 4.8 oz (75 kg)   SpO2 98%   BMI 31.23 kg/m  Filed Weights   03/03/24 0925  Weight: 165 lb 4.8 oz (75 kg)    Estimated body mass index is 31.23 kg/m as calculated from the following:   Height as of this encounter: 5' 1 (1.549 m).   Weight as of this encounter: 165 lb 4.8 oz (75 kg).   PERFORMANCE STATUS (ECOG) : 1 - Symptomatic but completely ambulatory  Assessment NAD RRR Normal breathing pattern  AAO x3  IMPRESSION: Discussed the use of AI scribe software for clinical note transcription with the patient, who gave verbal consent to proceed.  History of Present Illness Yvette Curry is a 68 year old female with non-small cell lung cancer who presented to the clinic for follow-up. She is accompanied by her husband. No acute distress. Denies nausea, vomiting, constipation, or diarrhea. She is planning to travel to [place], [place]  to visit family, including a grandson and her sister who has cancer.  She has been experiencing a persistent cough despite completing a course of Augmentin  for an upper respiratory infection. A chest x-ray showed clear lungs. Initially, she used a hydrocodone -containing cough syrup, which she discontinued after developing a skin rash. Currently, she uses a natural cough syrup with  honey (Chestal), taking 10 mL doses, which provides some relief, and Mucinex, though the cough persists.  After starting the hydrocodone  cough syrup, she developed a red, irritated, and bumpy rash on her abdomen and buttocks. The rash is not itchy but is bothersome. She suspects an allergy to the medication, recalling a possible past allergy to codeine. We discussed ways to manage rash to include Desitin to sacral redness area. Education provided on use of topical creams and steroids versus oral given allergy to oral prednisone. Patient and husband verbalized understanding.   Mrs. Munday experiences sleep disturbances, often sleeping excessively during the day and having difficulty sleeping at night. She sometimes goes to bed as early as 5:30 PM and wakes up around 1 PM the next day. She has not used melatonin recently but has it available at home. We discussed use of melatonin for sleep in addition to sleep hygiene. We also discussed increasing activity including walking during the day.  All questions answered and support provided.   Assessment & Plan Surveillance following treatment for malignant neoplasm Undergoing surveillance post-treatment for malignant neoplasm with concerns about potential mutation or spread   Generalized drug eruption due to hydrocodone -containing cough syrup Generalized drug eruption likely due to hydrocodone -containing cough syrup, presenting as red, irritated skin with bumps on the back and buttocks. No itching reported. Rash developed after starting the cough syrup. Hydrocodone  is suspected as the allergen, as both cough syrups contain it. - Discontinued hydrocodone -containing cough syrup. - Prescribed steroid cream (Triamcinolone ) for rash management. - Recommended over-the-counter hydrocortisone cream as an initial trial. - Advised use of Desitin for buttock area to protect skin. - Instructed to monitor rash and report if hydrocortisone is  ineffective.  Persistent cough following upper respiratory infection Persistent cough following upper respiratory infection. Previous treatment with hydrocodone -containing cough syrup was discontinued due to suspected allergic reaction. Currently using Chest Stool cough syrup, which is helping but not fully resolving the cough. - Continue Chestal cough syrup as needed. - Consider Mucinex to help break up congestion. - Use tea and honey for symptomatic relief.  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 Difficulty falling asleep at night and excessive daytime sleepiness. Symptoms may be related to the body's adjustment post-immunotherapy treatment, which was discontinued in August. No recent changes in activity level or stressors identified. - Consider melatonin 2-5 mg 30-45 minutes before desired sleep time. - Monitor sleep patterns and report any consistent issues.  Vulvovaginal irritation and odor post-hysterectomy Vulvovaginal irritation and odor post-hysterectomy. Symptoms include odor and irritation in the vaginal area, possibly due to yeast infection or irritation from surgical site. - Use over-the-counter Monistat for yeast infection. - Maintain cleanliness in the area to prevent irritation.  I will plan to see patient back in 4-6 weeks. Sooner if needed.   Patient expressed understanding and was in agreement with this plan. She also understands that She can call the clinic at any time with any questions, concerns, or complaints.   Any controlled substances utilized were prescribed in the context of palliative care. PDMP has been reviewed.   Visit consisted of counseling and education dealing with the complex and emotionally intense issues of symptom management and palliative care in the setting of serious and potentially life-threatening illness.  Levon Borer, AGPCNP-BC  Palliative Medicine  Team/[facility]

## 2024-03-09 ENCOUNTER — Telehealth: Payer: Self-pay

## 2024-03-09 NOTE — Telephone Encounter (Signed)
 Spoke with patient regarding her use of Meloxicam. Patient reported that she has been experiencing pain in her hands and that her doctor recommended the medication. Informed Cassie, PA, who stated that it is acceptable for the patient to take Meloxicam. Patient voiced understanding.
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
## 2024-03-24 ENCOUNTER — Inpatient Hospital Stay

## 2024-03-25 ENCOUNTER — Ambulatory Visit (INDEPENDENT_AMBULATORY_CARE_PROVIDER_SITE_OTHER): Admitting: Ophthalmology

## 2024-03-25 ENCOUNTER — Encounter (INDEPENDENT_AMBULATORY_CARE_PROVIDER_SITE_OTHER): Payer: Self-pay | Admitting: Ophthalmology

## 2024-03-25 DIAGNOSIS — H353211 Exudative age-related macular degeneration, right eye, with active choroidal neovascularization: Secondary | ICD-10-CM

## 2024-03-25 DIAGNOSIS — H04123 Dry eye syndrome of bilateral lacrimal glands: Secondary | ICD-10-CM

## 2024-03-25 DIAGNOSIS — H35711 Central serous chorioretinopathy, right eye: Secondary | ICD-10-CM

## 2024-03-25 DIAGNOSIS — Z961 Presence of intraocular lens: Secondary | ICD-10-CM

## 2024-03-25 DIAGNOSIS — C3491 Malignant neoplasm of unspecified part of right bronchus or lung: Secondary | ICD-10-CM

## 2024-03-25 MED ORDER — FARICIMAB-SVOA 6 MG/0.05ML IZ SOLN
6.0000 mg | INTRAVITREAL | Status: AC | PRN
  Administered 2024-03-25: 6 mg via INTRAVITREAL

## 2024-03-31 ENCOUNTER — Inpatient Hospital Stay: Attending: Internal Medicine

## 2024-03-31 ENCOUNTER — Ambulatory Visit (HOSPITAL_COMMUNITY)
Admission: RE | Admit: 2024-03-31 | Discharge: 2024-03-31 | Disposition: A | Discharge status: Home / Self Care | Source: Ambulatory Visit | Attending: Physician Assistant | Admitting: Physician Assistant

## 2024-03-31 ENCOUNTER — Other Ambulatory Visit: Payer: Self-pay | Admitting: Physician Assistant

## 2024-03-31 DIAGNOSIS — C3411 Malignant neoplasm of upper lobe, right bronchus or lung: Secondary | ICD-10-CM | POA: Insufficient documentation

## 2024-03-31 DIAGNOSIS — Z885 Allergy status to narcotic agent status: Secondary | ICD-10-CM | POA: Insufficient documentation

## 2024-03-31 DIAGNOSIS — Z888 Allergy status to other drugs, medicaments and biological substances status: Secondary | ICD-10-CM | POA: Insufficient documentation

## 2024-03-31 DIAGNOSIS — C3491 Malignant neoplasm of unspecified part of right bronchus or lung: Secondary | ICD-10-CM

## 2024-03-31 DIAGNOSIS — Z79899 Other long term (current) drug therapy: Secondary | ICD-10-CM | POA: Insufficient documentation

## 2024-03-31 DIAGNOSIS — Z882 Allergy status to sulfonamides status: Secondary | ICD-10-CM | POA: Insufficient documentation

## 2024-03-31 DIAGNOSIS — F17210 Nicotine dependence, cigarettes, uncomplicated: Secondary | ICD-10-CM | POA: Insufficient documentation

## 2024-03-31 DIAGNOSIS — B372 Candidiasis of skin and nail: Secondary | ICD-10-CM | POA: Diagnosis not present

## 2024-03-31 DIAGNOSIS — C778 Secondary and unspecified malignant neoplasm of lymph nodes of multiple regions: Secondary | ICD-10-CM | POA: Diagnosis present

## 2024-03-31 LAB — CBC WITH DIFFERENTIAL (CANCER CENTER ONLY)
Abs Immature Granulocytes: 0.02 K/uL (ref 0.00–0.07)
Basophils Absolute: 0.1 K/uL (ref 0.0–0.1)
Basophils Relative: 1 %
Eosinophils Absolute: 0.2 K/uL (ref 0.0–0.5)
Eosinophils Relative: 3 %
HCT: 37.6 % (ref 36.0–46.0)
Hemoglobin: 12.7 g/dL (ref 12.0–15.0)
Immature Granulocytes: 0 %
Lymphocytes Relative: 25 %
Lymphs Abs: 1.7 K/uL (ref 0.7–4.0)
MCH: 32.4 pg (ref 26.0–34.0)
MCHC: 33.8 g/dL (ref 30.0–36.0)
MCV: 95.9 fL (ref 80.0–100.0)
Monocytes Absolute: 0.6 K/uL (ref 0.1–1.0)
Monocytes Relative: 9 %
Neutro Abs: 4.2 K/uL (ref 1.7–7.7)
Neutrophils Relative %: 62 %
Platelet Count: 215 K/uL (ref 150–400)
RBC: 3.92 MIL/uL (ref 3.87–5.11)
RDW: 14 % (ref 11.5–15.5)
WBC Count: 6.8 K/uL (ref 4.0–10.5)
nRBC: 0 % (ref 0.0–0.2)

## 2024-03-31 LAB — CMP (CANCER CENTER ONLY)
ALT: 19 U/L (ref 0–44)
AST: 21 U/L (ref 15–41)
Albumin: 4 g/dL (ref 3.5–5.0)
Alkaline Phosphatase: 75 U/L (ref 38–126)
Anion gap: 8 (ref 5–15)
BUN: 17 mg/dL (ref 8–23)
CO2: 28 mmol/L (ref 22–32)
Calcium: 10.3 mg/dL (ref 8.9–10.3)
Chloride: 106 mmol/L (ref 98–111)
Creatinine: 1.07 mg/dL — ABNORMAL HIGH (ref 0.44–1.00)
GFR, Estimated: 57 mL/min — ABNORMAL LOW (ref >60)
Glucose, Bld: 108 mg/dL — ABNORMAL HIGH (ref 70–99)
Potassium: 4.3 mmol/L (ref 3.5–5.1)
Sodium: 142 mmol/L (ref 135–145)
Total Bilirubin: 0.2 mg/dL (ref 0.0–1.2)
Total Protein: 6.8 g/dL (ref 6.5–8.1)

## 2024-03-31 MED ORDER — IOHEXOL 300 MG/ML  SOLN
100.0000 mL | Freq: Once | INTRAMUSCULAR | Status: AC | PRN
  Administered 2024-03-31: 100 mL via INTRAVENOUS

## 2024-03-31 MED ORDER — HEPARIN SOD (PORK) LOCK FLUSH 100 UNIT/ML IV SOLN
INTRAVENOUS | Status: AC
  Filled 2024-03-31: qty 5

## 2024-03-31 MED ORDER — SODIUM CHLORIDE (PF) 0.9 % IJ SOLN
INTRAMUSCULAR | Status: AC
  Filled 2024-03-31: qty 50

## 2024-03-31 MED ORDER — HEPARIN SOD (PORK) LOCK FLUSH 100 UNIT/ML IV SOLN
500.0000 [IU] | Freq: Once | INTRAVENOUS | Status: AC
  Administered 2024-03-31: 500 [IU] via INTRAVENOUS
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
## 2024-04-04 ENCOUNTER — Encounter (HOSPITAL_BASED_OUTPATIENT_CLINIC_OR_DEPARTMENT_OTHER): Payer: Self-pay

## 2024-04-04 ENCOUNTER — Encounter: Payer: Self-pay | Admitting: Nurse Practitioner

## 2024-04-04 ENCOUNTER — Inpatient Hospital Stay: Admitting: Physician Assistant

## 2024-04-04 ENCOUNTER — Telehealth: Payer: Self-pay

## 2024-04-04 ENCOUNTER — Inpatient Hospital Stay: Admitting: Nurse Practitioner

## 2024-04-04 VITALS — BP 146/90 | HR 88 | Temp 97.9°F | Wt 168.0 lb

## 2024-04-04 DIAGNOSIS — Z515 Encounter for palliative care: Secondary | ICD-10-CM

## 2024-04-04 DIAGNOSIS — C3491 Malignant neoplasm of unspecified part of right bronchus or lung: Secondary | ICD-10-CM | POA: Diagnosis not present

## 2024-04-04 DIAGNOSIS — C3411 Malignant neoplasm of upper lobe, right bronchus or lung: Secondary | ICD-10-CM | POA: Diagnosis not present

## 2024-04-04 DIAGNOSIS — L304 Erythema intertrigo: Secondary | ICD-10-CM

## 2024-04-04 DIAGNOSIS — B372 Candidiasis of skin and nail: Secondary | ICD-10-CM | POA: Diagnosis not present

## 2024-04-04 DIAGNOSIS — F17210 Nicotine dependence, cigarettes, uncomplicated: Secondary | ICD-10-CM | POA: Diagnosis not present

## 2024-04-04 DIAGNOSIS — Z7189 Other specified counseling: Secondary | ICD-10-CM

## 2024-04-04 MED ORDER — NYSTATIN 100000 UNIT/GM EX POWD: 1.0000 | Freq: Three times a day (TID) | CUTANEOUS | 0 refills | Status: AC

## 2024-04-04 NOTE — Telephone Encounter (Signed)
 Patient called and states that she spoke with [facility] in regards to paperwork faxed to their office. Office is requesting last office notes, most recent labs and recent scans.  Patient informed me of fax number of [phone]. Fax confirmed.

## 2024-04-05 ENCOUNTER — Encounter: Payer: Self-pay | Admitting: Physician Assistant

## 2024-04-05 ENCOUNTER — Encounter: Payer: Self-pay | Admitting: Orthopedic Surgery

## 2024-04-05 ENCOUNTER — Telehealth: Payer: Self-pay | Admitting: Physician Assistant

## 2024-04-05 NOTE — Telephone Encounter (Signed)
 Scheduled patient for next appointments. Called and spoke with the patient, she is aware.

## 2024-04-05 NOTE — Progress Notes (Signed)
 Palliative Medicine [facility]  [phone] [phone]   Name: Yvette Curry Date: 04/05/2024 MRN: [id number]  DOB: 1956-03-26  Patient Care Team: [name], DO as PCP - General (Family Medicine) Mallipeddi, Diannah SQUIBB, MD as PCP - Cardiology (Cardiology) Carolee Sherwood JONETTA DOUGLAS, MD as Consulting Physician (Urology) Pickenpack-Cousar, Fannie SAILOR, NP as Nurse Practitioner (Hospice and Palliative Medicine) Rudy Josette RAMAN, PA-C as Physician Assistant (Gastroenterology) Pickenpack-Cousar, Fannie SAILOR, NP as Nurse Practitioner (Hospice and Palliative Medicine) Heilingoetter, Calton CROME, PA-C as Physician Assistant (Physician Assistant) Valdemar Rogue, MD as Consulting Physician (Ophthalmology) Eartha Flavors, Toribio, MD as Consulting Physician (Gastroenterology) Serene Gaile ORN, MD as Consulting Physician (Vascular Surgery) Sherrod Sherrod, MD as Consulting Physician (Oncology)   INTERVAL HISTORY: Yvette Curry is a 68 y.o. 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 Palliative ask to see for symptom management and goals of care.   SOCIAL HISTORY:     reports that she has been smoking cigarettes. She has a 18.5 pack-year smoking history. She has been exposed to tobacco smoke. She has never used smokeless tobacco. She reports that she does not drink alcohol  and does not use drugs.  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  MEDICATIONS:  Current Outpatient Medications  Medication Sig Dispense Refill   albuterol  (VENTOLIN  HFA) 108 (90 Base) MCG/ACT inhaler Inhale 1-2 puffs into the lungs every 6 (six) hours as needed for wheezing or shortness of breath. 8 g 0   amoxicillin -clavulanate (AUGMENTIN ) 875-125 MG tablet Take 1 tablet by mouth 2 (two) times daily. 14 tablet 0   ascorbic acid  (VITAMIN C ) 500 MG tablet Take 1,000 mg by mouth daily.     Budeson-Glycopyrrol-Formoterol  (BREZTRI  AEROSPHERE) 160-9-4.8 MCG/ACT AERO Inhale 2 puffs into the lungs in the morning and at bedtime. 10.7 g 3   carboxymethylcellulose 1 % ophthalmic solution Apply 1-2 drops to eye as needed (dry eye).     chlorpheniramine-HYDROcodone  (TUSSIONEX) 10-8 MG/5ML Take 5 mLs by mouth every 12 (twelve) hours as needed for cough. 115 mL 0   Cholecalciferol (VITAMIN D ) 50 MCG (2000 UT) CAPS Take by mouth.     ferrous sulfate  324 MG TBEC Take 1 tablet (324 mg total) by mouth daily with breakfast. 30 tablet 4   Homeopathic Products (CHESTAL HONEY COUGH PO) Take by mouth.     HYDROcodone  bit-homatropine (HYCODAN) 5-1.5 MG/5ML syrup Take 5 mLs by mouth every 8 (eight) hours as needed for cough. 120 mL 0   loratadine (CLARITIN) 10 MG  tablet Take 10 mg by mouth daily.     LORazepam  (ATIVAN ) 0.5 MG tablet Take 1 tablet about 30-60 minutes before MRI 2 tablet 0   midodrine  (PROAMATINE ) 5 MG tablet Take 1 tablet (5 mg total) by mouth 3 (three) times daily as needed (If your Systolic Blood Pressure is less than 90). 90 tablet 1   nystatin  (MYCOSTATIN /NYSTOP ) powder Apply 1 Application topically 3 (three) times  daily. 30 g 0   pantoprazole  (PROTONIX ) 40 MG tablet TAKE 1 TABLET BY MOUTH EVERY DAY BEFORE BREAKFAST 90 tablet 1   Probiotic Product (PROBIOTIC PO) Take 1 capsule by mouth daily.     promethazine -dextromethorphan (PROMETHAZINE -DM) 6.25-15 MG/5ML syrup Take 5 mLs by mouth 4 (four) times daily as needed for cough. 118 mL 0   triamcinolone  ointment (KENALOG ) 0.5 % Apply 1 Application topically 2 (two) times daily. 30 g 0   Turmeric (QC TUMERIC COMPLEX PO) Take 500 mg by mouth.     zinc gluconate 50 MG tablet Take 100 mg by mouth daily.     No current facility-administered medications for this visit.    VITAL SIGNS: There were no vitals taken for this visit. There were no vitals filed for this visit.   Estimated body mass index is 31.74 kg/m as calculated from the following:   Height as of 03/03/24: 5' 1 (1.549 m).   Weight as of an earlier encounter on 04/04/24: 168 lb (76.2 kg).   PERFORMANCE STATUS (ECOG) : 1 - Symptomatic but completely ambulatory  Assessment NAD RRR Normal breathing pattern  AAO x3  IMPRESSION: Discussed the use of AI scribe software for clinical note transcription with the patient, who gave verbal consent to proceed.  History of Present Illness Yvette Curry is a 68 year old female with non-small cell lung cancer who presented to the clinic for follow-up. She is accompanied by her husband. No acute distress. Denies nausea, vomiting, constipation, or diarrhea. Looking forward to spending upcoming holidays with her family.   She has ongoing skin issues, including rough spots on her stomach and back.These spots have been present since starting previously prescribed cough syrup, however rash remains persistent despite discontinued use. Previously discussed use of Miconazole (Monistat) for fungal use with hopes of improvement. Patient seen by Cassie, PA and prescription has been sent for Nystatin  powder. We discussed continued observation. If no improvement will  plan to consider further work-up or dermatology referral. Skin irritation most likely can be related to the aftereffects of cancer treatment.   We discussed challenges with sleep schedule. Mrs. Manders clarifies that previous notes inaccurately recorded her sleep schedule. Advised of correction.   Overall Mrs. Eichler is doing well. No uncontrolled symptoms. Remains under surveillance. We will continue to support and follow as needed.   All questions answered.   Assessment & Plan Surveillance following treatment for malignant neoplasm Undergoing surveillance post-treatment for malignant neoplasm with concerns about potential mutation or spread  Intertriginous candidiasis Persistent intertriginous candidiasis with rough spots in the groin area, possibly exacerbated by clothing. Differential includes potential aftereffects of cancer treatment or reaction to cough syrup. - Prescribed nystatin  cream application as prescribed by Cassie, PA.  - Monitor for improvement or persistence of symptoms. - Will consider dermatology referral if symptoms do not improve.  No uncontrolled symptoms.   I will plan to see patient back in 4-6 weeks. Sooner if needed.   Patient expressed understanding and was in agreement with this plan. She also understands that She can call  the clinic at any time with any questions, concerns, or complaints.   Any controlled substances utilized were prescribed in the context of palliative care. PDMP has been reviewed.   Visit consisted of counseling and education dealing with the complex and emotionally intense issues of symptom management and palliative care in the setting of serious and potentially life-threatening illness.  Levon Borer, AGPCNP-BC  Palliative Medicine Team/[facility]

## 2024-04-06 ENCOUNTER — Encounter: Payer: Self-pay | Admitting: Emergency Medicine

## 2024-04-06 ENCOUNTER — Ambulatory Visit: Admitting: Emergency Medicine

## 2024-04-06 VITALS — BP 143/90 | HR 89 | Temp 98.5°F | Ht 61.0 in | Wt 167.0 lb

## 2024-04-06 DIAGNOSIS — J439 Emphysema, unspecified: Secondary | ICD-10-CM

## 2024-04-06 DIAGNOSIS — R053 Chronic cough: Secondary | ICD-10-CM

## 2024-04-06 DIAGNOSIS — F17210 Nicotine dependence, cigarettes, uncomplicated: Secondary | ICD-10-CM

## 2024-04-06 DIAGNOSIS — C3491 Malignant neoplasm of unspecified part of right bronchus or lung: Secondary | ICD-10-CM

## 2024-04-06 DIAGNOSIS — Z720 Tobacco use: Secondary | ICD-10-CM

## 2024-04-06 NOTE — Assessment & Plan Note (Signed)
 Continues to smoke a 0.5 packs daily.  Counseled her on benefits of cessation and the relationship of this to her cough

## 2024-04-06 NOTE — Progress Notes (Signed)
 Subjective:    Patient ID: Yvette Curry, female    DOB: [birth year], 68 y.o.   MRN: [id number]  COPD Her past medical history is significant for COPD.   68 year old woman with a history of active tobacco use (>30 pk-yrs), COPD, stage IV adenocarcinoma of the right lung (to bilateral mediastinum, left axilla, left mesentery, effusions) previously followed by Dr. Brenna in our office and currently followed by oncology at [facility].  She is currently on Keytruda  (had been on Alimta  but discontinued due to recurrent cellulitis).  Her course has been significant for bilateral pleural effusions that improved on her treatment regimen, left axillary lymphadenopathy.  We have been managing her on Breztri , but she is not actually using it. She stopped it over a month ago. She hasn't seemed to miss it, wasn't taking reliably.  She has intermittent cough, had a spell of coughing and was treated for a possible bronchitis after a URI. Treated w promethazine  DM, treated w Augmentin . She is better, but still has the intermittent cough. Allergies are active, off loratadine right now. Smoking about 10 cig a day.   CT chest abdomen and pelvis 09/29/2023 reviewed by me showed unchanged left axillary lymphadenopathy 1.7 x 1.5 cm, moderate centrilobular emphysema with bilateral bronchial wall thickening, unchanged right upper lobe spiculated nodule 1.2 x 0.9 cm, resolved right pleural effusion.  No evidence of any metastatic disease or lymphadenopathy in the abdomen or pelvis.   ROV 04/06/2024 --follow-up visit 68 year old woman with history of tobacco use, stage IV adenocarcinoma of the right lung complicated by effusions.  Most recently on Keytruda  but has been off since August.  She has been on Breztri  for COPD, was not using reliably when I saw her in June so we stopped it. Today she reports that she continues to experience chronic cough, is having more exertional SOB - notices when shopping. She has gained 20 lbs and feels  that she is retaining fluid since coming off Keytruda . She has dry cough a few times a day, happens at night also. Had flaring sx in October and was treated w abx, codeine cough syrup. She also took the Breztri  on a schedule during that spell. She is off loratadine and PPI.   CT scan of the chest, abdomen, pelvis 03/31/2024 reviewed by me showed decreased left axillary lymphadenopathy, 5 mm subpleural nodule in the lateral right upper lobe, 10 mm nodular component also unchanged.  No new nodules.  She continues to smoke approximately    Review of Systems As per HPI  Past Medical History:  Diagnosis Date   Allergy    Anemia    Anxiety    no current tx   Asthma    Blood transfusion without reported diagnosis 02/12/2023   Cataract    CHF (congestive heart failure) (HCC)    Depression    no meds at present   Dyspnea    Family history of adverse reaction to anesthesia    pt states mom had allergic reaction to some unknown anesthesia   GERD (gastroesophageal reflux disease)    no tx since weight loss   Hypercholesteremia    Neuromuscular disorder (HCC)    Osteoarthritis    stage IV lung ca 11/2021     Family History  Problem Relation Age of Onset   Heart disease Mother    Kidney cancer Mother    Cancer Mother    Emphysema Father    Heart disease Father    Heart disease Sister  Colon cancer Neg Hx      Social History   Socioeconomic History   Marital status: Married    Spouse name: Yvette Curry   Number of children: Y   Years of education: Not on file   Highest education level: GED or equivalent  Occupational History   Occupation: Personnel Officer: UNEMPLOYED  Tobacco Use   Smoking status: Every Day    Current packs/day: 0.50    Average packs/day: 0.5 packs/day for 37.0 years (18.5 ttl pk-yrs)    Types: Cigarettes    Passive exposure: Current   Smokeless tobacco: Never   Tobacco comments:    Smokes half a pack of cigarettes a day.   Vaping Use   Vaping status:  Former  Substance and Sexual Activity   Alcohol  use: No   Drug use: No   Sexual activity: Yes    Comment: hysterectomy  Other Topics Concern   Not on file  Social History Narrative   Not on file   Social Drivers of Health   Tobacco Use: High Risk (04/06/2024)   Patient History    Smoking Tobacco Use: Every Day    Smokeless Tobacco Use: Never    Passive Exposure: Current  Financial Resource Strain: Low Risk (02/08/2024)   Overall Financial Resource Strain (CARDIA)    Difficulty of Paying Living Expenses: Not hard at all  Food Insecurity: No Food Insecurity (02/08/2024)   Epic    Worried About Programme Researcher, Broadcasting/film/video in the Last Year: Never true    Ran Out of Food in the Last Year: Never true  Transportation Needs: No Transportation Needs (02/08/2024)   Epic    Lack of Transportation (Medical): No    Lack of Transportation (Non-Medical): No  Physical Activity: Insufficiently Active (02/08/2024)   Exercise Vital Sign    Days of Exercise per Week: 1 day    Minutes of Exercise per Session: 20 min  Stress: No Stress Concern Present (02/08/2024)   Yvette Curry    Feeling of Stress: Not at all  Social Connections: Socially Integrated (02/08/2024)   Social Connection and Isolation Panel    Frequency of Communication with Friends and Family: More than three times a week    Frequency of Social Gatherings with Friends and Family: Three times a week    Attends Religious Services: More than 4 times per year    Active Member of Clubs or Organizations: Yes    Attends Banker Meetings: More than 4 times per year    Marital Status: Married  Catering Manager Violence: Not At Risk (06/19/2023)   Humiliation, Afraid, Rape, and Kick Curry    Fear of Current or Ex-Partner: No    Emotionally Abused: No    Physically Abused: No    Sexually Abused: No  Depression (PHQ2-9): Low Risk (02/16/2024)   Depression  (PHQ2-9)    PHQ-2 Score: 0  Alcohol  Screen: Low Risk (06/19/2023)   Alcohol  Screen    Last Alcohol  Screening Score (AUDIT): 0  Housing: Low Risk (02/08/2024)   Epic    Unable to Pay for Housing in the Last Year: No    Number of Times Moved in the Last Year: 0    Homeless in the Last Year: No  Utilities: Not At Risk (06/19/2023)   AHC Utilities    Threatened with loss of utilities: No  Health Literacy: Adequate Health Literacy (06/19/2023)   B1300 Health Literacy    Frequency  of need for help with medical instructions: Never     Allergies  Allergen Reactions   Lidocaine  Shortness Of Breath and Anxiety    Patient felt like she couldn't breathe, panicky Allergic to all  caines   Mepivacaine Swelling    angioedema   Demerol  Nausea And Vomiting   Prednisone Hives and Nausea And Vomiting    abd pain and vomiting, Hives    Sulfa Antibiotics Hives    Hives, swelling and itching     Outpatient Medications Prior to Visit  Medication Sig Dispense Refill   albuterol  (VENTOLIN  HFA) 108 (90 Base) MCG/ACT inhaler Inhale 1-2 puffs into the lungs every 6 (six) hours as needed for wheezing or shortness of breath. 8 g 0   ascorbic acid  (VITAMIN C ) 500 MG tablet Take 1,000 mg by mouth daily.     Budeson-Glycopyrrol-Formoterol  (BREZTRI  AEROSPHERE) 160-9-4.8 MCG/ACT AERO Inhale 2 puffs into the lungs in the morning and at bedtime. 10.7 g 3   carboxymethylcellulose 1 % ophthalmic solution Apply 1-2 drops to eye as needed (dry eye).     Cholecalciferol (VITAMIN D ) 50 MCG (2000 UT) CAPS Take by mouth.     ferrous sulfate  324 MG TBEC Take 1 tablet (324 mg total) by mouth daily with breakfast. 30 tablet 4   Homeopathic Products (CHESTAL HONEY COUGH PO) Take by mouth.     loratadine (CLARITIN) 10 MG tablet Take 10 mg by mouth daily.     LORazepam  (ATIVAN ) 0.5 MG tablet Take 1 tablet about 30-60 minutes before MRI 2 tablet 0   midodrine  (PROAMATINE ) 5 MG tablet Take 1 tablet (5 mg total) by mouth 3  (three) times daily as needed (If your Systolic Blood Pressure is less than 90). 90 tablet 1   nystatin  (MYCOSTATIN /NYSTOP ) powder Apply 1 Application topically 3 (three) times daily. 30 g 0   pantoprazole  (PROTONIX ) 40 MG tablet TAKE 1 TABLET BY MOUTH EVERY DAY BEFORE BREAKFAST 90 tablet 1   Probiotic Product (PROBIOTIC PO) Take 1 capsule by mouth daily.     triamcinolone  ointment (KENALOG ) 0.5 % Apply 1 Application topically 2 (two) times daily. 30 g 0   Turmeric (QC TUMERIC COMPLEX PO) Take 500 mg by mouth.     zinc gluconate 50 MG tablet Take 100 mg by mouth daily.     amoxicillin -clavulanate (AUGMENTIN ) 875-125 MG tablet Take 1 tablet by mouth 2 (two) times daily. 14 tablet 0   chlorpheniramine-HYDROcodone  (TUSSIONEX) 10-8 MG/5ML Take 5 mLs by mouth every 12 (twelve) hours as needed for cough. (Patient not taking: Reported on 04/06/2024) 115 mL 0   HYDROcodone  bit-homatropine (HYCODAN) 5-1.5 MG/5ML syrup Take 5 mLs by mouth every 8 (eight) hours as needed for cough. (Patient not taking: Reported on 04/06/2024) 120 mL 0   promethazine -dextromethorphan (PROMETHAZINE -DM) 6.25-15 MG/5ML syrup Take 5 mLs by mouth 4 (four) times daily as needed for cough. (Patient not taking: Reported on 04/06/2024) 118 mL 0   No facility-administered medications prior to visit.        Objective:   Physical Exam  Vitals:   04/06/24 1303 04/06/24 1327  BP: (!) 141/92 (!) 143/90  Pulse: 89   Temp: 98.5 F (36.9 C)   TempSrc: Oral   SpO2: 95%   Weight: 167 lb (75.8 kg)   Height: 5' 1 (1.549 m)     Gen: Pleasant, well-nourished, in no distress,  normal affect  ENT: No lesions,  mouth clear,  oropharynx clear, no postnasal drip  Neck: No JVD, no  stridor  Lungs: No use of accessory muscles, few scattered rhonchi but mostly clear.  No wheezing.  Cardiovascular: RRR, heart sounds normal, no murmur or gallops, no peripheral edema  Musculoskeletal: No deformities, no cyanosis or clubbing  Neuro:  alert, awake, non focal  Skin: Warm, no lesions or rash      Assessment & Plan:   COPD (chronic obstructive pulmonary disease) (HCC) She still deals with similar symptoms, intermittent dyspnea, cough that is more intrusive.  She had flaring symptoms in October and was treated with antibiotics, took her Breztri  on a schedule at that time.  Since then she has been off it except for intermittent use.  It sounds like unless she is flaring she does not need to be on maintenance.  She occasionally uses albuterol .  We will stay off the Breztri , keep the albuterol  available to use if needed  Adenocarcinoma of right lung, stage 4 (HCC) Since I last saw her she has come off Keytruda .  Her most recent surveillance imaging 03/31/2024 was actually improved with no evidence of disease activation, no new findings.  Her axillary lymphadenopathy was improved.  She is doing close surveillance with Dr. Sherrod  Chronic cough This is her most active symptom, happens intermittently.  She is not on loratadine or her pantoprazole .  I talked to her about the benefits of trying to treat rhinitis and GERD as precipitants of cough.  No plans to start scheduled therapy at this time.  Tobacco abuse Continues to smoke a 0.5 packs daily.  Counseled her on benefits of cessation and the relationship of this to her cough  I personally spent a total of 38 minutes in the care of the patient today including preparing to see the patient, getting/reviewing separately obtained history, performing a medically appropriate exam/evaluation, counseling and educating, documenting clinical information in the EHR, independently interpreting results, and communicating results.   Lamar Chris, MD, PhD 04/06/2024, 2:47 PM [facility] [phone] or if no answer before 7:00PM call [phone] For any issues after 7:00PM please call eLink [phone]

## 2024-04-06 NOTE — Assessment & Plan Note (Signed)
 Since I last saw her she has come off Keytruda .  Her most recent surveillance imaging 03/31/2024 was actually improved with no evidence of disease activation, no new findings.  Her axillary lymphadenopathy was improved.  She is doing close surveillance with Dr. Sherrod

## 2024-04-06 NOTE — Patient Instructions (Addendum)
 We will hold off on restarting your Breztri  on every day schedule. Keep albuterol  available to use 2 puffs if needed for shortness of breath, chest tightness, wheeze. If your cough begins to increase then we should consider getting you back on loratadine and pantoprazole  to control the factors that impact dry cough. We reviewed your CT scan of the chest/abdomen/pelvis today. Continue to follow with Dr. Sherrod as planned Agree with cardiology reevaluation given your swelling and increase in weight. Follow with Dr. Shelah in 12 months or sooner if you have any problems.

## 2024-04-06 NOTE — Assessment & Plan Note (Addendum)
 She still deals with similar symptoms, intermittent dyspnea, cough that is more intrusive.  She had flaring symptoms in October and was treated with antibiotics, took her Breztri  on a schedule at that time.  Since then she has been off it except for intermittent use.  It sounds like unless she is flaring she does not need to be on maintenance.  She occasionally uses albuterol .  We will stay off the Breztri , keep the albuterol  available to use if needed

## 2024-04-06 NOTE — Assessment & Plan Note (Signed)
 This is her most active symptom, happens intermittently.  She is not on loratadine or her pantoprazole .  I talked to her about the benefits of trying to treat rhinitis and GERD as precipitants of cough.  No plans to start scheduled therapy at this time.

## 2024-04-07 ENCOUNTER — Telehealth: Payer: Self-pay | Admitting: Internal Medicine

## 2024-04-07 DIAGNOSIS — R0602 Shortness of breath: Secondary | ICD-10-CM

## 2024-04-07 NOTE — Telephone Encounter (Signed)
 Spoke to patient who stated that she is holding fluid. Pt stated that weight is going up and down- 165, 164, 168. Pt stated that she had last chemo treatment 12/10/2023 and c/o SOB. Pt stated that she does not have pitting edema. Pt stated that while wearing jeans, her left leg feels like it is tighter than the right side. Please advise.

## 2024-04-07 NOTE — Telephone Encounter (Signed)
 Pt c/o swelling/edema: STAT if pt has developed SOB within 24 hours  If swelling, where is the swelling located?   Both legs, mostly in left leg  How much weight have you gained and in what time span?   Yes  Have you gained 2 pounds in a day or 5 pounds in a week?   Unknown  Do you have a log of your daily weights (if so, list)?   No  Are you currently taking a fluid pill?   No  Are you currently SOB?   Yes - Stage IV Lung Cancer  Have you traveled recently in a car or plane for an extended period of time?   No  Patient is concerned her legs have been swelling and noted she has been having chemotherapy treatments.  Patient has appointment scheduled on 1/27 on [address], [place].

## 2024-04-08 ENCOUNTER — Other Ambulatory Visit (HOSPITAL_COMMUNITY)
Admission: RE | Admit: 2024-04-08 | Discharge: 2024-04-08 | Disposition: A | Discharge status: Home / Self Care | Source: Ambulatory Visit | Attending: Internal Medicine | Admitting: Internal Medicine

## 2024-04-08 DIAGNOSIS — R0602 Shortness of breath: Secondary | ICD-10-CM | POA: Insufficient documentation

## 2024-04-08 LAB — PRO BRAIN NATRIURETIC PEPTIDE: Pro Brain Natriuretic Peptide: 66.3 pg/mL

## 2024-04-08 NOTE — Telephone Encounter (Signed)
 Patient notified and verbalized understanding and had no further questions or concerns at this time.   Will route message to schedulers to get pt scheduled for Echo.

## 2024-04-13 ENCOUNTER — Ambulatory Visit: Payer: Self-pay | Admitting: Internal Medicine

## 2024-04-15 ENCOUNTER — Ambulatory Visit (HOSPITAL_COMMUNITY)
Admission: RE | Admit: 2024-04-15 | Discharge: 2024-04-15 | Disposition: A | Discharge status: Home / Self Care | Source: Ambulatory Visit | Attending: Internal Medicine | Admitting: Internal Medicine

## 2024-04-15 DIAGNOSIS — R0602 Shortness of breath: Secondary | ICD-10-CM | POA: Diagnosis present

## 2024-04-15 LAB — ECHOCARDIOGRAM COMPLETE
Area-P 1/2: 4.15 cm2
Calc EF: 57.1 %
Est EF: 55
P 1/2 time: 511 ms
S' Lateral: 3.2 cm
Single Plane A2C EF: 57.8 %
Single Plane A4C EF: 59 %

## 2024-04-15 NOTE — Progress Notes (Signed)
*  PRELIMINARY RESULTS* Echocardiogram 2D Echocardiogram has been performed.  Yvette Curry 04/15/2024, 12:27 PM

## 2024-04-18 ENCOUNTER — Ambulatory Visit: Payer: Self-pay | Admitting: Internal Medicine
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
## 2024-04-28 ENCOUNTER — Inpatient Hospital Stay

## 2024-04-28 ENCOUNTER — Ambulatory Visit

## 2024-04-29 ENCOUNTER — Ambulatory Visit (INDEPENDENT_AMBULATORY_CARE_PROVIDER_SITE_OTHER): Admitting: Ophthalmology

## 2024-04-29 DIAGNOSIS — Z961 Presence of intraocular lens: Secondary | ICD-10-CM | POA: Diagnosis not present

## 2024-04-29 DIAGNOSIS — H04123 Dry eye syndrome of bilateral lacrimal glands: Secondary | ICD-10-CM | POA: Diagnosis not present

## 2024-04-29 DIAGNOSIS — H35711 Central serous chorioretinopathy, right eye: Secondary | ICD-10-CM

## 2024-04-29 DIAGNOSIS — H353211 Exudative age-related macular degeneration, right eye, with active choroidal neovascularization: Secondary | ICD-10-CM | POA: Diagnosis not present

## 2024-04-29 DIAGNOSIS — C3491 Malignant neoplasm of unspecified part of right bronchus or lung: Secondary | ICD-10-CM

## 2024-05-01 ENCOUNTER — Encounter (INDEPENDENT_AMBULATORY_CARE_PROVIDER_SITE_OTHER): Payer: Self-pay | Admitting: Ophthalmology

## 2024-05-01 MED ORDER — FARICIMAB-SVOA 6 MG/0.05ML IZ SOSY
6.0000 mg | PREFILLED_SYRINGE | INTRAVITREAL | Status: AC | PRN
  Administered 2024-05-01: 6 mg via INTRAVITREAL

## 2024-05-05 ENCOUNTER — Inpatient Hospital Stay: Admitting: Physician Assistant

## 2024-05-10 ENCOUNTER — Inpatient Hospital Stay: Admitting: Nurse Practitioner

## 2024-05-10 ENCOUNTER — Inpatient Hospital Stay: Attending: Internal Medicine

## 2024-05-10 VITALS — BP 143/78 | HR 87 | Temp 97.9°F | Resp 17 | Ht 61.0 in | Wt 168.0 lb

## 2024-05-10 DIAGNOSIS — G4709 Other insomnia: Secondary | ICD-10-CM

## 2024-05-10 DIAGNOSIS — C3491 Malignant neoplasm of unspecified part of right bronchus or lung: Secondary | ICD-10-CM | POA: Diagnosis not present

## 2024-05-10 DIAGNOSIS — Z515 Encounter for palliative care: Secondary | ICD-10-CM | POA: Diagnosis not present

## 2024-05-10 DIAGNOSIS — R530 Neoplastic (malignant) related fatigue: Secondary | ICD-10-CM

## 2024-05-10 DIAGNOSIS — K140 Glossitis: Secondary | ICD-10-CM | POA: Diagnosis not present

## 2024-05-11 ENCOUNTER — Encounter: Payer: Self-pay | Admitting: Nurse Practitioner

## 2024-05-11 ENCOUNTER — Encounter: Payer: Self-pay | Admitting: Physician Assistant

## 2024-05-11 NOTE — Progress Notes (Signed)
 "    Palliative Medicine [facility]  [phone] [phone]   Name: Yvette Curry Date: 05/11/2024 MRN: [id number]  DOB: 08-24-1955  Patient Care Team: [name], DO as PCP - General (Family Medicine) Mallipeddi, Diannah SQUIBB, MD as PCP - Cardiology (Cardiology) Carolee Sherwood JONETTA DOUGLAS, MD as Consulting Physician (Urology) Pickenpack-Cousar, Fannie SAILOR, NP as Nurse Practitioner (Hospice and Palliative Medicine) Rudy Josette RAMAN, PA-C as Physician Assistant (Gastroenterology) Pickenpack-Cousar, Fannie SAILOR, NP as Nurse Practitioner (Hospice and Palliative Medicine) Heilingoetter, Calton CROME, PA-C as Physician Assistant (Physician Assistant) Valdemar Rogue, MD as Consulting Physician (Ophthalmology) Eartha Flavors, Toribio, MD as Consulting Physician (Gastroenterology) Serene Gaile ORN, MD as Consulting Physician (Vascular Surgery) Sherrod Sherrod, MD as Consulting Physician (Oncology)   INTERVAL HISTORY: Yvette Curry is a 69 y.o. 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 Palliative ask to see for symptom management and goals of care.   SOCIAL HISTORY:     reports that she has been smoking cigarettes. She has a 18.5 pack-year smoking history. She has been exposed to tobacco smoke. She has never used smokeless tobacco. She reports that she does not drink alcohol  and does not use drugs.  ADVANCE DIRECTIVES:  None on file   CODE STATUS: Full code  PAST MEDICAL HISTORY: Past Medical History:  Diagnosis Date   Allergy    Anemia    Anxiety    no current tx   Asthma    Blood transfusion without reported diagnosis 02/12/2023   Cataract    CHF (congestive heart failure) (HCC)    Depression    no meds at present   Dyspnea    Family history of adverse reaction to  anesthesia    pt states mom had allergic reaction to some unknown anesthesia   GERD (gastroesophageal reflux disease)    no tx since weight loss   Hypercholesteremia    Neuromuscular disorder (HCC)    Osteoarthritis    stage IV lung 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  MEDICATIONS:  Current Outpatient Medications  Medication Sig Dispense Refill   albuterol  (VENTOLIN  HFA) 108 (90 Base) MCG/ACT inhaler Inhale 1-2 puffs into the lungs every 6 (six) hours as needed for wheezing or shortness of breath. 8 g 0   amoxicillin -clavulanate (AUGMENTIN ) 875-125 MG tablet Take 1 tablet by mouth 2 (two) times daily. 14 tablet 0   ascorbic acid  (VITAMIN C ) 500 MG tablet Take 1,000 mg by mouth daily.     Budeson-Glycopyrrol-Formoterol  (BREZTRI  AEROSPHERE) 160-9-4.8 MCG/ACT AERO Inhale 2 puffs into the lungs in the morning and at bedtime. 10.7 g 3   carboxymethylcellulose 1 % ophthalmic solution Apply 1-2 drops to eye as needed (dry eye).     chlorpheniramine-HYDROcodone  (TUSSIONEX) 10-8 MG/5ML Take 5 mLs by mouth every 12 (twelve) hours as needed for cough. (Patient not taking: Reported on 04/06/2024) 115 mL 0   Cholecalciferol (VITAMIN D ) 50 MCG (2000 UT) CAPS Take by mouth.     ferrous sulfate  324 MG TBEC Take 1 tablet (324 mg total) by mouth daily with breakfast. 30 tablet 4   Homeopathic Products (CHESTAL HONEY COUGH PO) Take by mouth.     HYDROcodone  bit-homatropine (HYCODAN) 5-1.5 MG/5ML syrup Take 5 mLs by mouth every 8 (eight) hours as needed for cough. (Patient not  taking: Reported on 04/06/2024) 120 mL 0   loratadine (CLARITIN) 10 MG tablet Take 10 mg by mouth daily.     LORazepam  (ATIVAN ) 0.5 MG tablet Take 1 tablet about 30-60 minutes before MRI 2 tablet 0   midodrine  (PROAMATINE ) 5 MG tablet Take 1 tablet (5 mg total) by mouth 3 (three) times daily as needed (If your Systolic Blood Pressure is less than 90). 90 tablet 1    nystatin  (MYCOSTATIN /NYSTOP ) powder Apply 1 Application topically 3 (three) times daily. 30 g 0   pantoprazole  (PROTONIX ) 40 MG tablet TAKE 1 TABLET BY MOUTH EVERY DAY BEFORE BREAKFAST 90 tablet 1   Probiotic Product (PROBIOTIC PO) Take 1 capsule by mouth daily.     promethazine -dextromethorphan (PROMETHAZINE -DM) 6.25-15 MG/5ML syrup Take 5 mLs by mouth 4 (four) times daily as needed for cough. (Patient not taking: Reported on 04/06/2024) 118 mL 0   triamcinolone  ointment (KENALOG ) 0.5 % Apply 1 Application topically 2 (two) times daily. 30 g 0   Turmeric (QC TUMERIC COMPLEX PO) Take 500 mg by mouth.     zinc gluconate 50 MG tablet Take 100 mg by mouth daily.     No current facility-administered medications for this visit.    VITAL SIGNS: BP (!) 143/78   Pulse 87   Temp 97.9 F (36.6 C) (Temporal)   Resp 17   Ht 5' 1 (1.549 m)   Wt 168 lb (76.2 kg)   SpO2 98%   BMI 31.74 kg/m  Filed Weights   05/10/24 1205  Weight: 168 lb (76.2 kg)     Estimated body mass index is 31.74 kg/m as calculated from the following:   Height as of this encounter: 5' 1 (1.549 m).   Weight as of this encounter: 168 lb (76.2 kg).   PERFORMANCE STATUS (ECOG) : 1 - Symptomatic but completely ambulatory  Assessment NAD RRR Normal breathing pattern AAO x 4  IMPRESSION: Discussed the use of AI scribe software for clinical note transcription with the patient, who gave verbal consent to proceed.  History of Present Illness Yvette Curry is a 69 year old female with non-small cell lung cancer who presents to clinic for symptom management follow-up.  No acute distress noted.  Patient is accompanied by her husband.  Denies concerns for uncontrolled nausea, vomiting, constipation, or diarrhea.  Denies pain or discomfort.  She has been experiencing swollen hands and left lower extremity and is currently under the care of a rheumatologist. An ultrasound and blood work have been performed, and she is  awaiting results. She reports that her rheumatologist mentioned several possible causes, including rheumatoid arthritis, but is waiting for all test results before making any decisions about treatment.  She has also been seeing a cardiologist due to concerns about fluid retention. An echocardiogram and blood work were performed, and the results were normal. She has a follow-up appointment scheduled with the cardiologist.  Yvette Curry continues to see a retina specialist for an ongoing issue with her eye. The fluid in her eye has decreased, but her vision remains unchanged. She has a follow-up appointment with Dr. Vivian, who initially referred her to the retina specialist.  She mentions experiencing a roughness on her tongue, which makes certain foods irritating to eat. This symptom is similar to what her sister, who is undergoing chemotherapy, is experiencing.  We discussed sensitivity related to previous cancer treatments.  Despite this appetite has returned after a period of weight loss, and she describes her current diet, which  includes beans with ham, bread and butter, and occasional eggs. She notes that she has never been a breakfast person and often eats at night (early morning hours).  Her current weight is stable at 168 pounds.  Some concerns with insomnia over the past several weeks which could be related to life stressors as well as sleep hygiene.  We discussed use of melatonin at bedtime dosing 2-5 mg as needed.  If no improvement may increase to 2 melatonin for support.  Overall Yvette Curry is doing well.  No unmet palliative needs at this time.  We will continue to closely follow and support.  Assessment & Plan Surveillance following treatment for malignant neoplasm Undergoing surveillance post-treatment for malignant neoplasm with concerns about potential mutation or spread  Insomnia Reports feeling different and unable to determine the cause. Considering melatonin as a treatment  option. - Start melatonin at 2-5 mg nightly. Increase to 10 mg if 5 mg is ineffective.  Glossitis Persistent glossitis with roughness and irritation on the tongue, exacerbated by certain foods. Suggesting possible treatment-related etiology.  Discussed potential foods and neutralizes to manage symptoms.  Health maintenance I will follow-up with patient in 4-6 weeks.  Sooner if needed.  Patient expressed understanding and was in agreement with this plan. She also understands that She can call the clinic at any time with any questions, concerns, or complaints.   Any controlled substances utilized were prescribed in the context of palliative care. PDMP has been reviewed.   I personally spent a total of 30 minutes in the care of the patient today including preparing to see the patient, getting/reviewing separately obtained history, performing a medically appropriate exam/evaluation, counseling and educating, and documenting clinical information in the EHR. Visit consisted of counseling and education dealing with the complex and emotionally intense issues of symptom management and palliative care in the setting of serious and potentially life-threatening illness.  Levon Borer, AGPCNP-BC  Palliative Medicine Team/[facility]    "

## 2024-05-12 ENCOUNTER — Encounter: Payer: Self-pay | Admitting: Physician Assistant

## 2024-05-16 NOTE — Progress Notes (Unsigned)
 " Cardiology Office Note:  .   Date:  05/16/2024  ID:  Kinga E Boutwell, DOB [birth year], MRN [id number] PCP: Cook, [name], DO  [facility] Providers Cardiologist:  Diannah SHAUNNA Maywood, MD { Click to update primary MD,subspecialty MD or APP then REFRESH:1}   History of Present Illness: .   Ricci E Deery is a 69 y.o. female *** with PMHx of HFpEF, HTN, HLD, hx of pericardial effusion, recurrent cellulitis, stage IV lung cancer (adenocarcinoma with widespread metastatic adenopathy to the ipsilateral hilum, bilateral mediastinum, bilateral neck, left axilla and left mesentery diagnosed in 11/2021)  who reports to [facility] office for follow up.   Pertinent cardiac medical history:  Family cardiac history: Congestive HF (mother - ICD/LVAD), sick sinus syndrome s/p PM (sister) HFpEF EF 45-50% in 06/2022, 55% in 03/2023 Echo 03/2023: EF, 55%, small pericardial effusion circumferentially but moderate pericardial effusion anteriorly, mild AV regurgitation  Echo 03/2024: EF 55%, mild hypokinesis of anterior and anteroseptal wall, G1DD, mild to moderate AV regurgitation. Recommend Cardiac PET.  Previously noted that DOE improved with chemotherapy.  Previously not on GDMT due to hypotension.  Heart monitor 10/2022: NSR, Avg HR 91, <1% PAC/PVC Multiple hospitalizations in 2024 for recurrent right lower extremity cellulitis, currently resolved and follows with vascular surgery.   Last seen in heartcare 09/2023 by Dr. Mallipeddi for follow up. Doing well at the time from cardiac standpoint. Noted no need for midodrine  in last 6 months. Continued on Midodrine  5 mg TID prn.   She called office reporting LE edema, fluctuating weight, and SOB. Noted last chemo treatment was 12/10/2023. Follow up BNP was normal. Follow up Abnormal ECHO 03/2024 as above with recommendation of cardiac PET to evaluate for CAD.   Today, reports ### and denies ###.  Denies chest pain, shortness of breath, palpitations,  syncope, presyncope, dizziness, orthopnea, PND, swelling or significant weight changes, acute bleeding, or claudication.   Reports compliance with medications.  Dietary habitats:  Activity level:  Social: Denies tobacco use/alcohol /drug use  Denies any recent hospitalizations or visits to the emergency department.   ROS: 10 point review of system has been reviewed and considered negative except ones been listed in the HPI.   Studies Reviewed: SABRA    ECHO IMPRESSIONS 03/2024  1. Left ventricular ejection fraction, by estimation, is 55%. The left  ventricle has normal function. Left ventricular endocardial border not  optimally defined to evaluate regional wall motion but there appears to be  mild hypokinesis of anterior and  anteroseptal wall. Left ventricular diastolic parameters are consistent  with Grade I diastolic dysfunction (impaired relaxation). The average left  ventricular global longitudinal strain is -15.0 %.   2. Right ventricular systolic function is normal. The right ventricular  size is normal. Tricuspid regurgitation signal is inadequate for assessing  PA pressure.   3. The mitral valve is normal in structure. Trivial mitral valve  regurgitation. No evidence of mitral stenosis.   4. The aortic valve is tricuspid. Aortic valve regurgitation is mild to  moderate. No aortic stenosis is present. Aortic regurgitation PHT measures  511 msec.   5. The inferior vena cava is normal in size with greater than 50%  respiratory variability, suggesting right atrial pressure of 3 mmHg.  CV Studies: Cardiac studies reviewed are outlined and summarized above. Otherwise please see EMR for full report.   Risk Assessment/Calculations:   {Does this patient have ATRIAL FIBRILLATION?:[phone]} No BP recorded.  {Refresh Note OR Click here to enter BP  :  1}***       Physical Exam:   VS:  There were no vitals taken for this visit.   Wt Readings from Last 3 Encounters:  05/10/24 168 lb  (76.2 kg)  04/06/24 167 lb (75.8 kg)  04/04/24 168 lb (76.2 kg)    GEN: Well nourished, well developed in no acute distress while sitting in chair.  NECK: No JVD; No carotid bruits CARDIAC: ***RRR, no murmurs, rubs, gallops RESPIRATORY:  Clear to auscultation without rales, wheezing or rhonchi  ABDOMEN: Soft, non-tender, non-distended EXTREMITIES:  No edema; No deformity   ASSESSMENT AND PLAN: .   ***    {Are you ordering a CV Procedure (e.g. stress test, cath, DCCV, TEE, etc)?   Press F2        :[id number]}  Dispo: ***  Signed, Lorette CINDERELLA Kapur, PA-C  "

## 2024-05-17 ENCOUNTER — Encounter: Payer: Self-pay | Admitting: Physician Assistant

## 2024-05-17 ENCOUNTER — Ambulatory Visit: Attending: Physician Assistant | Admitting: Physician Assistant

## 2024-05-17 VITALS — BP 120/80 | HR 94 | Ht 61.0 in | Wt 172.6 lb

## 2024-05-17 DIAGNOSIS — R5383 Other fatigue: Secondary | ICD-10-CM | POA: Diagnosis not present

## 2024-05-17 DIAGNOSIS — R609 Edema, unspecified: Secondary | ICD-10-CM

## 2024-05-17 DIAGNOSIS — I5032 Chronic diastolic (congestive) heart failure: Secondary | ICD-10-CM | POA: Diagnosis not present

## 2024-05-17 DIAGNOSIS — R635 Abnormal weight gain: Secondary | ICD-10-CM

## 2024-05-17 DIAGNOSIS — C3491 Malignant neoplasm of unspecified part of right bronchus or lung: Secondary | ICD-10-CM

## 2024-05-17 DIAGNOSIS — I10 Essential (primary) hypertension: Secondary | ICD-10-CM

## 2024-05-17 DIAGNOSIS — I3139 Other pericardial effusion (noninflammatory): Secondary | ICD-10-CM

## 2024-05-17 DIAGNOSIS — I959 Hypotension, unspecified: Secondary | ICD-10-CM

## 2024-05-17 DIAGNOSIS — R0609 Other forms of dyspnea: Secondary | ICD-10-CM

## 2024-05-17 DIAGNOSIS — L0390 Cellulitis, unspecified: Secondary | ICD-10-CM

## 2024-05-17 DIAGNOSIS — R931 Abnormal findings on diagnostic imaging of heart and coronary circulation: Secondary | ICD-10-CM | POA: Diagnosis not present

## 2024-05-17 NOTE — Patient Instructions (Addendum)
 Medication Instructions:  Your physician recommends that you continue on your current medications as directed. Please refer to the Current Medication list given to you today.  Labwork: CMET, CBC, TSH, BNP Non-fasting [facility]  Testing/Procedures: Cardiac PET  Follow-Up: Your physician recommends that you schedule a follow-up appointment in: 2-3 months  Any Other Special Instructions Will Be Listed Below (If Applicable). You have been referred to Vein and Vascular Please reduce the sodium intake in your diet Please increase your water  intake  If you need a refill on your cardiac medications before your next appointment, please call your pharmacy.     Please report to Radiology at the [facility] Main Entrance 30 minutes early for your test.  [address] [place], [place] [address]                       How to Prepare for Your Cardiac PET/CT Stress Test:  Nothing to eat or drink, except water , 3 hours prior to arrival time.  NO caffeine /decaffeinated products, or chocolate 12 hours prior to arrival. (Please note decaffeinated beverages (teas/coffees) still contain caffeine ).  If you have caffeine  within 12 hours prior, the test will need to be rescheduled.  Medication instructions: Do not take erectile dysfunction medications for 72 hours prior to test (sildenafil, tadalafil) Do not take nitrates (isosorbide mononitrate, Ranexa) the day before or day of test Do not take tamsulosin the day before or morning of test Hold theophylline containing medications for 12 hours. Hold Dipyridamole 48 hours prior to the test.  Diabetic Preparation: If able to eat breakfast prior to 3 hour fasting, you may take all medications, including your insulin. Do not worry if you miss your breakfast dose of insulin - start at your next meal. If you do not eat prior to 3 hour fast-Hold all diabetes (oral and insulin) medications. Patients who wear a continuous glucose monitor MUST  remove the device prior to scanning.  You may take your remaining medications with water .  NO perfume, cologne or lotion on chest or abdomen area. FEMALES - Please avoid wearing dresses to this appointment.  Total time is 1 to 2 hours; you may want to bring reading material for the waiting time.  IF YOU THINK YOU MAY BE PREGNANT, OR ARE NURSING PLEASE INFORM THE TECHNOLOGIST.  In preparation for your appointment, medication and supplies will be purchased.  Appointment availability is limited, so if you need to cancel or reschedule, please call the Radiology Department Scheduler at [phone] 24 hours in advance to avoid a cancellation fee of $100.00  What to Expect When you Arrive:  Once you arrive and check in for your appointment, you will be taken to a preparation room within the Radiology Department.  A technologist or Nurse will obtain your medical history, verify that you are correctly prepped for the exam, and explain the procedure.  Afterwards, an IV will be started in your arm and electrodes will be placed on your skin for EKG monitoring during the stress portion of the exam. Then you will be escorted to the PET/CT scanner.  There, staff will get you positioned on the scanner and obtain a blood pressure and EKG.  During the exam, you will continue to be connected to the EKG and blood pressure machines.  A small, safe amount of a radioactive tracer will be injected in your IV to obtain a series of pictures of your heart along with an injection of a stress  agent.    After your Exam:  It is recommended that you eat a meal and drink a caffeinated beverage to counter act any effects of the stress agent.  Drink plenty of fluids for the remainder of the day and urinate frequently for the first couple of hours after the exam.  Your doctor will inform you of your test results within 7-10 business days.  For more information and frequently asked questions, please visit our  website: [email or web address]  For questions about your test or how to prepare for your test, please call: Cardiac Imaging Nurse Navigators Office: [phone]

## 2024-05-18 ENCOUNTER — Ambulatory Visit: Admitting: Family Medicine

## 2024-05-18 ENCOUNTER — Encounter: Payer: Self-pay | Admitting: Family Medicine

## 2024-05-18 VITALS — BP 137/87 | HR 89 | Temp 97.9°F | Ht 61.0 in | Wt 173.4 lb

## 2024-05-18 DIAGNOSIS — L03039 Cellulitis of unspecified toe: Secondary | ICD-10-CM

## 2024-05-18 DIAGNOSIS — Z13220 Encounter for screening for lipoid disorders: Secondary | ICD-10-CM

## 2024-05-18 DIAGNOSIS — J449 Chronic obstructive pulmonary disease, unspecified: Secondary | ICD-10-CM | POA: Diagnosis not present

## 2024-05-18 MED ORDER — BREZTRI AEROSPHERE 160-9-4.8 MCG/ACT IN AERO: 2.0000 | INHALATION_SPRAY | Freq: Two times a day (BID) | RESPIRATORY_TRACT | 3 refills | Status: AC

## 2024-05-18 MED ORDER — MUPIROCIN 2 % EX OINT: 1.0000 | TOPICAL_OINTMENT | Freq: Three times a day (TID) | CUTANEOUS | 0 refills | Status: DC

## 2024-05-18 MED ORDER — DOXYCYCLINE HYCLATE 100 MG PO TABS: 100.0000 mg | ORAL_TABLET | Freq: Two times a day (BID) | ORAL | 0 refills | Status: AC

## 2024-05-18 MED ORDER — ALBUTEROL SULFATE HFA 108 (90 BASE) MCG/ACT IN AERS: 1.0000 | INHALATION_SPRAY | Freq: Four times a day (QID) | RESPIRATORY_TRACT | 0 refills | Status: DC | PRN

## 2024-05-18 NOTE — Patient Instructions (Addendum)
 Medications as directed.  Labs today.  If toe issue does not resolve, please let me know.  Follow up in 3-6 months

## 2024-05-18 NOTE — Progress Notes (Signed)
 "  Subjective:  Patient ID: Yvette Curry, female    DOB: 07-Feb-1956  Age: 69 y.o. MRN: [id number]  CC:   Chief Complaint  Patient presents with   Toe Injury    Pt states she was cleaning up her left big toe and must of did something because it is now pussing and bleeding and hurts. States it hurt her before though. She also was c/o of her breathing when talking she took a moment to take a few breathes before finishing what she was saying.    HPI:  69 year old female presents for evaluation of the above.  Patient reports that for the past 3 days she has had tenderness, redness, and some drainage from around the nailbed of the left great toe.  Pain is a moderate in severity.  She has tried soaking the area and applying triple antibiotic ointment without resolution.  Patient also reports that she had some shortness of breath in the office today.  Currently appears well.  Oxygen  saturation 95%.  Patient Active Problem List   Diagnosis Date Noted   Paronychia of great toe 05/18/2024   Back pain of thoracolumbar region 01/11/2024   Lower extremity edema 05/21/2023   Gastroesophageal reflux disease 05/14/2023   Encounter for antineoplastic immunotherapy 03/11/2023   CAD (coronary artery disease) 02/17/2023   Ascending aorta dilation 02/17/2023   Heart failure with improved ejection fraction (HFimpEF) (HCC) 07/01/2022   Aortic atherosclerosis 06/04/2022   COPD (chronic obstructive pulmonary disease) (HCC) 06/04/2022   Neutropenia 03/27/2022   Adenocarcinoma of right lung, stage 4 (HCC) 12/12/2021   Tobacco abuse 11/06/2014   Chronic cough 11/06/2010    Social Hx   Social History   Socioeconomic History   Marital status: Married    Spouse name: Emil   Number of children: Y   Years of education: Not on file   Highest education level: GED or equivalent  Occupational History   Occupation: Personnel Officer: UNEMPLOYED  Tobacco Use   Smoking status: Every Day    Current  packs/day: 0.50    Average packs/day: 0.5 packs/day for 37.0 years (18.5 ttl pk-yrs)    Types: Cigarettes    Passive exposure: Current   Smokeless tobacco: Never   Tobacco comments:    Smokes half a pack of cigarettes a day.   Vaping Use   Vaping status: Former  Substance and Sexual Activity   Alcohol  use: No   Drug use: No   Sexual activity: Yes    Comment: hysterectomy  Other Topics Concern   Not on file  Social History Narrative   Not on file   Social Drivers of Health   Tobacco Use: High Risk (05/18/2024)   Patient History    Smoking Tobacco Use: Every Day    Smokeless Tobacco Use: Never    Passive Exposure: Current  Financial Resource Strain: Low Risk (02/08/2024)   Overall Financial Resource Strain (CARDIA)    Difficulty of Paying Living Expenses: Not hard at all  Food Insecurity: No Food Insecurity (02/08/2024)   Epic    Worried About Programme Researcher, Broadcasting/film/video in the Last Year: Never true    Ran Out of Food in the Last Year: Never true  Transportation Needs: No Transportation Needs (02/08/2024)   Epic    Lack of Transportation (Medical): No    Lack of Transportation (Non-Medical): No  Physical Activity: Insufficiently Active (02/08/2024)   Exercise Vital Sign    Days of Exercise per Week:  1 day    Minutes of Exercise per Session: 20 min  Stress: No Stress Concern Present (02/08/2024)   [employer] of Occupational Health - Occupational Stress Questionnaire    Feeling of Stress: Not at all  Social Connections: Socially Integrated (02/08/2024)   Social Connection and Isolation Panel    Frequency of Communication with Friends and Family: More than three times a week    Frequency of Social Gatherings with Friends and Family: Three times a week    Attends Religious Services: More than 4 times per year    Active Member of Clubs or Organizations: Yes    Attends Banker Meetings: More than 4 times per year    Marital Status: Married  Depression (PHQ2-9):  Low Risk (05/18/2024)   Depression (PHQ2-9)    PHQ-2 Score: 3  Alcohol  Screen: Low Risk (06/19/2023)   Alcohol  Screen    Last Alcohol  Screening Score (AUDIT): 0  Housing: Low Risk (02/08/2024)   Epic    Unable to Pay for Housing in the Last Year: No    Number of Times Moved in the Last Year: 0    Homeless in the Last Year: No  Utilities: Not At Risk (06/19/2023)   AHC Utilities    Threatened with loss of utilities: No  Health Literacy: Adequate Health Literacy (06/19/2023)   B1300 Health Literacy    Frequency of need for help with medical instructions: Never    Review of Systems Per HPI  Objective:  BP 137/87   Pulse 89   Temp 97.9 F (36.6 C)   Ht 5' 1 (1.549 m)   Wt 173 lb 6.4 oz (78.7 kg)   SpO2 95%   BMI 32.76 kg/m      05/18/2024   10:19 AM 05/17/2024    1:46 PM 05/10/2024   12:16 PM  BP/Weight  Systolic BP 137 120 143  Diastolic BP 87 80 78  Wt. (Lbs) 173.4 172.6   BMI 32.76 kg/m2 32.61 kg/m2     Physical Exam Constitutional:      General: She is not in acute distress.    Appearance: Normal appearance.  HENT:     Head: Normocephalic and atraumatic.  Cardiovascular:     Rate and Rhythm: Normal rate and regular rhythm.  Pulmonary:     Effort: Pulmonary effort is normal.     Breath sounds: Normal breath sounds. No wheezing or rales.  Musculoskeletal:     Comments: Left great toe with tenderness to the medial aspect of the nailbed with surrounding erythema.  Neurological:     Mental Status: She is alert.     Lab Results  Component Value Date   WBC 6.8 03/31/2024   HGB 12.7 03/31/2024   HCT 37.6 03/31/2024   PLT 215 03/31/2024   GLUCOSE 108 (H) 03/31/2024   ALT 19 03/31/2024   AST 21 03/31/2024   NA 142 03/31/2024   K 4.3 03/31/2024   CL 106 03/31/2024   CREATININE 1.07 (H) 03/31/2024   BUN 17 03/31/2024   CO2 28 03/31/2024   TSH 1.080 10/26/2023   INR 1.0 03/15/2023     Assessment & Plan:  Paronychia of great toe Assessment &  Plan: Warm soaks, doxycycline , and Bactroban .  Orders: -     Mupirocin ; Apply 1 Application topically 3 (three) times daily for 7 days.  Dispense: 30 g; Refill: 0 -     Doxycycline  Hyclate; Take 1 tablet (100 mg total) by mouth 2 (two) times daily.  Dispense: 14 tablet; Refill: 0  Screening, lipid -     Lipid panel  Chronic obstructive pulmonary disease, unspecified COPD type (HCC) Assessment & Plan: Lungs clear.  Stable currently.  Orders: -     Breztri  Aerosphere; Inhale 2 puffs into the lungs in the morning and at bedtime.  Dispense: 10.7 g; Refill: 3 -     Albuterol  Sulfate HFA; Inhale 1-2 puffs into the lungs every 6 (six) hours as needed for wheezing or shortness of breath.  Dispense: 8 g; Refill: 0    Follow-up:  3-6 months  Mutasim Tuckey Bluford DO [facility] "

## 2024-05-18 NOTE — Assessment & Plan Note (Signed)
 Warm soaks, doxycycline , and Bactroban .

## 2024-05-18 NOTE — Assessment & Plan Note (Signed)
 Lungs clear.  Stable currently.

## 2024-05-19 ENCOUNTER — Ambulatory Visit: Payer: Self-pay | Admitting: Family Medicine

## 2024-05-19 LAB — LIPID PANEL
Chol/HDL Ratio: 5.1 ratio — ABNORMAL HIGH (ref 0.0–4.4)
Cholesterol, Total: 248 mg/dL — ABNORMAL HIGH (ref 100–199)
HDL: 49 mg/dL
LDL Chol Calc (NIH): 161 mg/dL — ABNORMAL HIGH (ref 0–99)
Triglycerides: 204 mg/dL — ABNORMAL HIGH (ref 0–149)
VLDL Cholesterol Cal: 38 mg/dL (ref 5–40)

## 2024-05-20 ENCOUNTER — Ambulatory Visit: Payer: Self-pay | Admitting: Physician Assistant

## 2024-05-20 LAB — BRAIN NATRIURETIC PEPTIDE: BNP: 20.1 pg/mL (ref 0.0–100.0)

## 2024-05-20 LAB — CBC
Hematocrit: 42.8 % (ref 34.0–46.6)
Hemoglobin: 13.6 g/dL (ref 11.1–15.9)
MCH: 31.5 pg (ref 26.6–33.0)
MCHC: 31.8 g/dL (ref 31.5–35.7)
MCV: 99 fL — ABNORMAL HIGH (ref 79–97)
Platelets: 219 10*3/uL (ref 150–450)
RBC: 4.32 x10E6/uL (ref 3.77–5.28)
RDW: 13 % (ref 11.7–15.4)
WBC: 6.6 10*3/uL (ref 3.4–10.8)

## 2024-05-20 LAB — COMPREHENSIVE METABOLIC PANEL WITH GFR
ALT: 16 [IU]/L (ref 0–32)
AST: 18 [IU]/L (ref 0–40)
Albumin: 4.1 g/dL (ref 3.9–4.9)
Alkaline Phosphatase: 83 [IU]/L (ref 49–135)
BUN/Creatinine Ratio: 20 (ref 12–28)
BUN: 20 mg/dL (ref 8–27)
Bilirubin Total: <0.2 mg/dL (ref 0.0–1.2)
CO2: 24 mmol/L (ref 20–29)
Calcium: 9.7 mg/dL (ref 8.7–10.3)
Chloride: 106 mmol/L (ref 96–106)
Creatinine, Ser: 1 mg/dL (ref 0.57–1.00)
Globulin, Total: 2.3 g/dL (ref 1.5–4.5)
Glucose: 100 mg/dL — ABNORMAL HIGH (ref 70–99)
Potassium: 4.9 mmol/L (ref 3.5–5.2)
Sodium: 144 mmol/L (ref 134–144)
Total Protein: 6.4 g/dL (ref 6.0–8.5)
eGFR: 62 mL/min/{1.73_m2}

## 2024-05-20 LAB — TSH: TSH: 1.93 u[IU]/mL (ref 0.450–4.500)

## 2024-05-25 ENCOUNTER — Encounter (HOSPITAL_COMMUNITY): Payer: Self-pay

## 2024-05-25 ENCOUNTER — Observation Stay (HOSPITAL_COMMUNITY)
Admission: EM | Admit: 2024-05-25 | Discharge: 2024-05-27 | Disposition: A | Source: Home / Self Care | Attending: Family Medicine | Admitting: Family Medicine

## 2024-05-25 ENCOUNTER — Other Ambulatory Visit: Payer: Self-pay

## 2024-05-25 ENCOUNTER — Emergency Department (HOSPITAL_COMMUNITY)

## 2024-05-25 DIAGNOSIS — Z720 Tobacco use: Secondary | ICD-10-CM | POA: Diagnosis present

## 2024-05-25 DIAGNOSIS — I5020 Unspecified systolic (congestive) heart failure: Secondary | ICD-10-CM | POA: Diagnosis present

## 2024-05-25 DIAGNOSIS — K56609 Unspecified intestinal obstruction, unspecified as to partial versus complete obstruction: Secondary | ICD-10-CM | POA: Diagnosis not present

## 2024-05-25 DIAGNOSIS — R1084 Generalized abdominal pain: Principal | ICD-10-CM

## 2024-05-25 DIAGNOSIS — L03039 Cellulitis of unspecified toe: Secondary | ICD-10-CM | POA: Diagnosis present

## 2024-05-25 DIAGNOSIS — J449 Chronic obstructive pulmonary disease, unspecified: Secondary | ICD-10-CM

## 2024-05-25 DIAGNOSIS — I2510 Atherosclerotic heart disease of native coronary artery without angina pectoris: Secondary | ICD-10-CM | POA: Diagnosis present

## 2024-05-25 DIAGNOSIS — C3491 Malignant neoplasm of unspecified part of right bronchus or lung: Secondary | ICD-10-CM | POA: Diagnosis present

## 2024-05-25 LAB — CBC WITH DIFFERENTIAL/PLATELET
Abs Immature Granulocytes: 0.04 10*3/uL (ref 0.00–0.07)
Basophils Absolute: 0.1 10*3/uL (ref 0.0–0.1)
Basophils Relative: 1 %
Eosinophils Absolute: 0.1 10*3/uL (ref 0.0–0.5)
Eosinophils Relative: 2 %
HCT: 40.3 % (ref 36.0–46.0)
Hemoglobin: 13.3 g/dL (ref 12.0–15.0)
Immature Granulocytes: 0 %
Lymphocytes Relative: 21 %
Lymphs Abs: 2 10*3/uL (ref 0.7–4.0)
MCH: 32 pg (ref 26.0–34.0)
MCHC: 33 g/dL (ref 30.0–36.0)
MCV: 96.9 fL (ref 80.0–100.0)
Monocytes Absolute: 0.8 10*3/uL (ref 0.1–1.0)
Monocytes Relative: 9 %
Neutro Abs: 6.3 10*3/uL (ref 1.7–7.7)
Neutrophils Relative %: 67 %
Platelets: 203 10*3/uL (ref 150–400)
RBC: 4.16 MIL/uL (ref 3.87–5.11)
RDW: 13.9 % (ref 11.5–15.5)
WBC: 9.3 10*3/uL (ref 4.0–10.5)
nRBC: 0 % (ref 0.0–0.2)

## 2024-05-25 LAB — URINALYSIS, ROUTINE W REFLEX MICROSCOPIC
Bilirubin Urine: NEGATIVE
Glucose, UA: NEGATIVE mg/dL
Ketones, ur: NEGATIVE mg/dL
Nitrite: NEGATIVE
Protein, ur: NEGATIVE mg/dL
Specific Gravity, Urine: 1.021 (ref 1.005–1.030)
pH: 5 (ref 5.0–8.0)

## 2024-05-25 LAB — COMPREHENSIVE METABOLIC PANEL WITH GFR
ALT: 18 U/L (ref 0–44)
AST: 18 U/L (ref 15–41)
Albumin: 3.9 g/dL (ref 3.5–5.0)
Alkaline Phosphatase: 78 U/L (ref 38–126)
Anion gap: 12 (ref 5–15)
BUN: 25 mg/dL — ABNORMAL HIGH (ref 8–23)
CO2: 27 mmol/L (ref 22–32)
Calcium: 9.9 mg/dL (ref 8.9–10.3)
Chloride: 101 mmol/L (ref 98–111)
Creatinine, Ser: 1.22 mg/dL — ABNORMAL HIGH (ref 0.44–1.00)
GFR, Estimated: 48 mL/min — ABNORMAL LOW
Glucose, Bld: 115 mg/dL — ABNORMAL HIGH (ref 70–99)
Potassium: 4.3 mmol/L (ref 3.5–5.1)
Sodium: 140 mmol/L (ref 135–145)
Total Bilirubin: 0.3 mg/dL (ref 0.0–1.2)
Total Protein: 6.8 g/dL (ref 6.5–8.1)

## 2024-05-25 LAB — LIPASE, BLOOD: Lipase: 43 U/L (ref 11–51)

## 2024-05-25 MED ORDER — HYDROMORPHONE HCL 1 MG/ML IJ SOLN
1.0000 mg | Freq: Once | INTRAMUSCULAR | Status: DC
  Filled 2024-05-25: qty 1

## 2024-05-25 MED ORDER — ONDANSETRON HCL 4 MG/2ML IJ SOLN: 4.0000 mg | Freq: Four times a day (QID) | INTRAMUSCULAR | Status: DC | PRN

## 2024-05-25 MED ORDER — BUDESON-GLYCOPYRROL-FORMOTEROL 160-9-4.8 MCG/ACT IN AERO
2.0000 | INHALATION_SPRAY | Freq: Two times a day (BID) | RESPIRATORY_TRACT | Status: DC
  Filled 2024-05-25 (×2): qty 5.9

## 2024-05-25 MED ORDER — MUPIROCIN 2 % EX OINT
1.0000 | TOPICAL_OINTMENT | Freq: Three times a day (TID) | CUTANEOUS | Status: DC
  Filled 2024-05-25 (×2): qty 22

## 2024-05-25 MED ORDER — BUDESON-GLYCOPYRROL-FORMOTEROL 160-9-4.8 MCG/ACT IN AERO: 2.0000 | INHALATION_SPRAY | Freq: Two times a day (BID) | RESPIRATORY_TRACT | Status: DC | PRN

## 2024-05-25 MED ORDER — HYDROMORPHONE HCL 1 MG/ML IJ SOLN
1.0000 mg | Freq: Once | INTRAMUSCULAR | Status: AC
  Administered 2024-05-25: 1 mg via INTRAVENOUS
  Filled 2024-05-25: qty 1

## 2024-05-25 MED ORDER — PANTOPRAZOLE SODIUM 40 MG IV SOLR
40.0000 mg | Freq: Every day | INTRAVENOUS | Status: DC
  Administered 2024-05-25 – 2024-05-27 (×3): 40 mg via INTRAVENOUS
  Filled 2024-05-25 (×3): qty 10

## 2024-05-25 MED ORDER — LACTATED RINGERS IV SOLN: INTRAVENOUS | Status: AC

## 2024-05-25 MED ORDER — ALBUTEROL SULFATE HFA 108 (90 BASE) MCG/ACT IN AERS: 1.0000 | INHALATION_SPRAY | Freq: Four times a day (QID) | RESPIRATORY_TRACT | Status: DC | PRN

## 2024-05-25 MED ORDER — SODIUM CHLORIDE 0.9 % IV SOLN
100.0000 mg | Freq: Two times a day (BID) | INTRAVENOUS | Status: DC
  Administered 2024-05-25 – 2024-05-26 (×3): 100 mg via INTRAVENOUS
  Filled 2024-05-25 (×6): qty 100

## 2024-05-25 MED ORDER — MORPHINE SULFATE (PF) 2 MG/ML IV SOLN: 2.0000 mg | INTRAVENOUS | Status: DC | PRN

## 2024-05-25 MED ORDER — ONDANSETRON HCL 4 MG/2ML IJ SOLN
4.0000 mg | Freq: Once | INTRAMUSCULAR | Status: AC
  Administered 2024-05-25: 4 mg via INTRAVENOUS
  Filled 2024-05-25: qty 2

## 2024-05-25 MED ORDER — ALBUTEROL SULFATE (2.5 MG/3ML) 0.083% IN NEBU: 2.5000 mg | INHALATION_SOLUTION | Freq: Four times a day (QID) | RESPIRATORY_TRACT | Status: DC | PRN

## 2024-05-25 MED ORDER — HYDRALAZINE HCL 20 MG/ML IJ SOLN: 10.0000 mg | INTRAMUSCULAR | Status: DC | PRN

## 2024-05-25 MED ORDER — PROCHLORPERAZINE EDISYLATE 10 MG/2ML IJ SOLN: 10.0000 mg | Freq: Four times a day (QID) | INTRAMUSCULAR | Status: DC | PRN

## 2024-05-25 MED ORDER — METOCLOPRAMIDE HCL 5 MG/ML IJ SOLN
10.0000 mg | Freq: Once | INTRAMUSCULAR | Status: AC
  Administered 2024-05-25: 10 mg via INTRAVENOUS
  Filled 2024-05-25: qty 2

## 2024-05-25 MED ORDER — IOHEXOL 300 MG/ML  SOLN
75.0000 mL | Freq: Once | INTRAMUSCULAR | Status: AC | PRN
  Administered 2024-05-25: 75 mL via INTRAVENOUS

## 2024-05-25 MED ORDER — SODIUM CHLORIDE 0.9% FLUSH
3.0000 mL | Freq: Two times a day (BID) | INTRAVENOUS | Status: DC
  Administered 2024-05-26: 3 mL via INTRAVENOUS

## 2024-05-25 MED ORDER — MORPHINE SULFATE (PF) 2 MG/ML IV SOLN
2.0000 mg | Freq: Once | INTRAVENOUS | Status: AC
  Administered 2024-05-25: 2 mg via INTRAVENOUS
  Filled 2024-05-25: qty 1

## 2024-05-25 MED ORDER — ENOXAPARIN SODIUM 40 MG/0.4ML IJ SOSY
40.0000 mg | PREFILLED_SYRINGE | INTRAMUSCULAR | Status: DC
  Filled 2024-05-25 (×2): qty 0.4

## 2024-05-25 MED ORDER — SODIUM CHLORIDE 0.9 % IV BOLUS
1000.0000 mL | Freq: Once | INTRAVENOUS | Status: AC
  Administered 2024-05-25: 1000 mL via INTRAVENOUS

## 2024-05-25 NOTE — ED Notes (Signed)
Pt ambulatory to bathroom and back to room 

## 2024-05-25 NOTE — ED Provider Notes (Signed)
 "  [place] EMER[facility]MENT AT [facility]  Provider Note  CSN: [id number] Arrival date & time: 05/25/24 9547  History Chief Complaint  Patient presents with   Abdominal Pain    Yvette Curry is a 69 y.o. female with history of lung cancer, s/p chemo and currently in remission as well as CHF with recent reassuring labs and echo reports several hours of worsening diffuse cramping abdominal pain, associated with nausea and bloating. She thought it might be gas or constipation but she had a large BM around 0300hrs without improvement. No fever. No known abdominal mets from cancer. Prior hysterectomy but no other abdominal surgeries.    Home Medications Prior to Admission medications  Medication Sig Start Date End Date Taking? Authorizing Provider  albuterol  (VENTOLIN  HFA) 108 (90 Base) MCG/ACT inhaler Inhale 1-2 puffs into the lungs every 6 (six) hours as needed for wheezing or shortness of breath. 05/18/24   Cook, [name], DO  ascorbic acid  (VITAMIN C ) 500 MG tablet Take 1,000 mg by mouth daily. 03/14/19   [provider]  budesonide -glycopyrrolate -formoterol  (BREZTRI  AEROSPHERE) 160-9-4.8 MCG/ACT AERO inhaler Inhale 2 puffs into the lungs in the morning and at bedtime. 05/18/24   Cook, [name], DO  carboxymethylcellulose 1 % ophthalmic solution Apply 1-2 drops to eye as needed (dry eye).    [provider]  Cholecalciferol (VITAMIN D ) 50 MCG (2000 UT) CAPS Take by mouth.    [provider]  doxycycline  (VIBRA -TABS) 100 MG tablet Take 1 tablet (100 mg total) by mouth 2 (two) times daily. 05/18/24   Cook, [name], DO  ferrous sulfate  324 MG TBEC Take 1 tablet (324 mg total) by mouth daily with breakfast. 10/21/22   Pearlean Manus, MD  Homeopathic Products (CHESTAL HONEY COUGH PO) Take by mouth.    [provider]  loratadine (CLARITIN) 10 MG tablet Take 10 mg by mouth daily as needed.    [provider]  midodrine  (PROAMATINE ) 5 MG  tablet Take 1 tablet (5 mg total) by mouth 3 (three) times daily as needed (If your Systolic Blood Pressure is less than 90). 03/23/23   Miriam Norris, NP  mupirocin  ointment (BACTROBAN ) 2 % Apply 1 Application topically 3 (three) times daily for 7 days. 05/18/24 05/25/24  Cook, [name], DO  nystatin  (MYCOSTATIN /NYSTOP ) powder Apply 1 Application topically 3 (three) times daily. 04/04/24   Heilingoetter, Cassandra L, PA-C  pantoprazole  (PROTONIX ) 40 MG tablet TAKE 1 TABLET BY MOUTH EVERY DAY BEFORE BREAKFAST 07/22/23   Harper, Kristen S, PA-C  triamcinolone  ointment (KENALOG ) 0.5 % Apply 1 Application topically 2 (two) times daily. 03/03/24   Pickenpack-Cousar, Athena N, NP  Turmeric (QC TUMERIC COMPLEX PO) Take 500 mg by mouth.    [provider]  zinc gluconate 50 MG tablet Take 100 mg by mouth daily.    [provider]     Allergies    Lidocaine , Mepivacaine, Demerol , Prednisone, and Sulfa antibiotics   Review of Systems   Review of Systems Please see HPI for pertinent positives and negatives  Physical Exam BP 94/60   Pulse 73   Temp 98.1 F (36.7 C) (Oral)   Resp 15   SpO2 94%   Physical Exam Vitals and nursing note reviewed.  Constitutional:      Appearance: Normal appearance.  HENT:     Head: Normocephalic and atraumatic.     Nose: Nose normal.     Mouth/Throat:     Mouth: Mucous membranes are  moist.  Eyes:     Extraocular Movements: Extraocular movements intact.     Conjunctiva/sclera: Conjunctivae normal.  Cardiovascular:     Rate and Rhythm: Normal rate.  Pulmonary:     Effort: Pulmonary effort is normal.     Breath sounds: Normal breath sounds.  Abdominal:     General: Abdomen is protuberant. Bowel sounds are normal.     Tenderness: There is generalized abdominal tenderness. There is guarding. Negative signs include Murphy's sign and McBurney's sign.  Musculoskeletal:        General: No swelling. Normal range of motion.     Cervical back: Neck  supple.  Skin:    General: Skin is warm and dry.  Neurological:     General: No focal deficit present.     Mental Status: She is alert.  Psychiatric:        Mood and Affect: Mood normal.     ED Results / Procedures / Treatments   EKG None  Procedures Procedures  Medications Ordered in the ED Medications  HYDROmorphone  (DILAUDID ) injection 1 mg (1 mg Intravenous Given 05/25/24 0543)  ondansetron  (ZOFRAN ) injection 4 mg (4 mg Intravenous Given 05/25/24 0543)  sodium chloride  0.9 % bolus 1,000 mL (1,000 mLs Intravenous New Bag/Given 05/25/24 0542)  iohexol  (OMNIPAQUE ) 300 MG/ML solution 75 mL (75 mLs Intravenous Contrast Given 05/25/24 0635)  ondansetron  (ZOFRAN ) injection 4 mg (4 mg Intravenous Given 05/25/24 9371)    Initial Impression and Plan  Patient here with diffuse abdominal pain/tenderness, she had a BM a few hours ago without improvement. Concern for viscous perf or SBO by exam. Will check labs and send for CT. Pain/nausea meds for comfort.   ED Course   Clinical Course as of 05/25/24 0652  Wed May 25, 2024  0605 CBC is normal. UA with some signs of infection but not having any acute urinary symptoms.  [CS]  0616 CMP and lipase are unremarkable.  [CS]  0626 Pain improved but now vomiting. Will give a second dose of zofran .  [CS]  [phone] Care of the patient will be signed out pending CT results.  [CS]    Clinical Course User Index [CS] Roselyn Carlin NOVAK, MD     MDM Rules/Calculators/A&P Medical Decision Making Problems Addressed: Generalized abdominal pain: acute illness or injury  Amount and/or Complexity of Data Reviewed Labs: ordered. Decision-making details documented in ED Course. Radiology: ordered and independent interpretation performed. Decision-making details documented in ED Course.  Risk Prescription drug management. Parenteral controlled substances.     Final Clinical Impression(s) / ED Diagnoses Final diagnoses:  Generalized abdominal pain     Rx / DC Orders ED Discharge Orders     None        Roselyn Carlin NOVAK, MD 05/25/24 [phone]  "

## 2024-05-25 NOTE — ED Notes (Signed)
 Pt given water  per request

## 2024-05-25 NOTE — ED Notes (Signed)
 Notified AC of need for doxycycline  IVP
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
## 2024-05-25 NOTE — ED Triage Notes (Signed)
 Pt c/o abdominal pain since 10pm last night. Pain is in center of belly- pain 10/10. Described pain as constant. Pt has lung cancer.
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
## 2024-05-25 NOTE — ED Notes (Signed)
 Pt is currently feeling nauseous, dr, made aware.

## 2024-05-26 ENCOUNTER — Inpatient Hospital Stay (HOSPITAL_COMMUNITY)

## 2024-05-26 ENCOUNTER — Observation Stay (HOSPITAL_COMMUNITY)

## 2024-05-26 ENCOUNTER — Encounter (HOSPITAL_COMMUNITY): Payer: Self-pay | Admitting: Family Medicine

## 2024-05-26 LAB — COMPREHENSIVE METABOLIC PANEL WITH GFR
ALT: 15 U/L (ref 0–44)
AST: 19 U/L (ref 15–41)
Albumin: 3.6 g/dL (ref 3.5–5.0)
Alkaline Phosphatase: 63 U/L (ref 38–126)
Anion gap: 7 (ref 5–15)
BUN: 19 mg/dL (ref 8–23)
CO2: 28 mmol/L (ref 22–32)
Calcium: 9.4 mg/dL (ref 8.9–10.3)
Chloride: 107 mmol/L (ref 98–111)
Creatinine, Ser: 1.12 mg/dL — ABNORMAL HIGH (ref 0.44–1.00)
GFR, Estimated: 54 mL/min — ABNORMAL LOW
Glucose, Bld: 83 mg/dL (ref 70–99)
Potassium: 4.4 mmol/L (ref 3.5–5.1)
Sodium: 141 mmol/L (ref 135–145)
Total Bilirubin: 0.5 mg/dL (ref 0.0–1.2)
Total Protein: 6.1 g/dL — ABNORMAL LOW (ref 6.5–8.1)

## 2024-05-26 LAB — CBC
HCT: 38.5 % (ref 36.0–46.0)
Hemoglobin: 12.5 g/dL (ref 12.0–15.0)
MCH: 32.1 pg (ref 26.0–34.0)
MCHC: 32.5 g/dL (ref 30.0–36.0)
MCV: 98.7 fL (ref 80.0–100.0)
Platelets: 178 10*3/uL (ref 150–400)
RBC: 3.9 MIL/uL (ref 3.87–5.11)
RDW: 13.8 % (ref 11.5–15.5)
WBC: 6.8 10*3/uL (ref 4.0–10.5)
nRBC: 0 % (ref 0.0–0.2)

## 2024-05-26 LAB — HIV ANTIBODY (ROUTINE TESTING W REFLEX): HIV Screen 4th Generation wRfx: NONREACTIVE

## 2024-05-26 MED ORDER — LACTATED RINGERS IV SOLN: INTRAVENOUS | Status: AC

## 2024-05-26 MED ORDER — SODIUM CHLORIDE 0.9% FLUSH: 10.0000 mL | INTRAVENOUS | Status: DC | PRN

## 2024-05-26 MED ORDER — CHLORHEXIDINE GLUCONATE CLOTH 2 % EX PADS
6.0000 | MEDICATED_PAD | Freq: Every day | CUTANEOUS | Status: DC
  Administered 2024-05-26: 6 via TOPICAL

## 2024-05-26 MED ORDER — SODIUM CHLORIDE 0.9% FLUSH
10.0000 mL | Freq: Two times a day (BID) | INTRAVENOUS | Status: DC
  Administered 2024-05-26 (×2): 10 mL
  Administered 2024-05-27: 20 mL

## 2024-05-26 MED ORDER — DIATRIZOATE MEGLUMINE & SODIUM 66-10 % PO SOLN
90.0000 mL | Freq: Once | ORAL | Status: AC
  Administered 2024-05-26: 90 mL via NASOGASTRIC

## 2024-05-26 NOTE — Progress Notes (Signed)
 "   Subjective: Patient has not had any further episodes of nausea or vomiting.  She still has upper abdominal distention and discomfort, though her pain is less.  She is passing gas but has not had a significant bowel movement.  Objective: Vital signs in last 24 hours: Temp:  [97.6 F (36.4 C)-98 F (36.7 C)] 98 F (36.7 C) (02/05 0158) Pulse Rate:  [63-73] 71 (02/05 0158) Resp:  [17-18] 17 (02/05 0158) BP: (97-131)/(63-78) 97/63 (02/05 0158) SpO2:  [96 %-100 %] 96 % (02/05 0158)    Intake/Output from previous day: No intake/output data recorded. Intake/Output this shift: No intake/output data recorded.  General appearance: alert, cooperative, and no distress GI: Soft with some discomfort noted in the upper abdomen.  No rigidity is noted.  Minimal bowel sounds appreciated.  Mildly distended.  Lab Results:  Recent Labs    05/25/24 0530 05/26/24 0522  WBC 9.3 6.8  HGB 13.3 12.5  HCT 40.3 38.5  PLT 203 178   BMET Recent Labs    05/25/24 0530 05/26/24 0522  NA 140 141  K 4.3 4.4  CL 101 107  CO2 27 28  GLUCOSE 115* 83  BUN 25* 19  CREATININE 1.22* 1.12*  CALCIUM 9.9 9.4   PT/INR No results for input(s): LABPROT, INR in the last 72 hours.  Studies/Results: CT ABDOMEN PELVIS W CONTRAST Result Date: 05/25/2024 CLINICAL DATA:  Abdominal pain.  History of lung cancer. EXAM: CT ABDOMEN AND PELVIS WITH CONTRAST TECHNIQUE: Multidetector CT imaging of the abdomen and pelvis was performed using the standard protocol following bolus administration of intravenous contrast. RADIATION DOSE REDUCTION: This exam was performed according to the departmental dose-optimization program which includes automated exposure control, adjustment of the mA and/or kV according to patient size and/or use of iterative reconstruction technique. CONTRAST:  75mL OMNIPAQUE  IOHEXOL  300 MG/ML  SOLN COMPARISON:  03/31/2024 FINDINGS: Lower chest: No acute findings. Hepatobiliary: No suspicious focal  abnormality within the liver parenchyma. There is no evidence for gallstones, gallbladder wall thickening, or pericholecystic fluid. No intrahepatic or extrahepatic biliary dilation. Pancreas: No focal mass lesion. No dilatation of the main duct. No intraparenchymal cyst. No peripancreatic edema. Spleen: No splenomegaly. No suspicious focal mass lesion. Adrenals/Urinary Tract: No adrenal nodule or mass. Kidneys unremarkable. No evidence for hydroureter. Bladder is decompressed. Stomach/Bowel: Stomach is unremarkable. No gastric wall thickening. No evidence of outlet obstruction. Duodenum is normally positioned as is the ligament of Treitz. Proximal jejunum is nondilated. Mid small bowel is fluid-filled and distended up to 2.9 cm diameter. Small bowel in the anterior pelvis shows fecalization of enteric contents and then gradually tapers into completely decompressed small bowel in the region of the distal ileum. Multiple scattered areas of perienteric and interloop mesenteric fluid evident around the dilated small bowel loops. No small bowel wall thickening or pneumatosis. Terminal ileum is completely decompressed. The appendix is normal. 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 Trace diverticular disease noted in the sigmoid segment. Colon is largely decompressed. Vascular/Lymphatic: There is moderate atherosclerotic calcification of the abdominal aorta without aneurysm. Portal vein and superior mesenteric vein are patent. Celiac axis, SMA, and IMA are patent. There is no gastrohepatic or hepatoduodenal ligament lymphadenopathy. No retroperitoneal or mesenteric lymphadenopathy. No pelvic sidewall lymphadenopathy. Reproductive: Hysterectomy.  There is no adnexal mass. Other: No substantial intraperitoneal free fluid. Musculoskeletal: No worrisome lytic or sclerotic osseous abnormality. IMPRESSION: 1. Mid small bowel is fluid-filled and distended up to 2.9 cm diameter. Small bowel in  the anterior pelvis  shows fecalization of enteric contents and then gradually tapers into completely decompressed small bowel in the region of the distal ileum. Multiple scattered areas of perienteric and interloop mesenteric fluid evident around the dilated small bowel loops. No small bowel wall thickening or pneumatosis. Imaging features raise concern for evolving for small-bowel obstruction. Chronic noninflammatory stricture would be a consideration as well. 2. 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 3. Trace diverticular disease in the sigmoid segment. 4.  Aortic Atherosclerosis (ICD10-I70.0). Electronically Signed   By: Camellia Candle M.D.   On: 05/25/2024 07:04    Anti-infectives: Anti-infectives (From admission, onward)    Start     Dose/Rate Route Frequency Ordered Stop   05/25/24 2000  doxycycline  (VIBRAMYCIN ) 100 mg in sodium chloride  0.9 % 250 mL IVPB        100 mg 125 mL/hr over 120 Minutes Intravenous Every 12 hours 05/25/24 1935         Assessment/Plan: Impression: Small bowel obstruction, though nausea and vomiting have subsided.  Is passing gas. Plan: Will get small bowel obstruction protocol study today.  Further management is pending those results.  LOS: 0 days    Oneil Budge 05/26/2024  "

## 2024-05-26 NOTE — ED Notes (Signed)
Pt reports she was able to have a BM.

## 2024-05-26 NOTE — Progress Notes (Signed)
 Went to start patient's ordered Breztri  inhaler. Patient stated she just saw her pulmonologist last week and is not due to see him again for a year. At this visit patient told her pulmonologist that she wasn't taking her Breztri  every day and the doctor was ok with this. Patient stated to this RT that she has her home med if needed and did not want to be charged for the inhaler in hospital since she wasn't even taking it regularly. This RT told patient to call if she needed RT services and her Breztri  order was made PRN. Inhaler that was pulled from [facility] was returned as to not charge patient.

## 2024-05-26 NOTE — Progress Notes (Signed)
 Small bowel obstruction protocol view at 8 hours shows contrast in colon with some residual small bowel dilatation.  Patient has had BM's today.  Will advance to soft diet.  May be discharged in am if she tolerates this diet.  No need for surgical intervention at this time.

## 2024-05-26 NOTE — Progress Notes (Signed)
 [facility] PROGRESS NOTE  Yvette Curry (DOB: 1956/02/24) FMW:[id number] PCP: Cook, [name], DO  Brief Narrative: Yvette Curry is a 69 y.o. female with a history of stage IV lung adenocarcinoma (currently in remission, last chemotherapy 12/10/2023), congestive heart failure with preserved ejection fraction (EF 55%), and emphysema who presented to the emergency department with several hours of worsening diffuse cramping abdominal pain associated with nausea and bloating. CT abd/pelvis suggested small bowel obstruction for which surgery was consulted and hospitalists paged for admission.   Subjective: Remains NPO with sips of clears, hadn't had BM this morning but +flatus. Her belly still feels distended and she has significant pain. No vomiting.   Objective: BP 131/78 (BP Location: Right Arm)   Pulse 81   Temp 97.7 F (36.5 C) (Oral)   Resp 20   Ht 5' 1 (1.549 m)   Wt 74.8 kg   SpO2 96%   BMI 31.16 kg/m   Gen: No distress Pulm: Clear, nonlabored  CV: RRR, no MRG GI: Soft but distended and tender without rebound, +BS.  Neuro: Alert and oriented. No new focal deficits. Ext: Warm, no deformities. Skin: No rashes, lesions or ulcers on visualized skin   Assessment & Plan: Partial small bowel obstruction: Unknown etiology, favored to be adhesive s/p remote hysterectomy. Pt has reported hx left hemi-abdominal metastatic findings though this was not seen on current or recent imaging s/p treatment. Stricture possible.  - Exam not peritonitic but still distended: Appreciate comanagement w/Dr. Mavis. Sips of clears, will perform gastrografin  protocol. XR personally reviewed this AM showed no severely distended bowel.  - Continue IVF, IV antiemetics, IV analgesics prn. If N/V develop, would drop NGT    Stage IV lung adenocarcinoma, currently in remission: Last chemotherapy 12/10/2023. Recent surveillance imaging 03/31/2024 showed improved disease with no evidence of active  malignancy and no intra-abdominal metastases.  - Continue outpatient oncology follow-up.   Chronic HFpEF: Recent echo 03/2024 shows EF 55% with mild anterior/anteroseptal hypokinesis and mild-to-moderate aortic regurgitation. Patient hemodynamically stable with BP 129/67.  - Currently not on GDMT due to history of hypotension.  - Cardiac PET pending as outpatient on 3/3 to evaluate for ischemia.  - Continue current cardiac medications.  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  - Continue home inhalers (Breztri , albuterol  PRN).    Pyuria: Asymptomatic. No Tx indicated.     Left great toe paronychia: Improving.  - Changed doxycycline  po outpt med to IV for now.    Concern for inflammatory arthritis: Recently saw rheumatology who decided empiric Tx could be started. Pt/family thinks leflunomide though they haven't started yet.  - Defer initiation of Tx to outpatient, symptoms relatively well controlled at present.   Bernardino KATHEE Come, MD [facility] [email or web address] 05/26/2024, 4:14 PM

## 2024-05-26 NOTE — Progress Notes (Signed)
" °   05/26/24 1029  TOC Brief Assessment  Insurance and Status Reviewed  Patient has primary care physician Yes  Home environment has been reviewed Home with spouse  Prior level of function: Independent  Prior/Current Home Services No current home services  Social Drivers of Health Review SDOH reviewed no interventions necessary  Readmission risk has been reviewed Yes  Transition of care needs no transition of care needs at this time   Inpatient Care Manager (ICM) has reviewed patient and no ICM needs have been identified at this time. We will continue to monitor patient advancement through interdisciplinary progression rounds. If new patient transition needs arise, please place a ICM consult.   "

## 2024-05-26 NOTE — ED Notes (Signed)
Pt ambulatory to bathroom and back to room 

## 2024-05-27 LAB — BASIC METABOLIC PANEL WITH GFR
Anion gap: 8 (ref 5–15)
BUN: 24 mg/dL — ABNORMAL HIGH (ref 8–23)
CO2: 29 mmol/L (ref 22–32)
Calcium: 9.2 mg/dL (ref 8.9–10.3)
Chloride: 107 mmol/L (ref 98–111)
Creatinine, Ser: 1.32 mg/dL — ABNORMAL HIGH (ref 0.44–1.00)
GFR, Estimated: 44 mL/min — ABNORMAL LOW
Glucose, Bld: 91 mg/dL (ref 70–99)
Potassium: 4.4 mmol/L (ref 3.5–5.1)
Sodium: 145 mmol/L (ref 135–145)

## 2024-05-27 MED ORDER — ONDANSETRON 4 MG PO TBDP: 4.0000 mg | ORAL_TABLET | Freq: Three times a day (TID) | ORAL | 0 refills | Status: AC | PRN

## 2024-05-27 MED ORDER — PANTOPRAZOLE SODIUM 40 MG PO TBEC: 40.0000 mg | DELAYED_RELEASE_TABLET | Freq: Every day | ORAL | 1 refills | Status: AC

## 2024-05-27 MED ORDER — ALBUTEROL SULFATE HFA 108 (90 BASE) MCG/ACT IN AERS: 2.0000 | INHALATION_SPRAY | Freq: Four times a day (QID) | RESPIRATORY_TRACT | 1 refills | Status: AC | PRN

## 2024-05-27 MED ORDER — HEPARIN SOD (PORK) LOCK FLUSH 100 UNIT/ML IV SOLN
500.0000 [IU] | Freq: Once | INTRAVENOUS | Status: AC
  Administered 2024-05-27: 500 [IU] via INTRAVENOUS
  Filled 2024-05-27: qty 5
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
## 2024-05-27 NOTE — Progress Notes (Signed)
"   °  Subjective: Patient has tolerated soft diet.  Has had 3 bowel movements overnight.  Has minimal abdominal cramping.  Objective: Vital signs in last 24 hours: Temp:  [97.7 F (36.5 C)-98.2 F (36.8 C)] 97.9 F (36.6 C) (02/06 0235) Pulse Rate:  [78-92] 81 (02/06 0235) Resp:  [16-20] 18 (02/06 0235) BP: (121-131)/(73-92) 125/73 (02/06 0235) SpO2:  [93 %-100 %] 93 % (02/06 0235) Weight:  [74.8 kg] 74.8 kg (02/05 1343) Last BM Date : 05/27/24  Intake/Output from previous day: 02/05 0701 - 02/06 0700 In: 1180 [P.O.:680; IV Piggyback:500] Out: -  Intake/Output this shift: No intake/output data recorded.  General appearance: alert, cooperative, and no distress GI: soft, non-tender; bowel sounds normal; no masses,  no organomegaly  Lab Results:  Recent Labs    05/25/24 0530 05/26/24 0522  WBC 9.3 6.8  HGB 13.3 12.5  HCT 40.3 38.5  PLT 203 178   BMET Recent Labs    05/26/24 0522 05/27/24 0414  NA 141 145  K 4.4 4.4  CL 107 107  CO2 28 29  GLUCOSE 83 91  BUN 19 24*  CREATININE 1.12* 1.32*  CALCIUM 9.4 9.2   PT/INR No results for input(s): LABPROT, INR in the last 72 hours.  Studies/Results: DG Abd Portable 1V-Small Bowel Obstruction Protocol-initial, 8 hr delay Result Date: 05/26/2024 CLINICAL DATA:  Small bowel obstruction, 8 hour delay. EXAM: PORTABLE ABDOMEN - 1 VIEW COMPARISON:  Radiograph and CT yesterday FINDINGS: Administered enteric contrast is seen within the ascending, transverse, descending and sigmoid colon. Persisting gaseous small bowel distension centrally measuring up to 3.5 cm. No evidence of free air. IMPRESSION: Administered enteric contrast is seen within the colon. Persisting gaseous small bowel distension centrally measuring up to 3.5 cm. Findings are suggestive of partial or resolving small bowel obstruction. Electronically Signed   By: Andrea Gasman M.D.   On: 05/26/2024 16:38   DG Abd Portable 1V-Small Bowel Protocol-Position  Verification Result Date: 05/26/2024 CLINICAL DATA:  Possible small bowel obstruction. EXAM: PORTABLE ABDOMEN - 1 VIEW COMPARISON:  November 28, 2021 FINDINGS: The bowel gas pattern is normal. No radio-opaque calculi or other significant radiographic abnormality are seen. IMPRESSION: Negative. Electronically Signed   By: Lynwood Landy Raddle M.D.   On: 05/26/2024 08:40    Anti-infectives: Anti-infectives (From admission, onward)    Start     Dose/Rate Route Frequency Ordered Stop   05/25/24 2000  doxycycline  (VIBRAMYCIN ) 100 mg in sodium chloride  0.9 % 250 mL IVPB        100 mg 125 mL/hr over 120 Minutes Intravenous Every 12 hours 05/25/24 1935         Assessment/Plan: Impression: Partial small bowel obstruction resolving.  No need for surgical intervention at the present time. Plan: Okay for discharge from surgery standpoint.  LOS: 1 day    Oneil Budge 05/27/2024  "

## 2024-05-27 NOTE — Discharge Instructions (Addendum)
 1)The 'BRAT' diet is suggested, then progress to diet as tolerated as symptoms abate----soft diet advised -- BRAT (bananas, rice, apples, toast) -you may also consume other mild foods that ease the GI tract such as saltines, oatmeal, or boiled potatoes. Call if bloody stools, persistent diarrhea, vomiting, fever or abdominal pain or if no bowel movement in over 48 hours  2)Avoid ibuprofen/Advil/Aleve/Motrin/Goody Powders/Naproxen/BC powders/Meloxicam/Diclofenac/Indomethacin and other Nonsteroidal anti-inflammatory medications as these will make you more likely to bleed and can cause stomach ulcers, can also cause Kidney problems.   3) repeat BMP blood test in about a week or so

## 2024-05-27 NOTE — Plan of Care (Signed)
  Problem: Education: Goal: Knowledge of General Education information will improve Description: Including pain rating scale, medication(s)/side effects and non-pharmacologic comfort measures Outcome: Progressing   Problem: Clinical Measurements: Goal: Ability to maintain clinical measurements within normal limits will improve Outcome: Progressing   Problem: Nutrition: Goal: Adequate nutrition will be maintained Outcome: Progressing   Problem: Coping: Goal: Level of anxiety will decrease Outcome: Progressing   Problem: Elimination: Goal: Will not experience complications related to bowel motility Outcome: Progressing   Problem: Pain Managment: Goal: General experience of comfort will improve and/or be controlled Outcome: Progressing   Problem: Safety: Goal: Ability to remain free from injury will improve Outcome: Progressing

## 2024-06-02 ENCOUNTER — Inpatient Hospital Stay: Admitting: Family Medicine

## 2024-06-03 ENCOUNTER — Encounter (INDEPENDENT_AMBULATORY_CARE_PROVIDER_SITE_OTHER): Admitting: Ophthalmology

## 2024-06-21 ENCOUNTER — Other Ambulatory Visit (HOSPITAL_COMMUNITY)

## 2024-06-24 ENCOUNTER — Ambulatory Visit: Payer: Medicare Other

## 2024-07-04 ENCOUNTER — Ambulatory Visit (HOSPITAL_COMMUNITY)

## 2024-07-04 ENCOUNTER — Inpatient Hospital Stay

## 2024-07-11 ENCOUNTER — Inpatient Hospital Stay: Attending: Internal Medicine

## 2024-07-11 ENCOUNTER — Inpatient Hospital Stay: Admitting: Physician Assistant

## 2024-08-02 ENCOUNTER — Ambulatory Visit: Admitting: Physician Assistant

## 2024-08-16 ENCOUNTER — Ambulatory Visit: Admitting: Family Medicine

## 2024-10-11 ENCOUNTER — Other Ambulatory Visit
